# Patient Record
Sex: Male | Born: 1935 | Race: Black or African American | Hispanic: No | Smoking: Former smoker
Health system: Southern US, Community
[De-identification: ages and names within clinical notes are randomized; demographics above are authoritative.]

## PROBLEM LIST (undated history)

## (undated) DIAGNOSIS — K649 Unspecified hemorrhoids: Secondary | ICD-10-CM

## (undated) DIAGNOSIS — I471 Supraventricular tachycardia, unspecified: Secondary | ICD-10-CM

## (undated) DIAGNOSIS — E079 Disorder of thyroid, unspecified: Secondary | ICD-10-CM

## (undated) DIAGNOSIS — K219 Gastro-esophageal reflux disease without esophagitis: Secondary | ICD-10-CM

## (undated) DIAGNOSIS — M1990 Unspecified osteoarthritis, unspecified site: Secondary | ICD-10-CM

## (undated) DIAGNOSIS — I10 Essential (primary) hypertension: Secondary | ICD-10-CM

## (undated) DIAGNOSIS — E785 Hyperlipidemia, unspecified: Secondary | ICD-10-CM

## (undated) DIAGNOSIS — R42 Dizziness and giddiness: Secondary | ICD-10-CM

## (undated) HISTORY — DX: Supraventricular tachycardia, unspecified: I47.10

## (undated) HISTORY — DX: Essential (primary) hypertension: I10

## (undated) HISTORY — DX: Unspecified osteoarthritis, unspecified site: M19.90

## (undated) HISTORY — DX: Hyperlipidemia, unspecified: E78.5

## (undated) HISTORY — DX: Supraventricular tachycardia: I47.1

## (undated) HISTORY — DX: Dizziness and giddiness: R42

## (undated) HISTORY — PX: EXCISIONAL HEMORRHOIDECTOMY: SHX1541

## (undated) HISTORY — DX: Disorder of thyroid, unspecified: E07.9

## (undated) HISTORY — PX: BACK SURGERY: SHX140

## (undated) HISTORY — DX: Unspecified hemorrhoids: K64.9

## (undated) HISTORY — DX: Gastro-esophageal reflux disease without esophagitis: K21.9

---

## 1966-12-29 HISTORY — PX: APPENDECTOMY: SHX54

## 1982-12-29 HISTORY — PX: SPINE SURGERY: SHX786

## 1984-12-29 DIAGNOSIS — K649 Unspecified hemorrhoids: Secondary | ICD-10-CM

## 1984-12-29 HISTORY — DX: Unspecified hemorrhoids: K64.9

## 2003-11-20 ENCOUNTER — Other Ambulatory Visit: Payer: Self-pay

## 2004-01-12 ENCOUNTER — Encounter: Admission: RE | Admit: 2004-01-12 | Discharge: 2004-01-12 | Payer: Self-pay | Admitting: Neurosurgery

## 2007-01-26 ENCOUNTER — Ambulatory Visit: Payer: Self-pay | Admitting: Urology

## 2007-02-02 ENCOUNTER — Ambulatory Visit: Payer: Self-pay | Admitting: Urology

## 2007-03-11 ENCOUNTER — Ambulatory Visit: Payer: Self-pay | Admitting: Urology

## 2009-04-20 ENCOUNTER — Ambulatory Visit: Payer: Self-pay | Admitting: Internal Medicine

## 2010-09-24 ENCOUNTER — Ambulatory Visit: Payer: Self-pay | Admitting: Internal Medicine

## 2011-08-01 ENCOUNTER — Emergency Department: Payer: Self-pay | Admitting: Emergency Medicine

## 2014-05-03 ENCOUNTER — Encounter: Payer: Self-pay | Admitting: General Surgery

## 2014-05-10 ENCOUNTER — Emergency Department: Payer: Self-pay | Admitting: Emergency Medicine

## 2014-05-10 LAB — CBC WITH DIFFERENTIAL/PLATELET
Basophil #: 0.1 10*3/uL (ref 0.0–0.1)
Basophil %: 0.6 %
Eosinophil #: 0.1 10*3/uL (ref 0.0–0.7)
Eosinophil %: 0.8 %
HCT: 36.6 % — ABNORMAL LOW (ref 40.0–52.0)
HGB: 12 g/dL — ABNORMAL LOW (ref 13.0–18.0)
Lymphocyte #: 2 10*3/uL (ref 1.0–3.6)
Lymphocyte %: 18.9 %
MCH: 29.2 pg (ref 26.0–34.0)
MCHC: 32.7 g/dL (ref 32.0–36.0)
MCV: 89 fL (ref 80–100)
Monocyte #: 0.8 x10 3/mm (ref 0.2–1.0)
Monocyte %: 7.7 %
Neutrophil #: 7.5 10*3/uL — ABNORMAL HIGH (ref 1.4–6.5)
Neutrophil %: 72 %
Platelet: 136 10*3/uL — ABNORMAL LOW (ref 150–440)
RBC: 4.1 10*6/uL — ABNORMAL LOW (ref 4.40–5.90)
RDW: 13.4 % (ref 11.5–14.5)
WBC: 10.3 10*3/uL (ref 3.8–10.6)

## 2014-05-10 LAB — BASIC METABOLIC PANEL
Anion Gap: 8 (ref 7–16)
BUN: 30 mg/dL — ABNORMAL HIGH (ref 7–18)
Calcium, Total: 8.7 mg/dL (ref 8.5–10.1)
Chloride: 109 mmol/L — ABNORMAL HIGH (ref 98–107)
Co2: 23 mmol/L (ref 21–32)
Creatinine: 2.39 mg/dL — ABNORMAL HIGH (ref 0.60–1.30)
EGFR (African American): 28 — ABNORMAL LOW
EGFR (Non-African Amer.): 24 — ABNORMAL LOW
Glucose: 120 mg/dL — ABNORMAL HIGH (ref 65–99)
Osmolality: 287 (ref 275–301)
Potassium: 4.6 mmol/L (ref 3.5–5.1)
Sodium: 140 mmol/L (ref 136–145)

## 2014-05-30 ENCOUNTER — Encounter: Payer: Self-pay | Admitting: General Surgery

## 2014-05-30 ENCOUNTER — Ambulatory Visit (INDEPENDENT_AMBULATORY_CARE_PROVIDER_SITE_OTHER): Payer: Commercial Managed Care - HMO | Admitting: General Surgery

## 2014-05-30 VITALS — BP 174/84 | HR 80 | Resp 14 | Ht 73.0 in | Wt 183.0 lb

## 2014-05-30 DIAGNOSIS — K4090 Unilateral inguinal hernia, without obstruction or gangrene, not specified as recurrent: Secondary | ICD-10-CM

## 2014-05-30 NOTE — Progress Notes (Signed)
Patient ID: Glenn Ehrich Sr., male   DOB: 1927-03-24, 80 y.o.   MRN: [id number]  Chief Complaint  Patient presents with  . Other    New Pt evaluation of inguinal hernia    HPI Glenn BOEHNKE Sr. is a 80 y.o. male here today for an evalution of a right inguinal hernia. Patient states he noticed this about 1-2 months ago. 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  He states there is a burning pain with activity increased activity. He fell about two weeks about and landed on his right leg. Was seen in the ER and no broken bones. No problems with moving his bowels. The patient is accompanied today by his daughter, Alan Mulder. She was present for the interview and post exam discussion. HPI  Past Medical History  Diagnosis Date  . Hemorrhoid 1986  . Thyroid disease   . Hyperlipidemia   . GERD (gastroesophageal reflux disease)   . Hypertension   . Arthritis   . Vertigo     Past Surgical History  Procedure Laterality Date  . Appendectomy  1967  . Spine surgery  1984  . Excisional hemorrhoidectomy      History reviewed. No pertinent family history.  Social History History  Substance Use Topics  . Smoking status: Former Smoker -- 1.00 packs/day for 30 years    Types: Cigarettes  . Smokeless tobacco: Never Used  . Alcohol Use: No    No Known Allergies  Current Outpatient Prescriptions  Medication Sig Dispense Refill  . amLODipine (NORVASC) 5 MG tablet Take 5 mg by mouth daily.      . carisoprodol (SOMA) 350 MG tablet Take 350 mg by mouth daily.      . hydrochlorothiazide (MICROZIDE) 12.5 MG capsule Take 12.5 mg by mouth daily.      Marland Kitchen losartan (COZAAR) 100 MG tablet Take 100 mg by mouth daily.      . meclizine (ANTIVERT) 12.5 MG tablet Take 12.5 mg by mouth daily.      Marland Kitchen omeprazole (PRILOSEC) 20 MG capsule Take 20 mg by mouth daily.      . piroxicam (FELDENE) 20 MG capsule Take 20 mg by mouth daily.       No  current facility-administered medications for this visit.    Review of Systems Review of Systems  Constitutional: Negative.   Respiratory: Negative.   Cardiovascular: Negative.     Blood pressure 174/84, pulse 80, resp. rate 14, height 6\' 1"  (1.854 m), weight 183 lb (83.008 kg).  Physical Exam Physical Exam  Constitutional: He is oriented to person, place, and time. He appears well-developed and well-nourished.  Eyes: Conjunctivae are normal. No scleral icterus.  Neck: Neck supple.  Cardiovascular: Normal rate, regular rhythm and normal heart sounds.   Pulmonary/Chest: Effort normal and breath sounds normal.  Abdominal: Soft. Normal appearance and bowel sounds are normal. There is no hepatomegaly. There is no tenderness. A hernia is present. Hernia confirmed positive in the right inguinal area.  Genitourinary: Testes normal and penis normal.  Neurological: He is alert and oriented to person, place, and time.  Skin: Skin is warm and dry.    Data Reviewed Laboratory studies completed through his PCP office on April 12, 2014 showed an abnormal creatinine at 2.2 with an estimated GFR of 30. Normal electrolytes. Normal liver function studies. Hemoglobin 12.7 with an MCV of 88 white blood cell count of 7000.  PCP notes of  same-day reviewed. Fullness in the right inguinal canal was noted at this time.  Assessment    Right inguinal hernia, minimally symptomatic.     Plan    This patient is incredibly healthy and active. 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 The patient assures me that he'll contact the office directly and promptly if he notices increasing swelling or pain. It is otherwise doing well will plan to get together and fall to discuss elective repair.     PCP: Lelan Pons Byrnett 05/30/2014, 8:51 PM

## 2014-05-30 NOTE — Patient Instructions (Addendum)

## 2014-10-11 ENCOUNTER — Ambulatory Visit: Payer: Commercial Managed Care - HMO | Admitting: General Surgery

## 2014-10-16 IMAGING — CT CT PELVIS W/O CM
2 of 4 series · 16 of 46 positions shown, 18 images · non-contrast
Comparison: DG PELVIS 1-2V dated 05/10/2014

CLINICAL DATA: Right hip pain after trauma.

EXAM:
CT PELVIS WITHOUT CONTRAST
TECHNIQUE: Multidetector CT imaging of the pelvis was performed following the
standard protocol without intravenous contrast.

[Series 3: pelvis soft tissue · axial · 0.72mm/px · z∈[+790,+1000]mm · 13 of 160 slices shown, 15 images]
[im 10/160  soft-tissue]
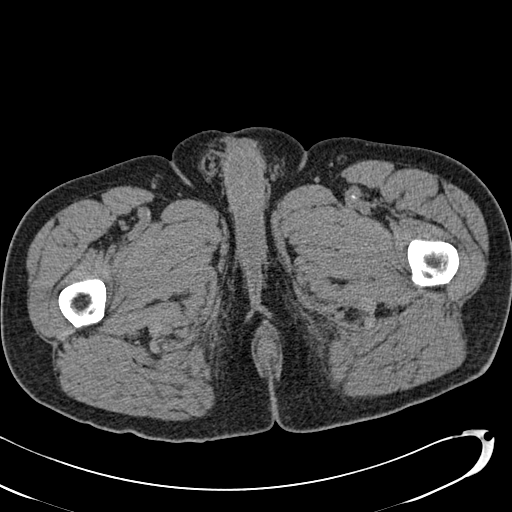
[im 10/160  bone]
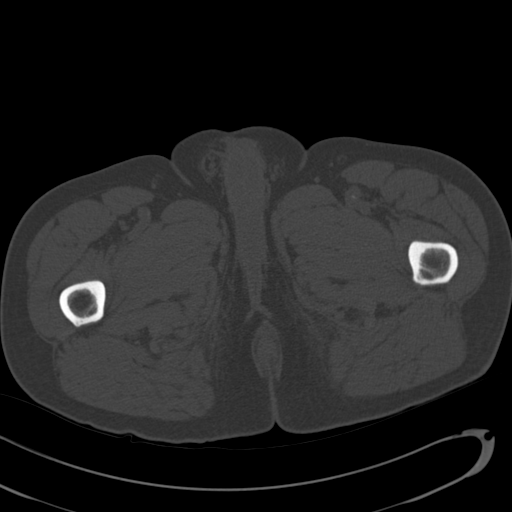
[im 19/160  soft-tissue]
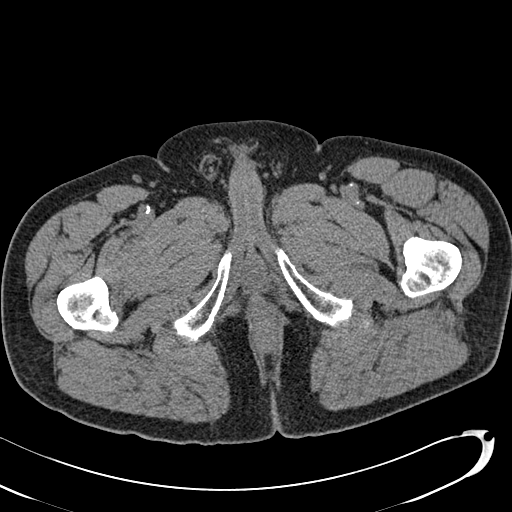
[im 38/160  soft-tissue]
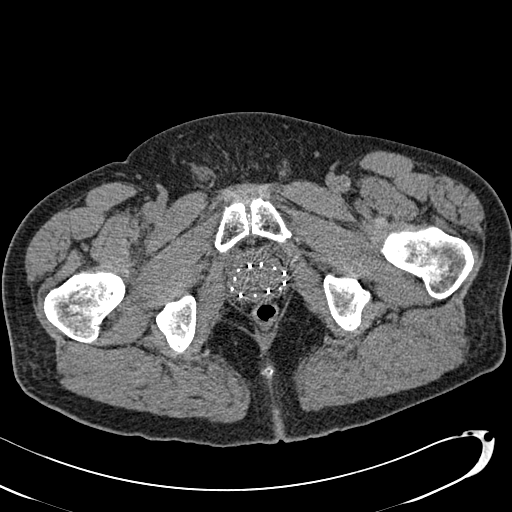
[im 47/160  soft-tissue]
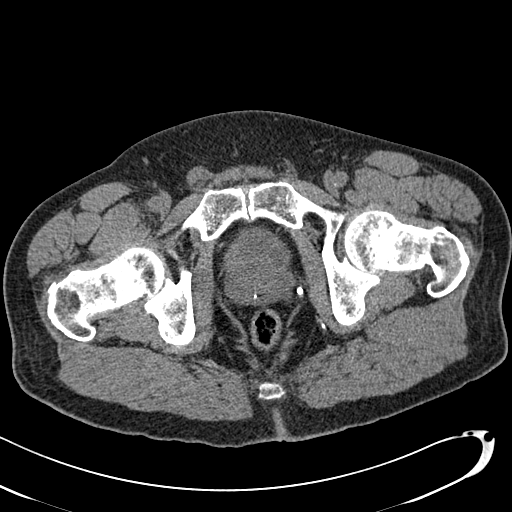
[im 57/160  soft-tissue]
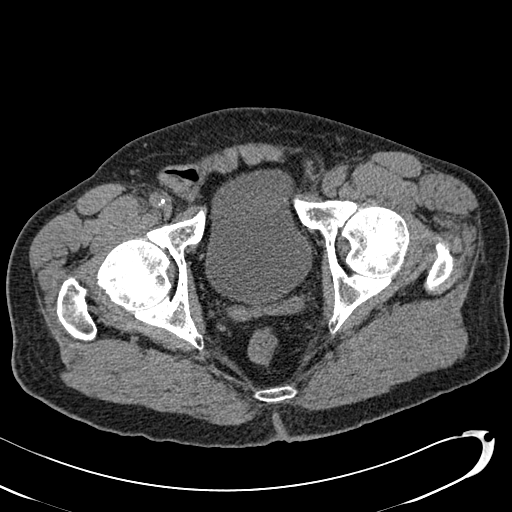
[im 66/160  soft-tissue]
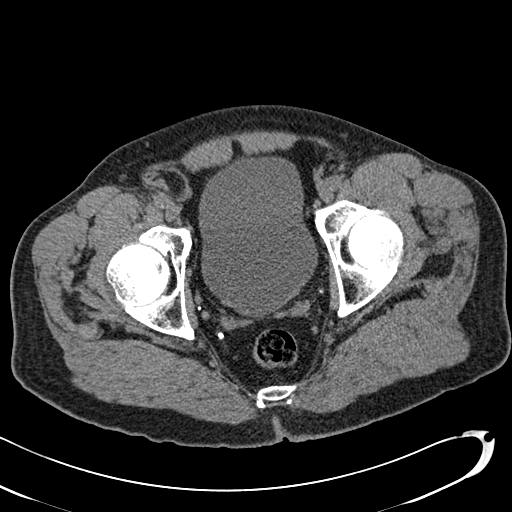
[im 85/160  soft-tissue]
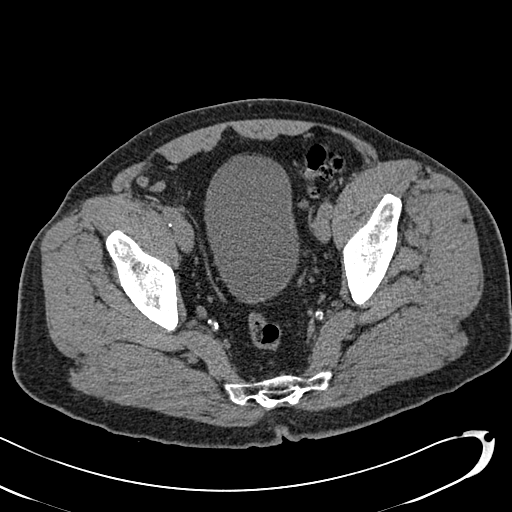
[im 94/160  soft-tissue]
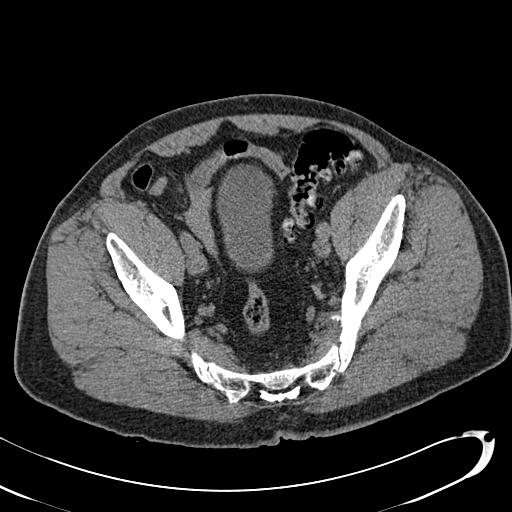
[im 103/160  soft-tissue]
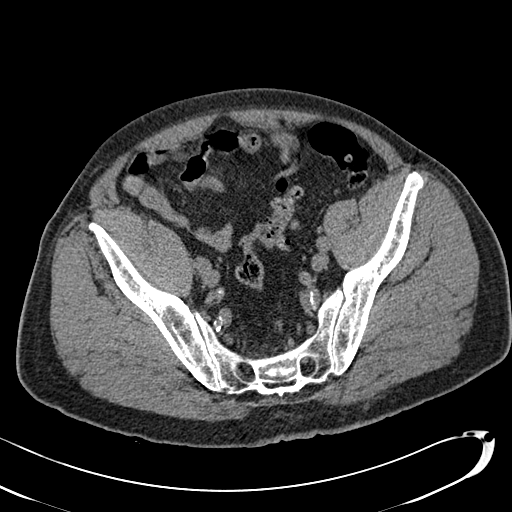
[im 103/160  bone]
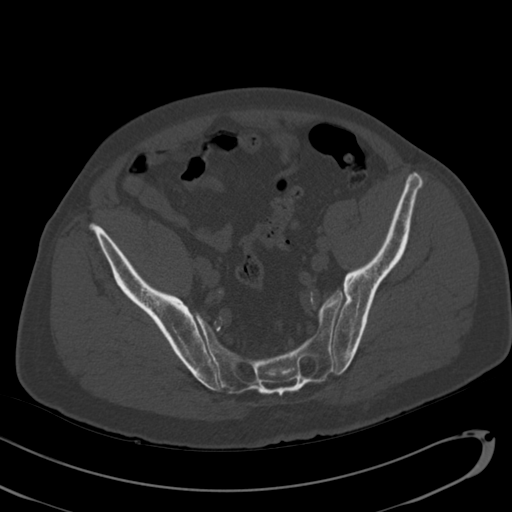
[im 113/160  soft-tissue]
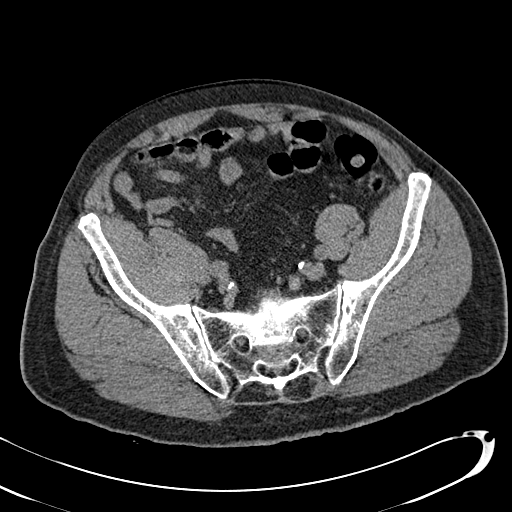
[im 122/160  soft-tissue]
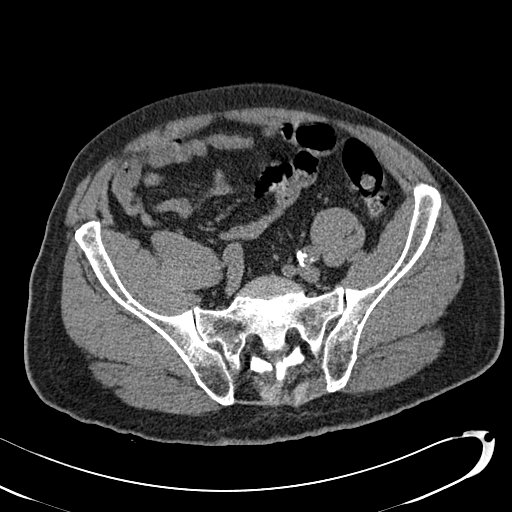
[im 141/160  soft-tissue]
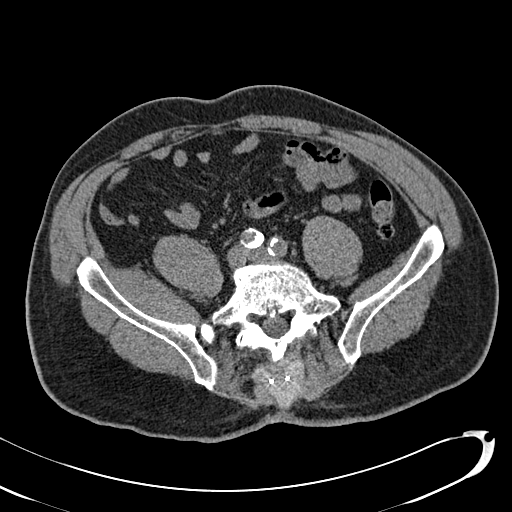
[im 150/160  soft-tissue]
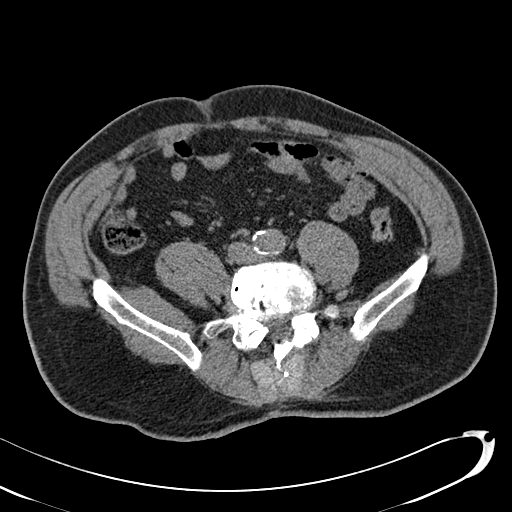

[Series 6: pevis soft tissue cor · coronal · 0.52mm/px · 3 of 123 slices shown]
[im 41/123  soft-tissue]
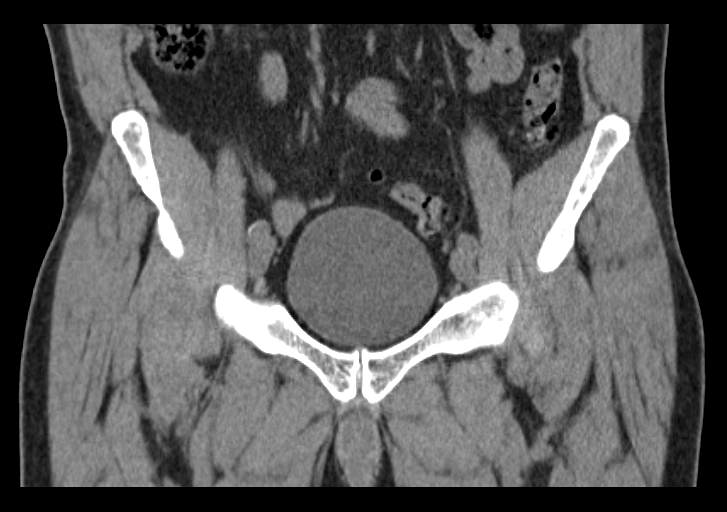
[im 55/123  soft-tissue]
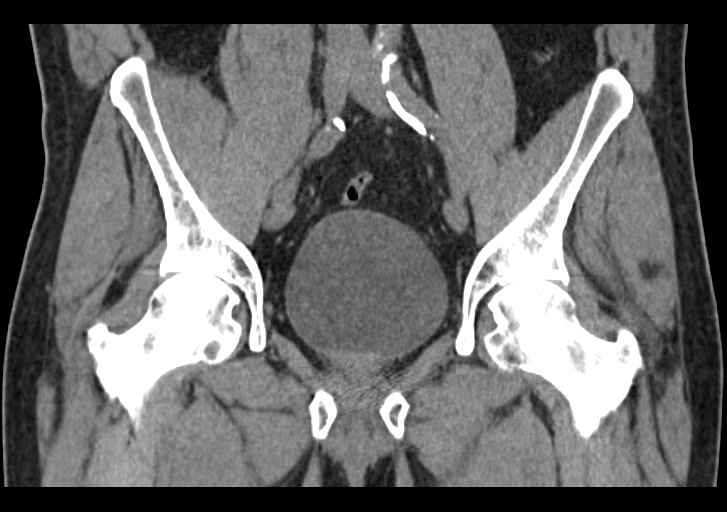
[im 68/123  soft-tissue]
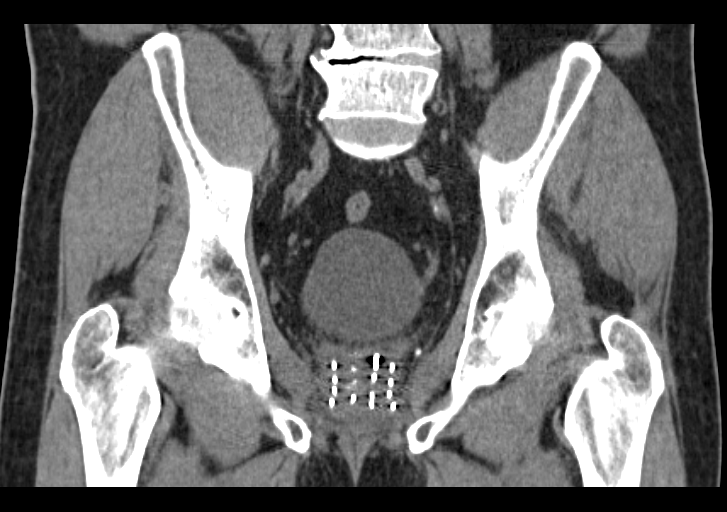

[16 of 46 positions shown; findings below may reference images not displayed]

FINDINGS: The pelvis and hips appear intact. No displaced fractures are
identified. No focal bone lesions. There is a small right inguinal
hernia containing bowel loop. No evidence of proximal bowel
distention to suggest obstruction. Bladder wall is not thickened.
Multiple foreign bodies in the prostate gland consistent with seed
implants. No significant lymphadenopathy in the pelvis. Common iliac
and external/internal iliac artery calcifications.
IMPRESSION: No evidence of acute fracture of the pelvis or hips. Right inguinal
hernia containing bowel without proximal obstruction.

## 2014-10-17 ENCOUNTER — Ambulatory Visit: Payer: Commercial Managed Care - HMO | Admitting: General Surgery

## 2014-10-31 ENCOUNTER — Encounter: Payer: Self-pay | Admitting: *Deleted

## 2015-01-18 DIAGNOSIS — I10 Essential (primary) hypertension: Secondary | ICD-10-CM | POA: Diagnosis not present

## 2015-01-18 DIAGNOSIS — N183 Chronic kidney disease, stage 3 (moderate): Secondary | ICD-10-CM | POA: Diagnosis not present

## 2015-01-18 DIAGNOSIS — R809 Proteinuria, unspecified: Secondary | ICD-10-CM | POA: Diagnosis not present

## 2015-01-29 ENCOUNTER — Ambulatory Visit: Payer: Self-pay | Admitting: Nephrology

## 2015-01-29 DIAGNOSIS — R809 Proteinuria, unspecified: Secondary | ICD-10-CM | POA: Diagnosis not present

## 2015-01-29 DIAGNOSIS — N183 Chronic kidney disease, stage 3 (moderate): Secondary | ICD-10-CM | POA: Diagnosis not present

## 2015-02-02 DIAGNOSIS — I129 Hypertensive chronic kidney disease with stage 1 through stage 4 chronic kidney disease, or unspecified chronic kidney disease: Secondary | ICD-10-CM | POA: Diagnosis not present

## 2015-02-02 DIAGNOSIS — E039 Hypothyroidism, unspecified: Secondary | ICD-10-CM | POA: Diagnosis not present

## 2015-02-02 DIAGNOSIS — N183 Chronic kidney disease, stage 3 (moderate): Secondary | ICD-10-CM | POA: Diagnosis not present

## 2015-02-02 DIAGNOSIS — M1990 Unspecified osteoarthritis, unspecified site: Secondary | ICD-10-CM | POA: Diagnosis not present

## 2015-02-06 DIAGNOSIS — N183 Chronic kidney disease, stage 3 (moderate): Secondary | ICD-10-CM | POA: Diagnosis not present

## 2015-02-06 DIAGNOSIS — I10 Essential (primary) hypertension: Secondary | ICD-10-CM | POA: Diagnosis not present

## 2015-02-06 DIAGNOSIS — D631 Anemia in chronic kidney disease: Secondary | ICD-10-CM | POA: Diagnosis not present

## 2015-02-06 DIAGNOSIS — R809 Proteinuria, unspecified: Secondary | ICD-10-CM | POA: Diagnosis not present

## 2015-06-01 ENCOUNTER — Other Ambulatory Visit: Payer: Self-pay | Admitting: Family Medicine

## 2015-06-01 DIAGNOSIS — R609 Edema, unspecified: Secondary | ICD-10-CM

## 2015-06-01 DIAGNOSIS — M1990 Unspecified osteoarthritis, unspecified site: Secondary | ICD-10-CM

## 2015-07-03 ENCOUNTER — Other Ambulatory Visit: Payer: Self-pay | Admitting: Family Medicine

## 2015-07-03 ENCOUNTER — Other Ambulatory Visit: Payer: Self-pay

## 2015-07-03 DIAGNOSIS — E039 Hypothyroidism, unspecified: Secondary | ICD-10-CM

## 2015-07-05 DIAGNOSIS — G4700 Insomnia, unspecified: Secondary | ICD-10-CM | POA: Insufficient documentation

## 2015-07-05 DIAGNOSIS — R202 Paresthesia of skin: Secondary | ICD-10-CM | POA: Insufficient documentation

## 2015-07-05 DIAGNOSIS — N183 Chronic kidney disease, stage 3 unspecified: Secondary | ICD-10-CM | POA: Insufficient documentation

## 2015-07-05 DIAGNOSIS — M109 Gout, unspecified: Secondary | ICD-10-CM | POA: Insufficient documentation

## 2015-07-05 DIAGNOSIS — M545 Low back pain, unspecified: Secondary | ICD-10-CM | POA: Insufficient documentation

## 2015-07-05 DIAGNOSIS — I10 Essential (primary) hypertension: Secondary | ICD-10-CM | POA: Insufficient documentation

## 2015-07-05 DIAGNOSIS — J309 Allergic rhinitis, unspecified: Secondary | ICD-10-CM | POA: Insufficient documentation

## 2015-07-05 DIAGNOSIS — C61 Malignant neoplasm of prostate: Secondary | ICD-10-CM | POA: Insufficient documentation

## 2015-07-05 DIAGNOSIS — M1990 Unspecified osteoarthritis, unspecified site: Secondary | ICD-10-CM | POA: Insufficient documentation

## 2015-07-05 DIAGNOSIS — E039 Hypothyroidism, unspecified: Secondary | ICD-10-CM | POA: Insufficient documentation

## 2015-07-05 DIAGNOSIS — K4090 Unilateral inguinal hernia, without obstruction or gangrene, not specified as recurrent: Secondary | ICD-10-CM | POA: Insufficient documentation

## 2015-07-05 DIAGNOSIS — R252 Cramp and spasm: Secondary | ICD-10-CM | POA: Insufficient documentation

## 2015-07-05 DIAGNOSIS — E785 Hyperlipidemia, unspecified: Secondary | ICD-10-CM | POA: Insufficient documentation

## 2015-07-05 DIAGNOSIS — K219 Gastro-esophageal reflux disease without esophagitis: Secondary | ICD-10-CM | POA: Insufficient documentation

## 2015-07-05 DIAGNOSIS — R21 Rash and other nonspecific skin eruption: Secondary | ICD-10-CM | POA: Insufficient documentation

## 2015-07-05 DIAGNOSIS — R42 Dizziness and giddiness: Secondary | ICD-10-CM | POA: Insufficient documentation

## 2015-07-13 ENCOUNTER — Ambulatory Visit: Payer: Self-pay | Admitting: Family Medicine

## 2015-07-17 ENCOUNTER — Ambulatory Visit (INDEPENDENT_AMBULATORY_CARE_PROVIDER_SITE_OTHER): Payer: Commercial Managed Care - HMO | Admitting: Family Medicine

## 2015-07-17 ENCOUNTER — Encounter: Payer: Self-pay | Admitting: Family Medicine

## 2015-07-17 VITALS — BP 126/72 | HR 72 | Temp 97.9°F | Resp 16 | Ht 70.0 in | Wt 173.0 lb

## 2015-07-17 DIAGNOSIS — R609 Edema, unspecified: Secondary | ICD-10-CM

## 2015-07-17 DIAGNOSIS — E039 Hypothyroidism, unspecified: Secondary | ICD-10-CM

## 2015-07-17 DIAGNOSIS — R0602 Shortness of breath: Secondary | ICD-10-CM

## 2015-07-17 DIAGNOSIS — R630 Anorexia: Secondary | ICD-10-CM

## 2015-07-17 NOTE — Progress Notes (Signed)
Subjective:    Patient ID: Glenn Costain., male    DOB: November 17, 1927, 81 y.o.   MRN: [id number]  Hypertension This is a chronic problem. The problem is controlled. Associated symptoms include malaise/fatigue, peripheral edema and shortness of breath (on exertion). Pertinent negatives include no anxiety, blurred vision, chest pain, headaches, neck pain, orthopnea or palpitations. There are no compliance problems.   Hypothyroidism Last TSH was checked 02/02/2015. Result was 9.940. Pt was NOT taking Synthroid at that time. Pt needs TSH rechecked today, as well a Met C for HTN with CKD III. Loss Of Appetite Pt reports he is experiencing loss of appetite, and is requesting medication to help this.   Review of Systems  Constitutional: Positive for malaise/fatigue, activity change, appetite change, fatigue and unexpected weight change. Negative for fever, chills and diaphoresis.  Eyes: Negative for blurred vision.  Respiratory: Positive for shortness of breath (on exertion).   Cardiovascular: Negative for chest pain, palpitations and orthopnea.  Musculoskeletal: Negative for neck pain.  Neurological: Negative for headaches.   BP 126/72 mmHg  Pulse 72  Temp(Src) 97.9 F (36.6 C) (Oral)  Resp 16  Ht 5' 10" (1.778 m)  Wt 173 lb (78.472 kg)  BMI 24.82 kg/m2   Patient Active Problem List   Diagnosis Date Noted  . Allergic rhinitis 07/05/2015  . Arthritis 07/05/2015  . Benign hypertension 07/05/2015  . Chronic kidney disease (CKD), stage III (moderate) 07/05/2015  . Acid reflux 07/05/2015  . Gout 07/05/2015  . HLD (hyperlipidemia) 07/05/2015  . Adult hypothyroidism 07/05/2015  . Cannot sleep 07/05/2015  . Hernia, inguinal 07/05/2015  . LBP (low back pain) 07/05/2015  . Cramp in muscle 07/05/2015  . Leg paresthesia 07/05/2015  . CA of prostate 07/05/2015  . Cutaneous eruption 07/05/2015  . Head revolving around 07/05/2015  . Hypothyroidism 07/03/2015  . Right inguinal hernia  05/30/2014   Past Medical History  Diagnosis Date  . Hemorrhoid 1986  . Thyroid disease   . Hyperlipidemia   . GERD (gastroesophageal reflux disease)   . Hypertension   . Arthritis   . Vertigo    Current Outpatient Prescriptions on File Prior to Visit  Medication Sig  . amLODipine (NORVASC) 10 MG tablet   . furosemide (LASIX) 20 MG tablet take 1 tablet by mouth once daily  . levothyroxine (SYNTHROID, LEVOTHROID) 50 MCG tablet take 1 tablet by mouth once daily  . losartan (COZAAR) 100 MG tablet Take 100 mg by mouth daily.  . piroxicam (FELDENE) 20 MG capsule   . ranitidine (ZANTAC) 150 MG tablet Take by mouth.  . carisoprodol (SOMA) 350 MG tablet Take by mouth.  . colchicine (COLCRYS) 0.6 MG tablet Take by mouth.  . triamcinolone (KENALOG) 0.025 % cream TRIAMCINOLONE ACETONIDE, 0.025% (External Cream)  1 (one) Cream Cream apply daily as needed for 0 days  Quantity: 60;  Refills: 0   Ordered :13-Apr-2014  [name] ;  Started 13-Apr-2014 Active Comments: Medication taken as needed.    No current facility-administered medications on file prior to visit.   Allergies  Allergen Reactions  . Codeine   . Cortizone-10  [Hydrocortisone]    Past Surgical History  Procedure Laterality Date  . Spine surgery  1984  . Excisional hemorrhoidectomy    . Appendectomy  1968   History   Social History  . Marital Status: Widowed    Spouse Name: N/A  . Number of Children: N/A  . Years of Education: N/A   Occupational History  .  Not on file.   Social History Main Topics  . Smoking status: Former Smoker -- 2.00 packs/day for 25 years    Quit date: 12/28/1965  . Smokeless tobacco: Never Used  . Alcohol Use: No  . Drug Use: No  . Sexual Activity: Not on file   Other Topics Concern  . Not on file   Social History Narrative   Family History  Problem Relation Age of Onset  . Alzheimer's disease Father   . Healthy Sister       Objective:   Physical Exam   Constitutional: He is oriented to person, place, and time. He appears well-developed and well-nourished.  Cardiovascular: Normal rate and regular rhythm.   Pulmonary/Chest: Effort normal and breath sounds normal.  Neurological: He is alert and oriented to person, place, and time.  Psychiatric: He has a normal mood and affect. His behavior is normal. Judgment and thought content normal.    BP 126/72 mmHg  Pulse 72  Temp(Src) 97.9 F (36.6 C) (Oral)  Resp 16  Ht 5' 10" (1.778 m)  Wt 173 lb (78.472 kg)  BMI 24.82 kg/m2     Assessment & Plan:  1. Edema Comes and goes, will check labs. Further plan pending these results.  - TSH - CBC with Differential/Platelet - Comprehensive metabolic panel  2. Shortness of breath Will check to make sure not developing heart failure. Difficult to assess.  - B Nat Peptide  3. Anorexia Family concerned. Will check pre-albumin.  - Prealbumin  4. Hypothyroidism, unspecified hypothyroidism type Stable. Will check labs.  - TSH  [name], MD

## 2015-07-18 ENCOUNTER — Telehealth: Payer: Self-pay

## 2015-07-18 DIAGNOSIS — N183 Chronic kidney disease, stage 3 unspecified: Secondary | ICD-10-CM

## 2015-07-18 LAB — CBC WITH DIFFERENTIAL/PLATELET
Basophils Absolute: 0 10*3/uL (ref 0.0–0.2)
Basos: 0 %
EOS (ABSOLUTE): 0.1 10*3/uL (ref 0.0–0.4)
Eos: 1 %
HEMATOCRIT: 37.9 % (ref 37.5–51.0)
Hemoglobin: 12.8 g/dL (ref 12.6–17.7)
Immature Grans (Abs): 0 10*3/uL (ref 0.0–0.1)
Immature Granulocytes: 0 %
Lymphocytes Absolute: 2.8 10*3/uL (ref 0.7–3.1)
Lymphs: 37 %
MCH: 29.8 pg (ref 26.6–33.0)
MCHC: 33.8 g/dL (ref 31.5–35.7)
MCV: 88 fL (ref 79–97)
MONOCYTES: 9 %
Monocytes Absolute: 0.7 10*3/uL (ref 0.1–0.9)
Neutrophils Absolute: 4 10*3/uL (ref 1.4–7.0)
Neutrophils: 53 %
Platelets: 137 10*3/uL — ABNORMAL LOW (ref 150–379)
RBC: 4.3 x10E6/uL (ref 4.14–5.80)
RDW: 13.7 % (ref 12.3–15.4)
WBC: 7.7 10*3/uL (ref 3.4–10.8)

## 2015-07-18 LAB — COMPREHENSIVE METABOLIC PANEL
ALT: 17 IU/L (ref 0–44)
AST: 24 IU/L (ref 0–40)
Albumin/Globulin Ratio: 1.3 (ref 1.1–2.5)
Albumin: 4.3 g/dL (ref 3.5–4.7)
Alkaline Phosphatase: 106 IU/L (ref 39–117)
BUN/Creatinine Ratio: 13 (ref 10–22)
BUN: 38 mg/dL — ABNORMAL HIGH (ref 8–27)
Bilirubin Total: 0.4 mg/dL (ref 0.0–1.2)
CO2: 23 mmol/L (ref 18–29)
Calcium: 9.3 mg/dL (ref 8.6–10.2)
Chloride: 99 mmol/L (ref 97–108)
Creatinine, Ser: 2.87 mg/dL — ABNORMAL HIGH (ref 0.76–1.27)
GFR calc Af Amer: 22 mL/min/{1.73_m2} — ABNORMAL LOW (ref >59)
GFR calc non Af Amer: 19 mL/min/{1.73_m2} — ABNORMAL LOW (ref >59)
Globulin, Total: 3.3 g/dL (ref 1.5–4.5)
Glucose: 94 mg/dL (ref 65–99)
POTASSIUM: 5.5 mmol/L — AB (ref 3.5–5.2)
Sodium: 137 mmol/L (ref 134–144)
Total Protein: 7.6 g/dL (ref 6.0–8.5)

## 2015-07-18 LAB — TSH: TSH: 4.45 u[IU]/mL (ref 0.450–4.500)

## 2015-07-18 LAB — BRAIN NATRIURETIC PEPTIDE: BNP: 22.7 pg/mL (ref 0.0–100.0)

## 2015-07-18 LAB — PREALBUMIN: PREALBUMIN: 28 mg/dL (ref 9–32)

## 2015-07-18 NOTE — Telephone Encounter (Signed)
-----   Message from [name], MD sent at 07/18/2015  3:28 PM EDT ----- Labs stable except that kidney function is worsening, and potassium is up.  Needs to follow up with nephrologist. Heart ok.   Labs do not show malnutrition. Please notify daughter. Thanks.

## 2015-07-18 NOTE — Telephone Encounter (Signed)
Yolanda advised as directed below.  Please refer back to Nephrology.    Thanks,   -Mickel Baas

## 2015-07-27 ENCOUNTER — Telehealth: Payer: Self-pay | Admitting: Family Medicine

## 2015-07-27 NOTE — Telephone Encounter (Signed)
Pt's daughter Reita May called because she had a missed call from our office and thought maybe Mickel Baas had tried calling her back. Thanks TNP

## 2015-07-27 NOTE — Telephone Encounter (Signed)
Left message for Glenn Jarvis to return call

## 2015-07-27 NOTE — Telephone Encounter (Signed)
Reita May called back and stated that she thinks Judson Roch tried to call not Mickel Baas. Thanks TNP

## 2015-08-01 DIAGNOSIS — I10 Essential (primary) hypertension: Secondary | ICD-10-CM | POA: Diagnosis not present

## 2015-08-01 DIAGNOSIS — N183 Chronic kidney disease, stage 3 (moderate): Secondary | ICD-10-CM | POA: Diagnosis not present

## 2015-08-01 DIAGNOSIS — D631 Anemia in chronic kidney disease: Secondary | ICD-10-CM | POA: Diagnosis not present

## 2015-08-01 DIAGNOSIS — R809 Proteinuria, unspecified: Secondary | ICD-10-CM | POA: Diagnosis not present

## 2015-08-09 DIAGNOSIS — D631 Anemia in chronic kidney disease: Secondary | ICD-10-CM | POA: Diagnosis not present

## 2015-08-09 DIAGNOSIS — N183 Chronic kidney disease, stage 3 (moderate): Secondary | ICD-10-CM | POA: Diagnosis not present

## 2015-08-09 DIAGNOSIS — R809 Proteinuria, unspecified: Secondary | ICD-10-CM | POA: Diagnosis not present

## 2015-08-09 DIAGNOSIS — I10 Essential (primary) hypertension: Secondary | ICD-10-CM | POA: Diagnosis not present

## 2015-09-11 ENCOUNTER — Other Ambulatory Visit: Payer: Self-pay

## 2015-09-11 ENCOUNTER — Telehealth: Payer: Self-pay | Admitting: Family Medicine

## 2015-09-11 DIAGNOSIS — I10 Essential (primary) hypertension: Secondary | ICD-10-CM

## 2015-09-11 DIAGNOSIS — G4700 Insomnia, unspecified: Secondary | ICD-10-CM

## 2015-09-11 MED ORDER — CARISOPRODOL 350 MG PO TABS: 350.0000 mg | ORAL_TABLET | Freq: Every day | ORAL | Status: DC

## 2015-09-11 MED ORDER — AMLODIPINE BESYLATE 10 MG PO TABS: 10.0000 mg | ORAL_TABLET | Freq: Two times a day (BID) | ORAL | Status: DC

## 2015-09-11 NOTE — Telephone Encounter (Signed)
Ok to put in order for Amlodipine 10 mg bid with 3 rf.   And Soma generic 350 every evening.  Thanks.

## 2015-09-11 NOTE — Telephone Encounter (Signed)
Glenn Jarvis with [name] would like a nurse to return his call. Clair Gulling stated that pt has had [facility] to transfer his prescriptions to [facility] and all of them came over except for a muscle relaxer. Clair Gulling stated that he didn't know the name of it but wanted to see if it is a regular prescription that doesn't have refills or if it was just temporary. Thanks TNP

## 2015-09-11 NOTE — Telephone Encounter (Signed)
Spoke with Clair Gulling pharmacist from [facility], he stated that pt's family is requesting for his medication to be refilled at this pharmacy, he stated that pt's daughter stated that he needs the muscle relaxant medication which she could not remember the name, he also said that his daughter stated that he takes 3 B/P medications which he was missing the amlodipine medication also. In the allscripts it shows that he takes amlodipine 10 mg BID? I also verified the amlodipine dosage and how often with the pharmacist at [facility] which she said that directions for the amlodipine 10 mg is BID.  Thanks,

## 2015-09-11 NOTE — Telephone Encounter (Signed)
Sent prescriptions to pharmacy as ordered per MD.  Thanks,

## 2015-10-04 ENCOUNTER — Other Ambulatory Visit: Payer: Self-pay | Admitting: Family Medicine

## 2015-10-04 DIAGNOSIS — I10 Essential (primary) hypertension: Secondary | ICD-10-CM

## 2015-10-04 DIAGNOSIS — E039 Hypothyroidism, unspecified: Secondary | ICD-10-CM

## 2015-10-04 DIAGNOSIS — R609 Edema, unspecified: Secondary | ICD-10-CM

## 2015-10-05 DIAGNOSIS — R609 Edema, unspecified: Secondary | ICD-10-CM | POA: Insufficient documentation

## 2015-10-15 ENCOUNTER — Ambulatory Visit: Payer: Commercial Managed Care - HMO | Admitting: Family Medicine

## 2015-12-12 ENCOUNTER — Other Ambulatory Visit: Payer: Self-pay

## 2015-12-12 DIAGNOSIS — M109 Gout, unspecified: Secondary | ICD-10-CM

## 2015-12-12 NOTE — Telephone Encounter (Signed)
Yolanda called; her father is having a gout flare and would like a refill of colocrys sent to [facility].  Needs to be called in and please advised the pharmacist this needs to be delivered.   Thanks,   -Mickel Baas

## 2015-12-13 MED ORDER — COLCHICINE 0.6 MG PO TABS: 0.6000 mg | ORAL_TABLET | Freq: Two times a day (BID) | ORAL | Status: DC

## 2016-02-25 ENCOUNTER — Other Ambulatory Visit: Payer: Self-pay | Admitting: Family Medicine

## 2016-02-25 DIAGNOSIS — I10 Essential (primary) hypertension: Secondary | ICD-10-CM

## 2016-02-25 DIAGNOSIS — M1990 Unspecified osteoarthritis, unspecified site: Secondary | ICD-10-CM

## 2016-03-16 ENCOUNTER — Emergency Department: Payer: Commercial Managed Care - HMO

## 2016-03-16 ENCOUNTER — Encounter: Payer: Self-pay | Admitting: *Deleted

## 2016-03-16 DIAGNOSIS — Z820 Family history of epilepsy and other diseases of the nervous system: Secondary | ICD-10-CM

## 2016-03-16 DIAGNOSIS — Z9049 Acquired absence of other specified parts of digestive tract: Secondary | ICD-10-CM | POA: Diagnosis not present

## 2016-03-16 DIAGNOSIS — I10 Essential (primary) hypertension: Secondary | ICD-10-CM | POA: Diagnosis not present

## 2016-03-16 DIAGNOSIS — I129 Hypertensive chronic kidney disease with stage 1 through stage 4 chronic kidney disease, or unspecified chronic kidney disease: Secondary | ICD-10-CM | POA: Diagnosis present

## 2016-03-16 DIAGNOSIS — K219 Gastro-esophageal reflux disease without esophagitis: Secondary | ICD-10-CM | POA: Diagnosis present

## 2016-03-16 DIAGNOSIS — Z87891 Personal history of nicotine dependence: Secondary | ICD-10-CM | POA: Diagnosis not present

## 2016-03-16 DIAGNOSIS — Z9889 Other specified postprocedural states: Secondary | ICD-10-CM

## 2016-03-16 DIAGNOSIS — Z888 Allergy status to other drugs, medicaments and biological substances status: Secondary | ICD-10-CM | POA: Diagnosis not present

## 2016-03-16 DIAGNOSIS — N183 Chronic kidney disease, stage 3 (moderate): Secondary | ICD-10-CM | POA: Diagnosis present

## 2016-03-16 DIAGNOSIS — Y842 Radiological procedure and radiotherapy as the cause of abnormal reaction of the patient, or of later complication, without mention of misadventure at the time of the procedure: Secondary | ICD-10-CM | POA: Diagnosis present

## 2016-03-16 DIAGNOSIS — M1990 Unspecified osteoarthritis, unspecified site: Secondary | ICD-10-CM | POA: Diagnosis present

## 2016-03-16 DIAGNOSIS — K4090 Unilateral inguinal hernia, without obstruction or gangrene, not specified as recurrent: Secondary | ICD-10-CM | POA: Diagnosis not present

## 2016-03-16 DIAGNOSIS — Z8546 Personal history of malignant neoplasm of prostate: Secondary | ICD-10-CM | POA: Diagnosis not present

## 2016-03-16 DIAGNOSIS — N400 Enlarged prostate without lower urinary tract symptoms: Secondary | ICD-10-CM | POA: Diagnosis present

## 2016-03-16 DIAGNOSIS — Z886 Allergy status to analgesic agent status: Secondary | ICD-10-CM | POA: Diagnosis not present

## 2016-03-16 DIAGNOSIS — K4030 Unilateral inguinal hernia, with obstruction, without gangrene, not specified as recurrent: Principal | ICD-10-CM | POA: Diagnosis present

## 2016-03-16 DIAGNOSIS — D176 Benign lipomatous neoplasm of spermatic cord: Secondary | ICD-10-CM | POA: Diagnosis present

## 2016-03-16 DIAGNOSIS — E039 Hypothyroidism, unspecified: Secondary | ICD-10-CM | POA: Diagnosis not present

## 2016-03-16 LAB — URINALYSIS COMPLETE WITH MICROSCOPIC (ARMC ONLY)
Bacteria, UA: NONE SEEN
Bilirubin Urine: NEGATIVE
Glucose, UA: NEGATIVE mg/dL
HGB URINE DIPSTICK: NEGATIVE
KETONES UR: NEGATIVE mg/dL
LEUKOCYTES UA: NEGATIVE
NITRITE: NEGATIVE
PH: 5 (ref 5.0–8.0)
PROTEIN: 100 mg/dL — AB
SPECIFIC GRAVITY, URINE: 1.013 (ref 1.005–1.030)
Squamous Epithelial / LPF: NONE SEEN

## 2016-03-16 LAB — COMPREHENSIVE METABOLIC PANEL
ALK PHOS: 103 U/L (ref 38–126)
ALT: 24 U/L (ref 17–63)
ANION GAP: 6 (ref 5–15)
AST: 31 U/L (ref 15–41)
Albumin: 4.1 g/dL (ref 3.5–5.0)
BUN: 34 mg/dL — ABNORMAL HIGH (ref 6–20)
CALCIUM: 9 mg/dL (ref 8.9–10.3)
CO2: 23 mmol/L (ref 22–32)
Chloride: 106 mmol/L (ref 101–111)
Creatinine, Ser: 2.31 mg/dL — ABNORMAL HIGH (ref 0.61–1.24)
GFR, EST AFRICAN AMERICAN: 28 mL/min — AB (ref >60)
GFR, EST NON AFRICAN AMERICAN: 24 mL/min — AB (ref >60)
Glucose, Bld: 117 mg/dL — ABNORMAL HIGH (ref 65–99)
Potassium: 4.1 mmol/L (ref 3.5–5.1)
SODIUM: 135 mmol/L (ref 135–145)
TOTAL PROTEIN: 8 g/dL (ref 6.5–8.1)
Total Bilirubin: 0.7 mg/dL (ref 0.3–1.2)

## 2016-03-16 LAB — CBC
HCT: 37.1 % — ABNORMAL LOW (ref 40.0–52.0)
HEMOGLOBIN: 12.6 g/dL — AB (ref 13.0–18.0)
MCH: 29.7 pg (ref 26.0–34.0)
MCHC: 34 g/dL (ref 32.0–36.0)
MCV: 87.4 fL (ref 80.0–100.0)
PLATELETS: 141 10*3/uL — AB (ref 150–440)
RBC: 4.25 MIL/uL — AB (ref 4.40–5.90)
RDW: 13.8 % (ref 11.5–14.5)
WBC: 8.9 10*3/uL (ref 3.8–10.6)

## 2016-03-16 LAB — TROPONIN I

## 2016-03-16 LAB — LIPASE, BLOOD: Lipase: 23 U/L (ref 11–51)

## 2016-03-16 NOTE — ED Notes (Signed)
Pt c/o abdominal pain, n/v, brownish vomitus. Pt has hx of abdominal hernia in past. Pt denies hx of GI bleed. Pt states his stomach swells up on R side of abdomen and is presently swollen and hurting. Pt states he ate toast and coffee. Pt accompanied by family friend who saw the vomit and stated it was blood-tinged. Pt last had BM at 1900 and characterized as normal by pt.

## 2016-03-17 ENCOUNTER — Inpatient Hospital Stay: Payer: Commercial Managed Care - HMO | Admitting: Certified Registered Nurse Anesthetist

## 2016-03-17 ENCOUNTER — Encounter
Admission: EM | Disposition: A | Discharge status: Home / Self Care | Payer: Self-pay | Source: Home / Self Care | Attending: Surgery

## 2016-03-17 ENCOUNTER — Encounter: Payer: Self-pay | Admitting: Surgery

## 2016-03-17 ENCOUNTER — Inpatient Hospital Stay
Admission: EM | Admit: 2016-03-17 | Discharge: 2016-03-18 | DRG: 352 | Disposition: A | Discharge status: Home / Self Care | Payer: Commercial Managed Care - HMO | Attending: Surgery | Admitting: Surgery

## 2016-03-17 DIAGNOSIS — D176 Benign lipomatous neoplasm of spermatic cord: Secondary | ICD-10-CM | POA: Diagnosis present

## 2016-03-17 DIAGNOSIS — K219 Gastro-esophageal reflux disease without esophagitis: Secondary | ICD-10-CM | POA: Diagnosis present

## 2016-03-17 DIAGNOSIS — Z87891 Personal history of nicotine dependence: Secondary | ICD-10-CM | POA: Diagnosis not present

## 2016-03-17 DIAGNOSIS — K4030 Unilateral inguinal hernia, with obstruction, without gangrene, not specified as recurrent: Secondary | ICD-10-CM

## 2016-03-17 DIAGNOSIS — Y842 Radiological procedure and radiotherapy as the cause of abnormal reaction of the patient, or of later complication, without mention of misadventure at the time of the procedure: Secondary | ICD-10-CM | POA: Diagnosis present

## 2016-03-17 DIAGNOSIS — Z9889 Other specified postprocedural states: Secondary | ICD-10-CM | POA: Diagnosis not present

## 2016-03-17 DIAGNOSIS — Z820 Family history of epilepsy and other diseases of the nervous system: Secondary | ICD-10-CM | POA: Diagnosis not present

## 2016-03-17 DIAGNOSIS — Z888 Allergy status to other drugs, medicaments and biological substances status: Secondary | ICD-10-CM | POA: Diagnosis not present

## 2016-03-17 DIAGNOSIS — Z9049 Acquired absence of other specified parts of digestive tract: Secondary | ICD-10-CM | POA: Diagnosis not present

## 2016-03-17 DIAGNOSIS — N400 Enlarged prostate without lower urinary tract symptoms: Secondary | ICD-10-CM | POA: Diagnosis present

## 2016-03-17 DIAGNOSIS — N183 Chronic kidney disease, stage 3 (moderate): Secondary | ICD-10-CM | POA: Diagnosis present

## 2016-03-17 DIAGNOSIS — Z8546 Personal history of malignant neoplasm of prostate: Secondary | ICD-10-CM | POA: Diagnosis not present

## 2016-03-17 DIAGNOSIS — I129 Hypertensive chronic kidney disease with stage 1 through stage 4 chronic kidney disease, or unspecified chronic kidney disease: Secondary | ICD-10-CM | POA: Diagnosis present

## 2016-03-17 DIAGNOSIS — M1990 Unspecified osteoarthritis, unspecified site: Secondary | ICD-10-CM | POA: Diagnosis present

## 2016-03-17 DIAGNOSIS — Z886 Allergy status to analgesic agent status: Secondary | ICD-10-CM | POA: Diagnosis not present

## 2016-03-17 HISTORY — PX: INGUINAL HERNIA REPAIR: SHX194

## 2016-03-17 LAB — ABO/RH: ABO/RH(D): O POS

## 2016-03-17 LAB — PROTIME-INR
INR: 1.1
Prothrombin Time: 14.4 seconds (ref 11.4–15.0)

## 2016-03-17 LAB — TYPE AND SCREEN
ABO/RH(D): O POS
ANTIBODY SCREEN: NEGATIVE

## 2016-03-17 LAB — MRSA PCR SCREENING: MRSA by PCR: NEGATIVE

## 2016-03-17 SURGERY — REPAIR, HERNIA, INGUINAL, LAPAROSCOPIC
Anesthesia: General | Laterality: Right | Wound class: Clean

## 2016-03-17 MED ORDER — LIDOCAINE HCL (CARDIAC) 20 MG/ML IV SOLN
INTRAVENOUS | Status: DC | PRN
  Administered 2016-03-17: 80 mg via INTRAVENOUS

## 2016-03-17 MED ORDER — PROPOFOL 10 MG/ML IV BOLUS
INTRAVENOUS | Status: DC | PRN
  Administered 2016-03-17: 110 mg via INTRAVENOUS

## 2016-03-17 MED ORDER — FENTANYL CITRATE (PF) 100 MCG/2ML IJ SOLN
INTRAMUSCULAR | Status: AC
  Administered 2016-03-17: 25 ug via INTRAVENOUS
  Filled 2016-03-17: qty 2

## 2016-03-17 MED ORDER — ONDANSETRON 8 MG PO TBDP
4.0000 mg | ORAL_TABLET | Freq: Four times a day (QID) | ORAL | Status: DC | PRN
  Filled 2016-03-17: qty 1

## 2016-03-17 MED ORDER — HYDRALAZINE HCL 20 MG/ML IJ SOLN: 10.0000 mg | INTRAMUSCULAR | Status: DC | PRN

## 2016-03-17 MED ORDER — ONDANSETRON HCL 4 MG/2ML IJ SOLN
4.0000 mg | Freq: Once | INTRAMUSCULAR | Status: AC
  Administered 2016-03-17: 4 mg via INTRAVENOUS
  Filled 2016-03-17: qty 2

## 2016-03-17 MED ORDER — SODIUM CHLORIDE 0.9 % IV SOLN
Freq: Once | INTRAVENOUS | Status: AC
  Administered 2016-03-17: 02:00:00 via INTRAVENOUS

## 2016-03-17 MED ORDER — SUGAMMADEX SODIUM 200 MG/2ML IV SOLN
INTRAVENOUS | Status: DC | PRN
  Administered 2016-03-17: 153.4 mg via INTRAVENOUS

## 2016-03-17 MED ORDER — ONDANSETRON HCL 4 MG/2ML IJ SOLN
INTRAMUSCULAR | Status: DC | PRN
  Administered 2016-03-17: 4 mg via INTRAVENOUS

## 2016-03-17 MED ORDER — BUPIVACAINE-EPINEPHRINE (PF) 0.25% -1:200000 IJ SOLN
INTRAMUSCULAR | Status: AC
  Filled 2016-03-17: qty 30

## 2016-03-17 MED ORDER — HYDROCODONE-ACETAMINOPHEN 5-325 MG PO TABS
1.0000 | ORAL_TABLET | ORAL | Status: DC | PRN
  Administered 2016-03-18 (×2): 1 via ORAL
  Filled 2016-03-17 (×2): qty 1

## 2016-03-17 MED ORDER — AMLODIPINE BESYLATE 10 MG PO TABS
10.0000 mg | ORAL_TABLET | Freq: Two times a day (BID) | ORAL | Status: DC
  Administered 2016-03-17 – 2016-03-18 (×2): 10 mg via ORAL
  Filled 2016-03-17 (×2): qty 1

## 2016-03-17 MED ORDER — ONDANSETRON HCL 4 MG/2ML IJ SOLN: 4.0000 mg | Freq: Four times a day (QID) | INTRAMUSCULAR | Status: DC | PRN

## 2016-03-17 MED ORDER — HYDROMORPHONE HCL 1 MG/ML IJ SOLN
1.0000 mg | Freq: Once | INTRAMUSCULAR | Status: AC
  Administered 2016-03-17: 1 mg via INTRAVENOUS
  Filled 2016-03-17: qty 1

## 2016-03-17 MED ORDER — KETOROLAC TROMETHAMINE 15 MG/ML IJ SOLN: 15.0000 mg | Freq: Four times a day (QID) | INTRAMUSCULAR | Status: DC | PRN

## 2016-03-17 MED ORDER — CEFAZOLIN SODIUM-DEXTROSE 2-3 GM-% IV SOLR
2.0000 g | INTRAVENOUS | Status: AC
  Administered 2016-03-17: 2 g via INTRAVENOUS
  Filled 2016-03-17: qty 50

## 2016-03-17 MED ORDER — PANTOPRAZOLE SODIUM 40 MG IV SOLR
40.0000 mg | Freq: Every day | INTRAVENOUS | Status: DC
  Administered 2016-03-17: 40 mg via INTRAVENOUS
  Filled 2016-03-17: qty 40

## 2016-03-17 MED ORDER — ONDANSETRON HCL 4 MG/2ML IJ SOLN: 4.0000 mg | Freq: Once | INTRAMUSCULAR | Status: DC | PRN

## 2016-03-17 MED ORDER — ACETAMINOPHEN 325 MG PO TABS: 650.0000 mg | ORAL_TABLET | ORAL | Status: DC | PRN

## 2016-03-17 MED ORDER — BUPIVACAINE-EPINEPHRINE (PF) 0.25% -1:200000 IJ SOLN
INTRAMUSCULAR | Status: DC | PRN
  Administered 2016-03-17: 30 mL

## 2016-03-17 MED ORDER — MIDAZOLAM HCL 5 MG/5ML IJ SOLN
INTRAMUSCULAR | Status: AC
  Administered 2016-03-17: 1 mg
  Filled 2016-03-17: qty 5

## 2016-03-17 MED ORDER — FENTANYL CITRATE (PF) 100 MCG/2ML IJ SOLN
INTRAMUSCULAR | Status: DC | PRN
  Administered 2016-03-17 (×3): 50 ug via INTRAVENOUS

## 2016-03-17 MED ORDER — EPHEDRINE SULFATE 50 MG/ML IJ SOLN
INTRAMUSCULAR | Status: DC | PRN
  Administered 2016-03-17: 10 mg via INTRAVENOUS

## 2016-03-17 MED ORDER — ROCURONIUM BROMIDE 100 MG/10ML IV SOLN
INTRAVENOUS | Status: DC | PRN
  Administered 2016-03-17: 30 mg via INTRAVENOUS
  Administered 2016-03-17: 10 mg via INTRAVENOUS
  Administered 2016-03-17: 5 mg via INTRAVENOUS

## 2016-03-17 MED ORDER — LACTATED RINGERS IV SOLN: INTRAVENOUS | Status: DC

## 2016-03-17 MED ORDER — MIDAZOLAM HCL 5 MG/ML IJ SOLN: 1.0000 mg | Freq: Once | INTRAMUSCULAR | Status: DC

## 2016-03-17 MED ORDER — BELLADONNA ALKALOIDS-OPIUM 16.2-60 MG RE SUPP
RECTAL | Status: AC
  Administered 2016-03-17: 1 via RECTAL
  Filled 2016-03-17: qty 1

## 2016-03-17 MED ORDER — SODIUM CHLORIDE 0.9 % IV SOLN
INTRAVENOUS | Status: DC
Start: 2016-03-17 — End: 2016-03-18
  Administered 2016-03-17 (×2): via INTRAVENOUS

## 2016-03-17 MED ORDER — BELLADONNA ALKALOIDS-OPIUM 16.2-60 MG RE SUPP
1.0000 | Freq: Every day | RECTAL | Status: DC
Start: 2016-03-17 — End: 2016-03-17
  Administered 2016-03-17: 1 via RECTAL

## 2016-03-17 MED ORDER — HEPARIN SODIUM (PORCINE) 5000 UNIT/ML IJ SOLN
5000.0000 [IU] | Freq: Three times a day (TID) | INTRAMUSCULAR | Status: DC
  Administered 2016-03-17 – 2016-03-18 (×3): 5000 [IU] via SUBCUTANEOUS
  Filled 2016-03-17 (×3): qty 1

## 2016-03-17 MED ORDER — FENTANYL CITRATE (PF) 100 MCG/2ML IJ SOLN
25.0000 ug | INTRAMUSCULAR | Status: AC | PRN
  Administered 2016-03-17 (×6): 25 ug via INTRAVENOUS

## 2016-03-17 SURGICAL SUPPLY — 40 items
ADHESIVE MASTISOL STRL (MISCELLANEOUS) ×3 IMPLANT
BLADE SURG SZ11 CARB STEEL (BLADE) ×3 IMPLANT
CANNULA DILATOR 12 W/SLV (CANNULA) IMPLANT
CANNULA DILATOR 12MM W/SLV (CANNULA)
CATH TRAY 16F METER LATEX (MISCELLANEOUS) ×3 IMPLANT
CHLORAPREP W/TINT 26ML (MISCELLANEOUS) ×3 IMPLANT
CLOSURE WOUND 1/2 X4 (GAUZE/BANDAGES/DRESSINGS) ×1
DISSECT BALLN SPACEMKR OVL PDB (BALLOONS) ×3
DISSECT BALLN SPACEMKR RND PDB (MISCELLANEOUS)
DISSECTOR BALLN SPCMKR OVL PDB (BALLOONS) ×1 IMPLANT
DISSECTOR BALLN SPCMKR RND PDB (MISCELLANEOUS) IMPLANT
GAUZE SPONGE NON-WVN 2X2 STRL (MISCELLANEOUS) ×1 IMPLANT
GLOVE BIO SURGEON STRL SZ8 (GLOVE) ×9 IMPLANT
GOWN STRL REUS W/ TWL LRG LVL3 (GOWN DISPOSABLE) ×3 IMPLANT
GOWN STRL REUS W/TWL LRG LVL3 (GOWN DISPOSABLE) ×6
IRRIGATION STRYKERFLOW (MISCELLANEOUS) IMPLANT
IRRIGATOR STRYKERFLOW (MISCELLANEOUS)
LABEL OR SOLS (LABEL) ×3 IMPLANT
MESH HERNIA 4X6 PROLITE RECT (Mesh General) ×1 IMPLANT
MESH POLY 4X6 (Mesh General) ×2 IMPLANT
NDL SAFETY 22GX1.5 (NEEDLE) ×3 IMPLANT
NS IRRIG 500ML POUR BTL (IV SOLUTION) ×3 IMPLANT
PACK LAP CHOLECYSTECTOMY (MISCELLANEOUS) ×3 IMPLANT
SCISSORS METZENBAUM CVD 33 (INSTRUMENTS) IMPLANT
SEAL FOR SCOPE WARMER C3101 (MISCELLANEOUS) IMPLANT
SPONGE LAP 18X18 5 PK (GAUZE/BANDAGES/DRESSINGS) ×3 IMPLANT
SPONGE VERSALON 2X2 STRL (MISCELLANEOUS) ×2
STRIP CLOSURE SKIN 1/2X4 (GAUZE/BANDAGES/DRESSINGS) ×2 IMPLANT
SURGILUBE 2OZ TUBE FLIPTOP (MISCELLANEOUS) ×3 IMPLANT
SUT ETHIBOND 0 (SUTURE) ×3 IMPLANT
SUT MNCRL 4-0 (SUTURE) ×4
SUT MNCRL 4-0 27XMFL (SUTURE) ×2
SUT VICRYL 0 AB UR-6 (SUTURE) ×3 IMPLANT
SUTURE MNCRL 4-0 27XMF (SUTURE) ×2 IMPLANT
TACKER 5MM HERNIA 3.5CML NAB (ENDOMECHANICALS) ×3 IMPLANT
TROCAR 5MM SINGLE VERSAONE (TROCAR) ×6 IMPLANT
TROCAR BALLN 10M OMST10SB SPAC (TROCAR) ×3 IMPLANT
TROCAR Z-THREAD FIOS 11X100 BL (TROCAR) IMPLANT
TUBING INSUFFLATOR HI FLOW (MISCELLANEOUS) ×3 IMPLANT
WATER STERILE IRR 1000ML POUR (IV SOLUTION) IMPLANT

## 2016-03-17 NOTE — Progress Notes (Signed)
Age is met in the preop holding area with the daughter present. I regain reviewed the options and the rationale for offering laparoscopic approach over an open approach and the risk bleeding infection recurrence ischemic orchitis chronic pain syndrome and neuroma and mesh placement. He was marked on the right side and he had no further questions and agreed to proceed

## 2016-03-17 NOTE — H&P (Signed)
Glenn Jarvis. is an 82 y.o. male.   Chief Complaint: incarcerated right inguinal hernia HPI: 82 year old male with a right incarcerated inguinal hernia with a past medical history of well-controlled hypertension, prostate cancer, chronic kidney disease stage III. Patient states that he has had this hernia for years and it would go in and out on its own. Patient states that it hasn't been bothering him much and he had been able to do his regular activities until today. Patient states that at about 7 PM he began having severe sharp pain in his right groin and felt as if the bulge got stuck and became hard and red and painful. Patient states he tried to relax and laid down for a bit but was unable and then began to vomit some brown emesis. Patient did have some coffee and toast around 6:30 PM prior to pain starting.  Patient then came to the emergency department and stated that he waited for about 3 hours prior to coming back. Patient states the pain at this time as a 10 out of 10 in the right groin headed down into the scrotum. Patient states that his never come out and then stuck like this previously. He is otherwise healthy and able to perform all of his own ADLs and patient is a long more around the yard to do his yard work.  Past Medical History  Diagnosis Date  . Hemorrhoid 1986  . Thyroid disease   . Hyperlipidemia   . GERD (gastroesophageal reflux disease)   . Hypertension   . Arthritis   . Vertigo     Past Surgical History  Procedure Laterality Date  . Spine surgery  1984  . Excisional hemorrhoidectomy    . Appendectomy  1968    Family History  Problem Relation Age of Onset  . Alzheimer's disease Father   . Healthy Sister    Social History:  reports that he quit smoking about 50 years ago. He has never used smokeless tobacco. He reports that he does not drink alcohol or use illicit drugs.  Allergies:  Allergies  Allergen Reactions  . Codeine   . Cortizone-10   [Hydrocortisone]      (Not in a hospital admission)  Results for orders placed or performed during the hospital encounter of 03/17/16 (from the past 48 hour(s))  Lipase, blood     Status: None   Collection Time: 03/16/16 10:55 PM  Result Value Ref Range   Lipase 23 11 - 51 U/L  Comprehensive metabolic panel     Status: Abnormal   Collection Time: 03/16/16 10:55 PM  Result Value Ref Range   Sodium 135 135 - 145 mmol/L   Potassium 4.1 3.5 - 5.1 mmol/L   Chloride 106 101 - 111 mmol/L   CO2 23 22 - 32 mmol/L   Glucose, Bld 117 (H) 65 - 99 mg/dL   BUN 34 (H) 6 - 20 mg/dL   Creatinine, Ser 2.31 (H) 0.61 - 1.24 mg/dL   Calcium 9.0 8.9 - 10.3 mg/dL   Total Protein 8.0 6.5 - 8.1 g/dL   Albumin 4.1 3.5 - 5.0 g/dL   AST 31 15 - 41 U/L   ALT 24 17 - 63 U/L   Alkaline Phosphatase 103 38 - 126 U/L   Total Bilirubin 0.7 0.3 - 1.2 mg/dL   GFR calc non Af Amer 24 (L) >60 mL/min   GFR calc Af Amer 28 (L) >60 mL/min    Comment: (NOTE) The eGFR has been  calculated using the CKD EPI equation. This calculation has not been validated in all clinical situations. eGFR's persistently <60 mL/min signify possible Chronic Kidney Disease.    Anion gap 6 5 - 15  CBC     Status: Abnormal   Collection Time: 03/16/16 10:55 PM  Result Value Ref Range   WBC 8.9 3.8 - 10.6 K/uL   RBC 4.25 (L) 4.40 - 5.90 MIL/uL   Hemoglobin 12.6 (L) 13.0 - 18.0 g/dL   HCT 37.1 (L) 40.0 - 52.0 %   MCV 87.4 80.0 - 100.0 fL   MCH 29.7 26.0 - 34.0 pg   MCHC 34.0 32.0 - 36.0 g/dL   RDW 13.8 11.5 - 14.5 %   Platelets 141 (L) 150 - 440 K/uL  Urinalysis complete, with microscopic ([facility] only)     Status: Abnormal   Collection Time: 03/16/16 10:55 PM  Result Value Ref Range   Color, Urine YELLOW (A) YELLOW   APPearance CLEAR (A) CLEAR   Glucose, UA NEGATIVE NEGATIVE mg/dL   Bilirubin Urine NEGATIVE NEGATIVE   Ketones, ur NEGATIVE NEGATIVE mg/dL   Specific Gravity, Urine 1.013 1.005 - 1.030   Hgb urine dipstick NEGATIVE  NEGATIVE   pH 5.0 5.0 - 8.0   Protein, ur 100 (A) NEGATIVE mg/dL   Nitrite NEGATIVE NEGATIVE   Leukocytes, UA NEGATIVE NEGATIVE   RBC / HPF 0-5 0 - 5 RBC/hpf   WBC, UA 0-5 0 - 5 WBC/hpf   Bacteria, UA NONE SEEN NONE SEEN   Squamous Epithelial / LPF NONE SEEN NONE SEEN  Type and screen [facility]     Status: None   Collection Time: 03/16/16 10:55 PM  Result Value Ref Range   ABO/RH(D) O POS    Antibody Screen NEG    Sample Expiration 03/19/2016   Troponin I     Status: None   Collection Time: 03/16/16 10:55 PM  Result Value Ref Range   Troponin I <0.03 <0.031 ng/mL    Comment:        NO INDICATION OF MYOCARDIAL INJURY.   Protime-INR     Status: None   Collection Time: 03/16/16 10:55 PM  Result Value Ref Range   Prothrombin Time 14.4 11.4 - 15.0 seconds   INR 1.10    No results found.  Review of Systems  Constitutional: Negative for fever, chills, weight loss and malaise/fatigue.  HENT: Negative for congestion and sore throat.   Respiratory: Negative for cough, sputum production and shortness of breath.   Cardiovascular: Negative for chest pain, palpitations and leg swelling.  Gastrointestinal: Positive for nausea, vomiting and abdominal pain. Negative for heartburn, diarrhea, constipation, blood in stool and melena.  Genitourinary: Negative for dysuria, urgency and hematuria.  Musculoskeletal: Negative for myalgias, falls and neck pain.  Skin: Negative for itching and rash.  Neurological: Negative for dizziness, loss of consciousness, weakness and headaches.  Psychiatric/Behavioral: The patient is not nervous/anxious.   All other systems reviewed and are negative.   Blood pressure 148/87, pulse 62, temperature 97.8 F (36.6 C), temperature source Oral, resp. rate 13, height 6' (1.829 m), weight 175 lb (79.379 kg), SpO2 97 %. Physical Exam  Vitals reviewed. Constitutional: He is oriented to person, place, and time. He appears well-developed and  well-nourished. No distress.  HENT:  Head: Normocephalic and atraumatic.  Right Ear: External ear normal.  Left Ear: External ear normal.  Nose: Nose normal.  Mouth/Throat: Oropharynx is clear and moist. No oropharyngeal exudate.  Eyes: Conjunctivae and  EOM are normal. Pupils are equal, round, and reactive to light. No scleral icterus.  Neck: Normal range of motion. Neck supple. No tracheal deviation present.  Cardiovascular: Normal rate, regular rhythm, normal heart sounds and intact distal pulses.  Exam reveals no gallop and no friction rub.   No murmur heard. Respiratory: Effort normal and breath sounds normal. No respiratory distress. He has no wheezes. He has no rales.  GI: Soft. Bowel sounds are normal. He exhibits no distension. There is no tenderness. There is no rebound.  Genitourinary: Penis normal. No penile tenderness.  Right inguinal hernia: incarcerated, likely some strangulation, hard, some erythema approximately 6cm in size, was able to reduce with some effort after giving pain medications and placing in steep [place]   Musculoskeletal: Normal range of motion. He exhibits no edema or tenderness.  Neurological: He is alert and oriented to person, place, and time. No cranial nerve deficit.  Skin: Skin is warm and dry. No rash noted. No erythema. No pallor.  Psychiatric: He has a normal mood and affect. His behavior is normal. Judgment and thought content normal.     Assessment/Plan 82 year old with an incarcerated right inguinal hernia. I personally reviewed his past medical history to include his chronic kidney disease stage III not on dialysis and prostate cancer previously getting radiation pellets placed in the prostate. I personally reviewed his laboratory values with some slight anemia as well as a slightly elevated BUN and creatinine which seemed to be close to his baseline. I also evaluated a CT scan of the pelvis that was done in 2008 to evaluate his prostate seed  implants which did show his right inguinal hernia at that time.    I discussed with the patient that given the chronicity of this and the fact that it was incarcerated and had to be reduced with some effort would recommend admission and repair during this hospitalization. I discussed possibility of incarceration, strangulation, enlargement in size over time, and the risk of emergency surgery in the face of strangulation if this should happen again.   Also discussed the risk of surgery including recurrence which can be up to 30% in the case of complex hernias, use of prosthetic mesh and the increased risk of infxn, post-op infxn and the possible need for re-operation and removal of mesh if used, possibility of post-op SBO or ileus, and the risks of general anesthetic including MI, CVA, sudden death or even reaction to anesthetic medications. The patient and family understands the risks, any and all questions were answered to the patient's satisfaction.  I will admit the patient and place him on the schedule for tomorrow AM for my partner Dr. Burt Knack for Open Right Inguinal hernia repair.   Hubbard Robinson, MD 03/17/2016, 2:34 AM

## 2016-03-17 NOTE — Progress Notes (Signed)
Report given  to Manuela Schwartz in [place]. Alert and oriented. No signs of acute distress.

## 2016-03-17 NOTE — ED Notes (Signed)
Surgeon in to see pt 

## 2016-03-17 NOTE — ED Notes (Signed)
Report to jenna, rn.  

## 2016-03-17 NOTE — Anesthesia Preprocedure Evaluation (Addendum)
Anesthesia Evaluation  Patient identified by MRN, date of birth, ID band Patient awake    Reviewed: Allergy & Precautions, NPO status , Patient's Chart, lab work & pertinent test results, reviewed documented beta blocker date and time   Airway Mallampati: II  TM Distance: >3 FB     Dental  (+) Chipped, Upper Dentures, Lower Dentures   Pulmonary former smoker,           Cardiovascular hypertension, Pt. on medications      Neuro/Psych    GI/Hepatic GERD  Controlled,  Endo/Other  Hypothyroidism   Renal/GU Renal InsufficiencyRenal disease     Musculoskeletal  (+) Arthritis ,   Abdominal   Peds  Hematology   Anesthesia Other Findings Gout. Prostate Ca, with radiation. Lumbar Lam with metal.  Reproductive/Obstetrics                            Anesthesia Physical Anesthesia Plan  ASA: III  Anesthesia Plan: General   Post-op Pain Management:    Induction: Intravenous  Airway Management Planned: Oral ETT  Additional Equipment:   Intra-op Plan:   Post-operative Plan:   Informed Consent: I have reviewed the patients History and Physical, chart, labs and discussed the procedure including the risks, benefits and alternatives for the proposed anesthesia with the patient or authorized representative who has indicated his/her understanding and acceptance.     Plan Discussed with: CRNA  Anesthesia Plan Comments:         Anesthesia Quick Evaluation

## 2016-03-17 NOTE — Anesthesia Procedure Notes (Signed)
Procedure Name: Intubation Performed by: Demetrius Charity Pre-anesthesia Checklist: Patient identified, Patient being monitored, Timeout performed, Emergency Drugs available and Suction available Patient Re-evaluated:Patient Re-evaluated prior to inductionOxygen Delivery Method: Circle system utilized Preoxygenation: Pre-oxygenation with 100% oxygen Intubation Type: IV induction Ventilation: Oral airway inserted - appropriate to patient size Laryngoscope Size: Mac and 4 Grade View: Grade I Tube type: Oral Tube size: 7.0 mm Number of attempts: 1 Airway Equipment and Method: Stylet Placement Confirmation: ETT inserted through vocal cords under direct vision,  positive ETCO2 and breath sounds checked- equal and bilateral Secured at: 21 cm Tube secured with: Tape Dental Injury: Teeth and Oropharynx as per pre-operative assessment

## 2016-03-17 NOTE — Progress Notes (Signed)
Initial Nutrition Assessment     INTERVENTION:  Meals and snacks: Await diet progression following surgery   NUTRITION DIAGNOSIS:   Inadequate oral intake related to altered GI function as evidenced by NPO status.    GOAL:   Patient will meet greater than or equal to 90% of their needs    MONITOR:   Diet advancement  REASON FOR ASSESSMENT:   Malnutrition Screening Tool    ASSESSMENT:      Planning lap repair of right incarcerated inguinal hernia today.  Past Medical History  Diagnosis Date  . Hemorrhoid 1986  . Thyroid disease   . Hyperlipidemia   . GERD (gastroesophageal reflux disease)   . Hypertension   . Arthritis   . Vertigo     Current Nutrition: NPO  Food/Nutrition-Related History: Pt reports for the past month eating 50% of meals or less secondary to no appetite   Scheduled Medications:  . [MAR Hold] amLODipine  10 mg Oral BID  . [MAR Hold] heparin  5,000 Units Subcutaneous 3 times per day  . [MAR Hold] pantoprazole (PROTONIX) IV  40 mg Intravenous QHS    Continuous Medications:  . sodium chloride 75 mL/hr at 03/17/16 0400     Electrolyte/Renal Profile and Glucose Profile:   Recent Labs Lab 03/16/16 2255  NA 135  K 4.1  CL 106  CO2 23  BUN 34*  CREATININE 2.31*  CALCIUM 9.0  GLUCOSE 117*   Protein Profile:  Recent Labs Lab 03/16/16 2255  ALBUMIN 4.1    Gastrointestinal Profile: Last BM: 3/19   Nutrition-Focused Physical Exam Findings: Nutrition-Focused physical exam completed. Findings are WDL for fat depletion, muscle depletion, and edema.     Weight Change: 6% wt loss in the last year and 1 month.    Diet Order:  Diet NPO time specified Except for: Ice Chips, Sips with Meds  Skin:   reviewed     Height:   Ht Readings from Last 1 Encounters:  03/17/16 6\' 1"  (1.854 m)    Weight:   Wt Readings from Last 1 Encounters:  03/17/16 169 lb (76.658 kg)    Ideal Body Weight:     BMI:  Body mass index is  22.3 kg/(m^2).  Estimated Nutritional Needs:   Kcal:  BEE 1498 kcals (IF 1.1-1.3, AF 1.3) [id number] kcals/d.   Protein:  (1.0-1.2 g/kg) 77-92 g/d  Fluid:  (30-15ml/kg) [id number] ml/d  EDUCATION NEEDS:   No education needs identified at this time  [place]. Zenia Resides, [place], [place] (pager) Weekend/On-Call pager [phone])

## 2016-03-17 NOTE — ED Provider Notes (Addendum)
[facility] Emergency Department Provider Note I saw the patient immediately upon arrival to room ____________________________________________   I have reviewed the triage vital signs and the nursing notes.   HISTORY  Chief Complaint Abdominal Pain    HPI Glenn Jarvis. is a 82 y.o. male with a history of a right hernia for the last year and a half, it comes and goes he states. At 7:00 the hernia came out. And it has been increasingly more painful since that time. He did vomit. He had a bowel movement earlier today. It actually happened after the bowel movement. He denies any fever or chills. No hematemesis no melena no bright red blood per rectum. He has never had a problem with this hernia to this extent before. Sometimes it hurts but then it goes away. He has never had to come to the hospital have it reduced. He did have it seen and evaluated by a surgeon in the past.The pain is described as sharp and significant, it is located in the inguinal region, nothing makes it better and nothing makes it worse, associated with vomiting, no other fever or other infectious symptoms antecedent or subsequent to the onset of pain  Past Medical History  Diagnosis Date  . Hemorrhoid 1986  . Thyroid disease   . Hyperlipidemia   . GERD (gastroesophageal reflux disease)   . Hypertension   . Arthritis   . Vertigo     Patient Active Problem List   Diagnosis Date Noted  . Edema 10/05/2015  . Allergic rhinitis 07/05/2015  . Arthritis 07/05/2015  . Benign hypertension 07/05/2015  . Chronic kidney disease (CKD), stage III (moderate) 07/05/2015  . Acid reflux 07/05/2015  . Gout 07/05/2015  . HLD (hyperlipidemia) 07/05/2015  . Adult hypothyroidism 07/05/2015  . Cannot sleep 07/05/2015  . Hernia, inguinal 07/05/2015  . LBP (low back pain) 07/05/2015  . Cramp in muscle 07/05/2015  . Leg paresthesia 07/05/2015  . CA of prostate ([place]) 07/05/2015  . Cutaneous eruption  07/05/2015  . Head revolving around 07/05/2015  . Hypothyroidism 07/03/2015  . Right inguinal hernia 05/30/2014    Past Surgical History  Procedure Laterality Date  . Spine surgery  1984  . Excisional hemorrhoidectomy    . Appendectomy  1968    Current Outpatient Rx  Name  Route  Sig  Dispense  Refill  . amLODipine (NORVASC) 10 MG tablet   Oral   Take 1 tablet (10 mg total) by mouth 2 (two) times daily.   60 tablet   3   . carisoprodol (SOMA) 350 MG tablet   Oral   Take 1 tablet (350 mg total) by mouth at bedtime.   30 tablet   2   . colchicine (COLCRYS) 0.6 MG tablet   Oral   Take 1 tablet (0.6 mg total) by mouth 2 (two) times daily.   40 tablet   5   . furosemide (LASIX) 20 MG tablet      TAKE 1 TABLET BY MOUTH ONCE DAILY FOR EDEMA/BP   90 tablet   3   . levothyroxine (SYNTHROID, LEVOTHROID) 50 MCG tablet      TAKE 1 TABLET BY MOUTH ONCE DAILY FOR THYROID, ON AN EMPTY STOMACH. WAIT 30 MINUTES BEFORE TAKING OTHER MEDS.   90 tablet   3   . losartan (COZAAR) 100 MG tablet      TAKE 1 TABLET BY MOUTH ONCE DAILY FOR BLOOD PRESSURE (HIGH)   90 tablet   3   .  piroxicam (FELDENE) 20 MG capsule            0   . ranitidine (ZANTAC) 150 MG tablet   Oral   Take by mouth.         . triamcinolone (KENALOG) 0.025 % cream      TRIAMCINOLONE ACETONIDE, 0.025% (External Cream)  1 (one) Cream Cream apply daily as needed for 0 days  Quantity: 60;  Refills: 0   Ordered :13-Apr-2014  [name] ;  Started 13-Apr-2014 Active Comments: Medication taken as needed.            Allergies Codeine and Cortizone-10   Family History  Problem Relation Age of Onset  . Alzheimer's disease Father   . Healthy Sister     Social History Social History  Substance Use Topics  . Smoking status: Former Smoker -- 2.00 packs/day for 25 years    Quit date: 12/28/1965  . Smokeless tobacco: Never Used  . Alcohol Use: No    Review of Systems Constitutional: No  fever/chills Eyes: No visual changes. ENT: No sore throat. No stiff neck no neck pain Cardiovascular: Denies chest pain. Respiratory: Denies shortness of breath. Gastrointestinal:   See history of present illness Genitourinary: Negative for dysuria. Musculoskeletal: Negative lower extremity swelling Skin: Negative for rash. Neurological: Negative for headaches, focal weakness or numbness. 10-point ROS otherwise negative.  ____________________________________________   PHYSICAL EXAM:  VITAL SIGNS: ED Triage Vitals  Enc Vitals Group     BP 03/16/16 2236 177/92 mmHg     Pulse Rate 03/16/16 2236 77     Resp 03/16/16 2236 24     Temp 03/16/16 2236 97.8 F (36.6 C)     Temp Source 03/16/16 2236 Oral     SpO2 03/16/16 2236 99 %     Weight 03/16/16 2236 175 lb (79.379 kg)     Height 03/16/16 2236 6' (1.829 m)     Head Cir --      Peak Flow --      Pain Score 03/16/16 2238 8     Pain Loc --      Pain Edu? --      Excl. in [place]? --     Constitutional: Alert and oriented. Well appearing and in no acute distress. Eyes: Conjunctivae are normal. PERRL. EOMI. Head: Atraumatic. Nose: No congestion/rhinnorhea. Mouth/Throat: Mucous membranes are moist.  Oropharynx non-erythematous. Neck: No stridor.   Nontender with no meningismus Cardiovascular: Normal rate, regular rhythm. Grossly normal heart sounds.  Good peripheral circulation. Respiratory: Normal respiratory effort.  No retractions. Lungs CTAB. Abdominal: Soft and nontender. There is however a large right inguinal hernia which is tender to touch, tense and slightly erythematous. Back:  There is no focal tenderness or step off there is no midline tenderness there are no lesions noted. there is no CVA tenderness GU: See GI above Musculoskeletal: No lower extremity tenderness. No joint effusions, no DVT signs strong distal pulses no edema Neurologic:  Normal speech and language. No gross focal neurologic deficits are appreciated.   Skin:  Skin is warm, dry and intact. No rash noted. Psychiatric: Mood and affect are normal. Speech and behavior are normal.  ____________________________________________   LABS (all labs ordered are listed, but only abnormal results are displayed)  Labs Reviewed  COMPREHENSIVE METABOLIC PANEL - Abnormal; Notable for the following:    Glucose, Bld 117 (*)    BUN 34 (*)    Creatinine, Ser 2.31 (*)    GFR calc non Af Wyvonnia Lora  24 (*)    GFR calc Af Amer 28 (*)    All other components within normal limits  CBC - Abnormal; Notable for the following:    RBC 4.25 (*)    Hemoglobin 12.6 (*)    HCT 37.1 (*)    Platelets 141 (*)    All other components within normal limits  URINALYSIS COMPLETEWITH MICROSCOPIC ([facility] ONLY) - Abnormal; Notable for the following:    Color, Urine YELLOW (*)    APPearance CLEAR (*)    Protein, ur 100 (*)    All other components within normal limits  LIPASE, BLOOD  TROPONIN I  PROTIME-INR  TYPE AND SCREEN  ABO/RH   ____________________________________________  EKG  I personally interpreted any EKGs ordered by me or triage Normal sinus tach rate 112 bpm no acute ST elevation or acute ST depression normal axis unremarkable EKG except for mild tachycardia ____________________________________________  RADIOLOGY  I reviewed any imaging ordered by me or triage that were performed during my shift and, if possible, patient and/or family made aware of any abnormal findings. ____________________________________________   PROCEDURES  Procedure(s) performed: None  Critical Care performed: None  ____________________________________________   INITIAL IMPRESSION / ASSESSMENT AND PLAN / ED COURSE  Pertinent labs & imaging results that were available during my care of the patient were reviewed by me and considered in my medical decision making (see chart for details).  Patient with what appears clinically to be incarcerated right inguinal hernia. I have  talked to Dr. Heath Lark of surgery, who was kind enough to offer to consult with the patient. She'll come down and see if we can reduce it. I'm giving him pain medication. Blood work is ordered and obtained. His been nothing by mouth since 6 PM. Unfortunately this is been out now for likely 7 hours. Surgery is not requesting any emergent imaging at this time given the obvious clinical manifestations of his disease.  ----------------------------------------- 2:21 AM on 03/17/2016 -----------------------------------------  Surgery was capable of getting the area reduce. Very much appreciate the consult. They will admit the patient. ____________________________________________   FINAL CLINICAL IMPRESSION(S) / ED DIAGNOSES  Final diagnoses:  None      This chart was dictated using voice recognition software.  Despite best efforts to proofread,  errors can occur which can change meaning.     Schuyler Amor, MD 03/17/16 [id number]  Schuyler Amor, MD 03/17/16 [id number]  Schuyler Amor, MD 03/17/16 [phone]

## 2016-03-17 NOTE — Op Note (Signed)
Laparoscopic Inguinal Hernia Repair  Glenn Mulling Sr.  03/17/2016  Pre-operative Diagnosis: Right Inguinal Hernia  Post-operative Diagnosis: Right  Inguinal hernia  Procedure: Laparoscopic preperitoneal repair of right inguinal hernia   Surgeon: Jerrol Banana. Burt Knack, MD FACS  Anesthesia: Gen. with endotracheal tube  Assistant: PA student  Procedure Details  The patient was seen again in the Holding Room. The benefits, complications, treatment options, and expected outcomes were discussed with the patient. The risks of bleeding, infection, recurrence of symptoms, failure to resolve symptoms, recurrence of hernia, ischemic orchitis, chronic pain syndrome or neuroma, were discussed again. The likelihood of improving the patient's symptoms with return to their baseline status is good.  The patient and/or family concurred with the proposed plan, giving informed consent.  The patient was taken to Operating Room, identified as Glenn Bateson Sr. and the procedure verified as Laparoscopic Inguinal Hernia Repair. Laterality confirmed.  A Time Out was held and the above information confirmed.  Prior to the induction of general anesthesia, antibiotic prophylaxis was administered. VTE prophylaxis was in place. General endotracheal anesthesia was then administered and tolerated well. A Foley catheter was placed by the nursing staff. After the induction, the abdomen was prepped with Chloraprep and draped in the sterile fashion. The patient was positioned in the supine position.  Local anesthetic  was injected into the skin near the umbilicus and an incision made. An incision was made and dissection down to the rectus fascia was performed. The fascia was incised and the muscle retracted laterally. The Covidien dissecting balloon was placed followed by the structural balloon. The preperitoneal space was insufflated and under direct vision 2 midline 5 mm ports were placed.  Dissection was performed to  delineate Glenn Jarvis's ligament and the lateral extent of dissection was determined. The nerve on the lateral abdominal wall was identified and kept in view at all times. The cord was skeletonized of the indirect sac and cord lipoma which was retracted cephalad. The cord itself was very thickened long-standing in nature and extended well into the scrotum.  Once this was complete, a laterally scissored Atrium mesh was placed into the preperitoneal space. It was held in place with the Covidien tacking device avoiding the area of the nerve. Once assuring that the hernia was completely repaired and adequately covered, the preperitoneal space was desufflated under direct vision. There was no sign of peritoneal rent and no sign of bowel intrusion towards the mesh.  Once assuring that hemostasis was adequate the ports were removed and a figure-of-eight 0 Vicryl suture was placed at the fascial edges. 4-0 subcuticular Monocryl was used at all skin edges. Steri-Strips and Mastisol and sterile dressings were placed.  Patient tolerated the procedure well. There were no complications. He was taken to the recovery room in stable condition to be discharged to the care of his family and follow-up in 10 days.    Findings: Large thickened long-standing indirect inguinal sac extending into the scrotum, weak floor                       Glenn Hollomon E. Burt Knack, MD, FACS

## 2016-03-17 NOTE — Progress Notes (Signed)
CC: Right groin bulge Subjective: As the patient had a right groin bulge reduced in the emergency room yesterday he has no pain and no bulge at this point and is scheduled for an open repair of an inguinal hernia on the right side this is scheduled by Dr. Heath Lark for me to perform today. Patient feels better his pain is gone bulge is gone he has no nausea or vomiting. In reviewing his history he has had no abdominal surgery but did have radioactive seeds implanted in his prostate. He has some BPH symptoms  Objective: Vital signs in last 24 hours: Temp:  [97.7 F (36.5 C)-97.8 F (36.6 C)] 97.8 F (36.6 C) (03/20 0610) Pulse Rate:  [53-77] 61 (03/20 0610) Resp:  [13-24] 22 (03/20 0610) BP: (140-181)/(71-105) 157/71 mmHg (03/20 0610) SpO2:  [96 %-100 %] 99 % (03/20 0610) Weight:  [169 lb 6.4 oz (76.839 kg)-175 lb (79.379 kg)] 169 lb 6.4 oz (76.839 kg) (03/20 0342) Last BM Date: 03/16/16  Intake/Output from previous day: 03/19 0701 - 03/20 0700 In: 75 [I.V.:75] Out: 300 [Urine:300] Intake/Output this shift:    Physical exam:  Abdomen is soft and nontender right groin shows no bulge and is nontender and no skin changes normal testicles.  Lab Results: CBC   Recent Labs  03/16/16 2255  WBC 8.9  HGB 12.6*  HCT 37.1*  PLT 141*   BMET  Recent Labs  03/16/16 2255  NA 135  K 4.1  CL 106  CO2 23  GLUCOSE 117*  BUN 34*  CREATININE 2.31*  CALCIUM 9.0   PT/INR  Recent Labs  03/16/16 2255  LABPROT 14.4  INR 1.10   ABG No results for input(s): PHART, HCO3 in the last 72 hours.  Invalid input(s): PCO2, PO2  Studies/Results: No results found.  Anti-infectives: Anti-infectives    Start     Dose/Rate Route Frequency Ordered Stop   03/17/16 0231  ceFAZolin (ANCEF) IVPB 2 g/50 mL premix     2 g 100 mL/hr over 30 Minutes Intravenous 60 min pre-op 03/17/16 [id number]        Assessment/Plan:  Patient is scheduled for an open repair of his right inguinal hernia which  was incarcerated is been reduced I believe this patient is a good candidate for laparoscopic repair and I will change the consent and the schedule appropriately. I discussed with him the laparoscopic approach and the risks of bleeding infection recurrence inability to proceed to do his radiation changes and the risks of ischemic orchitis chronic pain syndrome and neuroma he understood and agreed to proceed  Florene Glen, MD, FACS  03/17/2016

## 2016-03-17 NOTE — ED Notes (Signed)
Pt appears more comfortable. Pt states pain improved.

## 2016-03-17 NOTE — Transfer of Care (Signed)
Immediate Anesthesia Transfer of Care Note  Patient: Glenn Zahler Sr.  Procedure(s) Performed: Procedure(s): LAPAROSCOPIC INGUINAL HERNIA (Right)  Patient Location: PACU  Anesthesia Type:General  Level of Consciousness: awake and alert   Airway & Oxygen Therapy: Patient Spontanous Breathing and Patient connected to face mask oxygen  Post-op Assessment: Report given to RN and Post -op Vital signs reviewed and stable  Post vital signs: Reviewed and stable  Last Vitals:  Filed Vitals:   03/17/16 1047 03/17/16 1138  BP: 150/73 188/88  Pulse: 56 62  Temp: 36.5 C 36.4 C  Resp: 17 16    Complications: No apparent anesthesia complications

## 2016-03-17 NOTE — Anesthesia Postprocedure Evaluation (Signed)
Anesthesia Post Note  Patient: Glenn Fogerty Sr.  Procedure(s) Performed: Procedure(s) (LRB): LAPAROSCOPIC INGUINAL HERNIA (Right)  Patient location during evaluation: PACU Anesthesia Type: General Level of consciousness: awake and alert Pain management: pain level controlled Vital Signs Assessment: post-procedure vital signs reviewed and stable Respiratory status: spontaneous breathing, nonlabored ventilation, respiratory function stable and patient connected to nasal cannula oxygen Cardiovascular status: blood pressure returned to baseline and stable Postop Assessment: no signs of nausea or vomiting Anesthetic complications: no    Last Vitals:  Filed Vitals:   03/17/16 1644 03/17/16 2050  BP: 135/63 132/61  Pulse: 54 55  Temp: 36.3 C 36.6 C  Resp: 20 16    Last Pain:  Filed Vitals:   03/17/16 2050  PainSc: Tiro S

## 2016-03-18 LAB — BASIC METABOLIC PANEL
ANION GAP: 3 — AB (ref 5–15)
BUN: 28 mg/dL — AB (ref 6–20)
CHLORIDE: 109 mmol/L (ref 101–111)
CO2: 21 mmol/L — AB (ref 22–32)
Calcium: 7.6 mg/dL — ABNORMAL LOW (ref 8.9–10.3)
Creatinine, Ser: 2.35 mg/dL — ABNORMAL HIGH (ref 0.61–1.24)
GFR calc non Af Amer: 23 mL/min — ABNORMAL LOW (ref >60)
GFR, EST AFRICAN AMERICAN: 27 mL/min — AB (ref >60)
GLUCOSE: 90 mg/dL (ref 65–99)
POTASSIUM: 4.8 mmol/L (ref 3.5–5.1)
Sodium: 133 mmol/L — ABNORMAL LOW (ref 135–145)

## 2016-03-18 LAB — CBC WITH DIFFERENTIAL/PLATELET
BASOS PCT: 0 %
Basophils Absolute: 0 10*3/uL (ref 0–0.1)
Eosinophils Absolute: 0.1 10*3/uL (ref 0–0.7)
Eosinophils Relative: 2 %
HEMATOCRIT: 30.8 % — AB (ref 40.0–52.0)
HEMOGLOBIN: 10.5 g/dL — AB (ref 13.0–18.0)
LYMPHS ABS: 1.6 10*3/uL (ref 1.0–3.6)
LYMPHS PCT: 24 %
MCH: 30.7 pg (ref 26.0–34.0)
MCHC: 34.3 g/dL (ref 32.0–36.0)
MCV: 89.5 fL (ref 80.0–100.0)
MONOS PCT: 8 %
Monocytes Absolute: 0.6 10*3/uL (ref 0.2–1.0)
NEUTROS ABS: 4.5 10*3/uL (ref 1.4–6.5)
NEUTROS PCT: 66 %
Platelets: 108 10*3/uL — ABNORMAL LOW (ref 150–440)
RBC: 3.44 MIL/uL — ABNORMAL LOW (ref 4.40–5.90)
RDW: 14.2 % (ref 11.5–14.5)
WBC: 6.9 10*3/uL (ref 3.8–10.6)

## 2016-03-18 MED ORDER — HYDROCODONE-ACETAMINOPHEN 5-325 MG PO TABS: 1.0000 | ORAL_TABLET | ORAL | Status: DC | PRN

## 2016-03-18 NOTE — Discharge Instructions (Signed)
Remove dressing in 24 hours. °May shower in 24 hours. °Leave paper strips in place. °Resume all home medications. °Follow-up with Dr. Christiana Gurevich in 10 days. °

## 2016-03-18 NOTE — Discharge Summary (Signed)
Physician Discharge Summary  Patient ID: Glenn Trimmer Sr. MRN: [id number] DOB/AGE: Jun 05, 1927 82 y.o.  Admit date: 03/17/2016 Discharge date: 03/18/2016   Discharge Diagnoses:  Active Problems:   Incarcerated right inguinal hernia   Inguinal hernia with incarceration   Procedures:Laparoscopic repair of incarcerated right inguinal hernia  Hospital Course: This a patient with an incarcerated right inguinal hernia which was reduced by Dr. Heath Lark in the emergency room he was admitted for urgent repair to prevent recurrence of his symptoms. He was taken the operating room for laparoscopic repair was performed without difficulty he made a non-, complicated  postoperative recovery he is discharged in stable condition to follow-up in our office in 10 days with instructions to shower and remove dressings tomorrow  Consults: None  Disposition: Final discharge disposition not confirmed     Medication List    TAKE these medications        amLODipine 10 MG tablet  Commonly known as:  NORVASC  Take 1 tablet (10 mg total) by mouth 2 (two) times daily.     carisoprodol 350 MG tablet  Commonly known as:  SOMA  Take 1 tablet (350 mg total) by mouth at bedtime.     colchicine 0.6 MG tablet  Commonly known as:  COLCRYS  Take 1 tablet (0.6 mg total) by mouth 2 (two) times daily.     furosemide 20 MG tablet  Commonly known as:  LASIX  TAKE 1 TABLET BY MOUTH ONCE DAILY FOR EDEMA/BP     HYDROcodone-acetaminophen 5-325 MG tablet  Commonly known as:  NORCO/VICODIN  Take 1 tablet by mouth every 4 (four) hours as needed for moderate pain.     levothyroxine 50 MCG tablet  Commonly known as:  SYNTHROID, LEVOTHROID  TAKE 1 TABLET BY MOUTH ONCE DAILY FOR THYROID, ON AN EMPTY STOMACH. WAIT 30 MINUTES BEFORE TAKING OTHER MEDS.     losartan 100 MG tablet  Commonly known as:  COZAAR  TAKE 1 TABLET BY MOUTH ONCE DAILY FOR BLOOD PRESSURE (HIGH)     piroxicam 20 MG capsule  Commonly known as:   FELDENE     ranitidine 150 MG tablet  Commonly known as:  ZANTAC  Take by mouth.     triamcinolone 0.025 % cream  Commonly known as:  KENALOG  TRIAMCINOLONE ACETONIDE, 0.025% (External Cream)  1 (one) Cream Cream apply daily as needed for 0 days  Quantity: 60;  Refills: 0   Ordered :13-Apr-2014  [name] ;  Started 13-Apr-2014 Active Comments: Medication taken as needed.           Follow-up Information    Follow up with Phoebe Perch, MD In 10 days.   Specialty:  Surgery   Why:  For wound re-check   Contact information:   [place] [address] [phone]       Florene Glen, MD, FACS

## 2016-03-18 NOTE — Consult Note (Signed)
   [facility] CM Inpatient Consult   03/18/2016  Glenn Quier Sr. 05-04-1927 [id number]  Patient screened for potential [place] [facility] services. Patient is eligible for [place]. Epic reveals  there were no identifiable [facility] care management needs at this time. [facility] Care Management services not appropriate at this time. If patient's post hospital needs change please place a [facility] Care Management consult. For questions please contact:   Ashya Nicolaisen RN, [facility] Liaison  [phone]) Business Mobile [phone]) Toll free office

## 2016-03-18 NOTE — Progress Notes (Signed)
1 Day Post-Op  Subjective: Patient feels quite well after laparoscopic right inguinal hernia repair. He has no complaints at this time is tolerating a regular diet  Objective: Vital signs in last 24 hours: Temp:  [97.3 F (36.3 C)-98.7 F (37.1 C)] 98.7 F (37.1 C) (03/21 0454) Pulse Rate:  [54-81] 64 (03/21 0454) Resp:  [4-20] 16 (03/21 0454) BP: (126-188)/(61-110) 137/69 mmHg (03/21 0454) SpO2:  [96 %-100 %] 99 % (03/21 0454) Weight:  [169 lb (76.658 kg)] 169 lb (76.658 kg) (03/20 1138) Last BM Date: 03/16/16  Intake/Output from previous day: 03/20 0701 - 03/21 0700 In: 2510 [P.O.:240; I.V.:2270] Out: [id number] [Urine:1240; Blood:5] Intake/Output this shift: Total I/O In: 420 [P.O.:420] Out: -   Physical exam:  Abdomen is soft nondistended nontympanitic and nontender wounds are clean without erythema. No ecchymosis noted yet nontender calves  Lab Results: CBC   Recent Labs  03/16/16 2255 03/18/16 0545  WBC 8.9 6.9  HGB 12.6* 10.5*  HCT 37.1* 30.8*  PLT 141* 108*   BMET  Recent Labs  03/16/16 2255 03/18/16 0545  NA 135 133*  K 4.1 4.8  CL 106 109  CO2 23 21*  GLUCOSE 117* 90  BUN 34* 28*  CREATININE 2.31* 2.35*  CALCIUM 9.0 7.6*   PT/INR  Recent Labs  03/16/16 2255  LABPROT 14.4  INR 1.10   ABG No results for input(s): PHART, HCO3 in the last 72 hours.  Invalid input(s): PCO2, PO2  Studies/Results: No results found.  Anti-infectives: Anti-infectives    Start     Dose/Rate Route Frequency Ordered Stop   03/17/16 0231  [MAR Hold]  ceFAZolin (ANCEF) IVPB 2 g/50 mL premix     (MAR Hold since 03/17/16 1136)   2 g 100 mL/hr over 30 Minutes Intravenous 60 min pre-op 03/17/16 0233 03/17/16 1252      Assessment/Plan: s/p Procedure(s): LAPAROSCOPIC INGUINAL HERNIA   Patient doing very well following laparoscopic right anal hernia repair will advance diet and discharge later today. Follow-up in 10 days*  Florene Glen, MD,  FACS  03/18/2016

## 2016-03-27 ENCOUNTER — Ambulatory Visit (INDEPENDENT_AMBULATORY_CARE_PROVIDER_SITE_OTHER): Payer: Commercial Managed Care - HMO | Admitting: Surgery

## 2016-03-27 ENCOUNTER — Encounter: Payer: Self-pay | Admitting: Surgery

## 2016-03-27 VITALS — BP 194/93 | HR 60 | Temp 97.7°F | Ht 73.0 in | Wt 169.0 lb

## 2016-03-27 DIAGNOSIS — K4030 Unilateral inguinal hernia, with obstruction, without gangrene, not specified as recurrent: Secondary | ICD-10-CM

## 2016-03-27 NOTE — Progress Notes (Signed)
82 year old male status post laparoscopic inguinal hernia repair. Patient states that he is getting around well. Patient denies any pain. Patient states that the wounds have not been draining any. Patient does state that he has had a little bit of a decreased appetite but is now improving.  Filed Vitals:   03/27/16 1504  BP: 194/93  Pulse: 60  Temp: 97.7 F (36.5 C)   PE:  Gen: NAD Abd: soft, non distended, non tender, incision c/d/i  A/P: I instructed the patient that the Steri-Strips should fall off on their own. Patient can shower. Patient is to refrain from lifting anything over 15-20 pounds for the next 4 weeks. Patient can call with any questions or concerns

## 2016-03-27 NOTE — Patient Instructions (Signed)

## 2016-04-14 ENCOUNTER — Other Ambulatory Visit: Payer: Self-pay | Admitting: Family Medicine

## 2016-05-05 ENCOUNTER — Ambulatory Visit: Payer: Commercial Managed Care - HMO | Admitting: Family Medicine

## 2016-05-07 ENCOUNTER — Encounter: Payer: Self-pay | Admitting: Family Medicine

## 2016-05-07 ENCOUNTER — Ambulatory Visit (INDEPENDENT_AMBULATORY_CARE_PROVIDER_SITE_OTHER): Payer: Commercial Managed Care - HMO | Admitting: Family Medicine

## 2016-05-07 VITALS — BP 156/70 | HR 72 | Temp 98.1°F | Resp 16 | Wt 169.0 lb

## 2016-05-07 DIAGNOSIS — I10 Essential (primary) hypertension: Secondary | ICD-10-CM | POA: Diagnosis not present

## 2016-05-07 DIAGNOSIS — E039 Hypothyroidism, unspecified: Secondary | ICD-10-CM | POA: Diagnosis not present

## 2016-05-07 NOTE — Progress Notes (Signed)
Subjective:    Patient ID: Glenn Jarvis    DOB: [birth year], 82 y.o.   MRN: [id number]  Hypertension This is a chronic problem. Condition status: BP is 156/70 today. Associated symptoms include blurred vision and peripheral edema (only with gout flare ups. No gout sx today). Pertinent negatives include no anxiety, chest pain, headaches, malaise/fatigue, neck pain, orthopnea, palpitations or shortness of breath. Risk factors for coronary artery disease include dyslipidemia and Jarvis gender. Treatments tried: currently taking Amlodipine 10 mg, Losartan 100 mg. There are no compliance problems.  Hypertensive end-organ damage includes kidney disease (Pt sees nephrology for this).   Hypothyroidism Glenn Ramchandani Sr. is a 82 y.o. Jarvis who presents for follow up of hypothyroidism. Last TSH was checked 07/17/2015 and was 4.450. Current symptoms: none . Patient denies change in energy level, diarrhea, heat / cold intolerance, nervousness and palpitations. Symptoms have been well-controlled. Pt currently prescribed Levothyroxine 50 mcg.    Review of Systems  Constitutional: Negative for malaise/fatigue.  Eyes: Positive for blurred vision.  Respiratory: Negative for shortness of breath.   Cardiovascular: Negative for chest pain, palpitations and orthopnea.  Musculoskeletal: Negative for neck pain.  Neurological: Negative for headaches.   BP 156/70 mmHg  Pulse 72  Temp(Src) 98.1 F (36.7 C) (Oral)  Resp 16  Wt 169 lb (76.658 kg)   Patient Active Problem List   Diagnosis Date Noted  . Edema 10/05/2015  . Allergic rhinitis 07/05/2015  . Arthritis 07/05/2015  . Benign hypertension 07/05/2015  . Chronic kidney disease (CKD), stage III (moderate) 07/05/2015  . Acid reflux 07/05/2015  . Gout 07/05/2015  . HLD (hyperlipidemia) 07/05/2015  . Adult hypothyroidism 07/05/2015  . Cannot sleep 07/05/2015  . Hernia, inguinal 07/05/2015  . LBP (low back pain) 07/05/2015  . Cramp in muscle  07/05/2015  . Leg paresthesia 07/05/2015  . CA of prostate ([place]) 07/05/2015  . Cutaneous eruption 07/05/2015  . Head revolving around 07/05/2015  . Hypothyroidism 07/03/2015  . Right inguinal hernia 05/30/2014   Past Medical History  Diagnosis Date  . Hemorrhoid 1986  . Thyroid disease   . Hyperlipidemia   . GERD (gastroesophageal reflux disease)   . Hypertension   . Arthritis   . Vertigo    Current Outpatient Prescriptions on File Prior to Visit  Medication Sig  . amLODipine (NORVASC) 10 MG tablet Take 1 tablet (10 mg total) by mouth 2 (two) times daily.  . carisoprodol (SOMA) 350 MG tablet Take 1 tablet (350 mg total) by mouth at bedtime.  . colchicine (COLCRYS) 0.6 MG tablet Take 1 tablet (0.6 mg total) by mouth 2 (two) times daily.  . furosemide (LASIX) 20 MG tablet TAKE 1 TABLET BY MOUTH ONCE DAILY FOR EDEMA/BP  . levothyroxine (SYNTHROID, LEVOTHROID) 50 MCG tablet TAKE 1 TABLET BY MOUTH ONCE DAILY FOR THYROID, ON AN EMPTY STOMACH. WAIT 30 MINUTES BEFORE TAKING OTHER MEDS.  Marland Kitchen losartan (COZAAR) 100 MG tablet TAKE 1 TABLET BY MOUTH ONCE DAILY FOR BLOOD PRESSURE (HIGH)  . piroxicam (FELDENE) 20 MG capsule TAKE 1 CAPSULE BY MOUTH EVERY MORNING, WITH FOOD, FOR INFLAMMATION/PAIN  . triamcinolone (KENALOG) 0.025 % cream TRIAMCINOLONE ACETONIDE, 0.025% (External Cream)  1 (one) Cream Cream apply daily as needed for 0 days  Quantity: 60;  Refills: 0   Ordered :13-Apr-2014  [name] ;  Started 13-Apr-2014 Active Comments: Medication taken as needed.    No current facility-administered medications on file prior to visit.   Allergies  Allergen Reactions  .  Codeine   . Cortizone-10  [Hydrocortisone]    Past Surgical History  Procedure Laterality Date  . Spine surgery  1984  . Excisional hemorrhoidectomy    . Appendectomy  1968  . Back surgery    . Inguinal hernia repair Right 03/17/2016    Procedure: LAPAROSCOPIC INGUINAL HERNIA;  Surgeon: Florene Glen, MD;   Location: [facility] ORS;  Service: General;  Laterality: Right;   Social History   Social History  . Marital Status: Widowed    Spouse Name: N/A  . Number of Children: N/A  . Years of Education: N/A   Occupational History  . Not on file.   Social History Main Topics  . Smoking status: Former Smoker -- 2.00 packs/day for 25 years    Quit date: 12/28/1965  . Smokeless tobacco: Never Used  . Alcohol Use: No  . Drug Use: No  . Sexual Activity: Not on file   Other Topics Concern  . Not on file   Social History Narrative   Family History  Problem Relation Age of Onset  . Alzheimer's disease Father   . Healthy Sister       Objective:   Physical Exam  Constitutional: He appears well-developed and well-nourished.  Cardiovascular: Normal rate, regular rhythm and normal heart sounds.   Pulmonary/Chest: Effort normal and breath sounds normal. No respiratory distress.  Psychiatric: He has a normal mood and affect. His behavior is normal.  BP 156/70 mmHg  Pulse 72  Temp(Src) 98.1 F (36.7 C) (Oral)  Resp 16  Wt 169 lb (76.658 kg)     Assessment & Plan:  1. Hypothyroidism, unspecified hypothyroidism type Will recheck TSH today. FU pending results. - TSH  2. Benign hypertension BP is elevated today. Continue current medications and FU with nephrology. Medication may need adjusting if does not improve.     Patient seen and examined by [name], MD, and note scribed by [name], CMA.  I have reviewed the document for accuracy and completeness and I agree with above. [name], MD   [name], MD

## 2016-05-08 LAB — TSH: TSH: 5.08 u[IU]/mL — AB (ref 0.450–4.500)

## 2016-05-30 ENCOUNTER — Other Ambulatory Visit: Payer: Self-pay | Admitting: Family Medicine

## 2016-05-30 DIAGNOSIS — M1990 Unspecified osteoarthritis, unspecified site: Secondary | ICD-10-CM

## 2016-09-15 ENCOUNTER — Ambulatory Visit: Payer: Commercial Managed Care - HMO | Admitting: Family Medicine

## 2016-09-22 ENCOUNTER — Ambulatory Visit (INDEPENDENT_AMBULATORY_CARE_PROVIDER_SITE_OTHER): Payer: Commercial Managed Care - HMO | Admitting: Family Medicine

## 2016-09-22 ENCOUNTER — Encounter: Payer: Self-pay | Admitting: Family Medicine

## 2016-09-22 VITALS — BP 138/88 | Temp 97.7°F | Resp 14 | Wt 161.4 lb

## 2016-09-22 DIAGNOSIS — R0609 Other forms of dyspnea: Secondary | ICD-10-CM | POA: Diagnosis not present

## 2016-09-22 DIAGNOSIS — K219 Gastro-esophageal reflux disease without esophagitis: Secondary | ICD-10-CM | POA: Diagnosis not present

## 2016-09-22 DIAGNOSIS — R0789 Other chest pain: Secondary | ICD-10-CM | POA: Diagnosis not present

## 2016-09-22 DIAGNOSIS — E039 Hypothyroidism, unspecified: Secondary | ICD-10-CM | POA: Diagnosis not present

## 2016-09-22 DIAGNOSIS — I471 Supraventricular tachycardia: Secondary | ICD-10-CM

## 2016-09-22 DIAGNOSIS — I10 Essential (primary) hypertension: Secondary | ICD-10-CM | POA: Diagnosis not present

## 2016-09-22 MED ORDER — RANITIDINE HCL 150 MG PO CAPS: 150.0000 mg | ORAL_CAPSULE | Freq: Two times a day (BID) | ORAL | 3 refills | Status: DC

## 2016-09-22 MED ORDER — METOPROLOL TARTRATE 25 MG PO TABS: 12.5000 mg | ORAL_TABLET | Freq: Two times a day (BID) | ORAL | 3 refills | Status: DC

## 2016-09-22 NOTE — Progress Notes (Signed)
Patient: Glenn Fetch Sr. Male    DOB: Sep 15, 1927   82 y.o.   MRN: [id number] Visit Date: 09/22/2016  Today's Provider: Vernie Murders, PA   Chief Complaint  Patient presents with  . Hypertension  . Hypothyroidism  . Follow-up   Subjective:    HPI  Hypertension, follow-up:  BP Readings from Last 3 Encounters:  09/22/16 138/88  05/07/16 (!) 156/70  03/27/16 (!) 194/93    He was last seen for hypertension 4 months ago.  BP at that visit was 156/70. Management changes since that visit include continue medications. He reports excellent compliance with treatment. He is not having side effects.  He is not exercising. He is adherent to low salt diet.   Outside blood pressures are being checked. He is experiencing none.   Cardiovascular risk factors include advanced age (older than 73 for men, 65 for women), hypertension and male gender.  Use of agents associated with hypertension: none.     Weight trend: stable Wt Readings from Last 3 Encounters:  09/22/16 161 lb 6.4 oz (73.2 kg)  05/07/16 169 lb (76.7 kg)  03/27/16 169 lb (76.7 kg)    Current diet: low salt  ------------------------------------------------------------------------  Hypothyroid, follow-up:  TSH  Date Value Ref Range Status  05/07/2016 5.080 (H) 0.450 - 4.500 uIU/mL Final  07/17/2015 4.450 0.450 - 4.500 uIU/mL Final   Wt Readings from Last 3 Encounters:  09/22/16 161 lb 6.4 oz (73.2 kg)  05/07/16 169 lb (76.7 kg)  03/27/16 169 lb (76.7 kg)    He was last seen for hypothyroid 4 months ago.  Management since that visit includes continue Levothyroxine . He reports excellent compliance with treatment. He is not having side effects.  He is not exercising. He is experiencing heat / cold intolerance and weight changes He denies none Weight trend: stable  ------------------------------------------------------------------------ Past Medical History:  Diagnosis Date  . Arthritis   . GERD  (gastroesophageal reflux disease)   . Hemorrhoid 1986  . Hyperlipidemia   . Hypertension   . Thyroid disease   . Vertigo    Past Surgical History:  Procedure Laterality Date  . APPENDECTOMY  1968  . BACK SURGERY    . EXCISIONAL HEMORRHOIDECTOMY    . INGUINAL HERNIA REPAIR Right 03/17/2016   Procedure: LAPAROSCOPIC INGUINAL HERNIA;  Surgeon: Florene Glen, MD;  Location: [facility] ORS;  Service: General;  Laterality: Right;  . SPINE SURGERY  1984   Family History  Problem Relation Age of Onset  . Alzheimer's disease Father   . Healthy Sister    Allergies  Allergen Reactions  . Codeine   . Cortizone-10  [Hydrocortisone]      Previous Medications   AMLODIPINE (NORVASC) 10 MG TABLET    Take 1 tablet (10 mg total) by mouth 2 (two) times daily.   CARISOPRODOL (SOMA) 350 MG TABLET    Take 1 tablet (350 mg total) by mouth at bedtime.   COLCHICINE (COLCRYS) 0.6 MG TABLET    Take 1 tablet (0.6 mg total) by mouth 2 (two) times daily.   FUROSEMIDE (LASIX) 20 MG TABLET    TAKE 1 TABLET BY MOUTH ONCE DAILY FOR EDEMA/BP   LEVOTHYROXINE (SYNTHROID, LEVOTHROID) 50 MCG TABLET    TAKE 1 TABLET BY MOUTH ONCE DAILY FOR THYROID, ON AN EMPTY STOMACH. WAIT 30 MINUTES BEFORE TAKING OTHER MEDS.   LOSARTAN (COZAAR) 100 MG TABLET    TAKE 1 TABLET BY MOUTH ONCE DAILY FOR BLOOD PRESSURE (HIGH)   PIROXICAM (  FELDENE) 20 MG CAPSULE    TAKE 1 CAPSULE BY MOUTH ONCE EVERY MORNING WITH FOOD FOR INFLAMMATIONOR PAIN   TRIAMCINOLONE (KENALOG) 0.025 % CREAM    TRIAMCINOLONE ACETONIDE, 0.025% (External Cream)  1 (one) Cream Cream apply daily as needed for 0 days  Quantity: 60;  Refills: 0   Ordered :13-Apr-2014  [name] ;  Started 13-Apr-2014 Active Comments: Medication taken as needed.     Review of Systems  Constitutional: Negative.   Respiratory: Positive for shortness of breath.   Cardiovascular: Negative.   Endocrine: Positive for heat intolerance.  Musculoskeletal:       Leg cramps     Social  History  Substance Use Topics  . Smoking status: Former Smoker    Packs/day: 2.00    Years: 25.00    Quit date: 12/28/1965  . Smokeless tobacco: Never Used  . Alcohol use No   Objective:   BP 138/88 (BP Location: Right Arm, Patient Position: Sitting, Cuff Size: Normal)   Pulse 72   Temp 97.7 F (36.5 C) (Oral)   Resp 14   Wt 161 lb 6.4 oz (73.2 kg)   BMI 21.29 kg/m   Physical Exam  Constitutional: He is oriented to person, place, and time. He appears well-developed and well-nourished.  Eyes: Conjunctivae and EOM are normal.  Pterygium nasal edge of iris O.D.  Neck: Neck supple.  Cardiovascular: Regular rhythm and normal heart sounds.   tachycardia  Pulmonary/Chest: Effort normal and breath sounds normal.  Abdominal: Soft. Bowel sounds are normal. There is no tenderness.  Well healed scars in lower abdomen from laparoscopic right inguinal hernia repair on 03-17-16.  Musculoskeletal: Normal range of motion.  Lymphadenopathy:    He has no cervical adenopathy.  Neurological: He is alert and oriented to person, place, and time.  Skin: No rash noted.  Psychiatric: He has a normal mood and affect. His behavior is normal.      Assessment & Plan:     1. Chest discomfort Feels discomfort with eating. No discomfort to palpate. Supraventricular tachycardia today. Will treat GERD with H2 blocker and small dose of beta blocker for tachycardia. Recheck in 2 weeks. Advised to go to ER if chest discomfort persists or worsens.  - EKG 12-Lead  2. Dyspnea on exertion Associated with eating. 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 EKG showed tachycardia but no ischemic signs.  - EKG 12-Lead  3. Supraventricular tachycardia (HCC) Heart rate 116 on EKG without P waves and narrow tall QRS complexes in precordial leads. May have hidden atrial fib or need Levothyroxine dose lowered. Will check labs for electrolyte imbalance and thyroid function. Given small dose  beta blocker to slow rate. Recheck in 2 weeks.  - metoprolol tartrate (LOPRESSOR) 25 MG tablet; Take 0.5 tablets (12.5 mg total) by mouth 2 (two) times daily.  Dispense: 30 tablet; Refill: 3 - CBC with Differential/Platelet - Comprehensive metabolic panel - T4 - TSH  4. Benign hypertension Tolerating Amlodipine without side effects. Recheck labs. Good control today. - CBC with Differential/Platelet - Comprehensive metabolic panel  5. Hypothyroidism, unspecified hypothyroidism type Some weight stable and denies heat or cold intolerance today. Some fast heart rate today. Will check labs for need to lower levothyroxine dosage.  - Comprehensive metabolic panel - T4 - TSH  6. Gastroesophageal reflux disease, esophagitis presence not specified Some heartburn and chest discomfort with eating. Recommend smaller meals and increased fluid intake for possible esophageal spasm or early stricture. Treat  with Zantac 150 mg BID and recheck in 2 weeks. - ranitidine (ZANTAC) 150 MG capsule; Take 1 capsule (150 mg total) by mouth 2 (two) times daily.  Dispense: 60 capsule; Refill: 3 - CBC with Differential/Platelet

## 2016-09-23 ENCOUNTER — Telehealth: Payer: Self-pay

## 2016-09-23 LAB — CBC WITH DIFFERENTIAL/PLATELET
BASOS ABS: 0 10*3/uL (ref 0.0–0.2)
Basos: 0 %
EOS (ABSOLUTE): 0.4 10*3/uL (ref 0.0–0.4)
Eos: 5 %
Hematocrit: 39.3 % (ref 37.5–51.0)
Hemoglobin: 13.3 g/dL (ref 12.6–17.7)
Immature Grans (Abs): 0 10*3/uL (ref 0.0–0.1)
Immature Granulocytes: 0 %
LYMPHS ABS: 2.3 10*3/uL (ref 0.7–3.1)
Lymphs: 29 %
MCH: 29.3 pg (ref 26.6–33.0)
MCHC: 33.8 g/dL (ref 31.5–35.7)
MCV: 87 fL (ref 79–97)
MONOS ABS: 0.6 10*3/uL (ref 0.1–0.9)
Monocytes: 7 %
NEUTROS ABS: 4.6 10*3/uL (ref 1.4–7.0)
Neutrophils: 59 %
PLATELETS: 151 10*3/uL (ref 150–379)
RBC: 4.54 x10E6/uL (ref 4.14–5.80)
RDW: 14.1 % (ref 12.3–15.4)
WBC: 7.9 10*3/uL (ref 3.4–10.8)

## 2016-09-23 LAB — COMPREHENSIVE METABOLIC PANEL
ALK PHOS: 153 IU/L — AB (ref 39–117)
ALT: 26 IU/L (ref 0–44)
AST: 31 IU/L (ref 0–40)
Albumin/Globulin Ratio: 1 — ABNORMAL LOW (ref 1.2–2.2)
Albumin: 4 g/dL (ref 3.5–4.7)
BUN/Creatinine Ratio: 16 (ref 10–24)
BUN: 47 mg/dL — AB (ref 8–27)
Bilirubin Total: 0.4 mg/dL (ref 0.0–1.2)
CHLORIDE: 100 mmol/L (ref 96–106)
CO2: 24 mmol/L (ref 18–29)
Calcium: 10.5 mg/dL — ABNORMAL HIGH (ref 8.6–10.2)
Creatinine, Ser: 2.88 mg/dL — ABNORMAL HIGH (ref 0.76–1.27)
GFR calc Af Amer: 22 mL/min/{1.73_m2} — ABNORMAL LOW (ref >59)
GFR calc non Af Amer: 19 mL/min/{1.73_m2} — ABNORMAL LOW (ref >59)
GLUCOSE: 102 mg/dL — AB (ref 65–99)
Globulin, Total: 4.1 g/dL (ref 1.5–4.5)
Potassium: 5.7 mmol/L — ABNORMAL HIGH (ref 3.5–5.2)
Sodium: 140 mmol/L (ref 134–144)
Total Protein: 8.1 g/dL (ref 6.0–8.5)

## 2016-09-23 LAB — TSH: TSH: 7.09 u[IU]/mL — ABNORMAL HIGH (ref 0.450–4.500)

## 2016-09-23 LAB — T4: T4 TOTAL: 8.7 ug/dL (ref 4.5–12.0)

## 2016-09-23 NOTE — Telephone Encounter (Signed)
-----   Message from Margo Common, [place] sent at 09/23/2016  8:25 AM EDT ----- Normal blood cell counts. Creatinine, potassium and calcium high. These levels are worse than 6 months ago. Need recheck with urologist regarding prostate cancer and/or referral to nephrologist. Thyroid function shows normal thyroxine but TSH still elevated. Continue levothyroxine dosage and keep follow up appointment to check heart rate.

## 2016-09-23 NOTE — Telephone Encounter (Signed)
Left message for daughter [name]) to call back.

## 2016-09-25 NOTE — Telephone Encounter (Signed)
Attempted to contact patient. Tried every number in patient's chart including daughter Bayard Males. All contact numbers were either invalid or no voicemail set up.

## 2016-09-26 NOTE — Telephone Encounter (Signed)
Unable to reach patient mailed letter to home for patient to contact office. KW

## 2016-09-29 ENCOUNTER — Telehealth: Payer: Self-pay | Admitting: Family Medicine

## 2016-09-29 NOTE — Telephone Encounter (Signed)
Pt daughter is returning call.  CB#[phone]./MW

## 2016-09-29 NOTE — Telephone Encounter (Signed)
Give daughter lab reports if she is authorized to receive. Rachelle sent a letter regarding this report last week (09-23-16) because she could not get in contact with the patient.

## 2016-09-30 NOTE — Telephone Encounter (Signed)
LMTCB

## 2016-10-01 NOTE — Telephone Encounter (Signed)
Patient's daughter [name] advised of patient's lab results.

## 2016-10-06 ENCOUNTER — Encounter: Payer: Self-pay | Admitting: Family Medicine

## 2016-10-06 ENCOUNTER — Ambulatory Visit (INDEPENDENT_AMBULATORY_CARE_PROVIDER_SITE_OTHER): Payer: Commercial Managed Care - HMO | Admitting: Family Medicine

## 2016-10-06 VITALS — BP 130/90 | HR 60 | Temp 97.9°F | Resp 14 | Wt 164.2 lb

## 2016-10-06 DIAGNOSIS — N183 Chronic kidney disease, stage 3 unspecified: Secondary | ICD-10-CM

## 2016-10-06 DIAGNOSIS — K219 Gastro-esophageal reflux disease without esophagitis: Secondary | ICD-10-CM

## 2016-10-06 DIAGNOSIS — I471 Supraventricular tachycardia: Secondary | ICD-10-CM

## 2016-10-06 NOTE — Progress Notes (Signed)
Patient: Glenn Rodrigue Sr. Male    DOB: 08-04-1927   82 y.o.   MRN: [id number] Visit Date: 10/06/2016  Today's Provider: Vernie Murders, PA   Chief Complaint  Patient presents with  . Tachycardia  . Follow-up   Subjective:    HPI Supraventricular tachycardia [facility]): Patient presents for a 2 week follow up. Patient started Metoprolol 12.5 mg and Ranitidine 150 mg. Patient reports good compliance with treatment plan. Patient's pulse is 60 today.   Past Medical History:  Diagnosis Date  . Arthritis   . GERD (gastroesophageal reflux disease)   . Hemorrhoid 1986  . Hyperlipidemia   . Hypertension   . Thyroid disease   . Vertigo    Past Surgical History:  Procedure Laterality Date  . APPENDECTOMY  1968  . BACK SURGERY    . EXCISIONAL HEMORRHOIDECTOMY    . INGUINAL HERNIA REPAIR Right 03/17/2016   Procedure: LAPAROSCOPIC INGUINAL HERNIA;  Surgeon: Florene Glen, MD;  Location: [facility] ORS;  Service: General;  Laterality: Right;  . SPINE SURGERY  1984   Family History  Problem Relation Age of Onset  . Alzheimer's disease Father   . Healthy Sister    Allergies  Allergen Reactions  . Codeine   . Cortizone-10  [Hydrocortisone]      Previous Medications   AMLODIPINE (NORVASC) 10 MG TABLET    Take 1 tablet (10 mg total) by mouth 2 (two) times daily.   CARISOPRODOL (SOMA) 350 MG TABLET    Take 1 tablet (350 mg total) by mouth at bedtime.   COLCHICINE (COLCRYS) 0.6 MG TABLET    Take 1 tablet (0.6 mg total) by mouth 2 (two) times daily.   FUROSEMIDE (LASIX) 20 MG TABLET    TAKE 1 TABLET BY MOUTH ONCE DAILY FOR EDEMA/BP   LEVOTHYROXINE (SYNTHROID, LEVOTHROID) 50 MCG TABLET    TAKE 1 TABLET BY MOUTH ONCE DAILY FOR THYROID, ON AN EMPTY STOMACH. WAIT 30 MINUTES BEFORE TAKING OTHER MEDS.   LOSARTAN (COZAAR) 100 MG TABLET    TAKE 1 TABLET BY MOUTH ONCE DAILY FOR BLOOD PRESSURE (HIGH)   METOPROLOL TARTRATE (LOPRESSOR) 25 MG TABLET    Take 0.5 tablets (12.5 mg total) by mouth 2 (two)  times daily.   PIROXICAM (FELDENE) 20 MG CAPSULE    TAKE 1 CAPSULE BY MOUTH ONCE EVERY MORNING WITH FOOD FOR INFLAMMATIONOR PAIN   RANITIDINE (ZANTAC) 150 MG CAPSULE    Take 1 capsule (150 mg total) by mouth 2 (two) times daily.   TRIAMCINOLONE (KENALOG) 0.025 % CREAM    TRIAMCINOLONE ACETONIDE, 0.025% (External Cream)  1 (one) Cream Cream apply daily as needed for 0 days  Quantity: 60;  Refills: 0   Ordered :13-Apr-2014  [name] ;  Started 13-Apr-2014 Active Comments: Medication taken as needed.     Review of Systems  Constitutional: Negative.   Respiratory: Negative.   Cardiovascular: Negative.     Social History  Substance Use Topics  . Smoking status: Former Smoker    Packs/day: 2.00    Years: 25.00    Quit date: 12/28/1965  . Smokeless tobacco: Never Used  . Alcohol use No   Objective:   BP 130/90 (BP Location: Right Arm, Patient Position: Sitting, Cuff Size: Normal)   Pulse 60   Temp 97.9 F (36.6 C) (Oral)   Resp 14   Wt 164 lb 3.2 oz (74.5 kg)   SpO2 99%   BMI 21.66 kg/m  BP Readings from Last 3 Encounters:  10/06/16 130/90  09/22/16 138/88  05/07/16 (!) 156/70   Physical Exam  Constitutional: He is oriented to person, place, and time. He appears well-developed and well-nourished. No distress.  HENT:  Head: Normocephalic and atraumatic.  Right Ear: Hearing normal.  Left Ear: Hearing normal.  Nose: Nose normal.  Eyes: Conjunctivae and lids are normal. Right eye exhibits no discharge. Left eye exhibits no discharge. No scleral icterus.  Neck: Neck supple.  Cardiovascular: Normal rate and regular rhythm.   Pulmonary/Chest: Effort normal and breath sounds normal. No respiratory distress.  Abdominal: Soft. Bowel sounds are normal.  Musculoskeletal: Normal range of motion.  Neurological: He is alert and oriented to person, place, and time.  Skin: Skin is intact. No lesion and no rash noted.  Psychiatric: He has a normal mood and affect. His speech  is normal and behavior is normal. Thought content normal.      Assessment & Plan:     1. SVT (supraventricular tachycardia) (HCC) Pulse/heart rate back to normal without irregularities since starting Metoprolol 12,5 mg BID. No chest pains or dyspnea now. Continue this regimen and recheck in a month.  2. Chronic kidney disease (CKD), stage III (moderate) Creatinine 2.88 on 09-22-16. No dysuria, nocturia or hesitancy/decreased stream. States daughter is scheduling follow up with his urologist. Recheck renal function in 1 month.  3. Gastroesophageal reflux disease, esophagitis presence not specified Dyspepsia much improved with adding Zantac. Good appetite.

## 2016-11-10 ENCOUNTER — Ambulatory Visit: Payer: Commercial Managed Care - HMO | Admitting: Family Medicine

## 2016-11-11 ENCOUNTER — Ambulatory Visit: Payer: Commercial Managed Care - HMO | Admitting: Family Medicine

## 2016-11-18 ENCOUNTER — Telehealth: Payer: Self-pay

## 2016-11-18 ENCOUNTER — Encounter: Payer: Self-pay | Admitting: Family Medicine

## 2016-11-18 ENCOUNTER — Ambulatory Visit (INDEPENDENT_AMBULATORY_CARE_PROVIDER_SITE_OTHER): Payer: Commercial Managed Care - HMO | Admitting: Family Medicine

## 2016-11-18 VITALS — BP 164/84 | HR 68 | Temp 97.6°F | Resp 16 | Wt 163.4 lb

## 2016-11-18 DIAGNOSIS — I10 Essential (primary) hypertension: Secondary | ICD-10-CM

## 2016-11-18 DIAGNOSIS — N183 Chronic kidney disease, stage 3 unspecified: Secondary | ICD-10-CM

## 2016-11-18 DIAGNOSIS — I471 Supraventricular tachycardia: Secondary | ICD-10-CM | POA: Diagnosis not present

## 2016-11-18 NOTE — Telephone Encounter (Signed)
Patient's daughter [name] is requesting to have his father seen by nephrologist sooner. CB# is [phone].

## 2016-11-18 NOTE — Telephone Encounter (Signed)
Daughter was to let us know who he was to see. Is it Dr. Holley Raring? His creatinine and blood pressure has worsened. Has a diagnosis of chronic stage III chronic kidney disease. Would like for the nephrologist to see him in the next 3-4 weeks.

## 2016-11-18 NOTE — Progress Notes (Signed)
Patient: Glenn Tandy Sr. Male    DOB: 24-May-1927   82 y.o.   MRN: [id number] Visit Date: 11/18/2016  Today's Provider: Vernie Murders, PA   Chief Complaint  Patient presents with  . Follow-up   Subjective:    HPI  Supraventricular tachycardia [facility]): Patient presents for a 4 week follow up. Patient advised to continue Metoprolol 12.5 mg BID. His pulse/heart rate was back to normal without irregularities at that office visit. Patient reports good compliance with treatment plan. Patient's pulse is 68 today.   Past Medical History:  Diagnosis Date  . Arthritis   . GERD (gastroesophageal reflux disease)   . Hemorrhoid 1986  . Hyperlipidemia   . Hypertension   . Thyroid disease   . Vertigo    Past Surgical History:  Procedure Laterality Date  . APPENDECTOMY  1968  . BACK SURGERY    . EXCISIONAL HEMORRHOIDECTOMY    . INGUINAL HERNIA REPAIR Right 03/17/2016   Procedure: LAPAROSCOPIC INGUINAL HERNIA;  Surgeon: Florene Glen, MD;  Location: [facility] ORS;  Service: General;  Laterality: Right;  . SPINE SURGERY  1984   Family History  Problem Relation Age of Onset  . Alzheimer's disease Father   . Healthy Sister    Allergies  Allergen Reactions  . Codeine   . Cortizone-10  [Hydrocortisone]      Previous Medications   AMLODIPINE (NORVASC) 10 MG TABLET    Take 1 tablet (10 mg total) by mouth 2 (two) times daily.   CARISOPRODOL (SOMA) 350 MG TABLET    Take 1 tablet (350 mg total) by mouth at bedtime.   COLCHICINE (COLCRYS) 0.6 MG TABLET    Take 1 tablet (0.6 mg total) by mouth 2 (two) times daily.   FUROSEMIDE (LASIX) 20 MG TABLET    TAKE 1 TABLET BY MOUTH ONCE DAILY FOR EDEMA/BP   LEVOTHYROXINE (SYNTHROID, LEVOTHROID) 50 MCG TABLET    TAKE 1 TABLET BY MOUTH ONCE DAILY FOR THYROID, ON AN EMPTY STOMACH. WAIT 30 MINUTES BEFORE TAKING OTHER MEDS.   LOSARTAN (COZAAR) 100 MG TABLET    TAKE 1 TABLET BY MOUTH ONCE DAILY FOR BLOOD PRESSURE (HIGH)   METOPROLOL TARTRATE (LOPRESSOR) 25  MG TABLET    Take 0.5 tablets (12.5 mg total) by mouth 2 (two) times daily.   PIROXICAM (FELDENE) 20 MG CAPSULE    TAKE 1 CAPSULE BY MOUTH ONCE EVERY MORNING WITH FOOD FOR INFLAMMATIONOR PAIN   RANITIDINE (ZANTAC) 150 MG CAPSULE    Take 1 capsule (150 mg total) by mouth 2 (two) times daily.   TRIAMCINOLONE (KENALOG) 0.025 % CREAM    TRIAMCINOLONE ACETONIDE, 0.025% (External Cream)  1 (one) Cream Cream apply daily as needed for 0 days  Quantity: 60;  Refills: 0   Ordered :13-Apr-2014  [name] ;  Started 13-Apr-2014 Active Comments: Medication taken as needed.     Review of Systems  Constitutional: Negative.   Respiratory: Negative.   Cardiovascular: Negative.     Social History  Substance Use Topics  . Smoking status: Former Smoker    Packs/day: 2.00    Years: 25.00    Quit date: 12/28/1965  . Smokeless tobacco: Never Used  . Alcohol use No   Objective:   BP (!) 164/84 (BP Location: Right Arm, Patient Position: Sitting, Cuff Size: Normal)   Pulse 68   Temp 97.6 F (36.4 C) (Oral)   Resp 16   Wt 163 lb 6.4 oz (74.1 kg)   BMI 21.56 kg/m  BP Readings from Last 3 Encounters:  11/18/16 (!) 164/84  10/06/16 130/90  09/22/16 138/88    Physical Exam  Constitutional: He is oriented to person, place, and time. He appears well-developed and well-nourished.  HENT:  Head: Normocephalic.  Eyes: Conjunctivae are normal.  Neck: Neck supple.  Cardiovascular: Normal rate and regular rhythm.   Pulmonary/Chest: Effort normal and breath sounds normal.  Abdominal: Bowel sounds are normal.  Neurological: He is alert and oriented to person, place, and time.      Assessment & Plan:     1. Benign hypertension  Still taking Amlodipine, Losartan and Metoprolol with elevation of BP today. Thinks it is due to stress at home today (sitter was late arriving for watching grand daughter this morning). States he has an appointment with his nephrologist soon to follow up BP and kidney  function.  2. Chronic kidney disease (CKD), stage III (moderate) Creatinine 2.88 on 09-22-16. Still using Lasix 20 mg qd for edema and hypertension.  3. SVT (supraventricular tachycardia) (HCC) No palpitations or flutter since starting the Metoprolol 25 mg 1/2 tablet BID. No chest pain with exertion. Some dyspnea at rest but feel better with activities. May need cardiology referral if it becomes persistent.

## 2016-11-24 NOTE — Telephone Encounter (Signed)
Referral placed in EPIC 

## 2016-11-24 NOTE — Telephone Encounter (Signed)
Please add referral to [facility]

## 2016-11-24 NOTE — Telephone Encounter (Signed)
Spoke with daughter on the phone who has spoken already with Simona Huh. Daughter is requesting appt date and time? Please review and advise. KW

## 2016-12-04 ENCOUNTER — Other Ambulatory Visit: Payer: Self-pay | Admitting: Family Medicine

## 2016-12-04 DIAGNOSIS — M109 Gout, unspecified: Secondary | ICD-10-CM

## 2016-12-04 DIAGNOSIS — R609 Edema, unspecified: Secondary | ICD-10-CM

## 2016-12-04 DIAGNOSIS — E039 Hypothyroidism, unspecified: Secondary | ICD-10-CM

## 2016-12-04 DIAGNOSIS — I10 Essential (primary) hypertension: Secondary | ICD-10-CM

## 2016-12-05 ENCOUNTER — Other Ambulatory Visit: Payer: Self-pay

## 2016-12-05 DIAGNOSIS — I471 Supraventricular tachycardia: Secondary | ICD-10-CM

## 2016-12-05 MED ORDER — METOPROLOL TARTRATE 25 MG PO TABS: 12.5000 mg | ORAL_TABLET | Freq: Two times a day (BID) | ORAL | 3 refills | Status: DC

## 2016-12-05 NOTE — Telephone Encounter (Signed)
Refill request received from [facility] requesting metoprolol tartrate (LOPRESSOR) 25 MG tablet

## 2016-12-05 NOTE — Telephone Encounter (Signed)
Please review-aa 

## 2016-12-24 ENCOUNTER — Encounter: Payer: Self-pay | Admitting: Family Medicine

## 2017-02-25 ENCOUNTER — Telehealth: Payer: Self-pay

## 2017-02-25 ENCOUNTER — Other Ambulatory Visit: Payer: Self-pay

## 2017-02-25 DIAGNOSIS — G4709 Other insomnia: Secondary | ICD-10-CM

## 2017-02-25 NOTE — Telephone Encounter (Signed)
Refill request from [facility] on Piroxicam. Please review-aa

## 2017-02-26 ENCOUNTER — Other Ambulatory Visit: Payer: Self-pay

## 2017-02-27 MED ORDER — PIROXICAM 20 MG PO CAPS: ORAL_CAPSULE | ORAL | 5 refills | Status: DC

## 2017-03-06 ENCOUNTER — Telehealth: Payer: Self-pay | Admitting: Family Medicine

## 2017-03-06 NOTE — Telephone Encounter (Signed)
Called Pt to schedule AWV with [facility] - knb °

## 2017-03-16 ENCOUNTER — Other Ambulatory Visit: Payer: Self-pay

## 2017-03-16 NOTE — Telephone Encounter (Signed)
Alan Mulder (on PDR) is calling on behalf of her father requesting medication refill on Carisoprodol 350 MG tablet. Pharmacy: [facility].  Last written:09/11/15  Thanks,  -Lavayah Vita

## 2017-03-16 NOTE — Telephone Encounter (Signed)
This medication increases risk for falls and drowsiness at his age. Should assess balance/coordination and mental status to see if a refill is appropriate.

## 2017-03-17 NOTE — Telephone Encounter (Signed)
FYI: Spoke with Yolanda, advised as directed below.She reports patient is scheduled for 04/05 to come in. Meanwhile she was hoping for patient to get the medication because he is in a lot of pain.   Thanks,  -Jehan Ranganathan

## 2017-03-17 NOTE — Telephone Encounter (Signed)
May try Percogesic (OTC) one at bedtime for pain and muscle spasms. A little less sedating and less chance of hangover effect in the mornings.

## 2017-03-17 NOTE — Telephone Encounter (Signed)
[name] was advised as directed below.  Thanks,  -Rheagan Nayak

## 2017-04-02 ENCOUNTER — Ambulatory Visit (INDEPENDENT_AMBULATORY_CARE_PROVIDER_SITE_OTHER): Payer: Medicare HMO

## 2017-04-02 ENCOUNTER — Ambulatory Visit (INDEPENDENT_AMBULATORY_CARE_PROVIDER_SITE_OTHER): Payer: Medicare HMO | Admitting: Family Medicine

## 2017-04-02 ENCOUNTER — Encounter: Payer: Self-pay | Admitting: Family Medicine

## 2017-04-02 VITALS — BP 184/90 | HR 68 | Temp 97.9°F | Ht 73.0 in | Wt 164.8 lb

## 2017-04-02 DIAGNOSIS — E039 Hypothyroidism, unspecified: Secondary | ICD-10-CM | POA: Diagnosis not present

## 2017-04-02 DIAGNOSIS — I10 Essential (primary) hypertension: Secondary | ICD-10-CM

## 2017-04-02 DIAGNOSIS — E782 Mixed hyperlipidemia: Secondary | ICD-10-CM

## 2017-04-02 DIAGNOSIS — M79672 Pain in left foot: Secondary | ICD-10-CM | POA: Diagnosis not present

## 2017-04-02 DIAGNOSIS — M1990 Unspecified osteoarthritis, unspecified site: Secondary | ICD-10-CM | POA: Diagnosis not present

## 2017-04-02 DIAGNOSIS — M109 Gout, unspecified: Secondary | ICD-10-CM

## 2017-04-02 DIAGNOSIS — Z0000 Encounter for general adult medical examination without abnormal findings: Secondary | ICD-10-CM | POA: Diagnosis not present

## 2017-04-02 NOTE — Patient Instructions (Signed)
Glenn Jarvis , Thank you for taking time to come for your Medicare Wellness Visit. I appreciate your ongoing commitment to your health goals. Please review the following plan we discussed and let me know if I can assist you in the future.   Screening recommendations/referrals: Colonoscopy: N/A Recommended yearly ophthalmology/optometry visit for glaucoma screening and checkup Recommended yearly dental visit for hygiene and checkup  Vaccinations: Influenza vaccine: declined Pneumococcal vaccine: declined Tdap vaccine: declined Shingles vaccine: declined    Advanced directives: Packet given, requested copy once completed.  Next appointment: None  Preventive Care 65 Years and Older, Male Preventive care refers to lifestyle choices and visits with your health care provider that can promote health and wellness. What does preventive care include?  A yearly physical exam. This is also called an annual well check.  Dental exams once or twice a year.  Routine eye exams. Ask your health care provider how often you should have your eyes checked.  Personal lifestyle choices, including:  Daily care of your teeth and gums.  Regular physical activity.  Eating a healthy diet.  Avoiding tobacco and drug use.  Limiting alcohol use.  Practicing safe sex.  Taking low doses of aspirin every day.  Taking vitamin and mineral supplements as recommended by your health care provider. What happens during an annual well check? The services and screenings done by your health care provider during your annual well check will depend on your age, overall health, lifestyle risk factors, and family history of disease. Counseling  Your health care provider may ask you questions about your:  Alcohol use.  Tobacco use.  Drug use.  Emotional well-being.  Home and relationship well-being.  Sexual activity.  Eating habits.  History of falls.  Memory and ability to understand  (cognition).  Work and work Statistician. Screening  You may have the following tests or measurements:  Height, weight, and BMI.  Blood pressure.  Lipid and cholesterol levels. These may be checked every 5 years, or more frequently if you are over 23 years old.  Skin check.  Lung cancer screening. You may have this screening every year starting at age 69 if you have a 30-pack-year history of smoking and currently smoke or have quit within the past 15 years.  Fecal occult blood test (FOBT) of the stool. You may have this test every year starting at age 85.  Flexible sigmoidoscopy or colonoscopy. You may have a sigmoidoscopy every 5 years or a colonoscopy every 10 years starting at age 47.  Prostate cancer screening. Recommendations will vary depending on your family history and other risks.  Hepatitis C blood test.  Hepatitis B blood test.  Sexually transmitted disease (STD) testing.  Diabetes screening. This is done by checking your blood sugar (glucose) after you have not eaten for a while (fasting). You may have this done every 1-3 years.  Abdominal aortic aneurysm (AAA) screening. You may need this if you are a current or former smoker.  Osteoporosis. You may be screened starting at age 17 if you are at high risk. Talk with your health care provider about your test results, treatment options, and if necessary, the need for more tests. Vaccines  Your health care provider may recommend certain vaccines, such as:  Influenza vaccine. This is recommended every year.  Tetanus, diphtheria, and acellular pertussis (Tdap, Td) vaccine. You may need a Td booster every 10 years.  Zoster vaccine. You may need this after age 68.  Pneumococcal 13-valent conjugate (PCV13) vaccine. One  dose is recommended after age 21.  Pneumococcal polysaccharide (PPSV23) vaccine. One dose is recommended after age 59. Talk to your health care provider about which screenings and vaccines you need and  how often you need them. This information is not intended to replace advice given to you by your health care provider. Make sure you discuss any questions you have with your health care provider. Document Released: 01/11/2016 Document Revised: 09/03/2016 Document Reviewed: 10/16/2015 Elsevier Interactive Patient Education  2017 [place] Prevention in the Home Falls can cause injuries. They can happen to people of all ages. There are many things you can do to make your home safe and to help prevent falls. What can I do on the outside of my home?  Regularly fix the edges of walkways and driveways and fix any cracks.  Remove anything that might make you trip as you walk through a door, such as a raised step or threshold.  Trim any bushes or trees on the path to your home.  Use bright outdoor lighting.  Clear any walking paths of anything that might make someone trip, such as rocks or tools.  Regularly check to see if handrails are loose or broken. Make sure that both sides of any steps have handrails.  Any raised decks and porches should have guardrails on the edges.  Have any leaves, snow, or ice cleared regularly.  Use sand or salt on walking paths during winter.  Clean up any spills in your garage right away. This includes oil or grease spills. What can I do in the bathroom?  Use night lights.  Install grab bars by the toilet and in the tub and shower. Do not use towel bars as grab bars.  Use non-skid mats or decals in the tub or shower.  If you need to sit down in the shower, use a plastic, non-slip stool.  Keep the floor dry. Clean up any water that spills on the floor as soon as it happens.  Remove soap buildup in the tub or shower regularly.  Attach bath mats securely with double-sided non-slip rug tape.  Do not have throw rugs and other things on the floor that can make you trip. What can I do in the bedroom?  Use night lights.  Make sure that you  have a light by your bed that is easy to reach.  Do not use any sheets or blankets that are too big for your bed. They should not hang down onto the floor.  Have a firm chair that has side arms. You can use this for support while you get dressed.  Do not have throw rugs and other things on the floor that can make you trip. What can I do in the kitchen?  Clean up any spills right away.  Avoid walking on wet floors.  Keep items that you use a lot in easy-to-reach places.  If you need to reach something above you, use a strong step stool that has a grab bar.  Keep electrical cords out of the way.  Do not use floor polish or wax that makes floors slippery. If you must use wax, use non-skid floor wax.  Do not have throw rugs and other things on the floor that can make you trip. What can I do with my stairs?  Do not leave any items on the stairs.  Make sure that there are handrails on both sides of the stairs and use them. Fix handrails that are broken or  loose. Make sure that handrails are as long as the stairways.  Check any carpeting to make sure that it is firmly attached to the stairs. Fix any carpet that is loose or worn.  Avoid having throw rugs at the top or bottom of the stairs. If you do have throw rugs, attach them to the floor with carpet tape.  Make sure that you have a light switch at the top of the stairs and the bottom of the stairs. If you do not have them, ask someone to add them for you. What else can I do to help prevent falls?  Wear shoes that:  Do not have high heels.  Have rubber bottoms.  Are comfortable and fit you well.  Are closed at the toe. Do not wear sandals.  If you use a stepladder:  Make sure that it is fully opened. Do not climb a closed stepladder.  Make sure that both sides of the stepladder are locked into place.  Ask someone to hold it for you, if possible.  Clearly mark and make sure that you can see:  Any grab bars or  handrails.  First and last steps.  Where the edge of each step is.  Use tools that help you move around (mobility aids) if they are needed. These include:  Canes.  Walkers.  Scooters.  Crutches.  Turn on the lights when you go into a dark area. Replace any light bulbs as soon as they burn out.  Set up your furniture so you have a clear path. Avoid moving your furniture around.  If any of your floors are uneven, fix them.  If there are any pets around you, be aware of where they are.  Review your medicines with your doctor. Some medicines can make you feel dizzy. This can increase your chance of falling. Ask your doctor what other things that you can do to help prevent falls. This information is not intended to replace advice given to you by your health care provider. Make sure you discuss any questions you have with your health care provider. Document Released: 10/11/2009 Document Revised: 05/22/2016 Document Reviewed: 01/19/2015 Elsevier Interactive Patient Education  2017 [employer].

## 2017-04-02 NOTE — Progress Notes (Signed)
Patient: Glenn Jarvis., Male    DOB: 1927/01/23, 83 y.o.   MRN: [id number] Visit Date: 04/02/2017  Today's Provider: Vernie Murders, PA   Chief Complaint  Patient presents with  . Annual Exam   Subjective:    Annual physical exam Glenn Ebron Sr. is a 83 y.o. male who presents today for health maintenance and complete physical. He feels well. He reports exercising in the yard daily. He still does his own yard work and mows his yard and his daughter's yard. He reports he is sleeping well.  ----------------------------------------------------------------- Colonoscopy- had years ago with Dr. Allen Norris, Declines further testing  Declines Tdap and Prevnar today. But he will check with his insurance about the Tdap.  Pt only complaint today is he has a burning pain in both of his feet. Located on the top of his feet. He says he has been taking his gout medication but not doing any good.   Review of Systems  Constitutional: Negative.   HENT: Negative.   Eyes: Negative.   Respiratory: Negative.   Cardiovascular: Negative.   Gastrointestinal: Negative.   Endocrine: Negative.   Genitourinary: Negative.   Musculoskeletal: Negative.        Foot pain, burning sensation.   Skin: Negative.   Allergic/Immunologic: Negative.   Neurological: Negative.   Hematological: Negative.   Psychiatric/Behavioral: Negative.     Social History      He  reports that he quit smoking about 51 years ago. He has a 50.00 pack-year smoking history. He has never used smokeless tobacco. He reports that he drinks alcohol. He reports that he does not use drugs.       Social History   Social History  . Marital status: Widowed    Spouse name: N/A  . Number of children: N/A  . Years of education: N/A   Social History Main Topics  . Smoking status: Former Smoker    Packs/day: 2.00    Years: 25.00    Quit date: 12/28/1965  . Smokeless tobacco: Never Used  . Alcohol use Yes     Comment: 1 beer  occasionally  . Drug use: No  . Sexual activity: Not Asked   Other Topics Concern  . None   Social History Narrative  . None    Past Medical History:  Diagnosis Date  . Arthritis   . GERD (gastroesophageal reflux disease)   . Hemorrhoid 1986  . Hyperlipidemia   . Hypertension   . Thyroid disease   . Vertigo      Patient Active Problem List   Diagnosis Date Noted  . Edema 10/05/2015  . Allergic rhinitis 07/05/2015  . Arthritis 07/05/2015  . Benign hypertension 07/05/2015  . Chronic kidney disease (CKD), stage III (moderate) 07/05/2015  . Acid reflux 07/05/2015  . Gout 07/05/2015  . HLD (hyperlipidemia) 07/05/2015  . Adult hypothyroidism 07/05/2015  . Cannot sleep 07/05/2015  . Hernia, inguinal 07/05/2015  . LBP (low back pain) 07/05/2015  . Cramp in muscle 07/05/2015  . Leg paresthesia 07/05/2015  . CA of prostate ([place]) 07/05/2015  . Cutaneous eruption 07/05/2015  . Head revolving around 07/05/2015  . Hypothyroidism 07/03/2015  . Right inguinal hernia 05/30/2014    Past Surgical History:  Procedure Laterality Date  . APPENDECTOMY  1968  . BACK SURGERY    . EXCISIONAL HEMORRHOIDECTOMY    . INGUINAL HERNIA REPAIR Right 03/17/2016   Procedure: LAPAROSCOPIC INGUINAL HERNIA;  Surgeon: Florene Glen, MD;  Location: [facility] ORS;  Service: General;  Laterality: Right;  . [name]    Family History        Family Status  Relation Status  . Mother Deceased   Unknown cause of death  . Father Deceased  . Sister Alive        His family history includes Alzheimer's disease in his father; Healthy in his sister.     Allergies  Allergen Reactions  . Codeine   . Cortizone-10  [Hydrocortisone]     Current Outpatient Prescriptions:  .  amLODipine (NORVASC) 10 MG tablet, Take 1 tablet (10 mg total) by mouth 2 (two) times daily., Disp: 60 tablet, Rfl: 3 .  carisoprodol (SOMA) 350 MG tablet, Take 1 tablet (350 mg total) by mouth at bedtime., Disp: 30  tablet, Rfl: 2 .  COLCRYS 0.6 MG tablet, TAKE 1 TABLET BY MOUTH TWICE DAILY., Disp: 40 tablet, Rfl: 5 .  furosemide (LASIX) 20 MG tablet, TAKE 1 TABLET BY MOUTH ONCE DAILY FOR EDEMA/BP (Patient taking differently: TAKE 1 TABLET BY MOUTH ONCE DAILY FOR EDEMA/BP PRN), Disp: 90 tablet, Rfl: 3 .  levothyroxine (SYNTHROID, LEVOTHROID) 50 MCG tablet, TAKE 1 TABLET BY MOUTH ONCE DAILY FOR THYROID, ON AN EMPTY STOMACH. WAIT 30 MINUTES BEFORE TAKING OTHER MEDS., Disp: 90 tablet, Rfl: 3 .  losartan (COZAAR) 100 MG tablet, TAKE 1 TABLET BY MOUTH ONCE DAILY FOR BLOOD PRESSURE (HIGH), Disp: 90 tablet, Rfl: 3 .  metoprolol tartrate (LOPRESSOR) 25 MG tablet, Take 0.5 tablets (12.5 mg total) by mouth 2 (two) times daily., Disp: 90 tablet, Rfl: 3 .  piroxicam (FELDENE) 20 MG capsule, TAKE 1 CAPSULE BY MOUTH ONCE EVERY MORNING WITH FOOD FOR INFLAMMATIONOR PAIN, Disp: 30 capsule, Rfl: 5 .  triamcinolone (KENALOG) 0.025 % cream, TRIAMCINOLONE ACETONIDE, 0.025% (External Cream)  1 (one) Cream Cream apply daily as needed for 0 days  Quantity: 60;  Refills: 0   Ordered :13-Apr-2014  Glenn Jarvis ;  Started 13-Apr-2014 Active Comments: Medication taken as needed. , Disp: , Rfl:  .  ranitidine (ZANTAC) 150 MG capsule, Take 1 capsule (150 mg total) by mouth 2 (two) times daily. (Patient not taking: Reported on 04/02/2017), Disp: 60 capsule, Rfl: 3   Patient Care Team: Margo Common, PA as PCP - General (Family Medicine)      Objective:   Vitals: BP (!) 184/90   Pulse 68   Temp 97.9 F (36.6 C) (Oral)   Ht 6\' 1"  (1.854 m)   Wt 164 lb 12.8 oz (74.8 kg)   BMI 21.74 kg/m    Vitals:   04/02/17 0928  BP: (!) 184/90  Pulse: 68  Temp: 97.9 F (36.6 C)  TempSrc: Oral  Weight: 164 lb 12.8 oz (74.8 kg)  Height: 6\' 1"  (1.854 m)   Wt Readings from Last 3 Encounters:  04/02/17 164 lb 12.8 oz (74.8 kg)  04/02/17 164 lb 12.8 oz (74.8 kg)  11/18/16 163 lb 6.4 oz (74.1 kg)   BP Readings from Last 3 Encounters:    04/02/17 (!) 184/90  04/02/17 (!) 184/90  11/18/16 (!) 164/84      Physical Exam  Constitutional: He is oriented to person, place, and time. He appears well-developed and well-nourished.  HENT:  Head: Normocephalic.  Right Ear: External ear normal.  Left Ear: External ear normal.  Nose: Nose normal.  Mouth/Throat: Oropharynx is clear and moist.  Eyes: Conjunctivae and EOM are normal.  Small pterygium nasally on the right eye near  iris.  Neck: Neck supple.  Cardiovascular: Normal rate and regular rhythm.   Pulmonary/Chest: Effort normal and breath sounds normal.  Abdominal: Soft. Bowel sounds are normal.  Musculoskeletal: He exhibits deformity. He exhibits no edema.  Stiff joints in right middle and ring finger PIP joints. No deformities or grip weakness. Some stiffness in the left shoulder but able to reach over head. Deformities in the left foot with numbness plantar surface of metatarsal and intermittent sharp pains in the dorsum of the forefoot. Flat arch on the left with deformity of the first tarsal-metatarsal joint.  Neurological: He is alert and oriented to person, place, and time.  Skin: No rash noted.  Psychiatric: He has a normal mood and affect. His behavior is normal. Thought content normal.   Depression Screen PHQ 2/9 Scores 04/02/2017 04/02/2017 07/17/2015  PHQ - 2 Score 0 0 0  PHQ- 9 Score 0 - -    Assessment & Plan:     Routine Health Maintenance and Physical Exam  Exercise Activities and Dietary recommendations Goals    . Increase water intake          Recommend increasing water intake to 4-5 glasses a day.       Immunization History  Administered Date(s) Administered  . Zoster 06/19/2014    Health Maintenance  Topic Date Due  . TETANUS/TDAP  12/29/2026 (Originally 01/01/1946)  . PNA vac Low Risk Adult (1 of 2 - PCV13) 12/29/2026 (Originally 12/31/1993)  . INFLUENZA VACCINE  07/29/2017     Discussed health benefits of physical activity, and  encouraged him to engage in regular exercise appropriate for his age and condition.    -------------------------------------------------------------------- 1. Benign hypertension Tolerating cozaar 100 mg qd, Metoprolol Tartrate 25 mg 1/2 tablet BID and Amlodipine 10 mg qd. Occasionally has some edema and uses Lasix 20 mg prn qd. No chest pains, palpitations or dyspnea. Recheck labs and follow up pending reports. Annual wellness screening as documented above. - CBC with Differential/Platelet - Comprehensive metabolic panel  2. Adult hypothyroidism Continues Levothyroxine 50 mcg qd. No palpitations, tremor, constipation, brittle nails, hair loss or skin changes. Will recheck CMP, TSH and T4. Recheck pending reports. - Comprehensive metabolic panel - TSH - T4  3. Gout, unspecified cause, unspecified chronicity, unspecified site Continues to have frequent pains in feet. Still using Colcrys BID with flares and Feldene 20 mg qd to control inflammation. May need to consider adding Allopurinol pending lab reports. Encouraged purine restricted diet. - CBC with Differential/Platelet - Uric acid  4. Mixed hyperlipidemia Trying to follow low fat diet and continues outside activities to remain physically active. Due for labs to check levels and adjust treatment regimen. - Comprehensive metabolic panel - TSH - Lipid panel  5. Arthritis Stiffness in some fingers and left shoulder secondary to arthritis. Still using Feldene 20 mg qd. Has not stopped his physical activities mowing yards. Will recheck labs and follow up prn. - CBC with Differential/Platelet  6. Left foot pain Having burning and numbness in the toes and left forefoot. Suspect peripheral neuropathy. Schedule podiatry referral. - Ambulatory referral to [place]

## 2017-04-02 NOTE — Progress Notes (Signed)
Subjective:   Glenn Harmening Sr. is a 83 y.o. male who presents for an Initial Medicare Annual Wellness Visit.  Review of Systems  N/A  Cardiac Risk Factors include: advanced age (>1men, >59 women);dyslipidemia;hypertension;male gender    Objective:    Today's Vitals   04/02/17 0901 04/02/17 0923  BP: (!) 194/106 (!) 184/90  Pulse: 68   Temp: 97.9 F (36.6 C)   TempSrc: Oral   Weight: 164 lb 12.8 oz (74.8 kg)   Height: 6\' 1"  (1.854 m)   PainSc: 0-No pain    Body mass index is 21.74 kg/m.  Current Medications (verified) Outpatient Encounter Prescriptions as of 04/02/2017  Medication Sig  . amLODipine (NORVASC) 10 MG tablet Take 1 tablet (10 mg total) by mouth 2 (two) times daily.  Marland Kitchen COLCRYS 0.6 MG tablet TAKE 1 TABLET BY MOUTH TWICE DAILY.  . furosemide (LASIX) 20 MG tablet TAKE 1 TABLET BY MOUTH ONCE DAILY FOR EDEMA/BP (Patient taking differently: TAKE 1 TABLET BY MOUTH ONCE DAILY FOR EDEMA/BP PRN)  . levothyroxine (SYNTHROID, LEVOTHROID) 50 MCG tablet TAKE 1 TABLET BY MOUTH ONCE DAILY FOR THYROID, ON AN EMPTY STOMACH. WAIT 30 MINUTES BEFORE TAKING OTHER MEDS.  Marland Kitchen losartan (COZAAR) 100 MG tablet TAKE 1 TABLET BY MOUTH ONCE DAILY FOR BLOOD PRESSURE (HIGH)  . metoprolol tartrate (LOPRESSOR) 25 MG tablet Take 0.5 tablets (12.5 mg total) by mouth 2 (two) times daily.  . piroxicam (FELDENE) 20 MG capsule TAKE 1 CAPSULE BY MOUTH ONCE EVERY MORNING WITH FOOD FOR INFLAMMATIONOR PAIN  . carisoprodol (SOMA) 350 MG tablet Take 1 tablet (350 mg total) by mouth at bedtime.  . ranitidine (ZANTAC) 150 MG capsule Take 1 capsule (150 mg total) by mouth 2 (two) times daily. (Patient not taking: Reported on 04/02/2017)  . triamcinolone (KENALOG) 0.025 % cream TRIAMCINOLONE ACETONIDE, 0.025% (External Cream)  1 (one) Cream Cream apply daily as needed for 0 days  Quantity: 60;  Refills: 0   Ordered :13-Apr-2014  [name] ;  Started 13-Apr-2014 Active Comments: Medication taken as  needed.    No facility-administered encounter medications on file as of 04/02/2017.     Allergies (verified) Codeine and Cortizone-10  [hydrocortisone]   History: Past Medical History:  Diagnosis Date  . Arthritis   . GERD (gastroesophageal reflux disease)   . Hemorrhoid 1986  . Hyperlipidemia   . Hypertension   . Thyroid disease   . Vertigo    Past Surgical History:  Procedure Laterality Date  . APPENDECTOMY  1968  . BACK SURGERY    . EXCISIONAL HEMORRHOIDECTOMY    . INGUINAL HERNIA REPAIR Right 03/17/2016   Procedure: LAPAROSCOPIC INGUINAL HERNIA;  Surgeon: Florene Glen, MD;  Location: [facility] ORS;  Service: General;  Laterality: Right;  . SPINE SURGERY  1984   Family History  Problem Relation Age of Onset  . Alzheimer's disease Father   . Healthy Sister    Social History   Occupational History  . Not on file.   Social History Main Topics  . Smoking status: Former Smoker    Packs/day: 2.00    Years: 25.00    Quit date: 12/28/1965  . Smokeless tobacco: Never Used  . Alcohol use Yes     Comment: 1 beer occasionally  . Drug use: No  . Sexual activity: Not on file   Tobacco Counseling Counseling given: Not Answered   Activities of Daily Living In your present state of health, do you have any difficulty performing the following  activities: 04/02/2017  Hearing? N  Vision? N  Difficulty concentrating or making decisions? N  Walking or climbing stairs? N  Dressing or bathing? N  Doing errands, shopping? N  Preparing Food and eating ? N  Using the Toilet? N  In the past six months, have you accidently leaked urine? N  Do you have problems with loss of bowel control? N  Managing your Medications? N  Managing your Finances? N  Housekeeping or managing your Housekeeping? N  Some recent data might be hidden    Immunizations and Health Maintenance Immunization History  Administered Date(s) Administered  . Zoster 06/19/2014   There are no preventive care  reminders to display for this patient.  Patient Care Team: Margo Common, PA as PCP - General (Family Medicine)  Indicate any recent Medical Services you may have received from other than [facility] providers in the past year (date may be approximate).    Assessment:   This is a routine wellness examination for Glenn Jarvis.   Hearing/Vision screen Vision Screening Comments: Pt goes to [place] for vision checks. Last visit was 03/18.  Dietary issues and exercise activities discussed: Current Exercise Habits: The patient does not participate in regular exercise at present (yardwork), Exercise limited by: None identified  Goals    . Increase water intake          Recommend increasing water intake to 4-5 glasses a day.      Depression Screen PHQ 2/9 Scores 04/02/2017 04/02/2017 07/17/2015  PHQ - 2 Score 0 0 0  PHQ- 9 Score 0 - -    Fall Risk Fall Risk  04/02/2017 07/17/2015  Falls in the past year? No No    Cognitive Function:     6CIT Screen 04/02/2017  What Year? 0 points  What month? 0 points  What time? 0 points  Count back from 20 0 points  Months in reverse 0 points  Repeat phrase 4 points  Total Score 4    Screening Tests Health Maintenance  Topic Date Due  . TETANUS/TDAP  12/29/2026 (Originally 01/01/1946)  . PNA vac Low Risk Adult (1 of 2 - PCV13) 12/29/2026 (Originally 12/31/1993)  . INFLUENZA VACCINE  07/29/2017        Plan:  I have personally reviewed and addressed the Medicare Annual Wellness questionnaire and have noted the following in the patient's chart:  A. Medical and social history B. Use of alcohol, tobacco or illicit drugs  C. Current medications and supplements D. Functional ability and status E.  Nutritional status F.  Physical activity G. Advance directives H. List of other physicians I.  Hospitalizations, surgeries, and ER visits in previous 12 months J.  [place] such as hearing and vision if needed, cognitive and  depression L. Referrals and appointments - none  In addition, I have reviewed and discussed with patient certain preventive protocols, quality metrics, and best practice recommendations. A written personalized care plan for preventive services as well as general preventive health recommendations were provided to patient.  See attached scanned questionnaire for additional information.   Signed,  Fabio Neighbors, LPN Nurse Health Advisor   MD Recommendations: None. Pt would like to follow up with PCP for burning in both feet. Occurring at night time.    Reviewed the Health Advisor's note and was available for consultation. Agree with documentation and plan.

## 2017-04-03 LAB — CBC WITH DIFFERENTIAL/PLATELET
Basophils Absolute: 0 10*3/uL (ref 0.0–0.2)
Basos: 0 %
EOS (ABSOLUTE): 0.1 10*3/uL (ref 0.0–0.4)
Eos: 1 %
HEMATOCRIT: 33.1 % — AB (ref 37.5–51.0)
HEMOGLOBIN: 10.3 g/dL — AB (ref 13.0–17.7)
IMMATURE GRANS (ABS): 0 10*3/uL (ref 0.0–0.1)
Immature Granulocytes: 0 %
LYMPHS ABS: 1.8 10*3/uL (ref 0.7–3.1)
LYMPHS: 26 %
MCH: 28.3 pg (ref 26.6–33.0)
MCHC: 31.1 g/dL — ABNORMAL LOW (ref 31.5–35.7)
MCV: 91 fL (ref 79–97)
MONOCYTES: 10 %
Monocytes Absolute: 0.7 10*3/uL (ref 0.1–0.9)
Neutrophils Absolute: 4.3 10*3/uL (ref 1.4–7.0)
Neutrophils: 63 %
Platelets: 127 10*3/uL — ABNORMAL LOW (ref 150–379)
RBC: 3.64 x10E6/uL — AB (ref 4.14–5.80)
RDW: 14.1 % (ref 12.3–15.4)
WBC: 6.9 10*3/uL (ref 3.4–10.8)

## 2017-04-03 LAB — COMPREHENSIVE METABOLIC PANEL
ALT: 12 IU/L (ref 0–44)
AST: 17 IU/L (ref 0–40)
Albumin/Globulin Ratio: 1.3 (ref 1.2–2.2)
Albumin: 4 g/dL (ref 3.5–4.7)
Alkaline Phosphatase: 98 IU/L (ref 39–117)
BILIRUBIN TOTAL: 0.3 mg/dL (ref 0.0–1.2)
BUN / CREAT RATIO: 14 (ref 10–24)
BUN: 33 mg/dL — AB (ref 8–27)
CALCIUM: 8.9 mg/dL (ref 8.6–10.2)
CHLORIDE: 105 mmol/L (ref 96–106)
CO2: 24 mmol/L (ref 18–29)
Creatinine, Ser: 2.38 mg/dL — ABNORMAL HIGH (ref 0.76–1.27)
GFR calc non Af Amer: 23 mL/min/{1.73_m2} — ABNORMAL LOW (ref >59)
GFR, EST AFRICAN AMERICAN: 27 mL/min/{1.73_m2} — AB (ref >59)
GLOBULIN, TOTAL: 3 g/dL (ref 1.5–4.5)
GLUCOSE: 95 mg/dL (ref 65–99)
Potassium: 5.7 mmol/L — ABNORMAL HIGH (ref 3.5–5.2)
Sodium: 140 mmol/L (ref 134–144)
TOTAL PROTEIN: 7 g/dL (ref 6.0–8.5)

## 2017-04-03 LAB — URIC ACID: Uric Acid: 8.8 mg/dL — ABNORMAL HIGH (ref 3.7–8.6)

## 2017-04-03 LAB — LIPID PANEL
CHOLESTEROL TOTAL: 160 mg/dL (ref 100–199)
Chol/HDL Ratio: 3.2 ratio (ref 0.0–5.0)
HDL: 50 mg/dL (ref >39)
LDL CALC: 98 mg/dL (ref 0–99)
TRIGLYCERIDES: 58 mg/dL (ref 0–149)
VLDL CHOLESTEROL CAL: 12 mg/dL (ref 5–40)

## 2017-04-03 LAB — T4: T4, Total: 6.4 ug/dL (ref 4.5–12.0)

## 2017-04-03 LAB — TSH: TSH: 8.59 u[IU]/mL — ABNORMAL HIGH (ref 0.450–4.500)

## 2017-04-16 DIAGNOSIS — D631 Anemia in chronic kidney disease: Secondary | ICD-10-CM | POA: Diagnosis not present

## 2017-04-16 DIAGNOSIS — R809 Proteinuria, unspecified: Secondary | ICD-10-CM | POA: Diagnosis not present

## 2017-04-16 DIAGNOSIS — I10 Essential (primary) hypertension: Secondary | ICD-10-CM | POA: Diagnosis not present

## 2017-04-16 DIAGNOSIS — N183 Chronic kidney disease, stage 3 (moderate): Secondary | ICD-10-CM | POA: Diagnosis not present

## 2017-04-16 DIAGNOSIS — N184 Chronic kidney disease, stage 4 (severe): Secondary | ICD-10-CM | POA: Diagnosis not present

## 2017-04-20 ENCOUNTER — Ambulatory Visit: Payer: Medicare HMO | Admitting: Family Medicine

## 2017-04-20 NOTE — Progress Notes (Deleted)
   Patient: Glenn Jarvis. Male    DOB: 1927-05-14   83 y.o.   MRN: [id number] Visit Date: 04/20/2017  Today's Provider: Vernie Murders, PA   No chief complaint on file.  Subjective:    HPI  Hypertension, follow-up:  BP Readings from Last 3 Encounters:  04/02/17 (!) 184/90  04/02/17 (!) 184/90  11/18/16 (!) 164/84    He was last seen for hypertension {NUMBERS 1-12:18279} {days/wks/mos/yrs:310907} ago.  BP at that visit was ***. Management changes since that visit include ***. He reports {excellent/good/fair/poor:19665} compliance with treatment. He {ACTION; IS/IS YSA:63016010} having side effects. *** He {is/is not:9024} exercising. He {is/is not:9024} adherent to low salt diet.   Outside blood pressures are ***. He is experiencing {Symptoms; cardiac:12860}.  Patient denies {Symptoms; cardiac:12860}.   Cardiovascular risk factors include {cv risk factors:510}.  Use of agents associated with hypertension: {bp agents assoc with hypertension:511::"none"}.     Weight trend: {trend:16658} Wt Readings from Last 3 Encounters:  04/02/17 164 lb 12.8 oz (74.8 kg)  04/02/17 164 lb 12.8 oz (74.8 kg)  11/18/16 163 lb 6.4 oz (74.1 kg)    Current diet: {diet habits:16563}  ------------------------------------------------------------------------    Previous Medications   AMLODIPINE (NORVASC) 10 MG TABLET    Take 1 tablet (10 mg total) by mouth 2 (two) times daily.   CARISOPRODOL (SOMA) 350 MG TABLET    Take 1 tablet (350 mg total) by mouth at bedtime.   COLCRYS 0.6 MG TABLET    TAKE 1 TABLET BY MOUTH TWICE DAILY.   FUROSEMIDE (LASIX) 20 MG TABLET    TAKE 1 TABLET BY MOUTH ONCE DAILY FOR EDEMA/BP   LEVOTHYROXINE (SYNTHROID, LEVOTHROID) 50 MCG TABLET    TAKE 1 TABLET BY MOUTH ONCE DAILY FOR THYROID, ON AN EMPTY STOMACH. WAIT 30 MINUTES BEFORE TAKING OTHER MEDS.   LOSARTAN (COZAAR) 100 MG TABLET    TAKE 1 TABLET BY MOUTH ONCE DAILY FOR BLOOD PRESSURE (HIGH)   METOPROLOL TARTRATE  (LOPRESSOR) 25 MG TABLET    Take 0.5 tablets (12.5 mg total) by mouth 2 (two) times daily.   PIROXICAM (FELDENE) 20 MG CAPSULE    TAKE 1 CAPSULE BY MOUTH ONCE EVERY MORNING WITH FOOD FOR INFLAMMATIONOR PAIN   RANITIDINE (ZANTAC) 150 MG CAPSULE    Take 1 capsule (150 mg total) by mouth 2 (two) times daily.   TRIAMCINOLONE (KENALOG) 0.025 % CREAM    TRIAMCINOLONE ACETONIDE, 0.025% (External Cream)  1 (one) Cream Cream apply daily as needed for 0 days  Quantity: 60;  Refills: 0   Ordered :13-Apr-2014  [name] ;  Started 13-Apr-2014 Active Comments: Medication taken as needed.     Review of Systems  Constitutional: Negative.   Respiratory: Negative.   Cardiovascular: Negative.     Social History  Substance Use Topics  . Smoking status: Former Smoker    Packs/day: 2.00    Years: 25.00    Quit date: 12/28/1965  . Smokeless tobacco: Never Used  . Alcohol use Yes     Comment: 1 beer occasionally   Objective:   There were no vitals taken for this visit.  Physical Exam      Assessment & Plan:       Follow up: No Follow-up on file.

## 2017-04-21 ENCOUNTER — Ambulatory Visit (INDEPENDENT_AMBULATORY_CARE_PROVIDER_SITE_OTHER): Payer: Medicare HMO

## 2017-04-21 ENCOUNTER — Ambulatory Visit (INDEPENDENT_AMBULATORY_CARE_PROVIDER_SITE_OTHER): Payer: Medicare HMO | Admitting: Podiatry

## 2017-04-21 ENCOUNTER — Encounter: Payer: Self-pay | Admitting: Podiatry

## 2017-04-21 DIAGNOSIS — G629 Polyneuropathy, unspecified: Secondary | ICD-10-CM | POA: Diagnosis not present

## 2017-04-21 DIAGNOSIS — M7752 Other enthesopathy of left foot: Secondary | ICD-10-CM

## 2017-04-21 DIAGNOSIS — M79672 Pain in left foot: Secondary | ICD-10-CM

## 2017-04-21 MED ORDER — NONFORMULARY OR COMPOUNDED ITEM: 2 refills | Status: DC

## 2017-04-21 MED ORDER — GABAPENTIN 100 MG PO CAPS: 100.0000 mg | ORAL_CAPSULE | Freq: Three times a day (TID) | ORAL | 3 refills | Status: DC

## 2017-04-22 ENCOUNTER — Ambulatory Visit: Payer: Commercial Managed Care - HMO

## 2017-04-22 NOTE — Progress Notes (Signed)
   HPI: Patient is an 83 year old male presenting today as a new patient complaining of aching, burning pain in the left foot and left fifth toe that has been present for several years. He states the pain is worse at night and feels like an "electric shock" in the foot. He covers touching the foot increases pain. He has not done anything to treat the symptoms.    Physical Exam: General: The patient is alert and oriented x3 in no acute distress.  Dermatology: Skin is warm, dry and supple bilateral lower extremities. Negative for open lesions or macerations.  Vascular: Palpable pedal pulses bilaterally. No edema or erythema noted. Capillary refill within normal limits.  Neurological: Epicritic and protective threshold grossly intact bilaterally.   Musculoskeletal Exam: Pain with palpation to the second MPJ of the left foot. Pain with palpation to bilateral fifth toes. Range of motion within normal limits to all pedal and ankle joints bilateral. Muscle strength 5/5 in all groups bilateral.   Radiographic Exam:  Normal osseous mineralization. Joint spaces preserved. No fracture/dislocation/boney destruction.    Assessment: 1. Neuropathy bilaterally-non DM 2. Capsulitis second MPJ left 3. Bilateral fifth toe pain   Plan of Care:  1. Patient was evaluated. X-rays reviewed. 2. Prescription for neuropathy pain cream to be dispensed from [facility]. 3. Prescription for gabapentin 100 mg 1 by mouth daily at bedtime. 4. Injection of 0.5 mL Celestone Soluspan injected into the second MPJ of the left foot. 5. Silicone toe caps dispensed. 6. Return to clinic in 4 weeks.   Edrick Kins, DPM [facility]  Dr. Edrick Kins, [address]                                        [place], [place] [address]                Office [phone]  Fax [phone]

## 2017-04-26 MED ORDER — BETAMETHASONE SOD PHOS & ACET 6 (3-3) MG/ML IJ SUSP: 3.0000 mg | Freq: Once | INTRAMUSCULAR | Status: DC

## 2017-04-27 ENCOUNTER — Other Ambulatory Visit: Payer: Self-pay

## 2017-04-27 ENCOUNTER — Telehealth: Payer: Self-pay | Admitting: Podiatry

## 2017-04-27 MED ORDER — TRAMADOL HCL 50 MG PO TABS: 50.0000 mg | ORAL_TABLET | Freq: Four times a day (QID) | ORAL | 0 refills | Status: DC | PRN

## 2017-04-27 NOTE — Addendum Note (Signed)
Addended by: Harriett Sine D on: 04/27/2017 02:33 PM   Modules accepted: Orders

## 2017-04-27 NOTE — Telephone Encounter (Addendum)
Left message informing Ms Glenn Jarvis, Dr. Amalia Hailey had ordered Tramadol and it would be called to the [facility]. Faxed Rx to [facility].

## 2017-04-27 NOTE — Progress Notes (Signed)
Per Dr. Amalia Hailey  rx for Tramadol 50mg  #30 has been printed off and left at front desk for patient to pick up

## 2017-04-27 NOTE — Telephone Encounter (Signed)
Last week by Dr, Amalia Hailey. Treated for gout,. Was given and injections but his pain is worse. Pt wanting to know if Dr. Amalia Hailey can call in something for pain? [facility], [place].

## 2017-04-27 NOTE — Telephone Encounter (Signed)
Please call in Tramadol 50mg  #30 no refills.  Thanks, Dr. Amalia Hailey

## 2017-05-12 ENCOUNTER — Ambulatory Visit: Payer: Medicare HMO | Admitting: Podiatry

## 2017-05-12 DIAGNOSIS — M19072 Primary osteoarthritis, left ankle and foot: Secondary | ICD-10-CM | POA: Diagnosis not present

## 2017-05-12 MED ORDER — METHYLPREDNISOLONE 4 MG PO TBPK: ORAL_TABLET | ORAL | 0 refills | Status: DC

## 2017-05-12 MED ORDER — TRAMADOL HCL 50 MG PO TABS: 50.0000 mg | ORAL_TABLET | Freq: Three times a day (TID) | ORAL | 0 refills | Status: DC | PRN

## 2017-05-13 NOTE — Progress Notes (Signed)
   HPI: Patient is an 83 year old male presenting today for follow-up evaluation of capsulitis of the second MPJ of the left foot. He reports worsening pain on the left foot since getting the injection at the last visit.    Physical Exam: General: The patient is alert and oriented x3 in no acute distress.  Dermatology: Skin is warm, dry and supple bilateral lower extremities. Negative for open lesions or macerations.  Vascular: Palpable pedal pulses bilaterally. No edema or erythema noted. Capillary refill within normal limits.  Neurological: Epicritic and protective threshold grossly intact bilaterally.   Musculoskeletal Exam: Pain with palpation throughout the left midfoot and second MPJ. Range of motion within normal limits to all pedal and ankle joints bilateral. Muscle strength 5/5 in all groups bilateral.   Assessment: 1. 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 Patient was evaluated.  2. Prescription for tramadol 50 mg given to patient. 3. Prescription for Medrol Dosepak even to patient.  4. Prescription for wheelchair given to patient. 5. Return to clinic in 4 weeks.   Edrick Kins, DPM [facility]  Dr. Edrick Kins, [address]                                        [place], [place] [address]                Office [phone]  Fax [phone]

## 2017-05-19 ENCOUNTER — Ambulatory Visit: Payer: Medicare HMO | Admitting: Podiatry

## 2017-05-26 ENCOUNTER — Telehealth: Payer: Self-pay | Admitting: Family Medicine

## 2017-05-26 ENCOUNTER — Encounter: Payer: Self-pay | Admitting: Family Medicine

## 2017-05-26 ENCOUNTER — Ambulatory Visit (INDEPENDENT_AMBULATORY_CARE_PROVIDER_SITE_OTHER): Payer: Medicare HMO | Admitting: Family Medicine

## 2017-05-26 VITALS — BP 142/86 | HR 56 | Temp 97.7°F | Wt 152.0 lb

## 2017-05-26 DIAGNOSIS — D649 Anemia, unspecified: Secondary | ICD-10-CM | POA: Diagnosis not present

## 2017-05-26 DIAGNOSIS — R5383 Other fatigue: Secondary | ICD-10-CM | POA: Diagnosis not present

## 2017-05-26 DIAGNOSIS — N183 Chronic kidney disease, stage 3 unspecified: Secondary | ICD-10-CM

## 2017-05-26 DIAGNOSIS — M109 Gout, unspecified: Secondary | ICD-10-CM | POA: Diagnosis not present

## 2017-05-26 DIAGNOSIS — E039 Hypothyroidism, unspecified: Secondary | ICD-10-CM | POA: Diagnosis not present

## 2017-05-26 DIAGNOSIS — I10 Essential (primary) hypertension: Secondary | ICD-10-CM

## 2017-05-26 DIAGNOSIS — C61 Malignant neoplasm of prostate: Secondary | ICD-10-CM | POA: Diagnosis not present

## 2017-05-26 LAB — IFOBT (OCCULT BLOOD): IFOBT: POSITIVE

## 2017-05-26 NOTE — Progress Notes (Signed)
Patient: Glenn Borboa Sr. Male    DOB: 1927-10-20   83 y.o.   MRN: [id number] Visit Date: 05/26/2017  Today's Provider: Vernie Murders, PA   Chief Complaint  Patient presents with  . Fatigue   Subjective:    HPI Weakness/Fatigue:  This is a new problem. Episode onset: couple weeks ago. The problem has been gradually improving. Associated symptoms include fatigue and weakness. Associated symptoms comments: Loss of appetite and weight change. Nothing aggravates the symptoms. Patient states he stopped taking Levothyroxine 2-3 weeks prior to symptoms starting. He recently started back taking medication 2-3 days ago.   Patient Active Problem List   Diagnosis Date Noted  . Edema 10/05/2015  . Allergic rhinitis 07/05/2015  . Arthritis 07/05/2015  . Benign hypertension 07/05/2015  . Chronic kidney disease (CKD), stage III (moderate) 07/05/2015  . Acid reflux 07/05/2015  . Gout 07/05/2015  . HLD (hyperlipidemia) 07/05/2015  . Adult hypothyroidism 07/05/2015  . Cannot sleep 07/05/2015  . Hernia, inguinal 07/05/2015  . LBP (low back pain) 07/05/2015  . Cramp in muscle 07/05/2015  . Leg paresthesia 07/05/2015  . CA of prostate ([place]) 07/05/2015  . Cutaneous eruption 07/05/2015  . Head revolving around 07/05/2015  . Hypothyroidism 07/03/2015  . Right inguinal hernia 05/30/2014   Past Surgical History:  Procedure Laterality Date  . APPENDECTOMY  1968  . BACK SURGERY    . EXCISIONAL HEMORRHOIDECTOMY    . INGUINAL HERNIA REPAIR Right 03/17/2016   Procedure: LAPAROSCOPIC INGUINAL HERNIA;  Surgeon: Florene Glen, MD;  Location: [facility] ORS;  Service: General;  Laterality: Right;  . SPINE SURGERY  1984   Family History  Problem Relation Age of Onset  . Alzheimer's disease Father   . Healthy Sister      Previous Medications   AMLODIPINE (NORVASC) 10 MG TABLET    Take 1 tablet (10 mg total) by mouth 2 (two) times daily.   CARISOPRODOL (SOMA) 350 MG TABLET    Take 1 tablet (350 mg  total) by mouth at bedtime.   COLCRYS 0.6 MG TABLET    TAKE 1 TABLET BY MOUTH TWICE DAILY.   FUROSEMIDE (LASIX) 20 MG TABLET    TAKE 1 TABLET BY MOUTH ONCE DAILY FOR EDEMA/BP   GABAPENTIN (NEURONTIN) 100 MG CAPSULE    Take 1 capsule (100 mg total) by mouth 3 (three) times daily.   LEVOTHYROXINE (SYNTHROID, LEVOTHROID) 50 MCG TABLET    TAKE 1 TABLET BY MOUTH ONCE DAILY FOR THYROID, ON AN EMPTY STOMACH. WAIT 30 MINUTES BEFORE TAKING OTHER MEDS.   LOSARTAN (COZAAR) 100 MG TABLET    TAKE 1 TABLET BY MOUTH ONCE DAILY FOR BLOOD PRESSURE (HIGH)   METOPROLOL TARTRATE (LOPRESSOR) 25 MG TABLET    Take 0.5 tablets (12.5 mg total) by mouth 2 (two) times daily.   NONFORMULARY OR COMPOUNDED ITEM    See pharmacy note   PIROXICAM (FELDENE) 20 MG CAPSULE    TAKE 1 CAPSULE BY MOUTH ONCE EVERY MORNING WITH FOOD FOR INFLAMMATIONOR PAIN   RANITIDINE (ZANTAC) 150 MG CAPSULE    Take 1 capsule (150 mg total) by mouth 2 (two) times daily.   TRAMADOL (ULTRAM) 50 MG TABLET    Take 1 tablet (50 mg total) by mouth every 8 (eight) hours as needed.   TRIAMCINOLONE (KENALOG) 0.025 % CREAM    TRIAMCINOLONE ACETONIDE, 0.025% (External Cream)  1 (one) Cream Cream apply daily as needed for 0 days  Quantity: 60;  Refills: 0   Ordered :13-Apr-2014  [name] ;  Started 13-Apr-2014 Active Comments: Medication taken as needed.     Review of Systems  Constitutional: Positive for activity change, appetite change, fatigue and unexpected weight change.  Respiratory: Negative.   Cardiovascular: Negative.     Social History  Substance Use Topics  . Smoking status: Former Smoker    Packs/day: 2.00    Years: 25.00    Quit date: 12/28/1965  . Smokeless tobacco: Never Used  . Alcohol use Yes     Comment: 1 beer occasionally   Objective:   BP (!) 142/86 (BP Location: Right Arm, Patient Position: Sitting, Cuff Size: Normal)   Pulse (!) 56   Temp 97.7 F (36.5 C) (Oral)   Wt 152 lb (68.9 kg)   SpO2 98%   BMI 20.05  kg/m   Physical Exam  Constitutional: He is oriented to person, place, and time. He appears well-developed and well-nourished. No distress.  HENT:  Head: Normocephalic and atraumatic.  Right Ear: Hearing normal.  Left Ear: Hearing normal.  Nose: Nose normal.  Eyes: Conjunctivae and lids are normal. Right eye exhibits no discharge. Left eye exhibits no discharge. No scleral icterus.  Neck: Neck supple. No thyromegaly present.  Cardiovascular: Normal rate and regular rhythm.   Pulmonary/Chest: Effort normal and breath sounds normal. No respiratory distress.  Abdominal: Soft. Bowel sounds are normal.  Musculoskeletal: Normal range of motion.  Neurological: He is alert and oriented to person, place, and time.  Skin: Skin is intact. No lesion and no rash noted.  Psychiatric: He has a normal mood and affect. His speech is normal and behavior is normal. Thought content normal.      Assessment & Plan:     1. Fatigue, unspecified type Onset over the past couple weeks. History of Hgb 10.3 on 04-02-17. No melena or hematochezia. No hematuria or hematemesis. Stopped taking his Levothyroxine and has restarted. Will check labs for further drop in Hgb, check stools for occult blood and check for iron deficiency. Recheck pending reports. - CBC with Differential/Platelet - Comprehensive metabolic panel - Iron - Iron and TIBC - T4 - TSH - T3 - IFOBT POC (occult bld, rslt in office); Future   2. Hypothyroidism, unspecified type Stopped taking his Levothyroxine for a while and noticed more fatigue. Will recheck labs and encouraged to take Levothyroxine 50 mcg qd regularly.  - CBC with Differential/Platelet - Comprehensive metabolic panel - T4 - TSH - T3  3. Chronic kidney disease (CKD), stage III (moderate) Followed by Dr. Holley Raring (nephrologist) and was given Hydralazine 25 mg TID by him for BP control. Will recheck for anemia and changes in renal function adding to fatigue symptom. Recheck with  Dr. Holley Raring as planned. - CBC with Differential/Platelet - Comprehensive metabolic panel - Iron - Iron and TIBC  4. Benign hypertension Still taking Furosemide, Losartan and Metoprolol with Hydralazine. No longer taking the Amlodipine. Recheck labs and follow up with nephrologist. - CBC with Differential/Platelet - Comprehensive metabolic panel  5. Gout, unspecified cause, unspecified chronicity, unspecified site No acute attacks recently. Occasionally uses Colcrys for attacks with Feldene for arthritis and gout. Recommend purine restricted diet. Recheck prn.

## 2017-05-26 NOTE — Telephone Encounter (Signed)
Acknowledged and patient seen today.

## 2017-05-26 NOTE — Telephone Encounter (Signed)
Pt. Daughter, [name], called to let you know that Glenn Jarvis had not been taking his thyroid medication.  He has no energy, very weak, no appetite.     She just realized he hadnt been taking it a week ago and since starting back, he has been getting a little better.  He just started walking again Friday and eating more.

## 2017-05-27 LAB — COMPREHENSIVE METABOLIC PANEL
ALT: 19 IU/L (ref 0–44)
AST: 24 IU/L (ref 0–40)
Albumin/Globulin Ratio: 1 — ABNORMAL LOW (ref 1.2–2.2)
Albumin: 3.4 g/dL — ABNORMAL LOW (ref 3.5–4.7)
Alkaline Phosphatase: 90 IU/L (ref 39–117)
BUN/Creatinine Ratio: 21 (ref 10–24)
BUN: 65 mg/dL — AB (ref 8–27)
Bilirubin Total: 0.3 mg/dL (ref 0.0–1.2)
CALCIUM: 8.6 mg/dL (ref 8.6–10.2)
CO2: 22 mmol/L (ref 18–29)
CREATININE: 3.08 mg/dL — AB (ref 0.76–1.27)
Chloride: 102 mmol/L (ref 96–106)
GFR calc Af Amer: 20 mL/min/{1.73_m2} — ABNORMAL LOW (ref >59)
GFR, EST NON AFRICAN AMERICAN: 17 mL/min/{1.73_m2} — AB (ref >59)
GLUCOSE: 133 mg/dL — AB (ref 65–99)
Globulin, Total: 3.3 g/dL (ref 1.5–4.5)
POTASSIUM: 4.8 mmol/L (ref 3.5–5.2)
Sodium: 137 mmol/L (ref 134–144)
Total Protein: 6.7 g/dL (ref 6.0–8.5)

## 2017-05-27 LAB — IRON AND TIBC
IRON: 54 ug/dL (ref 38–169)
Iron Saturation: 28 % (ref 15–55)
Total Iron Binding Capacity: 192 ug/dL — ABNORMAL LOW (ref 250–450)
UIBC: 138 ug/dL (ref 111–343)

## 2017-05-27 LAB — CBC WITH DIFFERENTIAL/PLATELET
BASOS ABS: 0 10*3/uL (ref 0.0–0.2)
Basos: 0 %
EOS (ABSOLUTE): 0.1 10*3/uL (ref 0.0–0.4)
Eos: 1 %
Hematocrit: 30.3 % — ABNORMAL LOW (ref 37.5–51.0)
Hemoglobin: 10.1 g/dL — ABNORMAL LOW (ref 13.0–17.7)
IMMATURE GRANS (ABS): 0 10*3/uL (ref 0.0–0.1)
IMMATURE GRANULOCYTES: 0 %
LYMPHS: 33 %
Lymphocytes Absolute: 1.7 10*3/uL (ref 0.7–3.1)
MCH: 28.5 pg (ref 26.6–33.0)
MCHC: 33.3 g/dL (ref 31.5–35.7)
MCV: 86 fL (ref 79–97)
MONOCYTES: 7 %
Monocytes Absolute: 0.4 10*3/uL (ref 0.1–0.9)
NEUTROS PCT: 59 %
Neutrophils Absolute: 3 10*3/uL (ref 1.4–7.0)
PLATELETS: 73 10*3/uL — AB (ref 150–379)
RBC: 3.54 x10E6/uL — ABNORMAL LOW (ref 4.14–5.80)
RDW: 13.3 % (ref 12.3–15.4)
WBC: 5.2 10*3/uL (ref 3.4–10.8)

## 2017-05-27 LAB — T4: T4, Total: 8.6 ug/dL (ref 4.5–12.0)

## 2017-05-27 LAB — TSH: TSH: 1.77 u[IU]/mL (ref 0.450–4.500)

## 2017-05-27 LAB — T3: T3 TOTAL: 81 ng/dL (ref 71–180)

## 2017-05-27 NOTE — Telephone Encounter (Signed)
Patients daughter called office to inquire about lab results, I informed her that they are not in yet and that P.A is out of office and will review and advise once he has seen report. Yolanda request that she be called at work when labs are in, she ask for call back at [phone]. KW

## 2017-05-28 ENCOUNTER — Other Ambulatory Visit: Payer: Self-pay | Admitting: Family Medicine

## 2017-05-28 DIAGNOSIS — R195 Other fecal abnormalities: Secondary | ICD-10-CM

## 2017-05-28 NOTE — Telephone Encounter (Signed)
Advised daughter Glenn Jarvis) to keep his appointment with Dr. Holley Raring (nephrologist) next week to review creatinine and we will schedule GI referral for occult blood in stools with anemia. She understands and agrees with plan.

## 2017-06-16 ENCOUNTER — Encounter: Payer: Self-pay | Admitting: Podiatry

## 2017-06-16 ENCOUNTER — Ambulatory Visit (INDEPENDENT_AMBULATORY_CARE_PROVIDER_SITE_OTHER): Payer: Medicare HMO | Admitting: Podiatry

## 2017-06-16 DIAGNOSIS — M19072 Primary osteoarthritis, left ankle and foot: Secondary | ICD-10-CM

## 2017-06-18 ENCOUNTER — Ambulatory Visit: Payer: Medicare HMO | Admitting: Family Medicine

## 2017-06-18 NOTE — Progress Notes (Deleted)
   Patient: Glenn Strothers Sr. Male    DOB: 06-05-1927   83 y.o.   MRN: [id number] Visit Date: 06/18/2017  Today's Provider: Vernie Murders, PA   No chief complaint on file.  Subjective:    Hip Pain        Previous Medications   AMLODIPINE (NORVASC) 10 MG TABLET    Take 1 tablet (10 mg total) by mouth 2 (two) times daily.   CARISOPRODOL (SOMA) 350 MG TABLET    Take 1 tablet (350 mg total) by mouth at bedtime.   COLCRYS 0.6 MG TABLET    TAKE 1 TABLET BY MOUTH TWICE DAILY.   FUROSEMIDE (LASIX) 20 MG TABLET    TAKE 1 TABLET BY MOUTH ONCE DAILY FOR EDEMA/BP   GABAPENTIN (NEURONTIN) 100 MG CAPSULE    Take 1 capsule (100 mg total) by mouth 3 (three) times daily.   LEVOTHYROXINE (SYNTHROID, LEVOTHROID) 50 MCG TABLET    TAKE 1 TABLET BY MOUTH ONCE DAILY FOR THYROID, ON AN EMPTY STOMACH. WAIT 30 MINUTES BEFORE TAKING OTHER MEDS.   LOSARTAN (COZAAR) 100 MG TABLET    TAKE 1 TABLET BY MOUTH ONCE DAILY FOR BLOOD PRESSURE (HIGH)   METOPROLOL TARTRATE (LOPRESSOR) 25 MG TABLET    Take 0.5 tablets (12.5 mg total) by mouth 2 (two) times daily.   NONFORMULARY OR COMPOUNDED ITEM    See pharmacy note   PIROXICAM (FELDENE) 20 MG CAPSULE    TAKE 1 CAPSULE BY MOUTH ONCE EVERY MORNING WITH FOOD FOR INFLAMMATIONOR PAIN   RANITIDINE (ZANTAC) 150 MG CAPSULE    Take 1 capsule (150 mg total) by mouth 2 (two) times daily.   TRAMADOL (ULTRAM) 50 MG TABLET    Take 1 tablet (50 mg total) by mouth every 8 (eight) hours as needed.   TRIAMCINOLONE (KENALOG) 0.025 % CREAM    TRIAMCINOLONE ACETONIDE, 0.025% (External Cream)  1 (one) Cream Cream apply daily as needed for 0 days  Quantity: 60;  Refills: 0   Ordered :13-Apr-2014  [name] ;  Started 13-Apr-2014 Active Comments: Medication taken as needed.     Review of Systems  Constitutional: Negative.   Respiratory: Negative.   Cardiovascular: Negative.   Musculoskeletal: Positive for arthralgias.    Social History  Substance Use Topics  . Smoking  status: Former Smoker    Packs/day: 2.00    Years: 25.00    Quit date: 12/28/1965  . Smokeless tobacco: Never Used  . Alcohol use Yes     Comment: 1 beer occasionally   Objective:   There were no vitals taken for this visit.  Physical Exam      Assessment & Plan:       Follow up: No Follow-up on file.

## 2017-06-20 NOTE — Progress Notes (Signed)
   HPI: Patient is an 83 year old male presenting today for follow-up evaluation of capsulitis of the second MPJ of the left foot. He states his pain has significantly improved. He reports some mild pain in the left fifth toe but reports it is also much better. He reports some continued associated swelling. He denies any new complaints at this time.    Physical Exam: General: The patient is alert and oriented x3 in no acute distress.  Dermatology: Skin is warm, dry and supple bilateral lower extremities. Negative for open lesions or macerations.  Vascular: Palpable pedal pulses bilaterally. No edema or erythema noted. Capillary refill within normal limits.  Neurological: Epicritic and protective threshold grossly intact bilaterally.   Musculoskeletal Exam: Pain with palpation throughout the left midfoot and second MPJ. Range of motion within normal limits to all pedal and ankle joints bilateral. Muscle strength 5/5 in all groups bilateral.   Assessment: 1. Left midfoot DJD  Plan of Care:  1. Patient was evaluated.  2. Continue wearing good shoe gear. 3. Return to clinic when necessary.  Edrick Kins, DPM [facility]  Dr. Edrick Kins, [address]                                        [place], [place] [address]                Office [phone]  Fax [phone]

## 2017-06-25 ENCOUNTER — Encounter: Payer: Self-pay | Admitting: Family Medicine

## 2017-06-25 ENCOUNTER — Ambulatory Visit
Admission: RE | Admit: 2017-06-25 | Discharge: 2017-06-25 | Disposition: A | Discharge status: Home / Self Care | Payer: Medicare HMO | Source: Ambulatory Visit | Attending: Family Medicine | Admitting: Family Medicine

## 2017-06-25 ENCOUNTER — Ambulatory Visit (INDEPENDENT_AMBULATORY_CARE_PROVIDER_SITE_OTHER): Payer: Medicare HMO | Admitting: Family Medicine

## 2017-06-25 ENCOUNTER — Telehealth: Payer: Self-pay | Admitting: Family Medicine

## 2017-06-25 VITALS — BP 150/90 | HR 60 | Temp 97.7°F | Resp 16 | Wt 163.0 lb

## 2017-06-25 DIAGNOSIS — N183 Chronic kidney disease, stage 3 unspecified: Secondary | ICD-10-CM

## 2017-06-25 DIAGNOSIS — M1612 Unilateral primary osteoarthritis, left hip: Secondary | ICD-10-CM | POA: Insufficient documentation

## 2017-06-25 DIAGNOSIS — M25552 Pain in left hip: Secondary | ICD-10-CM | POA: Diagnosis not present

## 2017-06-25 NOTE — Progress Notes (Signed)
Patient: Glenn Lemme Sr. Male    DOB: Jun 10, 1927   83 y.o.   MRN: [id number] Visit Date: 06/25/2017  Today's Provider: Vernie Murders, PA   Chief Complaint  Patient presents with  . Hip Pain   Subjective:    Patient has had intermittent left hip pain. Patient states he thinks having gas pains.    Hip Pain   The incident occurred 5 to 7 days ago (1 week ago). The incident occurred at home. There was no injury mechanism. The pain is present in the left hip. The quality of the pain is described as cramping. The pain is at a severity of 4/10. The pain is mild. The pain has been intermittent since onset. He reports no foreign bodies present. Nothing aggravates the symptoms. He has tried nothing for the symptoms.   Patient Active Problem List   Diagnosis Date Noted  . Edema 10/05/2015  . Allergic rhinitis 07/05/2015  . Arthritis 07/05/2015  . Benign hypertension 07/05/2015  . Chronic kidney disease (CKD), stage III (moderate) 07/05/2015  . Acid reflux 07/05/2015  . Gout 07/05/2015  . HLD (hyperlipidemia) 07/05/2015  . Adult hypothyroidism 07/05/2015  . Cannot sleep 07/05/2015  . Hernia, inguinal 07/05/2015  . LBP (low back pain) 07/05/2015  . Cramp in muscle 07/05/2015  . Leg paresthesia 07/05/2015  . CA of prostate ([place]) 07/05/2015  . Cutaneous eruption 07/05/2015  . Head revolving around 07/05/2015  . Hypothyroidism 07/03/2015  . Right inguinal hernia 05/30/2014   Past Surgical History:  Procedure Laterality Date  . APPENDECTOMY  1968  . BACK SURGERY    . EXCISIONAL HEMORRHOIDECTOMY    . INGUINAL HERNIA REPAIR Right 03/17/2016   Procedure: LAPAROSCOPIC INGUINAL HERNIA;  Surgeon: Florene Glen, MD;  Location: [facility] ORS;  Service: General;  Laterality: Right;  . SPINE SURGERY  1984   Family History  Problem Relation Age of Onset  . Alzheimer's disease Father   . Healthy Sister    Allergies  Allergen Reactions  . Codeine   . Cortizone-10   [Hydrocortisone]     Current Outpatient Prescriptions:  .  amLODipine (NORVASC) 10 MG tablet, Take 1 tablet (10 mg total) by mouth 2 (two) times daily., Disp: 60 tablet, Rfl: 3 .  carisoprodol (SOMA) 350 MG tablet, Take 1 tablet (350 mg total) by mouth at bedtime., Disp: 30 tablet, Rfl: 2 .  COLCRYS 0.6 MG tablet, TAKE 1 TABLET BY MOUTH TWICE DAILY., Disp: 40 tablet, Rfl: 5 .  furosemide (LASIX) 20 MG tablet, TAKE 1 TABLET BY MOUTH ONCE DAILY FOR EDEMA/BP (Patient taking differently: TAKE 1 TABLET BY MOUTH ONCE DAILY FOR EDEMA/BP PRN), Disp: 90 tablet, Rfl: 3 .  gabapentin (NEURONTIN) 100 MG capsule, Take 1 capsule (100 mg total) by mouth 3 (three) times daily., Disp: 90 capsule, Rfl: 3 .  levothyroxine (SYNTHROID, LEVOTHROID) 50 MCG tablet, TAKE 1 TABLET BY MOUTH ONCE DAILY FOR THYROID, ON AN EMPTY STOMACH. WAIT 30 MINUTES BEFORE TAKING OTHER MEDS., Disp: 90 tablet, Rfl: 3 .  losartan (COZAAR) 100 MG tablet, TAKE 1 TABLET BY MOUTH ONCE DAILY FOR BLOOD PRESSURE (HIGH), Disp: 90 tablet, Rfl: 3 .  metoprolol tartrate (LOPRESSOR) 25 MG tablet, Take 0.5 tablets (12.5 mg total) by mouth 2 (two) times daily., Disp: 90 tablet, Rfl: 3 .  NONFORMULARY OR COMPOUNDED ITEM, See pharmacy note, Disp: 120 each, Rfl: 2 .  piroxicam (FELDENE) 20 MG capsule, TAKE 1 CAPSULE BY MOUTH ONCE EVERY MORNING WITH FOOD  FOR INFLAMMATIONOR PAIN, Disp: 30 capsule, Rfl: 5 .  ranitidine (ZANTAC) 150 MG capsule, Take 1 capsule (150 mg total) by mouth 2 (two) times daily., Disp: 60 capsule, Rfl: 3 .  traMADol (ULTRAM) 50 MG tablet, Take 1 tablet (50 mg total) by mouth every 8 (eight) hours as needed., Disp: 30 tablet, Rfl: 0 .  triamcinolone (KENALOG) 0.025 % cream, TRIAMCINOLONE ACETONIDE, 0.025% (External Cream)  1 (one) Cream Cream apply daily as needed for 0 days  Quantity: 60;  Refills: 0   Ordered :13-Apr-2014  [name] ;  Started 13-Apr-2014 Active Comments: Medication taken as needed. , Disp: , Rfl:   Current  Facility-Administered Medications:  .  betamethasone acetate-betamethasone sodium phosphate (CELESTONE) injection 3 mg, 3 mg, Intramuscular, Once, Edrick Kins, DPM  Review of Systems  Constitutional: Negative.   HENT: Negative.   Respiratory: Negative.   Cardiovascular: Negative.   Genitourinary:       Nocturia 2 times a night at most. Some erectile difficulties to maintain erection throughout intercourse. Still has good desire for his age.    Social History  Substance Use Topics  . Smoking status: Former Smoker    Packs/day: 2.00    Years: 25.00    Quit date: 12/28/1965  . Smokeless tobacco: Never Used  . Alcohol use Yes     Comment: 1 beer occasionally   Objective:   BP (!) 150/90 (BP Location: Right Arm, Patient Position: Sitting, Cuff Size: Normal)   Pulse 60   Temp 97.7 F (36.5 C) (Oral)   Resp 16   Wt 163 lb (73.9 kg)   SpO2 98%   BMI 21.51 kg/m  Vitals:   06/25/17 0838  BP: (!) 150/90  Pulse: 60  Resp: 16  Temp: 97.7 F (36.5 C)  TempSrc: Oral  SpO2: 98%  Weight: 163 lb (73.9 kg)     Physical Exam  Constitutional: He is oriented to person, place, and time. He appears well-developed and well-nourished. No distress.  HENT:  Head: Normocephalic and atraumatic.  Right Ear: Hearing normal.  Left Ear: Hearing normal.  Nose: Nose normal.  Eyes: Conjunctivae and lids are normal. Right eye exhibits no discharge. Left eye exhibits no discharge. No scleral icterus.  Neck: Neck supple.  Cardiovascular: Normal rate and regular rhythm.   Pulmonary/Chest: Effort normal and breath sounds normal. No respiratory distress.  Abdominal: Soft. Bowel sounds are normal.  Musculoskeletal: Normal range of motion.  No tenderness or pain to test ROM today.  Neurological: He is alert and oriented to person, place, and time.  Skin: Skin is intact. No lesion and no rash noted.  Psychiatric: He has a normal mood and affect. His speech is normal and behavior is normal. Thought  content normal.      Assessment & Plan:     1. Left hip pain Intermittent discomfort/pains in the left hip over the past week. No fall or known injury. History of prostate cancer in 2016 but no surgery or recurrence. Will check x-ray for sign of arthritis or metastatic disease. May use Tylenol with Feldene for arthritic pains and TUMS for indigestion or "gas". - DG HIP UNILAT WITH PELVIS 2-3 VIEWS LEFT  2. Chronic kidney disease (CKD), stage III (moderate) Followed by Dr. Holley Raring (nephrologist). Next appointment in July. Recommend he not use any of the Cialis, Levitra or Viagra due to chronic kidney disease. On 05-26-17 Creatinine was 3.08 with EGFR 20.         Vernie Murders, PA  [facility]

## 2017-06-25 NOTE — Telephone Encounter (Signed)
Pt's daughter is calling back about imaging results from earlier today.   Simona Huh order xary on back.  Their call back is [phone]  Thanks teri

## 2017-07-08 ENCOUNTER — Ambulatory Visit (INDEPENDENT_AMBULATORY_CARE_PROVIDER_SITE_OTHER): Payer: Medicare HMO | Admitting: Gastroenterology

## 2017-07-08 ENCOUNTER — Telehealth: Payer: Self-pay | Admitting: Emergency Medicine

## 2017-07-08 ENCOUNTER — Telehealth: Payer: Self-pay

## 2017-07-08 ENCOUNTER — Encounter: Payer: Self-pay | Admitting: Gastroenterology

## 2017-07-08 VITALS — BP 220/110 | HR 74 | Temp 97.6°F | Ht 73.0 in | Wt 159.0 lb

## 2017-07-08 DIAGNOSIS — R195 Other fecal abnormalities: Secondary | ICD-10-CM

## 2017-07-08 DIAGNOSIS — I160 Hypertensive urgency: Secondary | ICD-10-CM | POA: Diagnosis not present

## 2017-07-08 NOTE — Telephone Encounter (Signed)
Call from Dr. Georgeann Oppenheim office about pt BP at their office today. It was 221/120. Pt had not taken his BP medications and was asymptomatic. Per Dr. Caryn Section pt to go home and take his BP medications and have BP checked rather it here, or at home etc around 4:30 to make sure it is going down. Dr. Georgeann Oppenheim office will inform pt of this. Thanks.

## 2017-07-08 NOTE — Telephone Encounter (Signed)
Patient arrived in the office for appt accompanied by son-in-law.  Blood pressure upon arrival was 221/120 Pulse 74.  Rechecked blood pressure 221/108 Pulse remained the same.  Checked manually 221/120 Pulse 74.    Notified Britney at Dr. Sabino Snipes office.  Britney advised Per Dr. Caryn Section the following: Have patient go home take meds, recheck blood pressure to see if it is going down.  If patient does not have a blood pressure monitor have him come to the office at 4:30.  Discussed this option with Dr. Vicente Males- Dr. Vicente Males advised to send patient to ER immediately.  Patient was advised to go to ER.    Contacted Lisa in ER to inform them of patients  BP and they are expecting him.

## 2017-07-08 NOTE — Progress Notes (Signed)
Jonathon Bellows MD, MRCP(U.K) [address]  [place]  [place], [place] [address]  Main: [phone]  Fax: [phone]   Gastroenterology Consultation  Referring Provider:     Margo Common, PA Primary Care Physician:  Margo Common, [place] Primary Gastroenterologist:  Dr. Jonathon Bellows  Reason for Consultation:     Stool occult positive        HPI:   Glenn Bancroft Sr. is a 83 y.o. y/o male referred for consultation & management  by Dr. Natale Milch, Vickki Muff, PA.    He has been referred for a positive stool occult blood test. Labs 04/2017 Hb 10.1 with MCV 86 and platelets 73, iron studies show no clear evidence of iron deficiency. Hb was 12.6 grams in 02/2016 but his renal function has deteriorated since then.   He does have a history of fatigue Last colonoscopy :cant recall .   Occasionally sees some blood in his stool, He also says he eats a lot of watermelons and is unsure if its truly blood. He says he has lost some weight 30 lbs over 7 months. He has a bowel movement daily and has no recent change in bowel movements. No change in shape.       Past Medical History:  Diagnosis Date  . Arthritis   . GERD (gastroesophageal reflux disease)   . Hemorrhoid 1986  . Hyperlipidemia   . Hypertension   . Thyroid disease   . Vertigo     Past Surgical History:  Procedure Laterality Date  . APPENDECTOMY  1968  . BACK SURGERY    . EXCISIONAL HEMORRHOIDECTOMY    . INGUINAL HERNIA REPAIR Right 03/17/2016   Procedure: LAPAROSCOPIC INGUINAL HERNIA;  Surgeon: Florene Glen, MD;  Location: [facility] ORS;  Service: General;  Laterality: Right;  . [place]    Prior to Admission medications   Medication Sig Start Date End Date Taking? Authorizing Provider  amLODipine (NORVASC) 10 MG tablet Take 1 tablet (10 mg total) by mouth 2 (two) times daily. 09/11/15  Yes [name], MD  carisoprodol (SOMA) 350 MG tablet Take 1 tablet (350 mg total) by mouth at bedtime. 09/11/15   Yes [name], MD  COLCRYS 0.6 MG tablet TAKE 1 TABLET BY MOUTH TWICE DAILY. 12/05/16  Yes Chrismon, Vickki Muff, PA  furosemide (LASIX) 20 MG tablet TAKE 1 TABLET BY MOUTH ONCE DAILY FOR EDEMA/BP Patient taking differently: TAKE 1 TABLET BY MOUTH ONCE DAILY FOR EDEMA/BP PRN 12/05/16  Yes Chrismon, Vickki Muff, PA  gabapentin (NEURONTIN) 100 MG capsule Take 1 capsule (100 mg total) by mouth 3 (three) times daily. 04/21/17  Yes Edrick Kins, DPM  levothyroxine (SYNTHROID, LEVOTHROID) 50 MCG tablet TAKE 1 TABLET BY MOUTH ONCE DAILY FOR THYROID, ON AN EMPTY STOMACH. WAIT 30 MINUTES BEFORE TAKING OTHER MEDS. 12/05/16  Yes Chrismon, Vickki Muff, PA  losartan (COZAAR) 100 MG tablet TAKE 1 TABLET BY MOUTH ONCE DAILY FOR BLOOD PRESSURE (HIGH) 12/05/16  Yes Chrismon, Vickki Muff, PA  metoprolol tartrate (LOPRESSOR) 25 MG tablet Take 0.5 tablets (12.5 mg total) by mouth 2 (two) times daily. 12/05/16  Yes Chrismon, Vickki Muff, PA  NONFORMULARY OR COMPOUNDED ITEM See pharmacy note 04/21/17  Yes Edrick Kins, DPM  piroxicam (FELDENE) 20 MG capsule TAKE 1 CAPSULE BY MOUTH ONCE EVERY MORNING WITH FOOD FOR INFLAMMATIONOR PAIN 02/27/17  Yes Chrismon, Vickki Muff, PA  ranitidine (ZANTAC) 150 MG capsule Take 1 capsule (150 mg total) by mouth 2 (  two) times daily. 09/22/16  Yes Chrismon, Vickki Muff, PA  traMADol (ULTRAM) 50 MG tablet Take 1 tablet (50 mg total) by mouth every 8 (eight) hours as needed. 05/12/17  Yes Edrick Kins, DPM  triamcinolone (KENALOG) 0.025 % cream TRIAMCINOLONE ACETONIDE, 0.025% (External Cream)  1 (one) Cream Cream apply daily as needed for 0 days  Quantity: 60;  Refills: 0   Ordered :13-Apr-2014  [name] ;  Started 13-Apr-2014 Active Comments: Medication taken as needed.  04/13/14  Yes [provider]    Family History  Problem Relation Age of Onset  . Alzheimer's disease Father   . Healthy Sister      Social History  Substance Use Topics  . Smoking status: Former Smoker     Packs/day: 2.00    Years: 25.00    Quit date: 12/28/1965  . Smokeless tobacco: Never Used  . Alcohol use Yes     Comment: 1 beer occasionally    Allergies as of 07/08/2017 - Review Complete 07/08/2017  Allergen Reaction Noted  . Codeine  07/05/2015  . Cortizone-10  [hydrocortisone]  07/05/2015    Review of Systems:    All systems reviewed and negative except where noted in HPI.   Physical Exam:  BP (!) 221/120   Pulse 74   Temp 97.6 F (36.4 C) (Oral)   Ht 6\' 1"  (1.854 m)   Wt 159 lb (72.1 kg)   BMI 20.98 kg/m  No LMP for male patient. Psych:  Alert and cooperative. Normal mood and affect. General:   Alert,  Well-developed, well-nourished, pleasant and cooperative in NAD Head:  Normocephalic and atraumatic. Eyes:  Sclera clear, no icterus.   Conjunctiva pink. Ears:  Normal auditory acuity. Nose:  No deformity, discharge, or lesions. Mouth:  No deformity or lesions,oropharynx pink & moist. Neck:  Supple; no masses or thyromegaly. Lungs:  Respirations even and unlabored.  Clear throughout to auscultation.   No wheezes, crackles, or rhonchi. No acute distress. Heart:  Regular rate and rhythm; no murmurs, clicks, rubs, or gallops. Abdomen:  Normal bowel sounds.  No bruits.  Soft, non-tender and non-distended without masses, hepatosplenomegaly or hernias noted.  No guarding or rebound tenderness.    Pulses:  Normal pulses noted. Neurologic:  Alert and oriented x3;  grossly normal neurologically. Skin:  Intact without significant lesions or rashes. No jaundice. Psych:  Alert and cooperative. Normal mood and affect.  Imaging Studies: Dg Hip Unilat With Pelvis 2-3 Views Left  Result Date: 06/25/2017 CLINICAL DATA:  Posterior left hip pain for the past 1-2 weeks with no known injury. History of prostate malignancy as well as arthritis. EXAM: DG HIP (WITH OR WITHOUT PELVIS) 2-3V LEFT COMPARISON:  CT scan of the pelvis dated May 10, 2014 FINDINGS: The bones are subjectively  adequately mineralized. There is no lytic nor blastic lesion. The observed portions of the sacrum are normal. There are seed implants in the prostate bed. There are metallic coils from previous right inguinal hernia repair. AP and lateral views of the left hip reveal mild symmetric narrowing of the joint space. The articular surface of the femoral head and acetabulum remains smoothly rounded. The femoral neck, intertrochanteric, and subtrochanteric regions are normal. IMPRESSION: There is no acute bony abnormality of the pelvis or left hip. There is mild degenerative joint space loss of the left hip which is similar to that seen on the right. There degenerative changes of the lower lumbar spine with evidence of previous surgery. Electronically Signed  By: David  Martinique M.D.   On: 06/25/2017 10:35    Assessment and Plan:   Glenn Malina Sr. is a 83 y.o. y/o male has been referred for positive stool occult test . I explained that the stool test was developed for colorectal cancer . It can turn positive from blood anywhere from the mouth all the way to the anus , including from a digital rectal exam, from heme present in meat , bleeding gums  hence is not indicative of any disorder especially in the setting of a low platelet count. It is very possible that the Hb is lower than prior due to anemia of chronic disease from deterioration of renal function although one cannot rule out a GI cause irrespective of the stool test being positive or negative.  I have discussed alternative options such as CT colonography which he is not interested , risks & benefits,  which include, but are not limited to, bleeding, infection, perforation,respiratory complication & drug reaction he decided to not proceed with EGD/colonoscopy at this moment. He will discuss with his daughter and if he changes his mind will call my office back , We also noticed a very high blood pressure, he admits he is not compliant with his  antihypertensives. Repeat check is 221/108 , I suggest he be seen at the ER for hypertensive urgency right now as he has not been compliant in the past per his own admission  . We will inform his physicians office. I explained to the patient the risk of strokes and heart attacks. He denies any chest pain , headaches.  Follow up as needed  Dr Jonathon Bellows MD,MRCP(U.K)

## 2017-07-09 ENCOUNTER — Encounter: Payer: Self-pay | Admitting: Family Medicine

## 2017-07-09 ENCOUNTER — Ambulatory Visit (INDEPENDENT_AMBULATORY_CARE_PROVIDER_SITE_OTHER): Payer: Medicare HMO | Admitting: Family Medicine

## 2017-07-09 VITALS — BP 192/98 | HR 53 | Temp 97.6°F | Wt 158.2 lb

## 2017-07-09 DIAGNOSIS — N183 Chronic kidney disease, stage 3 unspecified: Secondary | ICD-10-CM

## 2017-07-09 DIAGNOSIS — I10 Essential (primary) hypertension: Secondary | ICD-10-CM

## 2017-07-09 NOTE — Progress Notes (Signed)
Patient: Glenn Harkins Sr. Male    DOB: [birth year]   83 y.o.   MRN: [id number] Visit Date: 07/09/2017  Today's Provider: Vernie Murders, PA   Chief Complaint  Patient presents with  . Hypertension  . Follow-up   Subjective:    HPI  Hypertension, follow-up:  BP Readings from Last 3 Encounters:  07/09/17 (!) 192/98  07/08/17 (!) 220/110  06/25/17 (!) 150/90    He was last seen for hypertension 3 months ago.  BP at that visit was 184/90. Management changes since that visit include continue medication. He reports good compliance with treatment. He is not having side effects.  He is not exercising regularly just yard work He is not adherent to low salt diet.   Outside blood pressures are being checked. He is experiencing none.  Patient denies chest pain, chest pressure/discomfort, irregular heart beat and palpitations.   Cardiovascular risk factors include advanced age (older than 50 for men, 84 for women), dyslipidemia, hypertension and male gender.  Use of agents associated with hypertension: none.     Weight trend: stable Wt Readings from Last 3 Encounters:  07/09/17 158 lb 3.2 oz (71.8 kg)  07/08/17 159 lb (72.1 kg)  06/25/17 163 lb (73.9 kg)    Current diet: eating less due to loss of appetite  ------------------------------------------------------------------------    Previous Medications   AMLODIPINE (NORVASC) 10 MG TABLET    Take 1 tablet (10 mg total) by mouth 2 (two) times daily.   CARISOPRODOL (SOMA) 350 MG TABLET    Take 1 tablet (350 mg total) by mouth at bedtime.   COLCRYS 0.6 MG TABLET    TAKE 1 TABLET BY MOUTH TWICE DAILY.   FUROSEMIDE (LASIX) 20 MG TABLET    TAKE 1 TABLET BY MOUTH ONCE DAILY FOR EDEMA/BP   GABAPENTIN (NEURONTIN) 100 MG CAPSULE    Take 1 capsule (100 mg total) by mouth 3 (three) times daily.   LEVOTHYROXINE (SYNTHROID, LEVOTHROID) 50 MCG TABLET    TAKE 1 TABLET BY MOUTH ONCE DAILY FOR THYROID, ON AN EMPTY STOMACH. WAIT 30 MINUTES  BEFORE TAKING OTHER MEDS.   LOSARTAN (COZAAR) 100 MG TABLET    TAKE 1 TABLET BY MOUTH ONCE DAILY FOR BLOOD PRESSURE (HIGH)   METOPROLOL TARTRATE (LOPRESSOR) 25 MG TABLET    Take 0.5 tablets (12.5 mg total) by mouth 2 (two) times daily.   NONFORMULARY OR COMPOUNDED ITEM    See pharmacy note   PIROXICAM (FELDENE) 20 MG CAPSULE    TAKE 1 CAPSULE BY MOUTH ONCE EVERY MORNING WITH FOOD FOR INFLAMMATIONOR PAIN   RANITIDINE (ZANTAC) 150 MG CAPSULE    Take 1 capsule (150 mg total) by mouth 2 (two) times daily.   TRAMADOL (ULTRAM) 50 MG TABLET    Take 1 tablet (50 mg total) by mouth every 8 (eight) hours as needed.   TRIAMCINOLONE (KENALOG) 0.025 % CREAM    TRIAMCINOLONE ACETONIDE, 0.025% (External Cream)  1 (one) Cream Cream apply daily as needed for 0 days  Quantity: 60;  Refills: 0   Ordered :13-Apr-2014  [name] ;  Started 13-Apr-2014 Active Comments: Medication taken as needed.     Review of Systems  Constitutional: Negative.   Respiratory: Negative.   Cardiovascular: Negative.     Social History  Substance Use Topics  . Smoking status: Former Smoker    Packs/day: 2.00    Years: 25.00    Quit date: 12/28/1965  . Smokeless tobacco: Never Used  . Alcohol use Yes  Comment: 1 beer occasionally   Objective:   BP (!) 192/98 (BP Location: Right Arm, Patient Position: Sitting, Cuff Size: Normal)   Pulse (!) 53   Temp 97.6 F (36.4 C) (Oral)   Wt 158 lb 3.2 oz (71.8 kg)   SpO2 99%   BMI 20.87 kg/m   Physical Exam  Constitutional: He appears well-developed and well-nourished.  HENT:  Head: Normocephalic.  Eyes: Conjunctivae are normal.  Neck: Neck supple.  Cardiovascular: Normal rate and regular rhythm.   Pulmonary/Chest: Effort normal and breath sounds normal.  Abdominal: Soft. Bowel sounds are normal.      Assessment & Plan:     1. Benign hypertension BP very high and he admits not taking Amlodipine, Losartan or Metoprolol regularly. Promises to get back on  meds daily and recheck progress with labs in 6-8 weeks. Still very active doing yard work two days a week. - CBC with Differential/Platelet; Future - Comprehensive metabolic panel; Future  2. Chronic kidney disease (CKD), stage III (moderate) Last creatinine over 3 in May 2018. Feeling well and very active. Followed by Dr. Holley Raring (nephrologist). Recheck labs at next appointment. - CBC with Differential/Platelet; Future - Comprehensive metabolic panel; Future

## 2017-08-25 ENCOUNTER — Other Ambulatory Visit: Payer: Self-pay

## 2017-08-25 DIAGNOSIS — N183 Chronic kidney disease, stage 3 unspecified: Secondary | ICD-10-CM

## 2017-08-25 DIAGNOSIS — I10 Essential (primary) hypertension: Secondary | ICD-10-CM

## 2017-08-26 DIAGNOSIS — N183 Chronic kidney disease, stage 3 (moderate): Secondary | ICD-10-CM | POA: Diagnosis not present

## 2017-08-26 DIAGNOSIS — I10 Essential (primary) hypertension: Secondary | ICD-10-CM | POA: Diagnosis not present

## 2017-08-27 ENCOUNTER — Ambulatory Visit (INDEPENDENT_AMBULATORY_CARE_PROVIDER_SITE_OTHER): Payer: Medicare HMO | Admitting: Family Medicine

## 2017-08-27 ENCOUNTER — Encounter: Payer: Self-pay | Admitting: Family Medicine

## 2017-08-27 VITALS — BP 168/84 | HR 60 | Temp 97.5°F | Wt 163.4 lb

## 2017-08-27 DIAGNOSIS — N183 Chronic kidney disease, stage 3 unspecified: Secondary | ICD-10-CM

## 2017-08-27 DIAGNOSIS — I10 Essential (primary) hypertension: Secondary | ICD-10-CM | POA: Diagnosis not present

## 2017-08-27 DIAGNOSIS — R252 Cramp and spasm: Secondary | ICD-10-CM | POA: Diagnosis not present

## 2017-08-27 LAB — COMPREHENSIVE METABOLIC PANEL
A/G RATIO: 1.3 (ref 1.2–2.2)
ALBUMIN: 4.2 g/dL (ref 3.5–4.7)
ALK PHOS: 106 IU/L (ref 39–117)
ALT: 11 IU/L (ref 0–44)
AST: 18 IU/L (ref 0–40)
BILIRUBIN TOTAL: 0.4 mg/dL (ref 0.0–1.2)
BUN / CREAT RATIO: 16 (ref 10–24)
BUN: 37 mg/dL — ABNORMAL HIGH (ref 8–27)
CHLORIDE: 103 mmol/L (ref 96–106)
CO2: 21 mmol/L (ref 20–29)
Calcium: 9.2 mg/dL (ref 8.6–10.2)
Creatinine, Ser: 2.29 mg/dL — ABNORMAL HIGH (ref 0.76–1.27)
GFR calc non Af Amer: 25 mL/min/{1.73_m2} — ABNORMAL LOW (ref >59)
GFR, EST AFRICAN AMERICAN: 28 mL/min/{1.73_m2} — AB (ref >59)
Globulin, Total: 3.3 g/dL (ref 1.5–4.5)
Glucose: 96 mg/dL (ref 65–99)
POTASSIUM: 5 mmol/L (ref 3.5–5.2)
Sodium: 139 mmol/L (ref 134–144)
TOTAL PROTEIN: 7.5 g/dL (ref 6.0–8.5)

## 2017-08-27 LAB — CBC WITH DIFFERENTIAL/PLATELET
BASOS ABS: 0 10*3/uL (ref 0.0–0.2)
Basos: 0 %
EOS (ABSOLUTE): 0.1 10*3/uL (ref 0.0–0.4)
Eos: 1 %
Hematocrit: 34.3 % — ABNORMAL LOW (ref 37.5–51.0)
Hemoglobin: 10.8 g/dL — ABNORMAL LOW (ref 13.0–17.7)
Immature Grans (Abs): 0 10*3/uL (ref 0.0–0.1)
Immature Granulocytes: 0 %
LYMPHS ABS: 2.4 10*3/uL (ref 0.7–3.1)
Lymphs: 35 %
MCH: 28.6 pg (ref 26.6–33.0)
MCHC: 31.5 g/dL (ref 31.5–35.7)
MCV: 91 fL (ref 79–97)
MONOS ABS: 0.6 10*3/uL (ref 0.1–0.9)
Monocytes: 9 %
NEUTROS ABS: 3.7 10*3/uL (ref 1.4–7.0)
Neutrophils: 55 %
PLATELETS: 140 10*3/uL — AB (ref 150–379)
RBC: 3.78 x10E6/uL — ABNORMAL LOW (ref 4.14–5.80)
RDW: 15.3 % (ref 12.3–15.4)
WBC: 6.8 10*3/uL (ref 3.4–10.8)

## 2017-08-27 MED ORDER — BACLOFEN 5 MG PO TABS: 5.0000 mg | ORAL_TABLET | Freq: Every day | ORAL | 0 refills | Status: DC

## 2017-08-27 NOTE — Progress Notes (Signed)
Patient: Glenn Shular Sr. Male    DOB: 1927/06/12   83 y.o.   MRN: [id number] Visit Date: 08/27/2017  Today's Provider: Vernie Murders, PA   Chief Complaint  Patient presents with  . Hypertension  . Follow-up   Subjective:    HPI  Patient declined Tdap, Prevnar and Influenza vaccines today.   Hypertension, follow-up:  BP Readings from Last 3 Encounters:  08/27/17 (!) 168/84  07/09/17 (!) 192/98  07/08/17 (!) 220/110                He was last seen for hypertension 7 weeks ago.  BP at that visit was 192/98. Management changes since that visit include advised patient to take medication regularly. He reports good compliance with treatment. He is not having side effects.  He is not exercising regularly just yard work He is not adherent to low salt diet.   Outside blood pressures are being checked. He is experiencing none.  Patient denies chest pain, chest pressure/discomfort, irregular heart beat and palpitations.   Cardiovascular risk factors include advanced age (older than 57 for men, 59 for women), dyslipidemia, hypertension and male gender.  Use of agents associated with hypertension: none.                Weight trend: stable    Wt Readings from Last 3 Encounters:  07/09/17 158 lb 3.2 oz (71.8 kg)  07/08/17 159 lb (72.1 kg)  06/25/17 163 lb (73.9 kg)    Current diet: has a little more appetite now Patient Active Problem List   Diagnosis Date Noted  . Edema 10/05/2015  . Allergic rhinitis 07/05/2015  . Arthritis 07/05/2015  . Benign hypertension 07/05/2015  . Chronic kidney disease (CKD), stage III (moderate) 07/05/2015  . Acid reflux 07/05/2015  . Gout 07/05/2015  . HLD (hyperlipidemia) 07/05/2015  . Adult hypothyroidism 07/05/2015  . Cannot sleep 07/05/2015  . Hernia, inguinal 07/05/2015  . LBP (low back pain) 07/05/2015  . Cramp in muscle 07/05/2015  . Leg paresthesia 07/05/2015  . CA of prostate ([place]) 07/05/2015  . Cutaneous eruption  07/05/2015  . Head revolving around 07/05/2015  . Hypothyroidism 07/03/2015  . Right inguinal hernia 05/30/2014   Past Surgical History:  Procedure Laterality Date  . APPENDECTOMY  1968  . BACK SURGERY    . EXCISIONAL HEMORRHOIDECTOMY    . INGUINAL HERNIA REPAIR Right 03/17/2016   Procedure: LAPAROSCOPIC INGUINAL HERNIA;  Surgeon: Florene Glen, MD;  Location: [facility] ORS;  Service: General;  Laterality: Right;  . SPINE SURGERY  1984   Family History  Problem Relation Age of Onset  . Alzheimer's disease Father   . Healthy Sister    Allergies  Allergen Reactions  . Codeine   . Cortizone-10  [Hydrocortisone]      Previous Medications   AMLODIPINE (NORVASC) 10 MG TABLET    Take 1 tablet (10 mg total) by mouth 2 (two) times daily.   CARISOPRODOL (SOMA) 350 MG TABLET    Take 1 tablet (350 mg total) by mouth at bedtime.   COLCRYS 0.6 MG TABLET    TAKE 1 TABLET BY MOUTH TWICE DAILY.   FUROSEMIDE (LASIX) 20 MG TABLET    TAKE 1 TABLET BY MOUTH ONCE DAILY FOR EDEMA/BP   GABAPENTIN (NEURONTIN) 100 MG CAPSULE    Take 1 capsule (100 mg total) by mouth 3 (three) times daily.   LEVOTHYROXINE (SYNTHROID, LEVOTHROID) 50 MCG TABLET    TAKE 1 TABLET BY MOUTH ONCE DAILY FOR  THYROID, ON AN EMPTY STOMACH. WAIT 30 MINUTES BEFORE TAKING OTHER MEDS.   LOSARTAN (COZAAR) 100 MG TABLET    TAKE 1 TABLET BY MOUTH ONCE DAILY FOR BLOOD PRESSURE (HIGH)   METOPROLOL TARTRATE (LOPRESSOR) 25 MG TABLET    Take 0.5 tablets (12.5 mg total) by mouth 2 (two) times daily.   NONFORMULARY OR COMPOUNDED ITEM    See pharmacy note   PIROXICAM (FELDENE) 20 MG CAPSULE    TAKE 1 CAPSULE BY MOUTH ONCE EVERY MORNING WITH FOOD FOR INFLAMMATIONOR PAIN   RANITIDINE (ZANTAC) 150 MG CAPSULE    Take 1 capsule (150 mg total) by mouth 2 (two) times daily.   TRAMADOL (ULTRAM) 50 MG TABLET    Take 1 tablet (50 mg total) by mouth every 8 (eight) hours as needed.   TRIAMCINOLONE (KENALOG) 0.025 % CREAM    TRIAMCINOLONE ACETONIDE, 0.025%  (External Cream)  1 (one) Cream Cream apply daily as needed for 0 days  Quantity: 60;  Refills: 0   Ordered :13-Apr-2014  [name] ;  Started 13-Apr-2014 Active Comments: Medication taken as needed.     Review of Systems  Constitutional: Negative.   HENT: Negative.   Respiratory: Negative.   Cardiovascular: Negative.   Musculoskeletal:       Muscle cramps in feet at night.  Hematological: Negative.     Social History  Substance Use Topics  . Smoking status: Former Smoker    Packs/day: 2.00    Years: 25.00    Quit date: 12/28/1965  . Smokeless tobacco: Never Used  . Alcohol use Yes     Comment: 1 beer occasionally   Objective:   BP (!) 168/84 (BP Location: Right Arm, Patient Position: Sitting, Cuff Size: Normal)   Pulse 60   Temp (!) 97.5 F (36.4 C) (Oral)   Wt 163 lb 6.4 oz (74.1 kg)   SpO2 98%   BMI 21.56 kg/m   Physical Exam  Constitutional: He is oriented to person, place, and time. He appears well-developed and well-nourished.  HENT:  Head: Normocephalic.  Eyes: Conjunctivae are normal.  Neck: Neck supple.  Cardiovascular: Normal rate and regular rhythm.   Pulmonary/Chest: Effort normal and breath sounds normal.  Abdominal: Soft. Bowel sounds are normal.  Musculoskeletal: Normal range of motion.  Lymphadenopathy:    He has no cervical adenopathy.  Neurological: He is alert and oriented to person, place, and time.      Assessment & Plan:     1. Benign hypertension BP improved over the past 3 months on the Metoprolol 25 mg qd, Losartan 100 mg qd and Amlodipine 10 mg BID. CKD followed by Dr. Holley Raring (nephrology). No palpitations, edema, dyspnea or syncopal episodes. Refuses flu shot today. States at his age he does not want anymore medications or vaccinations. Labs on 08-17-17 showed normal electrolytes, creatinine 2.29 with GFR 28. Recheck in 3-4 months.  2. Muscle cramps Has been using Soma 350 mg at bedtime for cramps in feet but notices it  causes a significant drowsiness. Will try switch to Baclofen. - Baclofen 5 MG TABS; Take 5 mg by mouth at bedtime.  Dispense: 30 tablet; Refill: 0  3. Chronic kidney disease (CKD), stage III (moderate) Followed by Dr. Holley Raring (nephrologist). Creatinine dropped from 3.08 to 2.29 in the past 3 months. Feeling well and has stopped taking the Feldene (NSAID). Recheck in 3-4 months.

## 2017-09-15 ENCOUNTER — Other Ambulatory Visit: Payer: Self-pay | Admitting: Podiatry

## 2017-09-16 ENCOUNTER — Other Ambulatory Visit: Payer: Self-pay | Admitting: Family Medicine

## 2017-09-16 DIAGNOSIS — E039 Hypothyroidism, unspecified: Secondary | ICD-10-CM

## 2017-09-16 DIAGNOSIS — I10 Essential (primary) hypertension: Secondary | ICD-10-CM

## 2017-09-16 NOTE — Telephone Encounter (Signed)
[facility] faxed a request for the following medications. Thanks CC  losartan (COZAAR) 100 MG tablet  >Take 1 tablet by mouth once daily high blood pressure  levothyroxine (SYNTHROID, LEVOTHROID) 50 MCG tablet  > take 1 tablet by mouth once daily on empty stomach wait 30 mins before other meds.

## 2017-09-16 NOTE — Telephone Encounter (Signed)
Pt needs an appt prior to future refills. 

## 2017-09-17 MED ORDER — LOSARTAN POTASSIUM 100 MG PO TABS
ORAL_TABLET | ORAL | 3 refills | Status: DC
Start: 2017-09-17 — End: 2018-04-02

## 2017-09-17 MED ORDER — LEVOTHYROXINE SODIUM 50 MCG PO TABS
ORAL_TABLET | ORAL | 3 refills | Status: DC
Start: 2017-09-17 — End: 2018-10-27

## 2017-10-22 ENCOUNTER — Encounter: Payer: Self-pay | Admitting: Family Medicine

## 2017-10-22 ENCOUNTER — Ambulatory Visit (INDEPENDENT_AMBULATORY_CARE_PROVIDER_SITE_OTHER): Payer: Medicare HMO | Admitting: Family Medicine

## 2017-10-22 VITALS — BP 190/90 | HR 59 | Temp 97.8°F | Resp 16 | Wt 163.0 lb

## 2017-10-22 DIAGNOSIS — R252 Cramp and spasm: Secondary | ICD-10-CM

## 2017-10-22 DIAGNOSIS — K644 Residual hemorrhoidal skin tags: Secondary | ICD-10-CM

## 2017-10-22 DIAGNOSIS — I10 Essential (primary) hypertension: Secondary | ICD-10-CM

## 2017-10-22 LAB — COMPLETE METABOLIC PANEL WITH GFR
AG Ratio: 1.1 (calc) (ref 1.0–2.5)
ALBUMIN MSPROF: 3.8 g/dL (ref 3.6–5.1)
ALKALINE PHOSPHATASE (APISO): 77 U/L (ref 40–115)
ALT: 11 U/L (ref 9–46)
AST: 22 U/L (ref 10–35)
BUN / CREAT RATIO: 15 (calc) (ref 6–22)
BUN: 45 mg/dL — AB (ref 7–25)
CHLORIDE: 104 mmol/L (ref 98–110)
CO2: 24 mmol/L (ref 20–32)
CREATININE: 3.09 mg/dL — AB (ref 0.70–1.11)
Calcium: 8.9 mg/dL (ref 8.6–10.3)
GFR, EST AFRICAN AMERICAN: 20 mL/min/{1.73_m2} — AB (ref >=60)
GFR, Est Non African American: 17 mL/min/{1.73_m2} — ABNORMAL LOW (ref >=60)
GLUCOSE: 90 mg/dL (ref 65–99)
Globulin: 3.5 g/dL (calc) (ref 1.9–3.7)
Potassium: 4.3 mmol/L (ref 3.5–5.3)
Sodium: 138 mmol/L (ref 135–146)
TOTAL PROTEIN: 7.3 g/dL (ref 6.1–8.1)
Total Bilirubin: 0.4 mg/dL (ref 0.2–1.2)

## 2017-10-22 LAB — CBC WITH DIFFERENTIAL/PLATELET
Basophils Absolute: 21 cells/uL (ref 0–200)
Basophils Relative: 0.3 %
EOS PCT: 0.9 %
Eosinophils Absolute: 62 cells/uL (ref 15–500)
HCT: 32.3 % — ABNORMAL LOW (ref 38.5–50.0)
Hemoglobin: 10.7 g/dL — ABNORMAL LOW (ref 13.2–17.1)
Lymphs Abs: 1732 cells/uL (ref 850–3900)
MCH: 28.8 pg (ref 27.0–33.0)
MCHC: 33.1 g/dL (ref 32.0–36.0)
MCV: 86.8 fL (ref 80.0–100.0)
MONOS PCT: 9.1 %
MPV: 10.7 fL (ref 7.5–12.5)
NEUTROS ABS: 4457 {cells}/uL (ref 1500–7800)
NEUTROS PCT: 64.6 %
Platelets: 122 10*3/uL — ABNORMAL LOW (ref 140–400)
RBC: 3.72 10*6/uL — ABNORMAL LOW (ref 4.20–5.80)
RDW: 13 % (ref 11.0–15.0)
TOTAL LYMPHOCYTE: 25.1 %
WBC mixed population: 628 cells/uL (ref 200–950)
WBC: 6.9 10*3/uL (ref 3.8–10.8)

## 2017-10-22 NOTE — Progress Notes (Signed)
Patient: Glenn Mcdowell Sr. Male    DOB: 28-Mar-1927   83 y.o.   MRN: [id number] Visit Date: 10/22/2017  Today's Provider: Vernie Murders, PA   Chief Complaint  Patient presents with  . Follow-up   Subjective:    HPI  From 05/26/2017-patient' OC-Lite tested positive for blood in stool. Patient was referred to GI. Patient decided against a colonoscopy. Still has some lightheaded sensation when he bends over or quickly stands from a seated position. States he eats two meals a day. Notices some blood on tissue since yesterday morning which is the first time since May 2018. Has a history of hemorrhoid inflammation and bleeding. Suspects this is just a flare and requests topical treatment.    Past Medical History:  Diagnosis Date  . Arthritis   . GERD (gastroesophageal reflux disease)   . Hemorrhoid 1986  . Hyperlipidemia   . Hypertension   . Thyroid disease   . Vertigo    Past Surgical History:  Procedure Laterality Date  . APPENDECTOMY  1968  . BACK SURGERY    . EXCISIONAL HEMORRHOIDECTOMY    . INGUINAL HERNIA REPAIR Right 03/17/2016   Procedure: LAPAROSCOPIC INGUINAL HERNIA;  Surgeon: Florene Glen, MD;  Location: [facility] ORS;  Service: General;  Laterality: Right;  . SPINE SURGERY  1984   Family History  Problem Relation Age of Onset  . Alzheimer's disease Father   . Healthy Sister    Allergies  Allergen Reactions  . Codeine   . Cortizone-10  [Hydrocortisone]      Current Outpatient Prescriptions:  .  COLCRYS 0.6 MG tablet, TAKE 1 TABLET BY MOUTH TWICE DAILY., Disp: 40 tablet, Rfl: 5 .  furosemide (LASIX) 20 MG tablet, TAKE 1 TABLET BY MOUTH ONCE DAILY FOR EDEMA/BP (Patient taking differently: TAKE 1 TABLET BY MOUTH ONCE DAILY FOR EDEMA/BP PRN), Disp: 90 tablet, Rfl: 3 .  gabapentin (NEURONTIN) 100 MG capsule, TAKE 1 CAPSULE BY MOUTH 3 TIMES DAILY, Disp: 90 capsule, Rfl: 0 .  hydrALAZINE (APRESOLINE) 25 MG tablet, Take 25 mg by mouth 3 (three) times daily.,  Disp: , Rfl:  .  levothyroxine (SYNTHROID, LEVOTHROID) 50 MCG tablet, TAKE 1 TABLET BY MOUTH ONCE DAILY FOR THYROID, ON AN EMPTY STOMACH. WAIT 30 MINUTES BEFORE TAKING OTHER MEDS., Disp: 90 tablet, Rfl: 3 .  metoprolol tartrate (LOPRESSOR) 25 MG tablet, Take 0.5 tablets (12.5 mg total) by mouth 2 (two) times daily., Disp: 90 tablet, Rfl: 3 .  piroxicam (FELDENE) 20 MG capsule, TAKE 1 CAPSULE BY MOUTH ONCE EVERY MORNING WITH FOOD FOR INFLAMMATIONOR PAIN, Disp: 30 capsule, Rfl: 5 .  traMADol (ULTRAM) 50 MG tablet, Take 1 tablet (50 mg total) by mouth every 8 (eight) hours as needed., Disp: 30 tablet, Rfl: 0 .  triamcinolone (KENALOG) 0.025 % cream, TRIAMCINOLONE ACETONIDE, 0.025% (External Cream)  1 (one) Cream Cream apply daily as needed for 0 days  Quantity: 60;  Refills: 0   Ordered :13-Apr-2014  [name] ;  Started 13-Apr-2014 Active Comments: Medication taken as needed. , Disp: , Rfl:  .  amLODipine (NORVASC) 10 MG tablet, Take 1 tablet (10 mg total) by mouth 2 (two) times daily., Disp: 60 tablet, Rfl: 3 .  Baclofen 5 MG TABS, Take 5 mg by mouth at bedtime., Disp: 30 tablet, Rfl: 0 .  losartan (COZAAR) 100 MG tablet, TAKE 1 TABLET BY MOUTH ONCE DAILY FOR BLOOD PRESSURE (HIGH), Disp: 90 tablet, Rfl: 3 .  NONFORMULARY OR COMPOUNDED ITEM,  See pharmacy note, Disp: 120 each, Rfl: 2 .  ranitidine (ZANTAC) 150 MG capsule, Take 1 capsule (150 mg total) by mouth 2 (two) times daily. (Patient not taking: Reported on 10/22/2017), Disp: 60 capsule, Rfl: 3  Current Facility-Administered Medications:  .  betamethasone acetate-betamethasone sodium phosphate (CELESTONE) injection 3 mg, 3 mg, Intramuscular, Once, Edrick Kins, DPM  Review of Systems  Constitutional: Negative for appetite change, chills and fever.  Respiratory: Negative for chest tightness, shortness of breath and wheezing.   Cardiovascular: Negative for chest pain and palpitations.  Gastrointestinal: Positive for blood in stool.  Negative for abdominal pain, nausea and vomiting.    Social History  Substance Use Topics  . Smoking status: Former Smoker    Packs/day: 2.00    Years: 25.00    Quit date: 12/28/1965  . Smokeless tobacco: Never Used  . Alcohol use Yes     Comment: 1 beer occasionally   Objective:   BP (!) 190/90 (BP Location: Right Arm, Patient Position: Sitting, Cuff Size: Normal)   Pulse (!) 59   Temp 97.8 F (36.6 C) (Oral)   Resp 16   Wt 163 lb (73.9 kg)   SpO2 99%   BMI 21.51 kg/m   BP Readings from Last 3 Encounters:  10/22/17 (!) 190/90  08/27/17 (!) 168/84  07/09/17 (!) 192/98    Vitals:   10/22/17 1202  BP: (!) 190/90  Pulse: (!) 59  Resp: 16  Temp: 97.8 F (36.6 C)  TempSrc: Oral  SpO2: 99%  Weight: 163 lb (73.9 kg)     Physical Exam  Constitutional: He is oriented to person, place, and time. He appears well-developed and well-nourished. No distress.  HENT:  Head: Normocephalic and atraumatic.  Right Ear: Hearing normal.  Left Ear: Hearing normal.  Nose: Nose normal.  Eyes: Conjunctivae and lids are normal. Right eye exhibits no discharge. Left eye exhibits no discharge. No scleral icterus.  Neck: Neck supple. No thyromegaly present.  Cardiovascular: Normal rate and regular rhythm.   Pulmonary/Chest: Effort normal and breath sounds normal. No respiratory distress.  Genitourinary: Rectal exam shows guaiac positive stool.  Genitourinary Comments: Large hemorrhoid externally at 1 o'clock of anus.  Musculoskeletal:  Stiff back with well healed scars for lumbar surgery.  Neurological: He is alert and oriented to person, place, and time.  Poor balance. Feel some lightheaded sensation when he turns around or the instant he stands from a seated position.  Skin: Skin is intact. No lesion and no rash noted.  Psychiatric: He has a normal mood and affect. His speech is normal and behavior is normal. Thought content normal.      Assessment & Plan:     1. External  hemorrhoid Recurrence of inflammation/irritation. Declined colonoscopy recommended by GI to evaluate for internal bleeding. May use Hydrocortisone Cream applied BID to external hemorrhoids with occasional hot soaks. Recheck CBC for signs of further blood loss. - CBC with Differential/Platelet  2. Benign hypertension Feeling well and encouraged to continue Amlodipine, Losartan and Metoprolol for BP control. Increase fluid intake and check labs for metabolic disorder or electrolyte imbalance. States he does not want to take additional medications. Known history of stage III CKD followed by Dr. Holley Raring (nephrologist). - CBC with Differential/Platelet - Comprehensive metabolic panel  3. Cramp in muscle Having cramps intermittently but mostly at night. Will refill Baclofen for bedtime and may add Tonic water with Apple Juice 4-6 ounces each evening. Check CMP for signs of electrolyte imbalance. Recheck pending  reports. - Comprehensive metabolic panel          Vernie Murders, PA  [facility]

## 2017-10-22 NOTE — Patient Instructions (Signed)
Hemorrhoids Hemorrhoids are swollen veins in and around the rectum or anus. There are two types of hemorrhoids:  Internal hemorrhoids. These occur in the veins that are just inside the rectum. They may poke through to the outside and become irritated and painful.  External hemorrhoids. These occur in the veins that are outside of the anus and can be felt as a painful swelling or hard lump near the anus.  Most hemorrhoids do not cause serious problems, and they can be managed with home treatments such as diet and lifestyle changes. If home treatments do not help your symptoms, procedures can be done to shrink or remove the hemorrhoids. What are the causes? This condition is caused by increased pressure in the anal area. This pressure may result from various things, including:  Constipation.  Straining to have a bowel movement.  Diarrhea.  Pregnancy.  Obesity.  Sitting for long periods of time.  Heavy lifting or other activity that causes you to strain.  Anal sex.  What are the signs or symptoms? Symptoms of this condition include:  Pain.  Anal itching or irritation.  Rectal bleeding.  Leakage of stool (feces).  Anal swelling.  One or more lumps around the anus.  How is this diagnosed? This condition can often be diagnosed through a visual exam. Other exams or tests may also be done, such as:  Examination of the rectal area with a gloved hand (digital rectal exam).  Examination of the anal canal using a small tube (anoscope).  A blood test, if you have lost a significant amount of blood.  A test to look inside the colon (sigmoidoscopy or colonoscopy).  How is this treated? This condition can usually be treated at home. However, various procedures may be done if dietary changes, lifestyle changes, and other home treatments do not help your symptoms. These procedures can help make the hemorrhoids smaller or remove them completely. Some of these procedures involve  surgery, and others do not. Common procedures include:  Rubber band ligation. Rubber bands are placed at the base of the hemorrhoids to cut off the blood supply to them.  Sclerotherapy. Medicine is injected into the hemorrhoids to shrink them.  Infrared coagulation. A type of light energy is used to get rid of the hemorrhoids.  Hemorrhoidectomy surgery. The hemorrhoids are surgically removed, and the veins that supply them are tied off.  Stapled hemorrhoidopexy surgery. A circular stapling device is used to remove the hemorrhoids and use staples to cut off the blood supply to them.  Follow these instructions at home: Eating and drinking  Eat foods that have a lot of fiber in them, such as whole grains, beans, nuts, fruits, and vegetables. Ask your health care provider about taking products that have added fiber (fiber supplements).  Drink enough fluid to keep your urine clear or pale yellow. Managing pain and swelling  Take warm sitz baths for 20 minutes, 3-4 times a day to ease pain and discomfort.  If directed, apply ice to the affected area. Using ice packs between sitz baths may be helpful. ? Put ice in a plastic bag. ? Place a towel between your skin and the bag. ? Leave the ice on for 20 minutes, 2-3 times a day. General instructions  Take over-the-counter and prescription medicines only as told by your health care provider.  Use medicated creams or suppositories as told.  Exercise regularly.  Go to the bathroom when you have the urge to have a bowel movement. Do not wait.    Avoid straining to have bowel movements.  Keep the anal area dry and clean. Use wet toilet paper or moist towelettes after a bowel movement.  Do not sit on the toilet for long periods of time. This increases blood pooling and pain. Contact a health care provider if:  You have increasing pain and swelling that are not controlled by treatment or medicine.  You have uncontrolled bleeding.  You  have difficulty having a bowel movement, or you are unable to have a bowel movement.  You have pain or inflammation outside the area of the hemorrhoids. This information is not intended to replace advice given to you by your health care provider. Make sure you discuss any questions you have with your health care provider. Document Released: 12/12/2000 Document Revised: 05/14/2016 Document Reviewed: 08/29/2015 Elsevier Interactive Patient Education  2017 [employer]  

## 2017-10-23 ENCOUNTER — Telehealth: Payer: Self-pay

## 2017-10-23 NOTE — Telephone Encounter (Signed)
Pt's daughter Alan Mulder stated she was returning Nikki's call to get pt's lab results. Please advise. Thanks TNP

## 2017-10-23 NOTE — Telephone Encounter (Signed)
-----   Message from Margo Common, [place] sent at 10/23/2017  8:21 AM EDT ----- Anemia essentially stable but kidney function shows worsening Creatinine and should recheck with his nephrologist.

## 2017-10-23 NOTE — Telephone Encounter (Signed)
Patient's daughter [name] advised.

## 2017-10-23 NOTE — Telephone Encounter (Signed)
LMTCB

## 2017-11-03 ENCOUNTER — Other Ambulatory Visit: Payer: Self-pay | Admitting: Family Medicine

## 2017-11-03 ENCOUNTER — Encounter: Payer: Self-pay | Admitting: Family Medicine

## 2017-11-03 ENCOUNTER — Telehealth: Payer: Self-pay | Admitting: Family Medicine

## 2017-11-03 DIAGNOSIS — R252 Cramp and spasm: Secondary | ICD-10-CM

## 2017-11-03 MED ORDER — BACLOFEN 5 MG PO TABS: 5.0000 mg | ORAL_TABLET | Freq: Every day | ORAL | 3 refills | Status: DC

## 2017-11-03 NOTE — Telephone Encounter (Signed)
Pt's daughter [name] stated that pt advised Simona Huh told him that he was going to send in a new medication to [facility] for his leg cramps. Neither Yolanda nor pt know the name of the medication but are requesting something for his leg cramps to be sent to [facility].  Also, [name] stated pt is no longer able to walk down the drive way to get the mail and is requesting a letter to give to post master explaining pt's difficulty getting to mailbox so they can have mailbox moved to being attached to the house. Please advise. Thanks TNP

## 2017-11-03 NOTE — Telephone Encounter (Signed)
Recommend getting 4-6 ounces of Tonic water (has Quinine in it) and Apple juice (equal parts of each) each evening to help cramps. Sent prescription for Baclofen to take at bedtime to try to stop cramps. Letter ready for pick up.

## 2017-11-04 NOTE — Telephone Encounter (Signed)
Patient's daughter [name] advised.

## 2017-11-23 ENCOUNTER — Encounter: Payer: Self-pay | Admitting: *Deleted

## 2017-11-23 ENCOUNTER — Other Ambulatory Visit: Payer: Self-pay

## 2017-11-23 ENCOUNTER — Observation Stay
Admission: EM | Admit: 2017-11-23 | Discharge: 2017-11-27 | Disposition: A | Discharge status: Home / Self Care | Payer: Medicare HMO | Attending: Internal Medicine | Admitting: Internal Medicine

## 2017-11-23 ENCOUNTER — Emergency Department: Payer: Medicare HMO

## 2017-11-23 DIAGNOSIS — K210 Gastro-esophageal reflux disease with esophagitis: Secondary | ICD-10-CM | POA: Diagnosis not present

## 2017-11-23 DIAGNOSIS — R55 Syncope and collapse: Principal | ICD-10-CM | POA: Insufficient documentation

## 2017-11-23 DIAGNOSIS — K5730 Diverticulosis of large intestine without perforation or abscess without bleeding: Secondary | ICD-10-CM | POA: Insufficient documentation

## 2017-11-23 DIAGNOSIS — R42 Dizziness and giddiness: Secondary | ICD-10-CM | POA: Diagnosis not present

## 2017-11-23 DIAGNOSIS — Z87891 Personal history of nicotine dependence: Secondary | ICD-10-CM | POA: Insufficient documentation

## 2017-11-23 DIAGNOSIS — R159 Full incontinence of feces: Secondary | ICD-10-CM | POA: Insufficient documentation

## 2017-11-23 DIAGNOSIS — N183 Chronic kidney disease, stage 3 (moderate): Secondary | ICD-10-CM | POA: Diagnosis not present

## 2017-11-23 DIAGNOSIS — Z23 Encounter for immunization: Secondary | ICD-10-CM | POA: Insufficient documentation

## 2017-11-23 DIAGNOSIS — E785 Hyperlipidemia, unspecified: Secondary | ICD-10-CM | POA: Diagnosis not present

## 2017-11-23 DIAGNOSIS — K922 Gastrointestinal hemorrhage, unspecified: Secondary | ICD-10-CM | POA: Diagnosis not present

## 2017-11-23 DIAGNOSIS — K648 Other hemorrhoids: Secondary | ICD-10-CM | POA: Insufficient documentation

## 2017-11-23 DIAGNOSIS — Z79899 Other long term (current) drug therapy: Secondary | ICD-10-CM | POA: Insufficient documentation

## 2017-11-23 DIAGNOSIS — R4020 Unspecified coma: Secondary | ICD-10-CM | POA: Diagnosis not present

## 2017-11-23 DIAGNOSIS — Z888 Allergy status to other drugs, medicaments and biological substances status: Secondary | ICD-10-CM | POA: Insufficient documentation

## 2017-11-23 DIAGNOSIS — R112 Nausea with vomiting, unspecified: Secondary | ICD-10-CM | POA: Diagnosis not present

## 2017-11-23 DIAGNOSIS — Z885 Allergy status to narcotic agent status: Secondary | ICD-10-CM | POA: Diagnosis not present

## 2017-11-23 DIAGNOSIS — K625 Hemorrhage of anus and rectum: Secondary | ICD-10-CM | POA: Insufficient documentation

## 2017-11-23 DIAGNOSIS — E039 Hypothyroidism, unspecified: Secondary | ICD-10-CM | POA: Insufficient documentation

## 2017-11-23 DIAGNOSIS — K3189 Other diseases of stomach and duodenum: Secondary | ICD-10-CM | POA: Insufficient documentation

## 2017-11-23 DIAGNOSIS — R1111 Vomiting without nausea: Secondary | ICD-10-CM | POA: Diagnosis not present

## 2017-11-23 DIAGNOSIS — I129 Hypertensive chronic kidney disease with stage 1 through stage 4 chronic kidney disease, or unspecified chronic kidney disease: Secondary | ICD-10-CM | POA: Diagnosis not present

## 2017-11-23 DIAGNOSIS — M109 Gout, unspecified: Secondary | ICD-10-CM | POA: Insufficient documentation

## 2017-11-23 DIAGNOSIS — Z8546 Personal history of malignant neoplasm of prostate: Secondary | ICD-10-CM | POA: Insufficient documentation

## 2017-11-23 DIAGNOSIS — D500 Iron deficiency anemia secondary to blood loss (chronic): Secondary | ICD-10-CM | POA: Insufficient documentation

## 2017-11-23 DIAGNOSIS — S299XXA Unspecified injury of thorax, initial encounter: Secondary | ICD-10-CM | POA: Diagnosis not present

## 2017-11-23 DIAGNOSIS — R1110 Vomiting, unspecified: Secondary | ICD-10-CM | POA: Diagnosis not present

## 2017-11-23 LAB — CBC
HEMATOCRIT: 27.5 % — AB (ref 40.0–52.0)
HEMOGLOBIN: 9.1 g/dL — AB (ref 13.0–18.0)
MCH: 28.7 pg (ref 26.0–34.0)
MCHC: 33.2 g/dL (ref 32.0–36.0)
MCV: 86.4 fL (ref 80.0–100.0)
Platelets: 123 10*3/uL — ABNORMAL LOW (ref 150–440)
RBC: 3.18 MIL/uL — AB (ref 4.40–5.90)
RDW: 14.6 % — ABNORMAL HIGH (ref 11.5–14.5)
WBC: 9.5 10*3/uL (ref 3.8–10.6)

## 2017-11-23 LAB — URINALYSIS, COMPLETE (UACMP) WITH MICROSCOPIC
BILIRUBIN URINE: NEGATIVE
Bacteria, UA: NONE SEEN
GLUCOSE, UA: NEGATIVE mg/dL
HGB URINE DIPSTICK: NEGATIVE
Ketones, ur: NEGATIVE mg/dL
Leukocytes, UA: NEGATIVE
NITRITE: NEGATIVE
PROTEIN: 30 mg/dL — AB
SPECIFIC GRAVITY, URINE: 1.008 (ref 1.005–1.030)
Squamous Epithelial / LPF: NONE SEEN
pH: 6 (ref 5.0–8.0)

## 2017-11-23 LAB — COMPREHENSIVE METABOLIC PANEL
ALBUMIN: 3.8 g/dL (ref 3.5–5.0)
ALT: 14 U/L — ABNORMAL LOW (ref 17–63)
ANION GAP: 11 (ref 5–15)
AST: 25 U/L (ref 15–41)
Alkaline Phosphatase: 88 U/L (ref 38–126)
BUN: 49 mg/dL — ABNORMAL HIGH (ref 6–20)
CHLORIDE: 107 mmol/L (ref 101–111)
CO2: 21 mmol/L — AB (ref 22–32)
Calcium: 8.8 mg/dL — ABNORMAL LOW (ref 8.9–10.3)
Creatinine, Ser: 2.54 mg/dL — ABNORMAL HIGH (ref 0.61–1.24)
GFR calc non Af Amer: 21 mL/min — ABNORMAL LOW (ref >60)
GFR, EST AFRICAN AMERICAN: 24 mL/min — AB (ref >60)
GLUCOSE: 116 mg/dL — AB (ref 65–99)
POTASSIUM: 4.9 mmol/L (ref 3.5–5.1)
SODIUM: 139 mmol/L (ref 135–145)
Total Bilirubin: 0.6 mg/dL (ref 0.3–1.2)
Total Protein: 7.5 g/dL (ref 6.5–8.1)

## 2017-11-23 LAB — APTT: aPTT: 29 seconds (ref 24–36)

## 2017-11-23 LAB — PROTIME-INR
INR: 1.09
PROTHROMBIN TIME: 14 s (ref 11.4–15.2)

## 2017-11-23 LAB — TROPONIN I

## 2017-11-23 LAB — CK: Total CK: 216 U/L (ref 49–397)

## 2017-11-23 MED ORDER — SODIUM CHLORIDE 0.9 % IV BOLUS (SEPSIS)
1000.0000 mL | Freq: Once | INTRAVENOUS | Status: AC
  Administered 2017-11-23: 1000 mL via INTRAVENOUS

## 2017-11-23 MED ORDER — MECLIZINE HCL 25 MG PO TABS
25.0000 mg | ORAL_TABLET | Freq: Once | ORAL | Status: AC
  Administered 2017-11-23: 25 mg via ORAL
  Filled 2017-11-23: qty 1

## 2017-11-23 MED ORDER — SODIUM CHLORIDE 0.9 % IV SOLN: 10.0000 mL/h | Freq: Once | INTRAVENOUS | Status: DC

## 2017-11-23 NOTE — ED Triage Notes (Addendum)
Per EMS report, Law enforcement was called for a wellness check by the patient's son and found the patient on the floor by the front door. Patient states he "passed out" several times after going to the bathroom for nausea and vomiting and then trying to get to the front door. Patient lives alone, but does have his daughter checking on him. Patient states he has been voiding more lately and was incontinent of stool upon arrival to the ED. Patient denies pain and doesn't know if he hit his head. Patient states it was around 1700 when he first went to the bathroom and had a syncopal episode. Patient was 96% on RA per EMS, but became dyspneic with exertion and was placed on 2L O2 via [place] while enroute.Patient was alert and oriented x4 upon arrival to the ED. Patient c/o leg cramps in the right leg.

## 2017-11-23 NOTE — ED Notes (Signed)
Consent signed for blood transfusion, see chart

## 2017-11-23 NOTE — ED Provider Notes (Signed)
[facility] Emergency Department Provider Note  ____________________________________________  Time seen: Approximately 9:21 PM  I have reviewed the triage vital signs and the nursing notes.   HISTORY  Chief Complaint Fall    HPI Glenn Jarvis. is a 83 y.o. male with a history of hypertension, hyperlipidemia, vertigo presenting with "swimmy headedness" and multiple syncopal episodes.  The patient reports that around 430 today, he got up to go to the bathroom and then developed a room spinning "swimmy headed" sensation resulting in a syncopal episode where he fell on the floor.  He does not think he injured himself.  He had 2-3 additional episodes as the evening went on.  He denies any blurred or double vision, numbness tingling or weakness, severe headache.  No chest pain, shortness of breath, palpitations.  He did have 2 episodes where he had associated vomiting and fecal incontinence with the syncope.  He has no history of seizures and has not been having any abdominal pain, fevers and chills or dysuria.  Past Medical History:  Diagnosis Date  . Arthritis   . GERD (gastroesophageal reflux disease)   . Hemorrhoid 1986  . Hyperlipidemia   . Hypertension   . Thyroid disease   . Vertigo     Patient Active Problem List   Diagnosis Date Noted  . Edema 10/05/2015  . Allergic rhinitis 07/05/2015  . Arthritis 07/05/2015  . Benign hypertension 07/05/2015  . Chronic kidney disease (CKD), stage III (moderate) ([place]) 07/05/2015  . Acid reflux 07/05/2015  . Gout 07/05/2015  . HLD (hyperlipidemia) 07/05/2015  . Adult hypothyroidism 07/05/2015  . Cannot sleep 07/05/2015  . Hernia, inguinal 07/05/2015  . LBP (low back pain) 07/05/2015  . Cramp in muscle 07/05/2015  . Leg paresthesia 07/05/2015  . CA of prostate ([place]) 07/05/2015  . Cutaneous eruption 07/05/2015  . Head revolving around 07/05/2015  . Hypothyroidism 07/03/2015  . Right inguinal hernia  05/30/2014    Past Surgical History:  Procedure Laterality Date  . APPENDECTOMY  1968  . BACK SURGERY    . EXCISIONAL HEMORRHOIDECTOMY    . INGUINAL HERNIA REPAIR Right 03/17/2016   Procedure: LAPAROSCOPIC INGUINAL HERNIA;  Surgeon: Florene Glen, MD;  Location: [facility] ORS;  Service: General;  Laterality: Right;  . SPINE SURGERY  1984    Current Outpatient Rx  . Order #: [id number] Class: Normal  . Order #: [id number] Class: Normal  . Order #: [id number] Class: Normal  . Order #: [id number] Class: Normal  . Order #: [id number] Class: Historical Med  . Order #: [id number] Class: Normal  . Order #: [id number] Class: Normal  . Order #: [id number] Class: Normal  . Order #: [id number] Class: Print  . Order #: [id number] Class: Normal  . Order #: [id number] Class: Normal  . Order #: [id number] Class: Print  . Order #: [id number] Class: Historical Med    Allergies Codeine and Cortizone-10  [hydrocortisone]  Family History  Problem Relation Age of Onset  . Alzheimer's disease Father   . Healthy Sister     Social History Social History   Tobacco Use  . Smoking status: Former Smoker    Packs/day: 2.00    Years: 25.00    Pack years: 50.00    Last attempt to quit: 12/28/1965    Years since quitting: 51.9  . Smokeless tobacco: Never Used  Substance Use Topics  . Alcohol use: No    Frequency: Never    Comment: 1 beer occasionally  . Drug use: No    Review of Systems Constitutional: No fever/chills.  Positive dizziness.  Positive syncope. Eyes: No visual changes.  No blurred or double vision.  ENT: No sore throat. No congestion or rhinorrhea.  No ear pain. Cardiovascular: Denies chest pain. Denies palpitations. Respiratory: Denies shortness of breath.  No cough. Gastrointestinal: No abdominal pain.  No nausea, positive vomiting.  Positive fecal incontinence.  No constipation.  No black or bloody stools. Genitourinary: Negative for dysuria. Musculoskeletal: Negative for back pain. Skin: Negative  for rash. Neurological: Negative for headaches. No focal numbness, tingling or weakness.  Positive dizziness.    ____________________________________________   PHYSICAL EXAM:  VITAL SIGNS: ED Triage Vitals  Enc Vitals Group     BP 11/23/17 2111 (!) 191/99     Pulse Rate 11/23/17 2111 99     Resp 11/23/17 2111 18     Temp 11/23/17 2111 98.4 F (36.9 C)     Temp Source 11/23/17 2111 Oral     SpO2 11/23/17 2101 96 %     Weight 11/23/17 2109 160 lb (72.6 kg)     Height 11/23/17 2109 6\' 1"  (1.854 m)     Head Circumference --      Peak Flow --      Pain Score --      Pain Loc --      Pain Edu? --      Excl. in [place]? --     Constitutional: Alert and oriented. Well appearing for his age and in no acute distress. Answers questions appropriately. Eyes: Conjunctivae are normal.  EOMI. PERRLA.  No vertical or horizontal nystagmus.  No scleral icterus. Head: Atraumatic.  No battle sign. Nose: No congestion/rhinnorhea.  No swelling of the nose.  No septal hematoma. Mouth/Throat: Mucous membranes are moist.  Neck: No stridor.  Supple.  No JVD.  No meningismus.  No C-spine tenderness to palpation, step-offs or deformities. Cardiovascular: Normal rate, regular rhythm. No murmurs, rubs or gallops.  Respiratory: Normal respiratory effort.  No accessory muscle use or retractions. Lungs CTAB.  No wheezes, rales or ronchi. Gastrointestinal: Soft, nontender and nondistended.  No guarding or rebound.  No peritoneal signs. GU: Supple nonbleeding nonthrombosed external hemorrhoids without any palpable internal hemorrhoids.  The stool is brown mixed with blood.  No pain with rectal examination.  Guaiac positive. Musculoskeletal: No LE edema. No ttp in the calves or palpable cords.  Negative Homan's sign.  Pelvis is stable.  Full range of motion of the bilateral hips, knees and ankles without pain. Neurologic: Alert and oriented 3. Speech is clear.  Face and smile symmetric. Tongue is midline.  EOMI.   PERRLA.  No horizontal or vertical nystagmus.  No pronator drift. 5 out of 5 grip, biceps, triceps, hip flexors, plantar flexion and dorsiflexion. Normal sensation to light touch in the bilateral upper and lower extremities, and face. Normal finger-nose-finger. Skin:  Skin is warm, dry and intact. No rash noted. Psychiatric: Mood and affect are normal. Speech and behavior are normal.  Normal judgement.  ____________________________________________   LABS (all labs ordered are listed, but only abnormal results are displayed)  Labs Reviewed  CBC - Abnormal; Notable for the following components:      Result Value   RBC 3.18 (*)    Hemoglobin 9.1 (*)    HCT 27.5 (*)    RDW 14.6 (*)    Platelets 123 (*)    All other components within normal limits  COMPREHENSIVE METABOLIC PANEL - Abnormal; Notable for the following components:   CO2 21 (*)  Glucose, Bld 116 (*)    BUN 49 (*)    Creatinine, Ser 2.54 (*)    Calcium 8.8 (*)    ALT 14 (*)    GFR calc non Af Amer 21 (*)    GFR calc Af Amer 24 (*)    All other components within normal limits  URINALYSIS, COMPLETE (UACMP) WITH MICROSCOPIC - Abnormal; Notable for the following components:   Color, Urine STRAW (*)    APPearance CLEAR (*)    Protein, ur 30 (*)    All other components within normal limits  CK  TROPONIN I  PROTIME-INR  APTT  TYPE AND SCREEN  PREPARE RBC (CROSSMATCH)   ____________________________________________  EKG  ED ECG REPORT I, Eula Listen, the attending physician, personally viewed and interpreted this ECG.   Date: 11/23/2017  EKG Time: 2129  Rate: 66  Rhythm: normal sinus rhythm  Axis: normal  Intervals:none  ST&T Change: No STEMI  ____________________________________________  RADIOLOGY  Ct Head Wo Contrast  Result Date: 11/23/2017 CLINICAL DATA:  Found on floor, altered level of consciousness EXAM: CT HEAD WITHOUT CONTRAST TECHNIQUE: Contiguous axial images were obtained from the  base of the skull through the vertex without intravenous contrast. COMPARISON:  08/01/2011 FINDINGS: Brain: No acute territorial infarction, hemorrhage or intracranial mass is visualized. Mild atrophy. Hypodensity in the subcortical, periventricular and deep white matter tracts consistent with small vessel ischemic change. Old lacunar infarct left sub insula. Vascular: No hyperdense vessels. Vertebral artery and carotid vessel calcification Skull: No fracture or suspicious lesion Sinuses/Orbits: Mild mucosal thickening in the ethmoid sinuses. No acute orbital abnormality. Other: None IMPRESSION: No CT evidence for acute intracranial abnormality. Atrophy and small vessel ischemic changes of the white matter Electronically Signed   By: Donavan Foil M.D.   On: 11/23/2017 22:31   Dg Chest Portable 1 View  Result Date: 11/23/2017 CLINICAL DATA:  Fall.  Found on floor. EXAM: PORTABLE CHEST 1 VIEW COMPARISON:  09/24/2010 FINDINGS: Normal heart size for technique. Stable aortic tortuosity. Sheet like calcification bilaterally compatible with pleural plaques, progressive. There is no edema, consolidation, effusion, or pneumothorax. No acute osseous finding. IMPRESSION: 1. No acute finding. 2. Calcified pleural plaques. Electronically Signed   By: Monte Fantasia M.D.   On: 11/23/2017 22:08    ____________________________________________   PROCEDURES  Procedure(s) performed: None  Procedures  Critical Care performed: No ____________________________________________   INITIAL IMPRESSION / ASSESSMENT AND PLAN / ED COURSE  Pertinent labs & imaging results that were available during my care of the patient were reviewed by me and considered in my medical decision making (see chart for details).  83 y.o. male with several episodes of dizziness associated with true syncope, 2 episodes of vomiting and fecal incontinence during syncopal episodes.  Overall, the patient is hypertensive with a normal heart rate  and afebrile.  He does not have any focal neurologic deficits on my examination.  We will get a CT scan to evaluate for stroke, and I would consider posterior circulation stroke given his dizziness.  His have a history of vertigo and I would treat him with meclizine, although I do not think this would relate in true syncope.  A cardiac arrhythmia is also possible although I am not seeing one on the monitor when I am examining him or on his EKG.  The patient will be admitted for further evaluation and treatment.  ----------------------------------------- 10:35 PM on 11/23/2017 -----------------------------------------  The patient's laboratory studies show hemoglobin of 9.1 with a hematocrit  of 27.5, which is lower than his baseline.  I have talked in a rectal examination, which does show blood.  He has not noticed any black or bloody stools, but I am concerned for active GI bleed as the cause of the patient's multiple episodes of syncope.  I have ordered 1 unit of blood for transfusion, and will admit the patient at this time for further evaluation and treatment.  ____________________________________________  FINAL CLINICAL IMPRESSION(S) / ED DIAGNOSES  Final diagnoses:  Gastrointestinal hemorrhage, unspecified gastrointestinal hemorrhage type  Syncope, unspecified syncope type  Vomiting without nausea, intractability of vomiting not specified, unspecified vomiting type  Incontinence of feces, unspecified fecal incontinence type         NEW MEDICATIONS STARTED DURING THIS VISIT:  This SmartLink is deprecated. Use AVSMEDLIST instead to display the medication list for a patient.    Eula Listen, MD 11/23/17 2237

## 2017-11-23 NOTE — ED Notes (Signed)
Patient taken to CT scan.

## 2017-11-24 ENCOUNTER — Observation Stay: Payer: Medicare HMO

## 2017-11-24 ENCOUNTER — Observation Stay
Admit: 2017-11-24 | Discharge: 2017-11-24 | Disposition: A | Discharge status: Home / Self Care | Payer: Medicare HMO | Attending: Family Medicine | Admitting: Family Medicine

## 2017-11-24 DIAGNOSIS — K922 Gastrointestinal hemorrhage, unspecified: Secondary | ICD-10-CM | POA: Diagnosis not present

## 2017-11-24 DIAGNOSIS — I10 Essential (primary) hypertension: Secondary | ICD-10-CM | POA: Diagnosis not present

## 2017-11-24 DIAGNOSIS — I6523 Occlusion and stenosis of bilateral carotid arteries: Secondary | ICD-10-CM | POA: Diagnosis not present

## 2017-11-24 DIAGNOSIS — R42 Dizziness and giddiness: Secondary | ICD-10-CM | POA: Diagnosis not present

## 2017-11-24 DIAGNOSIS — D649 Anemia, unspecified: Secondary | ICD-10-CM | POA: Diagnosis not present

## 2017-11-24 DIAGNOSIS — R55 Syncope and collapse: Secondary | ICD-10-CM | POA: Diagnosis present

## 2017-11-24 DIAGNOSIS — R195 Other fecal abnormalities: Secondary | ICD-10-CM | POA: Diagnosis not present

## 2017-11-24 LAB — CBC
HEMATOCRIT: 24.2 % — AB (ref 40.0–52.0)
Hemoglobin: 7.7 g/dL — ABNORMAL LOW (ref 13.0–18.0)
MCH: 27.4 pg (ref 26.0–34.0)
MCHC: 32.1 g/dL (ref 32.0–36.0)
MCV: 85.5 fL (ref 80.0–100.0)
PLATELETS: 117 10*3/uL — AB (ref 150–440)
RBC: 2.83 MIL/uL — ABNORMAL LOW (ref 4.40–5.90)
RDW: 14.3 % (ref 11.5–14.5)
WBC: 5.9 10*3/uL (ref 3.8–10.6)

## 2017-11-24 LAB — IRON AND TIBC
IRON: 21 ug/dL — AB (ref 45–182)
Saturation Ratios: 8 % — ABNORMAL LOW (ref 17.9–39.5)
TIBC: 272 ug/dL (ref 250–450)
UIBC: 251 ug/dL

## 2017-11-24 LAB — CREATININE, SERUM
CREATININE: 2.4 mg/dL — AB (ref 0.61–1.24)
GFR calc Af Amer: 26 mL/min — ABNORMAL LOW (ref >60)
GFR calc non Af Amer: 23 mL/min — ABNORMAL LOW (ref >60)

## 2017-11-24 LAB — TSH: TSH: 1.457 u[IU]/mL (ref 0.350–4.500)

## 2017-11-24 LAB — TROPONIN I
TROPONIN I: 0.04 ng/mL — AB (ref <0.03)
TROPONIN I: 0.04 ng/mL — AB (ref <0.03)
TROPONIN I: 0.05 ng/mL — AB (ref <0.03)

## 2017-11-24 LAB — RETICULOCYTES
RBC.: 2.83 MIL/uL — ABNORMAL LOW (ref 4.40–5.90)
RETIC COUNT ABSOLUTE: 34 10*3/uL (ref 19.0–183.0)
RETIC CT PCT: 1.2 % (ref 0.4–3.1)

## 2017-11-24 LAB — HEMOGLOBIN AND HEMATOCRIT, BLOOD
HCT: 29.5 % — ABNORMAL LOW (ref 40.0–52.0)
HEMOGLOBIN: 10 g/dL — AB (ref 13.0–18.0)

## 2017-11-24 LAB — GLUCOSE, CAPILLARY: Glucose-Capillary: 79 mg/dL (ref 65–99)

## 2017-11-24 LAB — PREPARE RBC (CROSSMATCH)

## 2017-11-24 LAB — VITAMIN B12: Vitamin B-12: 272 pg/mL (ref 180–914)

## 2017-11-24 LAB — FOLATE: FOLATE: 18.7 ng/mL (ref >5.9)

## 2017-11-24 LAB — FERRITIN: FERRITIN: 15 ng/mL — AB (ref 24–336)

## 2017-11-24 MED ORDER — ACETAMINOPHEN 325 MG PO TABS
650.0000 mg | ORAL_TABLET | Freq: Four times a day (QID) | ORAL | Status: DC | PRN
Start: 2017-11-24 — End: 2017-11-27

## 2017-11-24 MED ORDER — INFLUENZA VAC SPLIT HIGH-DOSE 0.5 ML IM SUSY
0.5000 mL | PREFILLED_SYRINGE | INTRAMUSCULAR | Status: AC
  Administered 2017-11-25: 0.5 mL via INTRAMUSCULAR
  Filled 2017-11-24: qty 0.5

## 2017-11-24 MED ORDER — GABAPENTIN 100 MG PO CAPS
100.0000 mg | ORAL_CAPSULE | Freq: Every day | ORAL | Status: DC
  Administered 2017-11-24 – 2017-11-26 (×3): 100 mg via ORAL
  Filled 2017-11-24 (×3): qty 1

## 2017-11-24 MED ORDER — ENSURE ENLIVE PO LIQD: 237.0000 mL | Freq: Three times a day (TID) | ORAL | Status: DC

## 2017-11-24 MED ORDER — HYDRALAZINE HCL 25 MG PO TABS
25.0000 mg | ORAL_TABLET | Freq: Three times a day (TID) | ORAL | Status: DC
  Administered 2017-11-24 – 2017-11-26 (×7): 25 mg via ORAL
  Filled 2017-11-24 (×7): qty 1

## 2017-11-24 MED ORDER — FUROSEMIDE 20 MG PO TABS: 20.0000 mg | ORAL_TABLET | Freq: Every day | ORAL | Status: DC | PRN

## 2017-11-24 MED ORDER — ONDANSETRON HCL 4 MG/2ML IJ SOLN: 4.0000 mg | Freq: Four times a day (QID) | INTRAMUSCULAR | Status: DC | PRN

## 2017-11-24 MED ORDER — SODIUM CHLORIDE 0.9 % IV SOLN
Freq: Once | INTRAVENOUS | Status: AC
  Administered 2017-11-24: 14:00:00 via INTRAVENOUS

## 2017-11-24 MED ORDER — SODIUM CHLORIDE 0.9% FLUSH
3.0000 mL | INTRAVENOUS | Status: DC | PRN
  Administered 2017-11-24: 3 mL via INTRAVENOUS
  Filled 2017-11-24: qty 3

## 2017-11-24 MED ORDER — ACETAMINOPHEN 650 MG RE SUPP: 650.0000 mg | Freq: Four times a day (QID) | RECTAL | Status: DC | PRN

## 2017-11-24 MED ORDER — BACLOFEN 10 MG PO TABS
5.0000 mg | ORAL_TABLET | Freq: Every day | ORAL | Status: DC
  Administered 2017-11-24 (×2): 5 mg via ORAL
  Filled 2017-11-24 (×3): qty 0.5

## 2017-11-24 MED ORDER — DOCUSATE SODIUM 100 MG PO CAPS
100.0000 mg | ORAL_CAPSULE | Freq: Two times a day (BID) | ORAL | Status: DC
  Administered 2017-11-24 – 2017-11-26 (×5): 100 mg via ORAL
  Filled 2017-11-24 (×6): qty 1

## 2017-11-24 MED ORDER — PIROXICAM 20 MG PO CAPS
20.0000 mg | ORAL_CAPSULE | Freq: Every day | ORAL | Status: DC
  Administered 2017-11-24 – 2017-11-26 (×3): 20 mg via ORAL
  Filled 2017-11-24 (×4): qty 1

## 2017-11-24 MED ORDER — LEVOTHYROXINE SODIUM 50 MCG PO TABS
25.0000 ug | ORAL_TABLET | Freq: Every day | ORAL | Status: DC
  Administered 2017-11-24 – 2017-11-25 (×2): 25 ug via ORAL
  Filled 2017-11-24 (×2): qty 1

## 2017-11-24 MED ORDER — LORAZEPAM 2 MG/ML IJ SOLN
1.0000 mg | Freq: Once | INTRAMUSCULAR | Status: AC
  Administered 2017-11-24: 1 mg via INTRAVENOUS
  Filled 2017-11-24: qty 1

## 2017-11-24 MED ORDER — SODIUM CHLORIDE 0.9% FLUSH
3.0000 mL | Freq: Two times a day (BID) | INTRAVENOUS | Status: DC
  Administered 2017-11-24 – 2017-11-26 (×5): 3 mL via INTRAVENOUS

## 2017-11-24 MED ORDER — POLYETHYLENE GLYCOL 3350 17 G PO PACK: 17.0000 g | PACK | Freq: Every day | ORAL | Status: DC | PRN

## 2017-11-24 MED ORDER — HYDROCODONE-ACETAMINOPHEN 5-325 MG PO TABS: 1.0000 | ORAL_TABLET | ORAL | Status: DC | PRN

## 2017-11-24 MED ORDER — METOPROLOL TARTRATE 25 MG PO TABS
12.5000 mg | ORAL_TABLET | Freq: Two times a day (BID) | ORAL | Status: DC
  Administered 2017-11-24 – 2017-11-27 (×7): 12.5 mg via ORAL
  Filled 2017-11-24 (×7): qty 1

## 2017-11-24 MED ORDER — SODIUM CHLORIDE 0.9 % IV SOLN: 250.0000 mL | INTRAVENOUS | Status: DC | PRN

## 2017-11-24 MED ORDER — LOSARTAN POTASSIUM 50 MG PO TABS
100.0000 mg | ORAL_TABLET | Freq: Every day | ORAL | Status: DC
  Administered 2017-11-24 – 2017-11-26 (×3): 100 mg via ORAL
  Filled 2017-11-24 (×3): qty 2

## 2017-11-24 MED ORDER — HYDRALAZINE HCL 20 MG/ML IJ SOLN
10.0000 mg | INTRAMUSCULAR | Status: DC | PRN
  Administered 2017-11-24: 10 mg via INTRAVENOUS
  Filled 2017-11-24: qty 1

## 2017-11-24 MED ORDER — ADULT MULTIVITAMIN W/MINERALS CH
1.0000 | ORAL_TABLET | Freq: Every day | ORAL | Status: DC
  Administered 2017-11-24 – 2017-11-26 (×3): 1 via ORAL
  Filled 2017-11-24 (×3): qty 1

## 2017-11-24 MED ORDER — AMLODIPINE BESYLATE 10 MG PO TABS
10.0000 mg | ORAL_TABLET | Freq: Two times a day (BID) | ORAL | Status: DC
  Administered 2017-11-24 – 2017-11-26 (×5): 10 mg via ORAL
  Filled 2017-11-24 (×5): qty 1

## 2017-11-24 MED ORDER — SODIUM CHLORIDE 0.9% FLUSH
3.0000 mL | Freq: Two times a day (BID) | INTRAVENOUS | Status: DC
  Administered 2017-11-24 – 2017-11-26 (×6): 3 mL via INTRAVENOUS

## 2017-11-24 MED ORDER — HEPARIN SODIUM (PORCINE) 5000 UNIT/ML IJ SOLN
5000.0000 [IU] | Freq: Three times a day (TID) | INTRAMUSCULAR | Status: DC
  Administered 2017-11-24: 5000 [IU] via SUBCUTANEOUS
  Filled 2017-11-24: qty 1

## 2017-11-24 MED ORDER — MECLIZINE HCL 25 MG PO TABS
12.5000 mg | ORAL_TABLET | Freq: Three times a day (TID) | ORAL | Status: DC | PRN
  Administered 2017-11-24: 12.5 mg via ORAL
  Filled 2017-11-24 (×2): qty 0.5

## 2017-11-24 MED ORDER — PANTOPRAZOLE SODIUM 40 MG IV SOLR
40.0000 mg | Freq: Two times a day (BID) | INTRAVENOUS | Status: DC
  Administered 2017-11-24 – 2017-11-26 (×4): 40 mg via INTRAVENOUS
  Filled 2017-11-24 (×5): qty 40

## 2017-11-24 MED ORDER — ONDANSETRON HCL 4 MG PO TABS: 4.0000 mg | ORAL_TABLET | Freq: Four times a day (QID) | ORAL | Status: DC | PRN

## 2017-11-24 MED ORDER — FAMOTIDINE 20 MG PO TABS
20.0000 mg | ORAL_TABLET | Freq: Two times a day (BID) | ORAL | Status: DC
  Administered 2017-11-24 – 2017-11-26 (×5): 20 mg via ORAL
  Filled 2017-11-24 (×5): qty 1

## 2017-11-24 NOTE — Progress Notes (Signed)
Called hospitalist Dr. Marcille Blanco after H&H came back at 10.0 & 29.5 to see if he would D/C second unit of blood. Per Dr. Marcille Blanco the lab values are suspicious with HGB going from 7.7 to 10.0, he stated " I will call the lab to see if they drew blood from same arm transfusion ran in to see if lab was concentrated". Notified night nurse.

## 2017-11-24 NOTE — Progress Notes (Signed)
[facility] NAME: Glenn Jarvis    MR#:  [id number]  DATE OF BIRTH:  1927/09/05  SUBJECTIVE:  CHIEF COMPLAINT:   Chief Complaint  Patient presents with  . Fall    came with Feeling dizzi, had dark stool and Hb dropped. Receiving blood transfusion today.  REVIEW OF SYSTEMS:  CONSTITUTIONAL: No fever,positive for fatigue or weakness.  EYES: No blurred or double vision.  EARS, NOSE, AND THROAT: No tinnitus or ear pain.  RESPIRATORY: No cough, shortness of breath, wheezing or hemoptysis.  CARDIOVASCULAR: No chest pain, orthopnea, edema.  GASTROINTESTINAL: No nausea, vomiting, diarrhea or abdominal pain.  GENITOURINARY: No dysuria, hematuria.  ENDOCRINE: No polyuria, nocturia,  HEMATOLOGY: No anemia, easy bruising or bleeding SKIN: No rash or lesion. MUSCULOSKELETAL: No joint pain or arthritis.   NEUROLOGIC: No tingling, numbness, weakness.  PSYCHIATRY: No anxiety or depression.   ROS  DRUG ALLERGIES:   Allergies  Allergen Reactions  . Codeine   . Cortizone-10  [Hydrocortisone]     VITALS:  Blood pressure (!) 159/69, pulse 61, temperature 98.7 F (37.1 C), temperature source Oral, resp. rate 20, height 6' (1.829 m), weight 72.3 kg (159 lb 6.4 oz), SpO2 99 %.  PHYSICAL EXAMINATION:  GENERAL:  83 y.o.-year-old patient lying in the bed with no acute distress.  EYES: Pupils equal, round, reactive to light and accommodation. No scleral icterus. Extraocular muscles intact.  HEENT: Head atraumatic, normocephalic. Oropharynx and nasopharynx clear.  NECK:  Supple, no jugular venous distention. No thyroid enlargement, no tenderness.  LUNGS: Normal breath sounds bilaterally, no wheezing, rales,rhonchi or crepitation. No use of accessory muscles of respiration.  CARDIOVASCULAR: S1, S2 normal. No murmurs, rubs, or gallops.  ABDOMEN: Soft, nontender, nondistended. Bowel sounds present. No organomegaly or mass.  EXTREMITIES: No pedal  edema, cyanosis, or clubbing.  NEUROLOGIC: Cranial nerves II through XII are intact. Muscle strength 4-5/5 in all extremities. Sensation intact. Gait not checked.   he have Finger nose test intact. Have chronic numbness and some weakness on left foot.  Prefer to keep his eyes closed as he feels dizzi opening it. PSYCHIATRIC: The patient is alert and oriented x 3.  SKIN: No obvious rash, lesion, or ulcer.   Physical Exam LABORATORY PANEL:   CBC Recent Labs  Lab 11/24/17 0710  WBC 5.9  HGB 7.7*  HCT 24.2*  PLT 117*   ------------------------------------------------------------------------------------------------------------------  Chemistries  Recent Labs  Lab 11/23/17 2121 11/24/17 0710  NA 139  --   K 4.9  --   CL 107  --   CO2 21*  --   GLUCOSE 116*  --   BUN 49*  --   CREATININE 2.54* 2.40*  CALCIUM 8.8*  --   AST 25  --   ALT 14*  --   ALKPHOS 88  --   BILITOT 0.6  --    ------------------------------------------------------------------------------------------------------------------  Cardiac Enzymes Recent Labs  Lab 11/24/17 0710 11/24/17 1235  TROPONINI 0.05* 0.04*   ------------------------------------------------------------------------------------------------------------------  RADIOLOGY:  Ct Head Wo Contrast  Result Date: 11/23/2017 CLINICAL DATA:  Found on floor, altered level of consciousness EXAM: CT HEAD WITHOUT CONTRAST TECHNIQUE: Contiguous axial images were obtained from the base of the skull through the vertex without intravenous contrast. COMPARISON:  08/01/2011 FINDINGS: Brain: No acute territorial infarction, hemorrhage or intracranial mass is visualized. Mild atrophy. Hypodensity in the subcortical, periventricular and deep white matter tracts consistent with small vessel ischemic change. Old lacunar infarct left sub  insula. Vascular: No hyperdense vessels. Vertebral artery and carotid vessel calcification Skull: No fracture or suspicious  lesion Sinuses/Orbits: Mild mucosal thickening in the ethmoid sinuses. No acute orbital abnormality. Other: None IMPRESSION: No CT evidence for acute intracranial abnormality. Atrophy and small vessel ischemic changes of the white matter Electronically Signed   By: Donavan Foil M.D.   On: 11/23/2017 22:31   US Carotid Bilateral  Result Date: 11/24/2017 CLINICAL DATA:  Syncope and collapse.  History of hypertension. EXAM: BILATERAL CAROTID DUPLEX ULTRASOUND TECHNIQUE: Pearline Cables scale imaging, color Doppler and duplex ultrasound were performed of bilateral carotid and vertebral arteries in the neck. COMPARISON:  None. FINDINGS: Criteria: Quantification of carotid stenosis is based on velocity parameters that correlate the residual internal carotid diameter with NASCET-based stenosis levels, using the diameter of the distal internal carotid lumen as the denominator for stenosis measurement. The following velocity measurements were obtained: RIGHT ICA:  171/51 cm/sec CCA:  16/1 cm/sec SYSTOLIC ICA/CCA RATIO:  2.9 DIASTOLIC ICA/CCA RATIO:  6.0 ECA:  83 cm/sec LEFT ICA:  119/33 cm/sec CCA:  09/60 cm/sec SYSTOLIC ICA/CCA RATIO:  1.6 DIASTOLIC ICA/CCA RATIO:  2.3 ECA:  77 cm/sec RIGHT CAROTID ARTERY: There is a minimal to moderate amount of mixed echogenic plaque within the right carotid bulb (image 17), extending to involve the origin and proximal aspects of the right internal carotid artery (image 25), resulting elevated peak systolic velocities within the mid and distal aspects of the right internal carotid artery (greatest acquired peak systolic velocity within the mid right ICA measures 171 cm/sec). RIGHT VERTEBRAL ARTERY:  Antegrade Flow LEFT CAROTID ARTERY: There is a moderate amount of echogenic partially shadowing plaque with the distal aspect of the left common carotid artery (image 45), extending to involve the left carotid bulb (image 50), origin and proximal aspects of the left internal carotid artery (image  58), not resulting in elevated peak systolic velocities in the left internal carotid artery to suggest a hemodynamically significant stenosis. LEFT VERTEBRAL ARTERY:  Antegrade flow IMPRESSION: 1. Moderate amount of right-sided atherosclerotic plaque results in elevated peak systolic velocities with the right internal carotid artery compatible with the 50-69% luminal narrowing range. 2. Moderate amount of left-sided atherosclerotic plaque, not definitely resulting in a hemodynamically significant stenosis. Electronically Signed   By: Sandi Mariscal M.D.   On: 11/24/2017 10:15   Dg Chest Portable 1 View  Result Date: 11/23/2017 CLINICAL DATA:  Fall.  Found on floor. EXAM: PORTABLE CHEST 1 VIEW COMPARISON:  09/24/2010 FINDINGS: Normal heart size for technique. Stable aortic tortuosity. Sheet like calcification bilaterally compatible with pleural plaques, progressive. There is no edema, consolidation, effusion, or pneumothorax. No acute osseous finding. IMPRESSION: 1. No acute finding. 2. Calcified pleural plaques. Electronically Signed   By: Monte Fantasia M.D.   On: 11/23/2017 22:08    ASSESSMENT AND PLAN:   Active Problems:   Syncope and collapse  1 acute syncope Most likely secondary to recurrent severe vertigo resulting in collapse  neuro checks per routine,   ruled out acute coronary syndrome with cardiac enzymes x3 stable, fall precautions,  repeat orthostatics in the morning does not show much drop, meclizine as needed vertigo, check echocardiogram, carotid Dopplers- have moderate blockages,  physical therapy to evaluate/treat, and continue close medical monitoring CT head did not show any acute process, chest x-ray negative, urinalysis negative   Will do MRI brain to r/o stroke.  2 acute recurrent severe vertigo Meclizine as needed  3 acute on chronic anemia- GI blood  loss- GI bleed. Most likely secondary to hemorrhoidal disease Receiving transfusion, GI saw- plan for EGD and  colonoscopy tomorrow.  4 chronic benign essential hypertension Currently uncontrolled Continue home regiment-we will give dose of Norvasc now, as needed hydralazine for systolic blood pressure greater than 160, vitals per routine, make changes as per necessary may have to hold some now as NPO.  5 chronic GERD without esophagitis PPI IV BID with bleed now.  6 chronic hypothyroidism Stable Continue Synthroid   All the records are reviewed and case discussed with Care Management/Social Workerr. Management plans discussed with the patient, family and they are in agreement.  CODE STATUS: Full.  TOTAL TIME TAKING CARE OF THIS PATIENT: 35 minutes.     POSSIBLE D/C IN 1-2 DAYS, DEPENDING ON CLINICAL CONDITION.   Vaughan Basta M.D on 11/24/2017   Between 7am to 6pm - Pager - [phone]  After 6pm go to [email or web address] - password [email or web address] [facility]  Office  [phone]  CC: Primary care physician; Margo Common, PA  Note: This dictation was prepared with Dragon dictation along with smaller phrase technology. Any transcriptional errors that result from this process are unintentional.

## 2017-11-24 NOTE — Consult Note (Signed)
GI Inpatient Consult Note Glenn Jarvis, M.D.  Reason for Consult: Rectal bleed, symptomatic anemia   Attending Requesting Consult: Loney Hering, M.D.  Outpatient Primary Physician: Dr. Natale Milch, [facility]  History of Present Illness: Glenn Jarvis. is a 83 y.o. male patient of Dr. Natale Milch who was admitted to Dr. Rolly Pancake hospitalist service. Patient has history of gout but recently saw his family care provider for anemia and was deemed to be Hemoccult-positive. He had seen Dr. Jonathon Bellows from [facility] in July 2018 for hemoccult positive stool without overt anemia and it was decided patient would likely be high risk for elective procedures, so the patient decided to decline the EGD and colonoscopy that was offered.   the patient was admitted to the hospital yesterday after a syncopal episode at home aggravated by a case of vertigo. Patient's hemoglobin is 9.5 and also admission yesterday but is declined to 7.7 today. Blood transfusions had been canceled overnight. Patient does not take any anticoagulants. He has never previously undergone a colonoscopy for colon cancer screening your upper endoscopy. The patient denies seeing any frank blood although the patient is noted to have dark red blood in the rectal vault digital rectal examination emergency room last evening. Once again, he was found to have occult positive blood in the stool summer 2018. Patient has mild acid reflux with retrosternal burning but denies any dysphagia, nausea or vomiting. There's been no overt weight loss.  Past Medical History:  Past Medical History:  Diagnosis Date  . Arthritis   . GERD (gastroesophageal reflux disease)   . Hemorrhoid 1986  . Hyperlipidemia   . Hypertension   . Thyroid disease   . Vertigo     Problem List: Patient Active Problem List   Diagnosis Date Noted  . Syncope and collapse 11/24/2017  . Edema 10/05/2015  . Allergic rhinitis 07/05/2015  . Arthritis  07/05/2015  . Benign hypertension 07/05/2015  . Chronic kidney disease (CKD), stage III (moderate) ([place]) 07/05/2015  . Acid reflux 07/05/2015  . Gout 07/05/2015  . HLD (hyperlipidemia) 07/05/2015  . Adult hypothyroidism 07/05/2015  . Cannot sleep 07/05/2015  . Hernia, inguinal 07/05/2015  . LBP (low back pain) 07/05/2015  . Cramp in muscle 07/05/2015  . Leg paresthesia 07/05/2015  . CA of prostate ([place]) 07/05/2015  . Cutaneous eruption 07/05/2015  . Head revolving around 07/05/2015  . Hypothyroidism 07/03/2015  . Right inguinal hernia 05/30/2014    Past Surgical History: Past Surgical History:  Procedure Laterality Date  . APPENDECTOMY  1968  . BACK SURGERY    . EXCISIONAL HEMORRHOIDECTOMY    . INGUINAL HERNIA REPAIR Right 03/17/2016   Procedure: LAPAROSCOPIC INGUINAL HERNIA;  Surgeon: Florene Glen, MD;  Location: [facility] ORS;  Service: General;  Laterality: Right;  . SPINE SURGERY  1984    Allergies: Allergies  Allergen Reactions  . Codeine   . Cortizone-10  [Hydrocortisone]     Home Medications: Facility-Administered Medications Prior to Admission  Medication Dose Route Frequency Provider Last Rate Last Dose  . betamethasone acetate-betamethasone sodium phosphate (CELESTONE) injection 3 mg  3 mg Intramuscular Once Edrick Kins, DPM       Medications Prior to Admission  Medication Sig Dispense Refill Last Dose  . amLODipine (NORVASC) 10 MG tablet Take 1 tablet (10 mg total) by mouth 2 (two) times daily. 60 tablet 3 Unknown  . Baclofen 5 MG TABS Take 5 mg at bedtime by mouth. 30 tablet 3   . furosemide (  LASIX) 20 MG tablet TAKE 1 TABLET BY MOUTH ONCE DAILY FOR EDEMA/BP (Patient taking differently: TAKE 1 TABLET BY MOUTH ONCE DAILY FOR EDEMA/BP PRN) 90 tablet 3 Taking  . gabapentin (NEURONTIN) 100 MG capsule TAKE 1 CAPSULE BY MOUTH 3 TIMES DAILY 90 capsule 0 Taking  . hydrALAZINE (APRESOLINE) 25 MG tablet Take 25 mg by mouth 3 (three) times daily.   Taking  .  levothyroxine (SYNTHROID, LEVOTHROID) 50 MCG tablet TAKE 1 TABLET BY MOUTH ONCE DAILY FOR THYROID, ON AN EMPTY STOMACH. WAIT 30 MINUTES BEFORE TAKING OTHER MEDS. 90 tablet 3 Taking  . losartan (COZAAR) 100 MG tablet TAKE 1 TABLET BY MOUTH ONCE DAILY FOR BLOOD PRESSURE (HIGH) 90 tablet 3 Unknown  . metoprolol tartrate (LOPRESSOR) 25 MG tablet Take 0.5 tablets (12.5 mg total) by mouth 2 (two) times daily. 90 tablet 3 Taking  . NONFORMULARY OR COMPOUNDED ITEM See pharmacy note 120 each 2 Unknown  . piroxicam (FELDENE) 20 MG capsule TAKE 1 CAPSULE BY MOUTH ONCE EVERY MORNING WITH FOOD FOR INFLAMMATIONOR PAIN 30 capsule 5 Taking  . ranitidine (ZANTAC) 150 MG capsule Take 1 capsule (150 mg total) by mouth 2 (two) times daily. (Patient not taking: Reported on 10/22/2017) 60 capsule 3 Not Taking  . traMADol (ULTRAM) 50 MG tablet Take 1 tablet (50 mg total) by mouth every 8 (eight) hours as needed. 30 tablet 0 Taking  . triamcinolone (KENALOG) 0.025 % cream TRIAMCINOLONE ACETONIDE, 0.025% (External Cream)  1 (one) Cream Cream apply daily as needed for 0 days  Quantity: 60;  Refills: 0   Ordered :13-Apr-2014  [name] ;  Started 13-Apr-2014 Active Comments: Medication taken as needed.    Taking   Home medication reconciliation was completed with the patient.   Scheduled Inpatient Medications:   . amLODipine  10 mg Oral BID  . baclofen  5 mg Oral QHS  . docusate sodium  100 mg Oral BID  . famotidine  20 mg Oral BID  . feeding supplement (ENSURE ENLIVE)  237 mL Oral TID BM  . gabapentin  100 mg Oral QHS  . heparin  5,000 Units Subcutaneous Q8H  . hydrALAZINE  25 mg Oral TID  . [START ON 11/25/2017] Influenza vac split quadrivalent PF  0.5 mL Intramuscular Tomorrow-1000  . levothyroxine  25 mcg Oral QAC breakfast  . losartan  100 mg Oral Daily  . metoprolol tartrate  12.5 mg Oral BID  . multivitamin with minerals  1 tablet Oral Daily  . piroxicam  20 mg Oral Daily  . sodium chloride  flush  3 mL Intravenous Q12H  . sodium chloride flush  3 mL Intravenous Q12H    Continuous Inpatient Infusions:   . sodium chloride    . sodium chloride      PRN Inpatient Medications:  sodium chloride, acetaminophen **OR** acetaminophen, furosemide, hydrALAZINE, HYDROcodone-acetaminophen, meclizine, ondansetron **OR** ondansetron (ZOFRAN) IV, polyethylene glycol, sodium chloride flush  Family History: family history includes Alzheimer's disease in his father; Healthy in his sister.   GI Family History: Negative for colon or any GI tract or hepatobiliary cancer.  Social History:   reports that he quit smoking about 51 years ago. He has a 50.00 pack-year smoking history. he has never used smokeless tobacco. He reports that he does not drink alcohol or use drugs. The patient denies ETOH, tobacco, or drug use.    Review of Systems: Review of Systems - Negative except SOB  Physical Examination: BP 137/70 (BP Location: Left Arm)  Pulse 74   Temp 98.1 F (36.7 C) (Oral)   Resp 16   Ht 6' (1.829 m)   Wt 72.3 kg (159 lb 6.4 oz)   SpO2 100%   BMI 21.62 kg/m  Physical Exam  HEENT: NcAT. PERRLA. NECK: supple  Without adenopathy. Chest: Rel CTA. No wheezes. CV: sl tachy without gallop. Abd: soft, nt, nd. No masses or rebound. BS+ Ext: No edema. Pulses 2+ Skin: No rashes. No jaundice.  Neuro: Nonfocal.  Data: Lab Results  Component Value Date   WBC 5.9 11/24/2017   HGB 7.7 (L) 11/24/2017   HCT 24.2 (L) 11/24/2017   MCV 85.5 11/24/2017   PLT 117 (L) 11/24/2017   Recent Labs  Lab 11/23/17 2121 11/24/17 0710  HGB 9.1* 7.7*   Lab Results  Component Value Date   NA 139 11/23/2017   K 4.9 11/23/2017   CL 107 11/23/2017   CO2 21 (L) 11/23/2017   BUN 49 (H) 11/23/2017   CREATININE 2.54 (H) 11/23/2017   Lab Results  Component Value Date   ALT 14 (L) 11/23/2017   AST 25 11/23/2017   ALKPHOS 88 11/23/2017   BILITOT 0.6 11/23/2017   Recent Labs  Lab 11/23/17 2119   APTT 29  INR 1.09   CBC Latest Ref Rng & Units 11/24/2017 11/23/2017 10/22/2017  WBC 3.8 - 10.6 K/uL 5.9 9.5 6.9  Hemoglobin 13.0 - 18.0 g/dL 7.7(L) 9.1(L) 10.7(L)  Hematocrit 40.0 - 52.0 % 24.2(L) 27.5(L) 32.3(L)  Platelets 150 - 440 K/uL 117(L) 123(L) 122(L)    STUDIES: Ct Head Wo Contrast  Result Date: 11/23/2017 CLINICAL DATA:  Found on floor, altered level of consciousness EXAM: CT HEAD WITHOUT CONTRAST TECHNIQUE: Contiguous axial images were obtained from the base of the skull through the vertex without intravenous contrast. COMPARISON:  08/01/2011 FINDINGS: Brain: No acute territorial infarction, hemorrhage or intracranial mass is visualized. Mild atrophy. Hypodensity in the subcortical, periventricular and deep white matter tracts consistent with small vessel ischemic change. Old lacunar infarct left sub insula. Vascular: No hyperdense vessels. Vertebral artery and carotid vessel calcification Skull: No fracture or suspicious lesion Sinuses/Orbits: Mild mucosal thickening in the ethmoid sinuses. No acute orbital abnormality. Other: None IMPRESSION: No CT evidence for acute intracranial abnormality. Atrophy and small vessel ischemic changes of the white matter Electronically Signed   By: Donavan Foil M.D.   On: 11/23/2017 22:31   US Carotid Bilateral  Result Date: 11/24/2017 CLINICAL DATA:  Syncope and collapse.  History of hypertension. EXAM: BILATERAL CAROTID DUPLEX ULTRASOUND TECHNIQUE: Pearline Cables scale imaging, color Doppler and duplex ultrasound were performed of bilateral carotid and vertebral arteries in the neck. COMPARISON:  None. FINDINGS: Criteria: Quantification of carotid stenosis is based on velocity parameters that correlate the residual internal carotid diameter with NASCET-based stenosis levels, using the diameter of the distal internal carotid lumen as the denominator for stenosis measurement. The following velocity measurements were obtained: RIGHT ICA:  171/51 cm/sec CCA:   32/9 cm/sec SYSTOLIC ICA/CCA RATIO:  2.9 DIASTOLIC ICA/CCA RATIO:  6.0 ECA:  83 cm/sec LEFT ICA:  119/33 cm/sec CCA:  92/42 cm/sec SYSTOLIC ICA/CCA RATIO:  1.6 DIASTOLIC ICA/CCA RATIO:  2.3 ECA:  77 cm/sec RIGHT CAROTID ARTERY: There is a minimal to moderate amount of mixed echogenic plaque within the right carotid bulb (image 17), extending to involve the origin and proximal aspects of the right internal carotid artery (image 25), resulting elevated peak systolic velocities within the mid and distal aspects of the right internal carotid  artery (greatest acquired peak systolic velocity within the mid right ICA measures 171 cm/sec). RIGHT VERTEBRAL ARTERY:  Antegrade Flow LEFT CAROTID ARTERY: There is a moderate amount of echogenic partially shadowing plaque with the distal aspect of the left common carotid artery (image 45), extending to involve the left carotid bulb (image 50), origin and proximal aspects of the left internal carotid artery (image 58), not resulting in elevated peak systolic velocities in the left internal carotid artery to suggest a hemodynamically significant stenosis. LEFT VERTEBRAL ARTERY:  Antegrade flow IMPRESSION: 1. Moderate amount of right-sided atherosclerotic plaque results in elevated peak systolic velocities with the right internal carotid artery compatible with the 50-69% luminal narrowing range. 2. Moderate amount of left-sided atherosclerotic plaque, not definitely resulting in a hemodynamically significant stenosis. Electronically Signed   By: Sandi Mariscal M.D.   On: 11/24/2017 10:15   Dg Chest Portable 1 View  Result Date: 11/23/2017 CLINICAL DATA:  Fall.  Found on floor. EXAM: PORTABLE CHEST 1 VIEW COMPARISON:  09/24/2010 FINDINGS: Normal heart size for technique. Stable aortic tortuosity. Sheet like calcification bilaterally compatible with pleural plaques, progressive. There is no edema, consolidation, effusion, or pneumothorax. No acute osseous finding. IMPRESSION: 1. No  acute finding. 2. Calcified pleural plaques. Electronically Signed   By: Monte Fantasia M.D.   On: 11/23/2017 22:08   @IMAGES @  Assessment: 1. Symptomatic anemia-likely secondary to gastrointestinal blood loss. Differential diagnosis includes upper GI sources such as peptic ulcer disease, angiodysplasia, upper GI malignancy. Also consider lower sources such as colon cancer, diverticular bleeding, ischemic colitis.   2. Advanced age.  Recommendation: 1. Transfuse 2 u prbc to assist with oxygen carrying capacity. 2. Serial H/H. 3. Acid suppression. 4.  I have advised EGD and colonoscopy.The patient understands the nature of the planned procedure, indications, risks, alternatives and potential complications including but not limited to bleeding, infection, perforation, damage to internal organs and possible oversedation/side effects from anesthesia. The patient agrees and gives consent to proceed.  Please refer to procedure notes for findings, recommendations and patient disposition/instructions. Thank you for the consult. Please call with questions or concerns.  Olean Ree, MD  11/24/2017 1:22 PM

## 2017-11-24 NOTE — Care Management Important Message (Deleted)
Important Message  Patient Details  Name: Glenn Scurlock Sr. MRN: [id number] Date of Birth: 06-29-1927   Medicare Important Message Given:  Yes    Beverly Sessions, RN 11/24/2017, 1:04 PM

## 2017-11-24 NOTE — Progress Notes (Signed)
MD notified of elevated troponin of 0.04. No change

## 2017-11-24 NOTE — Evaluation (Signed)
Physical Therapy Evaluation Patient Details Name: Glenn Vancuren Sr. MRN: [id number] DOB: 1927-01-16 Today's Date: 11/24/2017   History of Present Illness  83 y/o male who has been having increased issues with vertigo (no vertigo during PT exam) who had a fall at home.  Clinical Impression  Pt did relatively well with mobility, balance, ambulation and showing functional strength, but had some issues with fatigue/increased heart rate with brief bout of ambulation (~75 ft) with walker.  He states that normally he can do quite a bit more but is not at his baseline.  Pt wanting to go home and feels he may be safe, this may be true but pt would need to tolerate more activity w/o vitals being so volatile before this is the safest option.    Follow Up Recommendations SNF(per progress, pt feels he can go home, will need to do more)    Equipment Recommendations       Recommendations for Other Services       Precautions / Restrictions Precautions Precautions: Fall Restrictions Weight Bearing Restrictions: No      Mobility  Bed Mobility Overal bed mobility: Modified Independent             General bed mobility comments: Pt is able to get himself to EOB w/o direct assist, needed extra time, rail use  Transfers Overall transfer level: Modified independent Equipment used: Rolling walker (2 wheeled)             General transfer comment: Pt able to get to standing w/o direct assist, needed walker to maintain balance  Ambulation/Gait Ambulation/Gait assistance: Min guard;Supervision Ambulation Distance (Feet): 75 Feet Assistive device: Rolling walker (2 wheeled)       General Gait Details: Pt able to walk with slow but consistant cadence with walker reliance.  His HR increased from the 80s to nearly 120s with the effort.  Pt with some fatigue, but overall reports that he does not feel too far from his normal regarding speed or consistency  Stairs            Wheelchair  Mobility    Modified Rankin (Stroke Patients Only)       Balance Overall balance assessment: Modified Independent                                           Pertinent Vitals/Pain Pain Assessment: No/denies pain    Home Living Family/patient expects to be discharged to:: Private residence Living Arrangements: Alone Available Help at Discharge: (daughter checks on him regularly)   Home Access: Stairs to enter("a few")              Prior Function Level of Independence: Independent with assistive device(s)         Comments: Pt reports he uses a cane most of the time     Hand Dominance        Extremity/Trunk Assessment   Upper Extremity Assessment Upper Extremity Assessment: Generalized weakness(age appropriate limitations)    Lower Extremity Assessment Lower Extremity Assessment: Generalized weakness(age appropriate limitations)       Communication   Communication: No difficulties  Cognition Arousal/Alertness: Awake/alert Behavior During Therapy: Impulsive Overall Cognitive Status: Within Functional Limits for tasks assessed  General Comments      Exercises     Assessment/Plan    PT Assessment Patient needs continued PT services  PT Problem List Decreased strength;Decreased range of motion;Decreased activity tolerance;Decreased balance;Decreased mobility;Decreased cognition;Decreased knowledge of use of DME;Decreased safety awareness;Cardiopulmonary status limiting activity       PT Treatment Interventions DME instruction;Gait training;Stair training;Functional mobility training;Therapeutic exercise;Therapeutic activities;Balance training;Patient/family education    PT Goals (Current goals can be found in the Care Plan section)  Acute Rehab PT Goals Patient Stated Goal: go back home PT Goal Formulation: With patient Time For Goal Achievement: 12/08/17 Potential to Achieve  Goals: Fair    Frequency Min 2X/week   Barriers to discharge Decreased caregiver support      Co-evaluation               AM-PAC PT "6 Clicks" Daily Activity  Outcome Measure Difficulty turning over in bed (including adjusting bedclothes, sheets and blankets)?: A Little Difficulty moving from lying on back to sitting on the side of the bed? : A Little Difficulty sitting down on and standing up from a chair with arms (e.g., wheelchair, bedside commode, etc,.)?: A Little Help needed moving to and from a bed to chair (including a wheelchair)?: None Help needed walking in hospital room?: A Little Help needed climbing 3-5 steps with a railing? : A Little 6 Click Score: 19    End of Session Equipment Utilized During Treatment: Gait belt Activity Tolerance: Patient limited by fatigue;Patient tolerated treatment well(HR increase stopped longer bout of ambulation)     PT Visit Diagnosis: Muscle weakness (generalized) (M62.81);Difficulty in walking, not elsewhere classified (R26.2)    Time: 9323-5573 PT Time Calculation (min) (ACUTE ONLY): 28 min   Charges:   PT Evaluation $PT Eval Low Complexity: 1 Low PT Treatments $Gait Training: 8-22 mins   PT G Codes:   PT G-Codes **NOT FOR INPATIENT CLASS** Functional Assessment Tool Used: AM-PAC 6 Clicks Basic Mobility Functional Limitation: Mobility: Walking and moving around Mobility: Walking and Moving Around Current Status (U2025): At least 20 percent but less than 40 percent impaired, limited or restricted Mobility: Walking and Moving Around Goal Status [phone]): At least 1 percent but less than 20 percent impaired, limited or restricted    Kreg Shropshire, DPT 11/24/2017, 2:13 PM

## 2017-11-24 NOTE — H&P (View-Only) (Signed)
GI Inpatient Consult Note [name], M.D.  Reason for Consult: Rectal bleed, symptomatic anemia   Attending Requesting Consult: Loney Hering, M.D.  Outpatient Primary Physician: Dr. Natale Milch, [facility]  History of Present Illness: Glenn Jarvis. is a 83 y.o. male patient of Dr. Natale Milch who was admitted to Dr. Rolly Pancake hospitalist service. Patient has history of gout but recently saw his family care provider for anemia and was deemed to be Hemoccult-positive. He had seen Dr. Jonathon Bellows from [facility] in July 2018 for hemoccult positive stool without overt anemia and it was decided patient would likely be high risk for elective procedures, so the patient decided to decline the EGD and colonoscopy that was offered.   the patient was admitted to the hospital yesterday after a syncopal episode at home aggravated by a case of vertigo. Patient's hemoglobin is 9.5 and also admission yesterday but is declined to 7.7 today. Blood transfusions had been canceled overnight. Patient does not take any anticoagulants. He has never previously undergone a colonoscopy for colon cancer screening your upper endoscopy. The patient denies seeing any frank blood although the patient is noted to have dark red blood in the rectal vault digital rectal examination emergency room last evening. Once again, he was found to have occult positive blood in the stool summer 2018. Patient has mild acid reflux with retrosternal burning but denies any dysphagia, nausea or vomiting. There's been no overt weight loss.  Past Medical History:  Past Medical History:  Diagnosis Date  . Arthritis   . GERD (gastroesophageal reflux disease)   . Hemorrhoid 1986  . Hyperlipidemia   . Hypertension   . Thyroid disease   . Vertigo     Problem List: Patient Active Problem List   Diagnosis Date Noted  . Syncope and collapse 11/24/2017  . Edema 10/05/2015  . Allergic rhinitis 07/05/2015  . Arthritis  07/05/2015  . Benign hypertension 07/05/2015  . Chronic kidney disease (CKD), stage III (moderate) ([place]) 07/05/2015  . Acid reflux 07/05/2015  . Gout 07/05/2015  . HLD (hyperlipidemia) 07/05/2015  . Adult hypothyroidism 07/05/2015  . Cannot sleep 07/05/2015  . Hernia, inguinal 07/05/2015  . LBP (low back pain) 07/05/2015  . Cramp in muscle 07/05/2015  . Leg paresthesia 07/05/2015  . CA of prostate ([place]) 07/05/2015  . Cutaneous eruption 07/05/2015  . Head revolving around 07/05/2015  . Hypothyroidism 07/03/2015  . Right inguinal hernia 05/30/2014    Past Surgical History: Past Surgical History:  Procedure Laterality Date  . APPENDECTOMY  1968  . BACK SURGERY    . EXCISIONAL HEMORRHOIDECTOMY    . INGUINAL HERNIA REPAIR Right 03/17/2016   Procedure: LAPAROSCOPIC INGUINAL HERNIA;  Surgeon: Florene Glen, MD;  Location: [facility] ORS;  Service: General;  Laterality: Right;  . SPINE SURGERY  1984    Allergies: Allergies  Allergen Reactions  . Codeine   . Cortizone-10  [Hydrocortisone]     Home Medications: Facility-Administered Medications Prior to Admission  Medication Dose Route Frequency Provider Last Rate Last Dose  . betamethasone acetate-betamethasone sodium phosphate (CELESTONE) injection 3 mg  3 mg Intramuscular Once Edrick Kins, DPM       Medications Prior to Admission  Medication Sig Dispense Refill Last Dose  . amLODipine (NORVASC) 10 MG tablet Take 1 tablet (10 mg total) by mouth 2 (two) times daily. 60 tablet 3 Unknown  . Baclofen 5 MG TABS Take 5 mg at bedtime by mouth. 30 tablet 3   . furosemide (  LASIX) 20 MG tablet TAKE 1 TABLET BY MOUTH ONCE DAILY FOR EDEMA/BP (Patient taking differently: TAKE 1 TABLET BY MOUTH ONCE DAILY FOR EDEMA/BP PRN) 90 tablet 3 Taking  . gabapentin (NEURONTIN) 100 MG capsule TAKE 1 CAPSULE BY MOUTH 3 TIMES DAILY 90 capsule 0 Taking  . hydrALAZINE (APRESOLINE) 25 MG tablet Take 25 mg by mouth 3 (three) times daily.   Taking  .  levothyroxine (SYNTHROID, LEVOTHROID) 50 MCG tablet TAKE 1 TABLET BY MOUTH ONCE DAILY FOR THYROID, ON AN EMPTY STOMACH. WAIT 30 MINUTES BEFORE TAKING OTHER MEDS. 90 tablet 3 Taking  . losartan (COZAAR) 100 MG tablet TAKE 1 TABLET BY MOUTH ONCE DAILY FOR BLOOD PRESSURE (HIGH) 90 tablet 3 Unknown  . metoprolol tartrate (LOPRESSOR) 25 MG tablet Take 0.5 tablets (12.5 mg total) by mouth 2 (two) times daily. 90 tablet 3 Taking  . NONFORMULARY OR COMPOUNDED ITEM See pharmacy note 120 each 2 Unknown  . piroxicam (FELDENE) 20 MG capsule TAKE 1 CAPSULE BY MOUTH ONCE EVERY MORNING WITH FOOD FOR INFLAMMATIONOR PAIN 30 capsule 5 Taking  . ranitidine (ZANTAC) 150 MG capsule Take 1 capsule (150 mg total) by mouth 2 (two) times daily. (Patient not taking: Reported on 10/22/2017) 60 capsule 3 Not Taking  . traMADol (ULTRAM) 50 MG tablet Take 1 tablet (50 mg total) by mouth every 8 (eight) hours as needed. 30 tablet 0 Taking  . triamcinolone (KENALOG) 0.025 % cream TRIAMCINOLONE ACETONIDE, 0.025% (External Cream)  1 (one) Cream Cream apply daily as needed for 0 days  Quantity: 60;  Refills: 0   Ordered :13-Apr-2014  [name] ;  Started 13-Apr-2014 Active Comments: Medication taken as needed.    Taking   Home medication reconciliation was completed with the patient.   Scheduled Inpatient Medications:   . amLODipine  10 mg Oral BID  . baclofen  5 mg Oral QHS  . docusate sodium  100 mg Oral BID  . famotidine  20 mg Oral BID  . feeding supplement (ENSURE ENLIVE)  237 mL Oral TID BM  . gabapentin  100 mg Oral QHS  . heparin  5,000 Units Subcutaneous Q8H  . hydrALAZINE  25 mg Oral TID  . [START ON 11/25/2017] Influenza vac split quadrivalent PF  0.5 mL Intramuscular Tomorrow-1000  . levothyroxine  25 mcg Oral QAC breakfast  . losartan  100 mg Oral Daily  . metoprolol tartrate  12.5 mg Oral BID  . multivitamin with minerals  1 tablet Oral Daily  . piroxicam  20 mg Oral Daily  . sodium chloride  flush  3 mL Intravenous Q12H  . sodium chloride flush  3 mL Intravenous Q12H    Continuous Inpatient Infusions:   . sodium chloride    . sodium chloride      PRN Inpatient Medications:  sodium chloride, acetaminophen **OR** acetaminophen, furosemide, hydrALAZINE, HYDROcodone-acetaminophen, meclizine, ondansetron **OR** ondansetron (ZOFRAN) IV, polyethylene glycol, sodium chloride flush  Family History: family history includes Alzheimer's disease in his father; Healthy in his sister.   GI Family History: Negative for colon or any GI tract or hepatobiliary cancer.  Social History:   reports that he quit smoking about 51 years ago. He has a 50.00 pack-year smoking history. he has never used smokeless tobacco. He reports that he does not drink alcohol or use drugs. The patient denies ETOH, tobacco, or drug use.    Review of Systems: Review of Systems - Negative except SOB  Physical Examination: BP 137/70 (BP Location: Left Arm)  Pulse 74   Temp 98.1 F (36.7 C) (Oral)   Resp 16   Ht 6' (1.829 m)   Wt 72.3 kg (159 lb 6.4 oz)   SpO2 100%   BMI 21.62 kg/m  Physical Exam  HEENT: NcAT. PERRLA. NECK: supple  Without adenopathy. Chest: Rel CTA. No wheezes. CV: sl tachy without gallop. Abd: soft, nt, nd. No masses or rebound. BS+ Ext: No edema. Pulses 2+ Skin: No rashes. No jaundice.  Neuro: Nonfocal.  Data: Lab Results  Component Value Date   WBC 5.9 11/24/2017   HGB 7.7 (L) 11/24/2017   HCT 24.2 (L) 11/24/2017   MCV 85.5 11/24/2017   PLT 117 (L) 11/24/2017   Recent Labs  Lab 11/23/17 2121 11/24/17 0710  HGB 9.1* 7.7*   Lab Results  Component Value Date   NA 139 11/23/2017   K 4.9 11/23/2017   CL 107 11/23/2017   CO2 21 (L) 11/23/2017   BUN 49 (H) 11/23/2017   CREATININE 2.54 (H) 11/23/2017   Lab Results  Component Value Date   ALT 14 (L) 11/23/2017   AST 25 11/23/2017   ALKPHOS 88 11/23/2017   BILITOT 0.6 11/23/2017   Recent Labs  Lab 11/23/17 2119   APTT 29  INR 1.09   CBC Latest Ref Rng & Units 11/24/2017 11/23/2017 10/22/2017  WBC 3.8 - 10.6 K/uL 5.9 9.5 6.9  Hemoglobin 13.0 - 18.0 g/dL 7.7(L) 9.1(L) 10.7(L)  Hematocrit 40.0 - 52.0 % 24.2(L) 27.5(L) 32.3(L)  Platelets 150 - 440 K/uL 117(L) 123(L) 122(L)    STUDIES: Ct Head Wo Contrast  Result Date: 11/23/2017 CLINICAL DATA:  Found on floor, altered level of consciousness EXAM: CT HEAD WITHOUT CONTRAST TECHNIQUE: Contiguous axial images were obtained from the base of the skull through the vertex without intravenous contrast. COMPARISON:  08/01/2011 FINDINGS: Brain: No acute territorial infarction, hemorrhage or intracranial mass is visualized. Mild atrophy. Hypodensity in the subcortical, periventricular and deep white matter tracts consistent with small vessel ischemic change. Old lacunar infarct left sub insula. Vascular: No hyperdense vessels. Vertebral artery and carotid vessel calcification Skull: No fracture or suspicious lesion Sinuses/Orbits: Mild mucosal thickening in the ethmoid sinuses. No acute orbital abnormality. Other: None IMPRESSION: No CT evidence for acute intracranial abnormality. Atrophy and small vessel ischemic changes of the white matter Electronically Signed   By: Donavan Foil M.D.   On: 11/23/2017 22:31   US Carotid Bilateral  Result Date: 11/24/2017 CLINICAL DATA:  Syncope and collapse.  History of hypertension. EXAM: BILATERAL CAROTID DUPLEX ULTRASOUND TECHNIQUE: Pearline Cables scale imaging, color Doppler and duplex ultrasound were performed of bilateral carotid and vertebral arteries in the neck. COMPARISON:  None. FINDINGS: Criteria: Quantification of carotid stenosis is based on velocity parameters that correlate the residual internal carotid diameter with NASCET-based stenosis levels, using the diameter of the distal internal carotid lumen as the denominator for stenosis measurement. The following velocity measurements were obtained: RIGHT ICA:  171/51 cm/sec CCA:   40/3 cm/sec SYSTOLIC ICA/CCA RATIO:  2.9 DIASTOLIC ICA/CCA RATIO:  6.0 ECA:  83 cm/sec LEFT ICA:  119/33 cm/sec CCA:  47/42 cm/sec SYSTOLIC ICA/CCA RATIO:  1.6 DIASTOLIC ICA/CCA RATIO:  2.3 ECA:  77 cm/sec RIGHT CAROTID ARTERY: There is a minimal to moderate amount of mixed echogenic plaque within the right carotid bulb (image 17), extending to involve the origin and proximal aspects of the right internal carotid artery (image 25), resulting elevated peak systolic velocities within the mid and distal aspects of the right internal carotid  artery (greatest acquired peak systolic velocity within the mid right ICA measures 171 cm/sec). RIGHT VERTEBRAL ARTERY:  Antegrade Flow LEFT CAROTID ARTERY: There is a moderate amount of echogenic partially shadowing plaque with the distal aspect of the left common carotid artery (image 45), extending to involve the left carotid bulb (image 50), origin and proximal aspects of the left internal carotid artery (image 58), not resulting in elevated peak systolic velocities in the left internal carotid artery to suggest a hemodynamically significant stenosis. LEFT VERTEBRAL ARTERY:  Antegrade flow IMPRESSION: 1. Moderate amount of right-sided atherosclerotic plaque results in elevated peak systolic velocities with the right internal carotid artery compatible with the 50-69% luminal narrowing range. 2. Moderate amount of left-sided atherosclerotic plaque, not definitely resulting in a hemodynamically significant stenosis. Electronically Signed   By: Sandi Mariscal M.D.   On: 11/24/2017 10:15   Dg Chest Portable 1 View  Result Date: 11/23/2017 CLINICAL DATA:  Fall.  Found on floor. EXAM: PORTABLE CHEST 1 VIEW COMPARISON:  09/24/2010 FINDINGS: Normal heart size for technique. Stable aortic tortuosity. Sheet like calcification bilaterally compatible with pleural plaques, progressive. There is no edema, consolidation, effusion, or pneumothorax. No acute osseous finding. IMPRESSION: 1. No  acute finding. 2. Calcified pleural plaques. Electronically Signed   By: Monte Fantasia M.D.   On: 11/23/2017 22:08   @IMAGES @  Assessment: 1. Symptomatic anemia-likely secondary to gastrointestinal blood loss. Differential diagnosis includes upper GI sources such as peptic ulcer disease, angiodysplasia, upper GI malignancy. Also consider lower sources such as colon cancer, diverticular bleeding, ischemic colitis.   2. Advanced age.  Recommendation: 1. Transfuse 2 u prbc to assist with oxygen carrying capacity. 2. Serial H/H. 3. Acid suppression. 4.  I have advised EGD and colonoscopy.The patient understands the nature of the planned procedure, indications, risks, alternatives and potential complications including but not limited to bleeding, infection, perforation, damage to internal organs and possible oversedation/side effects from anesthesia. The patient agrees and gives consent to proceed.  Please refer to procedure notes for findings, recommendations and patient disposition/instructions. Thank you for the consult. Please call with questions or concerns.  Olean Ree, MD  11/24/2017 1:22 PM

## 2017-11-24 NOTE — Progress Notes (Signed)
Initial Nutrition Assessment  DOCUMENTATION CODES:   Non-severe (moderate) malnutrition in context of chronic illness  INTERVENTION:   Ensure Enlive po TID, each supplement provides 350 kcal and 20 grams of protein  Magic cup TID with meals, each supplement provides 290 kcal and 9 grams of protein  MVI daily   NUTRITION DIAGNOSIS:   Moderate Malnutrition related to poor appetite(advanced ) as evidenced by moderate fat depletion, moderate muscle depletion, severe muscle depletion.  GOAL:   Patient will meet greater than or equal to 90% of their needs  MONITOR:   PO intake, Supplement acceptance, Labs, Weight trends, Skin, I & O's  REASON FOR ASSESSMENT:   Malnutrition Screening Tool    ASSESSMENT:   83 y.o. male with a history of hypertension, hyperlipidemia, vertigo presenting with "swimmy headedness" and multiple syncopal episodes.  Pt presents for fall    Met with pt in room today. Pt is a poor historian and unable to provide much history. Pt reports that his appetite has been poor for many years now. Pt reports a 25lbs weight loss over the past year but per chart pt is weight stable. Pt did not eat lunch today; reports "I just want to drink this coffee". Pt does drink Ensure at home but not regularly as he reports it makes him constipated.  RD will order supplements and liberalize diet to encourage increased oral intake.   Medications reviewed and include: colace, pepcid, heparin, synthroid, MVI, meclizine  Labs reviewed: BUN 49(H), creat 2.54(H), Ca 8.8(L) B12- 272 Hgb 7.7(L), Hct 24.2(L)  Nutrition-Focused physical exam completed. Findings are mild fat depletions in orbital and buccal regions, moderate fat depletions in arms and chect, moderate muscle depletions in temporal regions, clavicles, shoulders, and severe muscle depletions in hands and BLE, and no edema.   Diet Order:  Diet regular Room service appropriate? Yes; Fluid consistency: Thin  EDUCATION NEEDS:    Not appropriate for education at this time  Skin: Reviewed RN Assessment  Last BM:  11/26  Height:   Ht Readings from Last 1 Encounters:  11/24/17 6' (1.829 m)    Weight:   Wt Readings from Last 1 Encounters:  11/24/17 159 lb 6.4 oz (72.3 kg)    Ideal Body Weight:  80.9 kg  BMI:  Body mass index is 21.62 kg/m.  Estimated Nutritional Needs:   Kcal:  1900-2200kcal/day   Protein:  108-123g/day   Fluid:  >1.9L/day   Koleen Distance MS, RD, LDN Pager #[phone] After Hours Pager: [phone]

## 2017-11-24 NOTE — H&P (Signed)
[facility] NAME: Glenn Jarvis    MR#:  [id number]  DATE OF BIRTH:  Sep 09, 1927  DATE OF ADMISSION:  11/23/2017  PRIMARY CARE PHYSICIAN: Chrismon, Vickki Muff, PA   REQUESTING/REFERRING PHYSICIAN:   CHIEF COMPLAINT:   Chief Complaint  Patient presents with  . Fall    HISTORY OF PRESENT ILLNESS: Glenn Jarvis  is a 83 y.o. male with a known history per below presents with acute fall around 5:00 on yesterday, found on the floor by family, patient stated that he was up walking to the bathroom when he fell, lost consciousness after episode of severe recurrent vertigo, resulted in nausea, vomiting, fecal incontinence, in the emergency room CT head/urinalysis/chest x-ray were unimpressive, creatinine 2.5 which is at baseline, noted blood pressure elevation of 191/99 pulse of 99, hemoglobin 9.1 down from 10.7, noted dark blood on rectal exam by ED attending, patient denied any history of lower GI bleeding, per family patient does have history of internal hemorrhoids, was recently evaluated by his primary care provider and was deemed to have anemia secondary to hemorrhoidal disease colonoscopy done a very long time ago per family was a normal study, patient evaluated in the emergency room, denies any pain, continues to complain of mild vertigo made worse with any certain movement, patient is now being admitted with acute syncope secondary to recurrent vertigo resulting fall, acute on chronic anemia, and uncontrolled hypertension.  PAST MEDICAL HISTORY:   Past Medical History:  Diagnosis Date  . Arthritis   . GERD (gastroesophageal reflux disease)   . Hemorrhoid 1986  . Hyperlipidemia   . Hypertension   . Thyroid disease   . Vertigo     PAST SURGICAL HISTORY:  Past Surgical History:  Procedure Laterality Date  . APPENDECTOMY  1968  . BACK SURGERY    . EXCISIONAL HEMORRHOIDECTOMY    . INGUINAL HERNIA REPAIR Right 03/17/2016   Procedure:  LAPAROSCOPIC INGUINAL HERNIA;  Surgeon: Florene Glen, MD;  Location: [facility] ORS;  Service: General;  Laterality: Right;  . SPINE SURGERY  1984    SOCIAL HISTORY:  Social History   Tobacco Use  . Smoking status: Former Smoker    Packs/day: 2.00    Years: 25.00    Pack years: 50.00    Last attempt to quit: 12/28/1965    Years since quitting: 51.9  . Smokeless tobacco: Never Used  Substance Use Topics  . Alcohol use: No    Frequency: Never    Comment: 1 beer occasionally    FAMILY HISTORY:  Family History  Problem Relation Age of Onset  . Alzheimer's disease Father   . Healthy Sister     DRUG ALLERGIES:  Allergies  Allergen Reactions  . Codeine   . Cortizone-10  [Hydrocortisone]     REVIEW OF SYSTEMS:   CONSTITUTIONAL: No fever, fatigue or weakness.  EYES: No blurred or double vision.  EARS, NOSE, AND THROAT: No tinnitus or ear pain.  RESPIRATORY: No cough, shortness of breath, wheezing or hemoptysis.  CARDIOVASCULAR: No chest pain, orthopnea, edema.  GASTROINTESTINAL: No nausea, vomiting, diarrhea or abdominal pain.  GENITOURINARY: No dysuria, hematuria.  ENDOCRINE: No polyuria, nocturia,  HEMATOLOGY: No anemia, easy bruising or bleeding SKIN: No rash or lesion. MUSCULOSKELETAL: No joint pain or arthritis.   NEUROLOGIC: No tingling, numbness, weakness.  Vertigo, transient loss of consciousness PSYCHIATRY: No anxiety or depression.   MEDICATIONS AT HOME:  Prior to Admission medications   Medication  Sig Start Date End Date Taking? Authorizing Provider  amLODipine (NORVASC) 10 MG tablet Take 1 tablet (10 mg total) by mouth 2 (two) times daily. 09/11/15   [name], MD  Baclofen 5 MG TABS Take 5 mg at bedtime by mouth. 11/03/17   Chrismon, Vickki Muff, PA  furosemide (LASIX) 20 MG tablet TAKE 1 TABLET BY MOUTH ONCE DAILY FOR EDEMA/BP Patient taking differently: TAKE 1 TABLET BY MOUTH ONCE DAILY FOR EDEMA/BP PRN 12/05/16   Chrismon, Vickki Muff, PA  gabapentin  (NEURONTIN) 100 MG capsule TAKE 1 CAPSULE BY MOUTH 3 TIMES DAILY 09/16/17   Edrick Kins, DPM  hydrALAZINE (APRESOLINE) 25 MG tablet Take 25 mg by mouth 3 (three) times daily.    [provider]  levothyroxine (SYNTHROID, LEVOTHROID) 50 MCG tablet TAKE 1 TABLET BY MOUTH ONCE DAILY FOR THYROID, ON AN EMPTY STOMACH. WAIT 30 MINUTES BEFORE TAKING OTHER MEDS. 09/17/17   Chrismon, Vickki Muff, PA  losartan (COZAAR) 100 MG tablet TAKE 1 TABLET BY MOUTH ONCE DAILY FOR BLOOD PRESSURE (HIGH) 09/17/17   Chrismon, Vickki Muff, PA  metoprolol tartrate (LOPRESSOR) 25 MG tablet Take 0.5 tablets (12.5 mg total) by mouth 2 (two) times daily. 12/05/16   Chrismon, Vickki Muff, PA  NONFORMULARY OR COMPOUNDED ITEM See pharmacy note 04/21/17   Edrick Kins, DPM  piroxicam (FELDENE) 20 MG capsule TAKE 1 CAPSULE BY MOUTH ONCE EVERY MORNING WITH FOOD FOR INFLAMMATIONOR PAIN 02/27/17   Chrismon, Vickki Muff, PA  ranitidine (ZANTAC) 150 MG capsule Take 1 capsule (150 mg total) by mouth 2 (two) times daily. Patient not taking: Reported on 10/22/2017 09/22/16   Chrismon, Vickki Muff, PA  traMADol (ULTRAM) 50 MG tablet Take 1 tablet (50 mg total) by mouth every 8 (eight) hours as needed. 05/12/17   Edrick Kins, DPM  triamcinolone (KENALOG) 0.025 % cream TRIAMCINOLONE ACETONIDE, 0.025% (External Cream)  1 (one) Cream Cream apply daily as needed for 0 days  Quantity: 60;  Refills: 0   Ordered :13-Apr-2014  [name] ;  Started 13-Apr-2014 Active Comments: Medication taken as needed.  04/13/14   [provider]      PHYSICAL EXAMINATION:   VITAL SIGNS: Blood pressure (!) 191/99, pulse 99, temperature 98.4 F (36.9 C), temperature source Oral, resp. rate 18, height 6\' 1"  (1.854 m), weight 72.6 kg (160 lb), SpO2 100 %.  GENERAL:  83 y.o.-year-old patient lying in the bed with no acute distress.  Frail-appearing EYES: Pupils equal, round, reactive to light and accommodation. No scleral icterus. Extraocular muscles  intact.  HEENT: Head atraumatic, normocephalic. Oropharynx and nasopharynx clear.  NECK:  Supple, no jugular venous distention. No thyroid enlargement, no tenderness.  LUNGS: Normal breath sounds bilaterally, no wheezing, rales,rhonchi or crepitation. No use of accessory muscles of respiration.  CARDIOVASCULAR: S1, S2 normal. No murmurs, rubs, or gallops.  ABDOMEN: Soft, nontender, nondistended. Bowel sounds present. No organomegaly or mass.  EXTREMITIES: No pedal edema, cyanosis, or clubbing.  NEUROLOGIC: Cranial nerves II through XII are intact. MAES. Gait not checked.  PSYCHIATRIC: The patient is alert and oriented x 3.  SKIN: No obvious rash, lesion, or ulcer.   LABORATORY PANEL:   CBC Recent Labs  Lab 11/23/17 2121  WBC 9.5  HGB 9.1*  HCT 27.5*  PLT 123*  MCV 86.4  MCH 28.7  MCHC 33.2  RDW 14.6*   ------------------------------------------------------------------------------------------------------------------  Chemistries  Recent Labs  Lab 11/23/17 2121  NA 139  K 4.9  CL 107  CO2 21*  GLUCOSE 116*  BUN 49*  CREATININE 2.54*  CALCIUM 8.8*  AST 25  ALT 14*  ALKPHOS 88  BILITOT 0.6   ------------------------------------------------------------------------------------------------------------------ estimated creatinine clearance is 20.6 mL/min (A) (by C-G formula based on SCr of 2.54 mg/dL (H)). ------------------------------------------------------------------------------------------------------------------ No results for input(s): TSH, T4TOTAL, T3FREE, THYROIDAB in the last 72 hours.  Invalid input(s): FREET3   Coagulation profile Recent Labs  Lab 11/23/17 2119  INR 1.09   ------------------------------------------------------------------------------------------------------------------- No results for input(s): DDIMER in the last 72  hours. -------------------------------------------------------------------------------------------------------------------  Cardiac Enzymes Recent Labs  Lab 11/23/17 2121  TROPONINI <0.03   ------------------------------------------------------------------------------------------------------------------ Invalid input(s): POCBNP  ---------------------------------------------------------------------------------------------------------------  Urinalysis    Component Value Date/Time   COLORURINE STRAW (A) 11/23/2017 2121   APPEARANCEUR CLEAR (A) 11/23/2017 2121   LABSPEC 1.008 11/23/2017 2121   PHURINE 6.0 11/23/2017 2121   GLUCOSEU NEGATIVE 11/23/2017 2121   HGBUR NEGATIVE 11/23/2017 2121   [place] NEGATIVE 11/23/2017 2121   KETONESUR NEGATIVE 11/23/2017 2121   PROTEINUR 30 (A) 11/23/2017 2121   NITRITE NEGATIVE 11/23/2017 2121   LEUKOCYTESUR NEGATIVE 11/23/2017 2121     RADIOLOGY: Ct Head Wo Contrast  Result Date: 11/23/2017 CLINICAL DATA:  Found on floor, altered level of consciousness EXAM: CT HEAD WITHOUT CONTRAST TECHNIQUE: Contiguous axial images were obtained from the base of the skull through the vertex without intravenous contrast. COMPARISON:  08/01/2011 FINDINGS: Brain: No acute territorial infarction, hemorrhage or intracranial mass is visualized. Mild atrophy. Hypodensity in the subcortical, periventricular and deep white matter tracts consistent with small vessel ischemic change. Old lacunar infarct left sub insula. Vascular: No hyperdense vessels. Vertebral artery and carotid vessel calcification Skull: No fracture or suspicious lesion Sinuses/Orbits: Mild mucosal thickening in the ethmoid sinuses. No acute orbital abnormality. Other: None IMPRESSION: No CT evidence for acute intracranial abnormality. Atrophy and small vessel ischemic changes of the white matter Electronically Signed   By: Donavan Foil M.D.   On: 11/23/2017 22:31   Dg Chest Portable 1  View  Result Date: 11/23/2017 CLINICAL DATA:  Fall.  Found on floor. EXAM: PORTABLE CHEST 1 VIEW COMPARISON:  09/24/2010 FINDINGS: Normal heart size for technique. Stable aortic tortuosity. Sheet like calcification bilaterally compatible with pleural plaques, progressive. There is no edema, consolidation, effusion, or pneumothorax. No acute osseous finding. IMPRESSION: 1. No acute finding. 2. Calcified pleural plaques. Electronically Signed   By: Monte Fantasia M.D.   On: 11/23/2017 22:08    EKG: Orders placed or performed during the hospital encounter of 11/23/17  . ED EKG  . ED EKG  . EKG 12-Lead  . EKG 12-Lead    IMPRESSION AND PLAN: 1 acute syncope Most likely secondary to recurrent severe vertigo resulting in collapse Referred to the observation unit, neuro checks per routine, rule out acute coronary syndrome with cardiac enzymes x3 sets, fall precautions, repeat orthostatics in the morning, meclizine as needed vertigo, check echocardiogram, carotid Dopplers, physical therapy to evaluate/treat, and continue close medical monitoring CT head did not show any acute process, chest x-ray negative, urinalysis negative  2 acute recurrent severe vertigo Meclizine as needed  3 acute on chronic anemia Most likely secondary to hemorrhoidal disease Check anemia workup and treat as indicated  4 chronic benign essential hypertension Currently uncontrolled Continue home regiment-we will give dose of Norvasc now, as needed hydralazine for systolic blood pressure greater than 160, vitals per routine, make changes as per necessary  5 chronic GERD without esophagitis PPI daily  6 chronic hypothyroidism Stable Continue Synthroid  Full code  Condition stable Prognosis fair DVT prophylaxis with heparin subcu Disposition to home on tomorrow barring any complications    All the records are reviewed and case discussed with ED provider. Management plans discussed with the patient, family  and they are in agreement.  CODE STATUS: Code Status History    Date Active Date Inactive Code Status Order ID Comments User Context   03/17/2016 02:33 03/18/2016 20:27 Full Code [id number]  Hubbard Robinson, MD ED       TOTAL TIME TAKING CARE OF THIS PATIENT: 40 minutes.    Avel Peace Salary M.D on 11/24/2017   Between 7am to 6pm - Pager - [phone]  After 6pm go to [email or web address] - password [email or web address] [facility]  Office  [phone]  CC: Primary care physician; Margo Common, PA   Note: This dictation was prepared with Dragon dictation along with smaller phrase technology. Any transcriptional errors that result from this process are unintentional.

## 2017-11-25 ENCOUNTER — Observation Stay: Payer: Medicare HMO | Admitting: Anesthesiology

## 2017-11-25 ENCOUNTER — Encounter
Admission: EM | Disposition: A | Discharge status: Home / Self Care | Payer: Self-pay | Source: Home / Self Care | Attending: Emergency Medicine

## 2017-11-25 ENCOUNTER — Encounter: Payer: Self-pay | Admitting: *Deleted

## 2017-11-25 ENCOUNTER — Other Ambulatory Visit: Payer: Self-pay

## 2017-11-25 DIAGNOSIS — E039 Hypothyroidism, unspecified: Secondary | ICD-10-CM | POA: Diagnosis not present

## 2017-11-25 DIAGNOSIS — R55 Syncope and collapse: Secondary | ICD-10-CM | POA: Diagnosis not present

## 2017-11-25 DIAGNOSIS — K210 Gastro-esophageal reflux disease with esophagitis: Secondary | ICD-10-CM | POA: Diagnosis not present

## 2017-11-25 DIAGNOSIS — M1990 Unspecified osteoarthritis, unspecified site: Secondary | ICD-10-CM | POA: Diagnosis not present

## 2017-11-25 DIAGNOSIS — K209 Esophagitis, unspecified: Secondary | ICD-10-CM | POA: Diagnosis not present

## 2017-11-25 DIAGNOSIS — K3189 Other diseases of stomach and duodenum: Secondary | ICD-10-CM | POA: Diagnosis not present

## 2017-11-25 DIAGNOSIS — K2970 Gastritis, unspecified, without bleeding: Secondary | ICD-10-CM | POA: Diagnosis not present

## 2017-11-25 DIAGNOSIS — K219 Gastro-esophageal reflux disease without esophagitis: Secondary | ICD-10-CM | POA: Diagnosis not present

## 2017-11-25 DIAGNOSIS — K922 Gastrointestinal hemorrhage, unspecified: Secondary | ICD-10-CM | POA: Diagnosis not present

## 2017-11-25 DIAGNOSIS — I10 Essential (primary) hypertension: Secondary | ICD-10-CM | POA: Diagnosis not present

## 2017-11-25 DIAGNOSIS — D649 Anemia, unspecified: Secondary | ICD-10-CM | POA: Diagnosis not present

## 2017-11-25 DIAGNOSIS — R42 Dizziness and giddiness: Secondary | ICD-10-CM | POA: Diagnosis not present

## 2017-11-25 HISTORY — PX: ESOPHAGOGASTRODUODENOSCOPY (EGD) WITH PROPOFOL: SHX5813

## 2017-11-25 LAB — TYPE AND SCREEN
ABO/RH(D): O POS
ANTIBODY SCREEN: NEGATIVE
Unit division: 0

## 2017-11-25 LAB — BASIC METABOLIC PANEL
Anion gap: 6 (ref 5–15)
BUN: 45 mg/dL — AB (ref 6–20)
CHLORIDE: 110 mmol/L (ref 101–111)
CO2: 22 mmol/L (ref 22–32)
Calcium: 8.5 mg/dL — ABNORMAL LOW (ref 8.9–10.3)
Creatinine, Ser: 2.53 mg/dL — ABNORMAL HIGH (ref 0.61–1.24)
GFR calc Af Amer: 25 mL/min — ABNORMAL LOW (ref >60)
GFR calc non Af Amer: 21 mL/min — ABNORMAL LOW (ref >60)
GLUCOSE: 93 mg/dL (ref 65–99)
POTASSIUM: 4.7 mmol/L (ref 3.5–5.1)
Sodium: 138 mmol/L (ref 135–145)

## 2017-11-25 LAB — CBC
HEMATOCRIT: 30.1 % — AB (ref 40.0–52.0)
HEMOGLOBIN: 9.8 g/dL — AB (ref 13.0–18.0)
MCH: 28.2 pg (ref 26.0–34.0)
MCHC: 32.8 g/dL (ref 32.0–36.0)
MCV: 86.2 fL (ref 80.0–100.0)
Platelets: 112 10*3/uL — ABNORMAL LOW (ref 150–440)
RBC: 3.49 MIL/uL — AB (ref 4.40–5.90)
RDW: 14.2 % (ref 11.5–14.5)
WBC: 6 10*3/uL (ref 3.8–10.6)

## 2017-11-25 LAB — BPAM RBC
Blood Product Expiration Date: 201812122359
ISSUE DATE / TIME: 201811271401
Unit Type and Rh: 5100

## 2017-11-25 LAB — ECHOCARDIOGRAM COMPLETE
Height: 72 in
Weight: 2550.4 oz

## 2017-11-25 SURGERY — ESOPHAGOGASTRODUODENOSCOPY (EGD) WITH PROPOFOL
Anesthesia: General

## 2017-11-25 MED ORDER — LEVOTHYROXINE SODIUM 50 MCG PO TABS
50.0000 ug | ORAL_TABLET | Freq: Every day | ORAL | Status: DC
  Administered 2017-11-26 – 2017-11-27 (×2): 50 ug via ORAL
  Filled 2017-11-25 (×2): qty 1

## 2017-11-25 MED ORDER — PROPOFOL 10 MG/ML IV BOLUS
INTRAVENOUS | Status: DC | PRN
  Administered 2017-11-25: 30 mg via INTRAVENOUS

## 2017-11-25 MED ORDER — PROPOFOL 10 MG/ML IV BOLUS
INTRAVENOUS | Status: AC
  Filled 2017-11-25: qty 20

## 2017-11-25 MED ORDER — SODIUM CHLORIDE 0.9 % IV SOLN
INTRAVENOUS | Status: DC
  Administered 2017-11-25 – 2017-11-27 (×3): via INTRAVENOUS

## 2017-11-25 MED ORDER — LIDOCAINE HCL (PF) 2 % IJ SOLN
INTRAMUSCULAR | Status: AC
  Filled 2017-11-25: qty 10

## 2017-11-25 MED ORDER — LIDOCAINE HCL (CARDIAC) 20 MG/ML IV SOLN
INTRAVENOUS | Status: DC | PRN
  Administered 2017-11-25: 50 mg via INTRAVENOUS

## 2017-11-25 MED ORDER — PEG 3350-KCL-NA BICARB-NACL 420 G PO SOLR
4000.0000 mL | Freq: Once | ORAL | Status: DC
  Filled 2017-11-25: qty 4000

## 2017-11-25 MED ORDER — PROPOFOL 500 MG/50ML IV EMUL
INTRAVENOUS | Status: DC | PRN
  Administered 2017-11-25: 160 ug/kg/min via INTRAVENOUS

## 2017-11-25 MED ORDER — SODIUM CHLORIDE 0.9 % IV SOLN
INTRAVENOUS | Status: DC
  Administered 2017-11-25: 11:00:00 via INTRAVENOUS

## 2017-11-25 NOTE — Anesthesia Post-op Follow-up Note (Signed)
Anesthesia QCDR form completed.        

## 2017-11-25 NOTE — OR Nursing (Signed)
RN SPOKE TO Azucena Fallen RE; COLONOSCOPY SCHEDULED TOMORROW AFTER LUNCH. RN REPORTS THAT HE HAS GOLYTE AVAILABLE NOW. RN I/S TO ADM PREP COMPLETED AND KEEP PT NPO AFTER MIDNIGHT. UNDERSTANDING VOICED

## 2017-11-25 NOTE — Interval H&P Note (Signed)
History and Physical Interval Note:  11/25/2017 11:16 AM  Glenn Mulling Sr.  has presented today for surgery, with the diagnosis of anemia, heme positive stool  The various methods of treatment have been discussed with the patient and family. After consideration of risks, benefits and other options for treatment, the patient has consented to  Procedure(s): ESOPHAGOGASTRODUODENOSCOPY (EGD) WITH PROPOFOL (N/A) COLONOSCOPY WITH PROPOFOL (N/A) as a surgical intervention .  The patient's history has been reviewed, patient examined, no change in status, stable for surgery.  I have reviewed the patient's chart and labs.  Questions were answered to the patient's satisfaction.     [place], [place]

## 2017-11-25 NOTE — Anesthesia Preprocedure Evaluation (Signed)
Anesthesia Evaluation  Patient identified by MRN, date of birth, ID band Patient awake    Reviewed: Allergy & Precautions, NPO status , Patient's Chart, lab work & pertinent test results, reviewed documented beta blocker date and time   History of Anesthesia Complications Negative for: history of anesthetic complications  Airway Mallampati: II  TM Distance: >3 FB     Dental  (+) Chipped, Upper Dentures, Lower Dentures   Pulmonary shortness of breath and with exertion, neg COPD, neg recent URI, former smoker,           Cardiovascular hypertension, Pt. on medications (-) angina(-) CAD, (-) Past MI, (-) Cardiac Stents and (-) CABG (-) dysrhythmias (-) Valvular Problems/Murmurs     Neuro/Psych negative neurological ROS  negative psych ROS   GI/Hepatic Neg liver ROS, GERD  Controlled,  Endo/Other  neg diabetesHypothyroidism   Renal/GU Renal InsufficiencyRenal disease     Musculoskeletal  (+) Arthritis ,   Abdominal   Peds  Hematology  (+) Blood dyscrasia, anemia ,   Anesthesia Other Findings Past Medical History: No date: Arthritis No date: GERD (gastroesophageal reflux disease) 1986: Hemorrhoid No date: Hyperlipidemia No date: Hypertension No date: Thyroid disease No date: Vertigo   Reproductive/Obstetrics negative OB ROS                             Anesthesia Physical  Anesthesia Plan  ASA: III  Anesthesia Plan: General   Post-op Pain Management:    Induction: Intravenous  PONV Risk Score and Plan: 1 and Propofol infusion  Airway Management Planned: Nasal Cannula  Additional Equipment:   Intra-op Plan:   Post-operative Plan:   Informed Consent: I have reviewed the patients History and Physical, chart, labs and discussed the procedure including the risks, benefits and alternatives for the proposed anesthesia with the patient or authorized representative who has indicated  his/her understanding and acceptance.     Plan Discussed with: CRNA  Anesthesia Plan Comments:         Anesthesia Quick Evaluation

## 2017-11-25 NOTE — Progress Notes (Signed)
Physical Therapy Treatment Patient Details Name: Glenn Buckbee Sr. MRN: [id number] DOB: Jun 28, 1927 Today's Date: 11/25/2017    History of Present Illness 83 y/o male who has been having increased issues with vertigo (no vertigo during PT exam) who had a fall at home.    PT Comments    Pt was able to increase ambulatory distance to 200 ft today with use of RW and one rest break.  Pt reported no pain during ambulation and mild fatigue.  Pt reported that he has a SPC at home which he would prefer to use and PT advised pt to use his RW and graduate to [facility] once he has regained strength.  Pt 5xSTS and TUG times indicate a fall risk with community ambulation. PT maintained close SBA during these activities but pt was able to manage independently with RW.  PT educated family and pt concerning importance of continuation of PT and management of health for return to full function.  Pt will continue to benefit from skilled PT with focus on balance, tolerance to activity and proper use of AD.   Follow Up Recommendations  Home health PT     Equipment Recommendations  Rolling walker with 5" wheels    Recommendations for Other Services       Precautions / Restrictions Precautions Precautions: Fall Restrictions Weight Bearing Restrictions: No    Mobility  Bed Mobility Overal bed mobility: Independent                Transfers Overall transfer level: Modified independent Equipment used: Rolling walker (2 wheeled)             General transfer comment: Pt able to perform STS with RW and supervision due to some impulsive movement.  Ambulation/Gait Ambulation/Gait assistance: Supervision Ambulation Distance (Feet): 200 Feet Assistive device: Rolling walker (2 wheeled)     Gait velocity interpretation: at or above normal speed for age/gender General Gait Details: Pt presents with enlarged bony prominence on L medial foot at base of metatarsal which pt reports is due to Gout and  does not produce pain currently.  Pt presents with overpronation Of L foot but is able to maintain a reciprocal gait pattern.  Pt presents with good foot clearance and step length and required one rest break during 270ft. of ambulation.   Stairs            Wheelchair Mobility    Modified Rankin (Stroke Patients Only)       Balance Overall balance assessment: Modified Independent                               Standardized Balance Assessment Standardized Balance Assessment : TUG: Timed Up and Go Test     Timed Up and Go Test TUG: Normal TUG Normal TUG (seconds): 35(Indicates fall risk during community ambulation for pt's age group.)    Cognition Arousal/Alertness: Awake/alert Behavior During Therapy: Impulsive Overall Cognitive Status: Within Functional Limits for tasks assessed                                        Exercises Other Exercises Other Exercises: 5xSTS: 40 seconds with use of UE.  PT provided education to pt concerning proper hand placement on RW and pt states that he is more comfortable not using the RW.  PT advised pt to use  RW as a conservative measure due to acute illness and to graduate to [facility] when he has regained strength.    General Comments        Pertinent Vitals/Pain Pain Assessment: No/denies pain    Home Living                      Prior Function            PT Goals (current goals can now be found in the care plan section) Acute Rehab PT Goals Patient Stated Goal: To return home and use a SPC instead of RW. PT Goal Formulation: With patient/family Time For Goal Achievement: 12/09/17 Potential to Achieve Goals: Good Progress towards PT goals: Progressing toward goals    Frequency    Min 2X/week      PT Plan Discharge plan needs to be updated    Co-evaluation              AM-PAC PT "6 Clicks" Daily Activity  Outcome Measure  Difficulty turning over in bed (including adjusting  bedclothes, sheets and blankets)?: None Difficulty moving from lying on back to sitting on the side of the bed? : None Difficulty sitting down on and standing up from a chair with arms (e.g., wheelchair, bedside commode, etc,.)?: A Little Help needed moving to and from a bed to chair (including a wheelchair)?: None Help needed walking in hospital room?: None Help needed climbing 3-5 steps with a railing? : A Little 6 Click Score: 22    End of Session Equipment Utilized During Treatment: Gait belt Activity Tolerance: Patient tolerated treatment well Patient left: in bed;with family/visitor present Nurse Communication: (communicated mobility status with case management.) PT Visit Diagnosis: Unsteadiness on feet (R26.81);Other abnormalities of gait and mobility (R26.89);History of falling (Z91.81)     Time: 1350-1415 PT Time Calculation (min) (ACUTE ONLY): 25 min  Charges:  $Therapeutic Exercise: 8-22 mins $Therapeutic Activity: 8-22 mins                    G Codes:  Functional Assessment Tool Used: AM-PAC 6 Clicks Basic Mobility   Roxanne Gates, PT, DPT   Roxanne Gates 11/25/2017, 2:24 PM

## 2017-11-25 NOTE — Care Management Obs Status (Signed)
[place] NOTIFICATION   Patient Details  Name: Glenn Netto Sr. MRN: [id number] Date of Birth: 1927-04-16   Medicare Observation Status Notification Given:  Yes    Beverly Sessions, RN 11/25/2017, 9:55 AM

## 2017-11-25 NOTE — Progress Notes (Signed)
[facility] [place] NAME: Glenn Jarvis    MR#:  [id number]  DATE OF BIRTH:  Jan 26, 1927  SUBJECTIVE:  CHIEF COMPLAINT:   Chief Complaint  Patient presents with  . Fall    came with Feeling dizzi, had dark stool and Hb dropped. Received Blood transfusion 11/24/17- feels better. Had endoscopy, showed gastritis.  REVIEW OF SYSTEMS:  CONSTITUTIONAL: No fever,positive for fatigue or weakness.  EYES: No blurred or double vision.  EARS, NOSE, AND THROAT: No tinnitus or ear pain.  RESPIRATORY: No cough, shortness of breath, wheezing or hemoptysis.  CARDIOVASCULAR: No chest pain, orthopnea, edema.  GASTROINTESTINAL: No nausea, vomiting, diarrhea or abdominal pain.  GENITOURINARY: No dysuria, hematuria.  ENDOCRINE: No polyuria, nocturia,  HEMATOLOGY: No anemia, easy bruising or bleeding SKIN: No rash or lesion. MUSCULOSKELETAL: No joint pain or arthritis.   NEUROLOGIC: No tingling, numbness, weakness.  PSYCHIATRY: No anxiety or depression.   ROS  DRUG ALLERGIES:   Allergies  Allergen Reactions  . Codeine   . Cortizone-10  [Hydrocortisone]     VITALS:  Blood pressure (!) 145/90, pulse 64, temperature 98 F (36.7 C), temperature source Oral, resp. rate 18, height 6' (1.829 m), weight 75.8 kg (167 lb), SpO2 100 %.  PHYSICAL EXAMINATION:  GENERAL:  83 y.o.-year-old patient lying in the bed with no acute distress.  EYES: Pupils equal, round, reactive to light and accommodation. No scleral icterus. Extraocular muscles intact.  HEENT: Head atraumatic, normocephalic. Oropharynx and nasopharynx clear.  NECK:  Supple, no jugular venous distention. No thyroid enlargement, no tenderness.  LUNGS: Normal breath sounds bilaterally, no wheezing, rales,rhonchi or crepitation. No use of accessory muscles of respiration.  CARDIOVASCULAR: S1, S2 normal. No murmurs, rubs, or gallops.  ABDOMEN: Soft, nontender, nondistended. Bowel sounds present. No  organomegaly or mass.  EXTREMITIES: No pedal edema, cyanosis, or clubbing.  NEUROLOGIC: Cranial nerves II through XII are intact. Muscle strength 4-5/5 in all extremities. Sensation intact. Gait not checked.   he have Finger nose test intact. Have chronic numbness and some weakness on left foot.  Prefer to keep his eyes closed as he feels dizzi opening it. PSYCHIATRIC: The patient is alert and oriented x 3.  SKIN: No obvious rash, lesion, or ulcer.   Physical Exam LABORATORY PANEL:   CBC Recent Labs  Lab 11/25/17 0419  WBC 6.0  HGB 9.8*  HCT 30.1*  PLT 112*   ------------------------------------------------------------------------------------------------------------------  Chemistries  Recent Labs  Lab 11/23/17 2121  11/25/17 0419  NA 139  --  138  K 4.9  --  4.7  CL 107  --  110  CO2 21*  --  22  GLUCOSE 116*  --  93  BUN 49*  --  45*  CREATININE 2.54*   < > 2.53*  CALCIUM 8.8*  --  8.5*  AST 25  --   --   ALT 14*  --   --   ALKPHOS 88  --   --   BILITOT 0.6  --   --    < > = values in this interval not displayed.   ------------------------------------------------------------------------------------------------------------------  Cardiac Enzymes Recent Labs  Lab 11/24/17 0710 11/24/17 1235  TROPONINI 0.05* 0.04*   ------------------------------------------------------------------------------------------------------------------  RADIOLOGY:  Mr Brain Wo Contrast  Result Date: 11/24/2017 CLINICAL DATA:  83 y/o  M; multiple episodes of vertigo and syncope. EXAM: MRI HEAD WITHOUT CONTRAST TECHNIQUE: Multiplanar, multiecho pulse sequences of the brain and surrounding structures were obtained without  intravenous contrast. COMPARISON:  11/23/2017 CT head. FINDINGS: Brain: Motion degradation of multiple sequences. No acute infarction, hemorrhage, or focal mass effect identified. Several nonspecific foci of T2 FLAIR hyperintense signal abnormality in subcortical and  periventricular white matter are compatible with moderate chronic microvascular ischemic changes for age. Mild brain parenchymal volume loss. Vascular: Normal flow voids. Skull and upper cervical spine: Normal marrow signal. Sinuses/Orbits: Mild paranasal sinus mucosal thickening. Right maxillary sinus small mucous retention cyst. No abnormal signal of mastoid air cells. Bilateral intra-ocular lens replacement. Other: None. IMPRESSION: 1. Motion degradation on multiple sequences. 2. No acute intracranial abnormality identified. 3. Moderate chronic microvascular ischemic changes and mild parenchymal volume loss of the brain. 4. Mild paranasal sinus disease. Electronically Signed   By: Kristine Garbe M.D.   On: 11/24/2017 21:08   US Carotid Bilateral  Result Date: 11/24/2017 CLINICAL DATA:  Syncope and collapse.  History of hypertension. EXAM: BILATERAL CAROTID DUPLEX ULTRASOUND TECHNIQUE: Pearline Cables scale imaging, color Doppler and duplex ultrasound were performed of bilateral carotid and vertebral arteries in the neck. COMPARISON:  None. FINDINGS: Criteria: Quantification of carotid stenosis is based on velocity parameters that correlate the residual internal carotid diameter with NASCET-based stenosis levels, using the diameter of the distal internal carotid lumen as the denominator for stenosis measurement. The following velocity measurements were obtained: RIGHT ICA:  171/51 cm/sec CCA:  67/8 cm/sec SYSTOLIC ICA/CCA RATIO:  2.9 DIASTOLIC ICA/CCA RATIO:  6.0 ECA:  83 cm/sec LEFT ICA:  119/33 cm/sec CCA:  93/81 cm/sec SYSTOLIC ICA/CCA RATIO:  1.6 DIASTOLIC ICA/CCA RATIO:  2.3 ECA:  77 cm/sec RIGHT CAROTID ARTERY: There is a minimal to moderate amount of mixed echogenic plaque within the right carotid bulb (image 17), extending to involve the origin and proximal aspects of the right internal carotid artery (image 25), resulting elevated peak systolic velocities within the mid and distal aspects of the right  internal carotid artery (greatest acquired peak systolic velocity within the mid right ICA measures 171 cm/sec). RIGHT VERTEBRAL ARTERY:  Antegrade Flow LEFT CAROTID ARTERY: There is a moderate amount of echogenic partially shadowing plaque with the distal aspect of the left common carotid artery (image 45), extending to involve the left carotid bulb (image 50), origin and proximal aspects of the left internal carotid artery (image 58), not resulting in elevated peak systolic velocities in the left internal carotid artery to suggest a hemodynamically significant stenosis. LEFT VERTEBRAL ARTERY:  Antegrade flow IMPRESSION: 1. Moderate amount of right-sided atherosclerotic plaque results in elevated peak systolic velocities with the right internal carotid artery compatible with the 50-69% luminal narrowing range. 2. Moderate amount of left-sided atherosclerotic plaque, not definitely resulting in a hemodynamically significant stenosis. Electronically Signed   By: Sandi Mariscal M.D.   On: 11/24/2017 10:15    ASSESSMENT AND PLAN:   Active Problems:   Syncope and collapse  1 acute syncope Most likely secondary to recurrent severe vertigo resulting in collapse  neuro checks per routine,   ruled out acute coronary syndrome with cardiac enzymes x3 stable, fall precautions. repeat orthostatics in the morning does not show much drop, meclizine as needed vertigo, check echocardiogram, carotid Dopplers- have moderate blockages,  physical therapy to evaluate/treat, and continue close medical monitoring CT head did not show any acute process, chest x-ray negative, urinalysis negative   MRI brain negative.   Feels much better after blood transfusion.  2 acute recurrent severe vertigo Meclizine as needed  3 acute on chronic anemia- GI blood loss- GI bleed.  Most likely secondary to hemorrhoidal disease Received transfusion, GI consult appreciated. Endoscopy showed gastritis. Colonoscopy tomorrow.  4  chronic benign essential hypertension Currently uncontrolled Continue home regiment-we will give dose of Norvasc now, as needed hydralazine for systolic blood pressure greater than 160, vitals per routine, make changes as per necessary may have to hold some now as NPO.  5 chronic GERD without esophagitis PPI IV BID with bleed now.  6 chronic hypothyroidism Stable Continue Synthroid  All the records are reviewed and case discussed with Care Management/Social Workerr. Management plans discussed with the patient, family and they are in agreement.  CODE STATUS: Full.  TOTAL TIME TAKING CARE OF THIS PATIENT: 35 minutes.   POSSIBLE D/C IN 1-2 DAYS, DEPENDING ON CLINICAL CONDITION.   Vaughan Basta M.D on 11/25/2017   Between 7am to 6pm - Pager - [phone]  After 6pm go to [email or web address] - password [email or web address] [facility]  Office  [phone]  CC: Primary care physician; Margo Common, PA  Note: This dictation was prepared with Dragon dictation along with smaller phrase technology. Any transcriptional errors that result from this process are unintentional.

## 2017-11-25 NOTE — Op Note (Signed)
[facility] Patient Name: Glenn Jarvis Procedure Date: 11/25/2017 11:25 AM MRN: [id number] Account #: [id number] Date of Birth: July 28, 1927 Admit Type: Inpatient Age: 83 Room: [facility] ENDO ROOM 4 Gender: Male Note Status: Finalized Procedure:            Upper GI endoscopy Indications:          Recent gastrointestinal bleeding, Suspected upper                        gastrointestinal bleeding in patient with unexplained                        iron deficiency anemia Providers:            Benay Pike. Alice Reichert MD, MD Referring MD:         Vickki Muff. Chrismon, MD (Referring MD) Medicines:            Propofol per Anesthesia Complications:        No immediate complications. Procedure:            Pre-Anesthesia Assessment:                       - The risks and benefits of the procedure and the                        sedation options and risks were discussed with the                        patient. All questions were answered and informed                        consent was obtained.                       - Patient identification and proposed procedure were                        verified prior to the procedure by the nurse. The                        procedure was verified in the procedure room.                       - ASA Grade Assessment: III - A patient with severe                        systemic disease.                       - After reviewing the risks and benefits, the patient                        was deemed in satisfactory condition to undergo the                        procedure.                       After obtaining informed consent, the endoscope was  passed under direct vision. Throughout the procedure,                        the patient's blood pressure, pulse, and oxygen                        saturations were monitored continuously. The Endoscope                        was introduced through the mouth, and advanced to the                         third part of duodenum. The upper GI endoscopy was                        accomplished without difficulty. The patient tolerated                        the procedure well. Findings:      LA Grade A (one or more mucosal breaks less than 5 mm, not extending       between tops of 2 mucosal folds) esophagitis with no bleeding was found       37 cm from the incisors.      Diffuse mildly erythematous mucosa without bleeding was found in the       stomach.      The examined duodenum was normal. Impression:           - LA Grade A esophagitis.                       - Erythematous mucosa in the stomach.                       - Normal examined duodenum.                       - No specimens collected. Recommendation:       - Return patient to hospital ward for ongoing care.                       - Clear liquid diet today.                       - Perform a colonoscopy tomorrow.                       - The findings and recommendations were discussed with                        the patient. Procedure Code(s):    --- Professional ---                       [phone], Esophagogastroduodenoscopy, flexible, transoral;                        diagnostic, including collection of specimen(s) by                        brushing or washing, when performed (separate procedure) Diagnosis Code(s):    --- Professional ---  K20.9, Esophagitis, unspecified                       K31.89, Other diseases of stomach and duodenum                       K92.2, Gastrointestinal hemorrhage, unspecified                       D50.9, Iron deficiency anemia, unspecified CPT copyright 2016 American Medical Association. All rights reserved. The codes documented in this report are preliminary and upon coder review may  be revised to meet current compliance requirements. Efrain Sella MD, MD 11/25/2017 11:41:35 AM This report has been signed electronically. Number of Addenda: 0 Note  Initiated On: 11/25/2017 11:25 AM      [facility]

## 2017-11-25 NOTE — Progress Notes (Signed)
Per MD okay for RN to hold morning medications. Except beta blockers.

## 2017-11-25 NOTE — Care Management (Signed)
Patient admitted for syncope.  Patient lives at home alone.  Adult children live locally for support, and provide transportation.  PCP Chrismon.  Pharmacy [facility].  PT has assessed patient and recommends SNF.  Patient has declined SNF, and agreeable to home health services.  Home health agency preference provided. [facility] selected, heads up referral made to [facility] with [facility].  Patient has cane and RW in the home for ambulation.  RNCM following

## 2017-11-25 NOTE — Transfer of Care (Signed)
Immediate Anesthesia Transfer of Care Note  Patient: Glenn Eschmann Sr.  Procedure(s) Performed: ESOPHAGOGASTRODUODENOSCOPY (EGD) WITH PROPOFOL (N/A )  Patient Location: PACU  Anesthesia Type:General  Level of Consciousness: sedated  Airway & Oxygen Therapy: Patient Spontanous Breathing and Patient connected to nasal cannula oxygen  Post-op Assessment: Report given to RN and Post -op Vital signs reviewed and stable  Post vital signs: Reviewed and stable  Last Vitals:  Vitals:   11/25/17 0933 11/25/17 1045  BP: (!) 178/84 (!) 173/88  Pulse: 60 67  Resp:  20  Temp:  (!) 36.2 C  SpO2:  99%    Last Pain:  Vitals:   11/25/17 1045  TempSrc: Tympanic  PainSc:          Complications: No apparent anesthesia complications

## 2017-11-25 NOTE — Anesthesia Procedure Notes (Signed)
Date/Time: 11/25/2017 11:27 AM Performed by: Johnna Acosta, CRNA Pre-anesthesia Checklist: Patient identified, Emergency Drugs available, Suction available, Patient being monitored and Timeout performed Patient Re-evaluated:Patient Re-evaluated prior to induction Oxygen Delivery Method: Nasal cannula

## 2017-11-26 ENCOUNTER — Encounter: Payer: Self-pay | Admitting: Internal Medicine

## 2017-11-26 DIAGNOSIS — D649 Anemia, unspecified: Secondary | ICD-10-CM | POA: Diagnosis not present

## 2017-11-26 DIAGNOSIS — R42 Dizziness and giddiness: Secondary | ICD-10-CM | POA: Diagnosis not present

## 2017-11-26 DIAGNOSIS — K922 Gastrointestinal hemorrhage, unspecified: Secondary | ICD-10-CM | POA: Diagnosis not present

## 2017-11-26 DIAGNOSIS — R55 Syncope and collapse: Secondary | ICD-10-CM | POA: Diagnosis not present

## 2017-11-26 MED ORDER — MAGNESIUM CITRATE PO SOLN
1.0000 | Freq: Once | ORAL | Status: AC
  Administered 2017-11-26: 1 via ORAL
  Filled 2017-11-26: qty 296

## 2017-11-26 MED ORDER — PANTOPRAZOLE SODIUM 40 MG PO TBEC
40.0000 mg | DELAYED_RELEASE_TABLET | Freq: Every day | ORAL | Status: DC
Start: 2017-11-27 — End: 2017-11-27

## 2017-11-26 NOTE — H&P (View-Only) (Signed)
Subjective: Patient seen for f/u GI bleed. Had mostly unremarkable EGD yesterday, was consented and scheduled for colonoscopy this AM, but failed to drink 70% of the prep. Only one bm this AM.  Objective: Vital signs in last 24 hours: Temp:  [98 F (36.7 C)-98.3 F (36.8 C)] 98.2 F (36.8 C) (11/29 1205) Pulse Rate:  [57-69] 63 (11/29 1205) Resp:  [15-20] 15 (11/29 1205) BP: (124-167)/(66-92) 135/92 (11/29 1205) SpO2:  [98 %-100 %] 98 % (11/29 1205) Weight:  [71.8 kg (158 lb 3.2 oz)] 71.8 kg (158 lb 3.2 oz) (11/29 0548) Blood pressure (!) 135/92, pulse 63, temperature 98.2 F (36.8 C), temperature source Oral, resp. rate 15, height 6' (1.829 m), weight 71.8 kg (158 lb 3.2 oz), SpO2 98 %.   Intake/Output from previous day: 11/28 0701 - 11/29 0700 In: 1300 [P.O.:1020; I.V.:280] Out: 775 [Urine:775]  Intake/Output this shift: Total I/O In: 483 [P.O.:483] Out: 500 [Urine:500]   General appearance:  Alert, NAD. Resp:  CTA no wheezes Cardio:  RRR, no gallop GI: Abd, soft, BS+ Extremities:  No edema. Pulses 2+.   Lab Results: No results found for this or any previous visit (from the past 24 hour(s)).   Recent Labs    11/23/17 2121 11/24/17 0710 11/24/17 1816 11/25/17 0419  WBC 9.5 5.9  --  6.0  HGB 9.1* 7.7* 10.0* 9.8*  HCT 27.5* 24.2* 29.5* 30.1*  PLT 123* 117*  --  112*   BMET Recent Labs    11/23/17 2121 11/24/17 0710 11/25/17 0419  NA 139  --  138  K 4.9  --  4.7  CL 107  --  110  CO2 21*  --  22  GLUCOSE 116*  --  93  BUN 49*  --  45*  CREATININE 2.54* 2.40* 2.53*  CALCIUM 8.8*  --  8.5*   LFT Recent Labs    11/23/17 2121  PROT 7.5  ALBUMIN 3.8  AST 25  ALT 14*  ALKPHOS 88  BILITOT 0.6   PT/INR Recent Labs    11/23/17 2119  LABPROT 14.0  INR 1.09   Hepatitis Panel No results for input(s): HEPBSAG, HCVAB, HEPAIGM, HEPBIGM in the last 72 hours. C-Diff No results for input(s): CDIFFTOX in the last 72 hours. No results for input(s):  CDIFFPCR in the last 72 hours.   Studies/Results: Mr Brain Wo Contrast  Result Date: 11/24/2017 CLINICAL DATA:  83 y/o  M; multiple episodes of vertigo and syncope. EXAM: MRI HEAD WITHOUT CONTRAST TECHNIQUE: Multiplanar, multiecho pulse sequences of the brain and surrounding structures were obtained without intravenous contrast. COMPARISON:  11/23/2017 CT head. FINDINGS: Brain: Motion degradation of multiple sequences. No acute infarction, hemorrhage, or focal mass effect identified. Several nonspecific foci of T2 FLAIR hyperintense signal abnormality in subcortical and periventricular white matter are compatible with moderate chronic microvascular ischemic changes for age. Mild brain parenchymal volume loss. Vascular: Normal flow voids. Skull and upper cervical spine: Normal marrow signal. Sinuses/Orbits: Mild paranasal sinus mucosal thickening. Right maxillary sinus small mucous retention cyst. No abnormal signal of mastoid air cells. Bilateral intra-ocular lens replacement. Other: None. IMPRESSION: 1. Motion degradation on multiple sequences. 2. No acute intracranial abnormality identified. 3. Moderate chronic microvascular ischemic changes and mild parenchymal volume loss of the brain. 4. Mild paranasal sinus disease. Electronically Signed   By: Kristine Garbe M.D.   On: 11/24/2017 21:08    Scheduled Inpatient Medications:   . amLODipine  10 mg Oral BID  . docusate sodium  100  mg Oral BID  . famotidine  20 mg Oral BID  . feeding supplement (ENSURE ENLIVE)  237 mL Oral TID BM  . gabapentin  100 mg Oral QHS  . hydrALAZINE  25 mg Oral TID  . levothyroxine  50 mcg Oral QAC breakfast  . losartan  100 mg Oral Daily  . metoprolol tartrate  12.5 mg Oral BID  . multivitamin with minerals  1 tablet Oral Daily  . pantoprazole (PROTONIX) IV  40 mg Intravenous Q12H  . piroxicam  20 mg Oral Daily  . sodium chloride flush  3 mL Intravenous Q12H  . sodium chloride flush  3 mL Intravenous Q12H     Continuous Inpatient Infusions:   . sodium chloride    . sodium chloride    . sodium chloride 20 mL/hr at 11/25/17 2050    PRN Inpatient Medications:  sodium chloride, acetaminophen **OR** acetaminophen, furosemide, hydrALAZINE, HYDROcodone-acetaminophen, meclizine, ondansetron **OR** ondansetron (ZOFRAN) IV, polyethylene glycol, sodium chloride flush  Miscellaneous: N/A  Assessment:  1. Anemia secondary to GI blood loss.   Plan:  1. Reprep, reschedule colonoscopy for tomorrow. Patient agreeable to stay on clears until tomorrow and proceed with prepping again today. The patient understands the nature of the planned procedure, indications, risks, alternatives and potential complications including but not limited to bleeding, infection, perforation, damage to internal organs and possible oversedation/side effects from anesthesia. The patient agrees and gives consent to proceed.  Please refer to procedure notes for findings, recommendations and patient disposition/instructions.  Teodoro K. Alice Reichert, M.D. 11/26/2017, 1:08 PM

## 2017-11-26 NOTE — Progress Notes (Signed)
Per MD okay for RN to place order for clear liquids. Pt colonoscopy was reschedule because pt did not finish his bowel prep last night. Will try to do colonoscopy tomorrow.

## 2017-11-26 NOTE — Anesthesia Postprocedure Evaluation (Signed)
Anesthesia Post Note  Patient: Glenn Buttery Sr.  Procedure(s) Performed: ESOPHAGOGASTRODUODENOSCOPY (EGD) WITH PROPOFOL (N/A )  Patient location during evaluation: Endoscopy Anesthesia Type: General Level of consciousness: awake and alert Pain management: pain level controlled Vital Signs Assessment: post-procedure vital signs reviewed and stable Respiratory status: spontaneous breathing, nonlabored ventilation, respiratory function stable and patient connected to nasal cannula oxygen Cardiovascular status: blood pressure returned to baseline and stable Postop Assessment: no apparent nausea or vomiting Anesthetic complications: no     Last Vitals:  Vitals:   11/26/17 1205 11/26/17 1615  BP: (!) 135/92 (!) 150/68  Pulse: 63   Resp: 15   Temp: 36.8 C   SpO2: 98%     Last Pain:  Vitals:   11/26/17 1205  TempSrc: Oral  PainSc:                  Martha Clan

## 2017-11-26 NOTE — Progress Notes (Signed)
[place] at [place] NAME: Glenn Jarvis    MR#:  [id number]  DATE OF BIRTH:  03/31/1927  SUBJECTIVE:  CHIEF COMPLAINT:   Chief Complaint  Patient presents with  . Fall   The patient feels better, no melena or bloody stool.  The preparation for colonoscopy is not enough this morning, which was rescheduled for tomorrow. REVIEW OF SYSTEMS:  CONSTITUTIONAL: No fever,positive for fatigue or weakness.  EYES: No blurred or double vision.  EARS, NOSE, AND THROAT: No tinnitus or ear pain.  RESPIRATORY: No cough, shortness of breath, wheezing or hemoptysis.  CARDIOVASCULAR: No chest pain, orthopnea, edema.  GASTROINTESTINAL: No nausea, vomiting, diarrhea or abdominal pain.  GENITOURINARY: No dysuria, hematuria.  ENDOCRINE: No polyuria, nocturia,  HEMATOLOGY: No anemia, easy bruising or bleeding SKIN: No rash or lesion. MUSCULOSKELETAL: No joint pain or arthritis.   NEUROLOGIC: No tingling, numbness, weakness.  PSYCHIATRY: No anxiety or depression.   ROS  DRUG ALLERGIES:   Allergies  Allergen Reactions  . Codeine   . Cortizone-10  [Hydrocortisone]     VITALS:  Blood pressure (!) 150/68, pulse 63, temperature 98.2 F (36.8 C), temperature source Oral, resp. rate 15, height 6' (1.829 m), weight 158 lb 3.2 oz (71.8 kg), SpO2 98 %.  PHYSICAL EXAMINATION:  GENERAL:  83 y.o.-year-old patient lying in the bed with no acute distress.  EYES: Pupils equal, round, reactive to light and accommodation. No scleral icterus. Extraocular muscles intact.  HEENT: Head atraumatic, normocephalic. Oropharynx and nasopharynx clear.  NECK:  Supple, no jugular venous distention. No thyroid enlargement, no tenderness.  LUNGS: Normal breath sounds bilaterally, no wheezing, rales,rhonchi or crepitation. No use of accessory muscles of respiration.  CARDIOVASCULAR: S1, S2 normal. No murmurs, rubs, or gallops.  ABDOMEN: Soft, nontender, nondistended. Bowel sounds  present. No organomegaly or mass.  EXTREMITIES: No pedal edema, cyanosis, or clubbing.  NEUROLOGIC: Cranial nerves II through XII are intact. Muscle strength 4-5/5 in all extremities. Sensation intact. Gait not checked.   he have Finger nose test intact. Have chronic numbness and some weakness on left foot.  Prefer to keep his eyes closed as he feels dizzi opening it. PSYCHIATRIC: The patient is alert and oriented x 3.  SKIN: No obvious rash, lesion, or ulcer.   Physical Exam LABORATORY PANEL:   CBC Recent Labs  Lab 11/25/17 0419  WBC 6.0  HGB 9.8*  HCT 30.1*  PLT 112*   ------------------------------------------------------------------------------------------------------------------  Chemistries  Recent Labs  Lab 11/23/17 2121  11/25/17 0419  NA 139  --  138  K 4.9  --  4.7  CL 107  --  110  CO2 21*  --  22  GLUCOSE 116*  --  93  BUN 49*  --  45*  CREATININE 2.54*   < > 2.53*  CALCIUM 8.8*  --  8.5*  AST 25  --   --   ALT 14*  --   --   ALKPHOS 88  --   --   BILITOT 0.6  --   --    < > = values in this interval not displayed.   ------------------------------------------------------------------------------------------------------------------  Cardiac Enzymes Recent Labs  Lab 11/24/17 0710 11/24/17 1235  TROPONINI 0.05* 0.04*   ------------------------------------------------------------------------------------------------------------------  RADIOLOGY:  Mr Brain Wo Contrast  Result Date: 11/24/2017 CLINICAL DATA:  83 y/o  M; multiple episodes of vertigo and syncope. EXAM: MRI HEAD WITHOUT CONTRAST TECHNIQUE: Multiplanar, multiecho pulse sequences of the brain and surrounding  structures were obtained without intravenous contrast. COMPARISON:  11/23/2017 CT head. FINDINGS: Brain: Motion degradation of multiple sequences. No acute infarction, hemorrhage, or focal mass effect identified. Several nonspecific foci of T2 FLAIR hyperintense signal abnormality in  subcortical and periventricular white matter are compatible with moderate chronic microvascular ischemic changes for age. Mild brain parenchymal volume loss. Vascular: Normal flow voids. Skull and upper cervical spine: Normal marrow signal. Sinuses/Orbits: Mild paranasal sinus mucosal thickening. Right maxillary sinus small mucous retention cyst. No abnormal signal of mastoid air cells. Bilateral intra-ocular lens replacement. Other: None. IMPRESSION: 1. Motion degradation on multiple sequences. 2. No acute intracranial abnormality identified. 3. Moderate chronic microvascular ischemic changes and mild parenchymal volume loss of the brain. 4. Mild paranasal sinus disease. Electronically Signed   By: Kristine Garbe M.D.   On: 11/24/2017 21:08    ASSESSMENT AND PLAN:   Active Problems:   Syncope and collapse  1 acute syncope Most likely secondary to recurrent severe vertigo resulting in collapse  neuro checks per routine,   ruled out acute coronary syndrome with cardiac enzymes x3 stable, fall precautions. repeat orthostatics in the morning does not show much drop, meclizine as needed vertigo, check echocardiogram, carotid Dopplers- have moderate blockages,  physical therapy to evaluate/treat, and continue close medical monitoring CT head did not show any acute process, chest x-ray negative, urinalysis negative   MRI brain negative.   Feels much better after blood transfusion.  2 acute recurrent severe vertigo Meclizine as needed  3 acute on chronic anemia- GI blood loss- GI bleed. Most likely secondary to hemorrhoidal disease Received transfusion, GI consult appreciated. Hemoglobin is stable. Endoscopy showed gastritis. Colonoscopy tomorrow.  4 chronic benign essential hypertension Better controlled Continue home HTN meds, as needed hydralazine for systolic blood pressure greater than 160, vitals per routine, make changes as per necessary may have to hold some now as  NPO.  5 chronic GERD without esophagitis PPI daily.  6 chronic hypothyroidism Stable Continue Synthroid  Generalized weakness.  PT evaluation suggest home health and PT.  All the records are reviewed and case discussed with Care Management/Social Workerr. Management plans discussed with the patient, family and they are in agreement.  CODE STATUS: Full.  TOTAL TIME TAKING CARE OF THIS PATIENT: 35 minutes.   POSSIBLE D/C IN 1-2 DAYS, DEPENDING ON CLINICAL CONDITION.   Demetrios Loll M.D on 11/26/2017   Between 7am to 6pm - Pager - [phone]  After 6pm go to [email or web address] - password [email or web address] [facility]  Office  [phone]  CC: Primary care physician; Margo Common, PA  Note: This dictation was prepared with Dragon dictation along with smaller phrase technology. Any transcriptional errors that result from this process are unintentional.

## 2017-11-26 NOTE — Progress Notes (Signed)
Subjective: Patient seen for f/u GI bleed. Had mostly unremarkable EGD yesterday, was consented and scheduled for colonoscopy this AM, but failed to drink 70% of the prep. Only one bm this AM.  Objective: Vital signs in last 24 hours: Temp:  [98 F (36.7 C)-98.3 F (36.8 C)] 98.2 F (36.8 C) (11/29 1205) Pulse Rate:  [57-69] 63 (11/29 1205) Resp:  [15-20] 15 (11/29 1205) BP: (124-167)/(66-92) 135/92 (11/29 1205) SpO2:  [98 %-100 %] 98 % (11/29 1205) Weight:  [71.8 kg (158 lb 3.2 oz)] 71.8 kg (158 lb 3.2 oz) (11/29 0548) Blood pressure (!) 135/92, pulse 63, temperature 98.2 F (36.8 C), temperature source Oral, resp. rate 15, height 6' (1.829 m), weight 71.8 kg (158 lb 3.2 oz), SpO2 98 %.   Intake/Output from previous day: 11/28 0701 - 11/29 0700 In: 1300 [P.O.:1020; I.V.:280] Out: 775 [Urine:775]  Intake/Output this shift: Total I/O In: 483 [P.O.:483] Out: 500 [Urine:500]   General appearance:  Alert, NAD. Resp:  CTA no wheezes Cardio:  RRR, no gallop GI: Abd, soft, BS+ Extremities:  No edema. Pulses 2+.   Lab Results: No results found for this or any previous visit (from the past 24 hour(s)).   Recent Labs    11/23/17 2121 11/24/17 0710 11/24/17 1816 11/25/17 0419  WBC 9.5 5.9  --  6.0  HGB 9.1* 7.7* 10.0* 9.8*  HCT 27.5* 24.2* 29.5* 30.1*  PLT 123* 117*  --  112*   BMET Recent Labs    11/23/17 2121 11/24/17 0710 11/25/17 0419  NA 139  --  138  K 4.9  --  4.7  CL 107  --  110  CO2 21*  --  22  GLUCOSE 116*  --  93  BUN 49*  --  45*  CREATININE 2.54* 2.40* 2.53*  CALCIUM 8.8*  --  8.5*   LFT Recent Labs    11/23/17 2121  PROT 7.5  ALBUMIN 3.8  AST 25  ALT 14*  ALKPHOS 88  BILITOT 0.6   PT/INR Recent Labs    11/23/17 2119  LABPROT 14.0  INR 1.09   Hepatitis Panel No results for input(s): HEPBSAG, HCVAB, HEPAIGM, HEPBIGM in the last 72 hours. C-Diff No results for input(s): CDIFFTOX in the last 72 hours. No results for input(s):  CDIFFPCR in the last 72 hours.   Studies/Results: Mr Brain Wo Contrast  Result Date: 11/24/2017 CLINICAL DATA:  83 y/o  M; multiple episodes of vertigo and syncope. EXAM: MRI HEAD WITHOUT CONTRAST TECHNIQUE: Multiplanar, multiecho pulse sequences of the brain and surrounding structures were obtained without intravenous contrast. COMPARISON:  11/23/2017 CT head. FINDINGS: Brain: Motion degradation of multiple sequences. No acute infarction, hemorrhage, or focal mass effect identified. Several nonspecific foci of T2 FLAIR hyperintense signal abnormality in subcortical and periventricular white matter are compatible with moderate chronic microvascular ischemic changes for age. Mild brain parenchymal volume loss. Vascular: Normal flow voids. Skull and upper cervical spine: Normal marrow signal. Sinuses/Orbits: Mild paranasal sinus mucosal thickening. Right maxillary sinus small mucous retention cyst. No abnormal signal of mastoid air cells. Bilateral intra-ocular lens replacement. Other: None. IMPRESSION: 1. Motion degradation on multiple sequences. 2. No acute intracranial abnormality identified. 3. Moderate chronic microvascular ischemic changes and mild parenchymal volume loss of the brain. 4. Mild paranasal sinus disease. Electronically Signed   By: Kristine Garbe M.D.   On: 11/24/2017 21:08    Scheduled Inpatient Medications:   . amLODipine  10 mg Oral BID  . docusate sodium  100  mg Oral BID  . famotidine  20 mg Oral BID  . feeding supplement (ENSURE ENLIVE)  237 mL Oral TID BM  . gabapentin  100 mg Oral QHS  . hydrALAZINE  25 mg Oral TID  . levothyroxine  50 mcg Oral QAC breakfast  . losartan  100 mg Oral Daily  . metoprolol tartrate  12.5 mg Oral BID  . multivitamin with minerals  1 tablet Oral Daily  . pantoprazole (PROTONIX) IV  40 mg Intravenous Q12H  . piroxicam  20 mg Oral Daily  . sodium chloride flush  3 mL Intravenous Q12H  . sodium chloride flush  3 mL Intravenous Q12H     Continuous Inpatient Infusions:   . sodium chloride    . sodium chloride    . sodium chloride 20 mL/hr at 11/25/17 2050    PRN Inpatient Medications:  sodium chloride, acetaminophen **OR** acetaminophen, furosemide, hydrALAZINE, HYDROcodone-acetaminophen, meclizine, ondansetron **OR** ondansetron (ZOFRAN) IV, polyethylene glycol, sodium chloride flush  Miscellaneous: N/A  Assessment:  1. Anemia secondary to GI blood loss.   Plan:  1. Reprep, reschedule colonoscopy for tomorrow. Patient agreeable to stay on clears until tomorrow and proceed with prepping again today. The patient understands the nature of the planned procedure, indications, risks, alternatives and potential complications including but not limited to bleeding, infection, perforation, damage to internal organs and possible oversedation/side effects from anesthesia. The patient agrees and gives consent to proceed.  Please refer to procedure notes for findings, recommendations and patient disposition/instructions.  Jp Eastham K. Alice Reichert, M.D. 11/26/2017, 1:08 PM

## 2017-11-26 NOTE — Progress Notes (Signed)
Pt finished his Golytely.

## 2017-11-26 NOTE — Progress Notes (Signed)
Notified Dr Vicente Males that patient has not completed bowel pre and unable to tolerate anymore. Dr stated that the colonoscopy will be rescheduled. Patient currently sleep in bed. Will continue to monitor patient.

## 2017-11-27 ENCOUNTER — Observation Stay: Payer: Medicare HMO | Admitting: Anesthesiology

## 2017-11-27 ENCOUNTER — Encounter
Admission: EM | Disposition: A | Discharge status: Home / Self Care | Payer: Self-pay | Source: Home / Self Care | Attending: Emergency Medicine

## 2017-11-27 ENCOUNTER — Telehealth: Payer: Self-pay | Admitting: Family Medicine

## 2017-11-27 ENCOUNTER — Encounter: Payer: Self-pay | Admitting: *Deleted

## 2017-11-27 DIAGNOSIS — R42 Dizziness and giddiness: Secondary | ICD-10-CM | POA: Diagnosis not present

## 2017-11-27 DIAGNOSIS — K5730 Diverticulosis of large intestine without perforation or abscess without bleeding: Secondary | ICD-10-CM | POA: Diagnosis not present

## 2017-11-27 DIAGNOSIS — D649 Anemia, unspecified: Secondary | ICD-10-CM | POA: Diagnosis not present

## 2017-11-27 DIAGNOSIS — K640 First degree hemorrhoids: Secondary | ICD-10-CM | POA: Diagnosis not present

## 2017-11-27 DIAGNOSIS — I10 Essential (primary) hypertension: Secondary | ICD-10-CM | POA: Diagnosis not present

## 2017-11-27 DIAGNOSIS — K648 Other hemorrhoids: Secondary | ICD-10-CM | POA: Diagnosis not present

## 2017-11-27 DIAGNOSIS — K5520 Angiodysplasia of colon without hemorrhage: Secondary | ICD-10-CM | POA: Diagnosis not present

## 2017-11-27 DIAGNOSIS — K5521 Angiodysplasia of colon with hemorrhage: Secondary | ICD-10-CM | POA: Diagnosis not present

## 2017-11-27 DIAGNOSIS — M1990 Unspecified osteoarthritis, unspecified site: Secondary | ICD-10-CM | POA: Diagnosis not present

## 2017-11-27 DIAGNOSIS — K922 Gastrointestinal hemorrhage, unspecified: Secondary | ICD-10-CM | POA: Diagnosis not present

## 2017-11-27 DIAGNOSIS — E039 Hypothyroidism, unspecified: Secondary | ICD-10-CM | POA: Diagnosis not present

## 2017-11-27 DIAGNOSIS — R55 Syncope and collapse: Secondary | ICD-10-CM | POA: Diagnosis not present

## 2017-11-27 HISTORY — PX: COLONOSCOPY: SHX5424

## 2017-11-27 LAB — HM COLONOSCOPY

## 2017-11-27 LAB — HEMOGLOBIN: Hemoglobin: 10.7 g/dL — ABNORMAL LOW (ref 13.0–18.0)

## 2017-11-27 SURGERY — COLONOSCOPY WITH PROPOFOL
Anesthesia: General

## 2017-11-27 SURGERY — COLONOSCOPY
Anesthesia: General

## 2017-11-27 MED ORDER — PROPOFOL 500 MG/50ML IV EMUL
INTRAVENOUS | Status: AC
  Filled 2017-11-27: qty 50

## 2017-11-27 MED ORDER — PROPOFOL 10 MG/ML IV BOLUS
INTRAVENOUS | Status: AC
  Filled 2017-11-27: qty 20

## 2017-11-27 MED ORDER — EPHEDRINE SULFATE 50 MG/ML IJ SOLN
INTRAMUSCULAR | Status: AC
  Filled 2017-11-27: qty 1

## 2017-11-27 MED ORDER — PROPOFOL 10 MG/ML IV BOLUS
INTRAVENOUS | Status: DC | PRN
  Administered 2017-11-27: 20 mg via INTRAVENOUS
  Administered 2017-11-27: 60 mg via INTRAVENOUS

## 2017-11-27 MED ORDER — PANTOPRAZOLE SODIUM 40 MG PO TBEC: 40.0000 mg | DELAYED_RELEASE_TABLET | Freq: Every day | ORAL | 0 refills | Status: DC

## 2017-11-27 MED ORDER — LIDOCAINE HCL (CARDIAC) 20 MG/ML IV SOLN
INTRAVENOUS | Status: DC | PRN
  Administered 2017-11-27: 40 mg via INTRAVENOUS

## 2017-11-27 MED ORDER — LIDOCAINE HCL (PF) 2 % IJ SOLN
INTRAMUSCULAR | Status: AC
  Filled 2017-11-27: qty 10

## 2017-11-27 MED ORDER — EPHEDRINE SULFATE 50 MG/ML IJ SOLN
INTRAMUSCULAR | Status: DC | PRN
  Administered 2017-11-27: 5 mg via INTRAVENOUS
  Administered 2017-11-27: 10 mg via INTRAVENOUS

## 2017-11-27 MED ORDER — PROPOFOL 500 MG/50ML IV EMUL
INTRAVENOUS | Status: DC | PRN
  Administered 2017-11-27: 140 ug/kg/min via INTRAVENOUS

## 2017-11-27 MED ORDER — SUCCINYLCHOLINE CHLORIDE 20 MG/ML IJ SOLN
INTRAMUSCULAR | Status: AC
  Filled 2017-11-27: qty 1

## 2017-11-27 NOTE — Transfer of Care (Signed)
Immediate Anesthesia Transfer of Care Note  Patient: Glenn Stumph Sr.  Procedure(s) Performed: COLONOSCOPY (N/A )  Patient Location: PACU  Anesthesia Type:General  Level of Consciousness: awake, alert  and oriented  Airway & Oxygen Therapy: Patient Spontanous Breathing and Patient connected to nasal cannula oxygen  Post-op Assessment: Report given to RN and Post -op Vital signs reviewed and stable  Post vital signs: Reviewed and stable  Last Vitals:  Vitals:   11/27/17 1409 11/27/17 1503  BP: (!) 162/71 95/63  Pulse: 61 72  Resp: 20 17  Temp: 36.8 C 36.9 C  SpO2: 100% 98%    Last Pain:  Vitals:   11/27/17 1409  TempSrc: Tympanic  PainSc: 0-No pain         Complications: No apparent anesthesia complications

## 2017-11-27 NOTE — Telephone Encounter (Signed)
Pt is being discharged from [facility] today for Cyncope.  I have scheduled a hospital follow up appointment/MW

## 2017-11-27 NOTE — Discharge Summary (Signed)
[place] at [place] NAME: Glenn Jarvis    MR#:  [id number]  DATE OF BIRTH:  September 03, 1927  DATE OF ADMISSION:  11/23/2017   ADMITTING PHYSICIAN: Avel Peace Salary, MD  DATE OF DISCHARGE: 11/27/2017  PRIMARY CARE PHYSICIAN: Chrismon, Vickki Muff, PA   ADMISSION DIAGNOSIS:  Syncope and collapse [R55] Gastrointestinal hemorrhage, unspecified gastrointestinal hemorrhage type [K92.2] Syncope, unspecified syncope type [R55] Vomiting without nausea, intractability of vomiting not specified, unspecified vomiting type [R11.11] Incontinence of feces, unspecified fecal incontinence type [R15.9] DISCHARGE DIAGNOSIS:  Active Problems:   Syncope and collapse  SECONDARY DIAGNOSIS:   Past Medical History:  Diagnosis Date  . Arthritis   . GERD (gastroesophageal reflux disease)   . Hemorrhoid 1986  . Hyperlipidemia   . Hypertension   . Thyroid disease   . Vertigo    HOSPITAL COURSE:   1acute syncope Most likely secondary to recurrent severe vertigo resulting in collapse  neuro checks per routine,   ruled out acute coronary syndrome with cardiac enzymes x3 stable, fall precautions. repeat orthostatics in the morning does not show much drop, meclizine as needed vertigo, check echocardiogram, carotid Dopplers- have moderate blockages,  physical therapy to evaluate/treat, and continue close medical monitoring CT head did not show any acute process, chest x-ray negative, urinalysis negative   MRI brain negative.   Feels much better after blood transfusion.  2acute recurrent severe vertigo Meclizine as needed  3acute on chronic anemia- GI blood loss- GI bleed. Most likely secondary to hemorrhoidal disease Received transfusion, GI consult appreciated. Hemoglobin is stable. Endoscopy showed gastritis. Changed to po protonix. Colonoscopy today: - Diverticulosis in the sigmoid colon. - A single non-bleeding colonic angioectasia. Treated with  argon plasma coagulation (APC). - Two non-bleeding colonic angioectasias. Treated with argon plasma coagulation (APC). - Non-bleeding internal hemorrhoids. - The examination was otherwise normal. No needed to follow-up GI as outpatient per Dr. Alice Reichert, Benay Pike.  4chronic benign essential hypertension Better controlled Continue home HTN meds, as needed hydralazine for systolic blood pressure greater than 160, vitals per routine, make changes as per necessary  5chronic GERD without esophagitis PPI daily.  6chronic hypothyroidism Stable Continue Synthroid  Generalized weakness.  PT evaluation suggest home health and PT. I discussed with Dr, Alice Reichert, Benay Pike, MD. DISCHARGE CONDITIONS:  Stable, discharge home with home health and PT today. CONSULTS OBTAINED:  Treatment Team:  Efrain Sella, MD DRUG ALLERGIES:   Allergies  Allergen Reactions  . Codeine   . Cortizone-10  [Hydrocortisone]    DISCHARGE MEDICATIONS:   Allergies as of 11/27/2017      Reactions   Codeine    Cortizone-10  [hydrocortisone]       Medication List    STOP taking these medications   amLODipine 10 MG tablet Commonly known as:  NORVASC   Baclofen 5 MG Tabs   piroxicam 20 MG capsule Commonly known as:  FELDENE     TAKE these medications   colchicine 0.6 MG tablet Take 0.6 mg by mouth 2 (two) times daily.   furosemide 20 MG tablet Commonly known as:  LASIX TAKE 1 TABLET BY MOUTH ONCE DAILY FOR EDEMA/BP   gabapentin 100 MG capsule Commonly known as:  NEURONTIN TAKE 1 CAPSULE BY MOUTH 3 TIMES DAILY   hydrALAZINE 25 MG tablet Commonly known as:  APRESOLINE Take 25 mg by mouth 3 (three) times daily.   levothyroxine 50 MCG tablet Commonly known as:  SYNTHROID, LEVOTHROID TAKE 1  TABLET BY MOUTH ONCE DAILY FOR THYROID, ON AN EMPTY STOMACH. WAIT 30 MINUTES BEFORE TAKING OTHER MEDS.   losartan 100 MG tablet Commonly known as:  COZAAR TAKE 1 TABLET BY MOUTH ONCE DAILY FOR BLOOD  PRESSURE (HIGH)   metoprolol tartrate 25 MG tablet Commonly known as:  LOPRESSOR Take 0.5 tablets (12.5 mg total) by mouth 2 (two) times daily.   pantoprazole 40 MG tablet Commonly known as:  PROTONIX Take 1 tablet (40 mg total) by mouth daily. Start taking on:  11/28/2017   ranitidine 150 MG capsule Commonly known as:  ZANTAC Take 1 capsule (150 mg total) by mouth 2 (two) times daily.   traMADol 50 MG tablet Commonly known as:  ULTRAM Take 1 tablet (50 mg total) by mouth every 8 (eight) hours as needed.   triamcinolone 0.025 % cream Commonly known as:  KENALOG TRIAMCINOLONE ACETONIDE, 0.025% (External Cream)  1 (one) Cream Cream apply daily as needed for 0 days  Quantity: 60;  Refills: 0   Ordered :13-Apr-2014  [name] ;  Started 13-Apr-2014 Active Comments: Medication taken as needed.        DISCHARGE INSTRUCTIONS:  See AVS.  If you experience worsening of your admission symptoms, develop shortness of breath, life threatening emergency, suicidal or homicidal thoughts you must seek medical attention immediately by calling 911 or calling your MD immediately  if symptoms less severe.  You Must read complete instructions/literature along with all the possible adverse reactions/side effects for all the Medicines you take and that have been prescribed to you. Take any new Medicines after you have completely understood and accpet all the possible adverse reactions/side effects.   Please note  You were cared for by a hospitalist during your hospital stay. If you have any questions about your discharge medications or the care you received while you were in the hospital after you are discharged, you can call the unit and asked to speak with the hospitalist on call if the hospitalist that took care of you is not available. Once you are discharged, your primary care physician will handle any further medical issues. Please note that NO REFILLS for any discharge medications  will be authorized once you are discharged, as it is imperative that you return to your primary care physician (or establish a relationship with a primary care physician if you do not have one) for your aftercare needs so that they can reassess your need for medications and monitor your lab values.    On the day of Discharge:  VITAL SIGNS:  Blood pressure 138/80, pulse 60, temperature 97.6 F (36.4 C), temperature source Oral, resp. rate 16, height 6' (1.829 m), weight 158 lb (71.7 kg), SpO2 100 %. PHYSICAL EXAMINATION:  GENERAL:  83 y.o.-year-old patient lying in the bed with no acute distress.  EYES: Pupils equal, round, reactive to light and accommodation. No scleral icterus. Extraocular muscles intact.  HEENT: Head atraumatic, normocephalic. Oropharynx and nasopharynx clear.  NECK:  Supple, no jugular venous distention. No thyroid enlargement, no tenderness.  LUNGS: Normal breath sounds bilaterally, no wheezing, rales,rhonchi or crepitation. No use of accessory muscles of respiration.  CARDIOVASCULAR: S1, S2 normal. No murmurs, rubs, or gallops.  ABDOMEN: Soft, non-tender, non-distended. Bowel sounds present. No organomegaly or mass.  EXTREMITIES: No pedal edema, cyanosis, or clubbing.  NEUROLOGIC: Cranial nerves II through XII are intact. Muscle strength 5/5 in all extremities. Sensation intact. Gait not checked.  PSYCHIATRIC: The patient is alert and oriented x 3.  SKIN: No  obvious rash, lesion, or ulcer.  DATA REVIEW:   CBC Recent Labs  Lab 11/25/17 0419 11/27/17 0450  WBC 6.0  --   HGB 9.8* 10.7*  HCT 30.1*  --   PLT 112*  --     Chemistries  Recent Labs  Lab 11/23/17 2121  11/25/17 0419  NA 139  --  138  K 4.9  --  4.7  CL 107  --  110  CO2 21*  --  22  GLUCOSE 116*  --  93  BUN 49*  --  45*  CREATININE 2.54*   < > 2.53*  CALCIUM 8.8*  --  8.5*  AST 25  --   --   ALT 14*  --   --   ALKPHOS 88  --   --   BILITOT 0.6  --   --    < > = values in this interval  not displayed.     Microbiology Results  Results for orders placed or performed during the hospital encounter of 03/17/16  MRSA PCR Screening     Status: None   Collection Time: 03/17/16  3:43 AM  Result Value Ref Range Status   MRSA by PCR NEGATIVE NEGATIVE Final    Comment:        The GeneXpert MRSA Assay (FDA approved for NASAL specimens only), is one component of a comprehensive MRSA colonization surveillance program. It is not intended to diagnose MRSA infection nor to guide or monitor treatment for MRSA infections.     RADIOLOGY:  No results found.   Management plans discussed with the patient, family and they are in agreement.  CODE STATUS: Full Code   TOTAL TIME TAKING CARE OF THIS PATIENT: 35 minutes.    Demetrios Loll M.D on 11/27/2017 at 3:58 PM  Between 7am to 6pm - Pager - [phone]  After 6pm go to [email or web address] - Proofreader  [facility]  Office  [phone]  CC: Primary care physician; Margo Common, PA   Note: This dictation was prepared with Dragon dictation along with smaller phrase technology. Any transcriptional errors that result from this process are unintentional.

## 2017-11-27 NOTE — Progress Notes (Signed)
Patient returned from endoscopy.  I will inform Dr Bridgett Larsson that the patient is back before discharging him home

## 2017-11-27 NOTE — Op Note (Signed)
[facility] Patient Name: Glenn Jarvis Procedure Date: 11/27/2017 2:35 PM MRN: [id number] Account #: [id number] Date of Birth: September 25, 1927 Admit Type: Inpatient Age: 83 Room: [facility] ENDO ROOM 3 Gender: Male Note Status: Finalized Procedure:            Colonoscopy Indications:          Evaluation of unexplained GI bleeding, Unexplained iron                        deficiency anemia Providers:            Benay Pike. Alice Reichert MD, MD Referring MD:         Vickki Muff. Chrismon, MD (Referring MD) Medicines:            Propofol per Anesthesia Complications:        No immediate complications. Procedure:            Pre-Anesthesia Assessment:                       - The risks and benefits of the procedure and the                        sedation options and risks were discussed with the                        patient. All questions were answered and informed                        consent was obtained.                       - Patient identification and proposed procedure were                        verified prior to the procedure by the nurse. The                        procedure was verified in the procedure room.                       - ASA Grade Assessment: III - A patient with severe                        systemic disease.                       - After reviewing the risks and benefits, the patient                        was deemed in satisfactory condition to undergo the                        procedure.                       After obtaining informed consent, the colonoscope was                        passed under direct vision. Throughout the procedure,  the patient's blood pressure, pulse, and oxygen                        saturations were monitored continuously. The                        Colonoscope was introduced through the anus and                        advanced to the the terminal ileum, with identification      of the appendiceal orifice and IC valve. The                        colonoscopy was performed without difficulty. The                        patient tolerated the procedure well. The quality of                        the bowel preparation was good. The terminal ileum,                        ileocecal valve, appendiceal orifice, and rectum were                        photographed. Findings:      The perianal and digital rectal examinations were normal. Pertinent       negatives include normal sphincter tone, no palpable rectal lesions and       normal prostate (size, shape, and consistency).      A few small-mouthed diverticula were found in the sigmoid colon.      A single medium-sized localized angioectasia without bleeding was found       in the cecum. Coagulation for hemostasis using argon plasma at 0.8       liters/minute and 20 watts was successful. Estimated blood loss was       minimal.      Two medium-sized patchy angioectasias without bleeding were found in the       proximal ascending colon. Coagulation for hemostasis using argon plasma       at 0.8 liters/minute and 20 watts was successful. Estimated blood loss       was minimal.      Non-bleeding internal hemorrhoids were found during retroflexion. The       hemorrhoids were Grade I (internal hemorrhoids that do not prolapse).      The exam was otherwise without abnormality.      The terminal ileum appeared normal. Impression:           - Diverticulosis in the sigmoid colon.                       - A single non-bleeding colonic angioectasia. Treated                        with argon plasma coagulation (APC).                       - Two non-bleeding colonic angioectasias. Treated with                        argon plasma coagulation (  APC).                       - Non-bleeding internal hemorrhoids.                       - The examination was otherwise normal.                       - The examined portion of the ileum was  normal.                       - No specimens collected. Recommendation:       - Return patient to hospital ward for observation.                       - Resume previous diet.                       - No recommendation at this time regarding repeat                        colonoscopy due to age.                       - The findings and recommendations were discussed with                        the patient. Procedure Code(s):    --- Professional ---                       [id number], Colonoscopy, flexible; with control of bleeding,                        any method Diagnosis Code(s):    --- Professional ---                       K64.0, First degree hemorrhoids                       K55.20, Angiodysplasia of colon without hemorrhage                       K92.2, Gastrointestinal hemorrhage, unspecified                       D50.9, Iron deficiency anemia, unspecified                       K57.30, Diverticulosis of large intestine without                        perforation or abscess without bleeding CPT copyright 2016 American Medical Association. All rights reserved. The codes documented in this report are preliminary and upon coder review may  be revised to meet current compliance requirements. Efrain Sella MD, MD 11/27/2017 3:05:42 PM This report has been signed electronically. Number of Addenda: 0 Note Initiated On: 11/27/2017 2:35 PM Scope Withdrawal Time: 0 hours 13 minutes 25 seconds  Total Procedure Duration: 0 hours 15 minutes 43 seconds       [facility]

## 2017-11-27 NOTE — Progress Notes (Signed)
Patient transported to endo via bed by orderly

## 2017-11-27 NOTE — Progress Notes (Signed)
Discharge instructions reviewed with the patient and his daughter.  Patient pushed out via wheelchair to his daughters waiting car

## 2017-11-27 NOTE — Anesthesia Preprocedure Evaluation (Signed)
Anesthesia Evaluation  Patient identified by MRN, date of birth, ID band Patient awake    Reviewed: Allergy & Precautions, NPO status , Patient's Chart, lab work & pertinent test results  History of Anesthesia Complications Negative for: history of anesthetic complications  Airway Mallampati: III  TM Distance: >3 FB Neck ROM: Full    Dental  (+) Edentulous Upper, Edentulous Lower   Pulmonary neg sleep apnea, neg COPD, former smoker,    breath sounds clear to auscultation- rhonchi (-) wheezing      Cardiovascular hypertension, Pt. on medications (-) CAD, (-) Past MI and (-) Cardiac Stents  Rhythm:Regular Rate:Normal - Systolic murmurs and - Diastolic murmurs    Neuro/Psych negative neurological ROS  negative psych ROS   GI/Hepatic Neg liver ROS, GERD  ,GIB   Endo/Other  neg diabetesHypothyroidism   Renal/GU Renal InsufficiencyRenal disease     Musculoskeletal  (+) Arthritis ,   Abdominal (+) - obese,   Peds  Hematology negative hematology ROS (+)   Anesthesia Other Findings Past Medical History: No date: Arthritis No date: GERD (gastroesophageal reflux disease) 1986: Hemorrhoid No date: Hyperlipidemia No date: Hypertension No date: Thyroid disease No date: Vertigo   Reproductive/Obstetrics                             Anesthesia Physical Anesthesia Plan  ASA: III  Anesthesia Plan: General   Post-op Pain Management:    Induction: Intravenous  PONV Risk Score and Plan: 2 and Propofol infusion  Airway Management Planned: Natural Airway  Additional Equipment:   Intra-op Plan:   Post-operative Plan:   Informed Consent: I have reviewed the patients History and Physical, chart, labs and discussed the procedure including the risks, benefits and alternatives for the proposed anesthesia with the patient or authorized representative who has indicated his/her understanding and  acceptance.   Dental advisory given  Plan Discussed with: CRNA and Anesthesiologist  Anesthesia Plan Comments:         Anesthesia Quick Evaluation

## 2017-11-27 NOTE — Progress Notes (Signed)
Golytely and mag citrate finished. Bowel prep complete

## 2017-11-27 NOTE — Care Management Note (Signed)
Case Management Note  Patient Details  Name: Glenn Bohorquez Sr. MRN: [id number] Date of Birth: 07-17-1927   Patient to discharge home today after colonoscopy.  [name] notified of discharge.  RNCM signing off.    Subjective/Objective:                    Action/Plan:   Expected Discharge Date:  11/27/17               Expected Discharge Plan:  [place]  In-House Referral:     Discharge planning Services  CM Consult  Post Acute Care Choice:    Choice offered to:  Adult Children  DME Arranged:    DME Agency:     HH Arranged:  RN, PT [place] Agency:  [place]  Status of Service:  Completed, signed off  If discussed at [place] of Stay Meetings, dates discussed:    Additional Comments:  Beverly Sessions, RN 11/27/2017, 9:59 AM

## 2017-11-27 NOTE — OR Nursing (Signed)
1540 report to erica rn on 2c. Pt  S/p colonoscopy with argoned AVMS. ADVANCE TO REGULAR DIET. TO BE TRANSPORTED BACK TO FLOOR BY ORDERLY

## 2017-11-27 NOTE — Anesthesia Post-op Follow-up Note (Signed)
Anesthesia QCDR form completed.        

## 2017-11-27 NOTE — Anesthesia Postprocedure Evaluation (Signed)
Anesthesia Post Note  Patient: Glenn Piper Sr.  Procedure(s) Performed: COLONOSCOPY (N/A )  Patient location during evaluation: Endoscopy Anesthesia Type: General Level of consciousness: awake and alert and oriented Pain management: pain level controlled Vital Signs Assessment: post-procedure vital signs reviewed and stable Respiratory status: spontaneous breathing, nonlabored ventilation and respiratory function stable Cardiovascular status: blood pressure returned to baseline and stable Postop Assessment: no signs of nausea or vomiting Anesthetic complications: no     Last Vitals:  Vitals:   11/27/17 1409 11/27/17 1503  BP: (!) 162/71 95/63  Pulse: 61 72  Resp: 20 17  Temp: 36.8 C 36.9 C  SpO2: 100% 98%    Last Pain:  Vitals:   11/27/17 1409  TempSrc: Tympanic  PainSc: 0-No pain                 Charnice Zwilling

## 2017-11-27 NOTE — Progress Notes (Signed)
PT Cancellation Note:  Pt currently off floor for procedure.  Will re-attempt PT treatment at a later date/time.  Leitha Bleak, PT 11/27/17, 2:37 PM [phone]

## 2017-11-27 NOTE — Interval H&P Note (Signed)
History and Physical Interval Note:  11/27/2017 2:36 PM  Glenn Mulling Sr.  has presented today for surgery, with the diagnosis of anemia secondary to GI blood loss  The various methods of treatment have been discussed with the patient and family. After consideration of risks, benefits and other options for treatment, the patient has consented to  Procedure(s): COLONOSCOPY (N/A) as a surgical intervention .  The patient's history has been reviewed, patient examined, no change in status, stable for surgery.  I have reviewed the patient's chart and labs.  Questions were answered to the patient's satisfaction.     Bernard, [place]

## 2017-11-27 NOTE — Discharge Instructions (Signed)
Heart healthy diet. HHPT

## 2017-11-30 ENCOUNTER — Other Ambulatory Visit: Payer: Self-pay | Admitting: Podiatry

## 2017-11-30 ENCOUNTER — Other Ambulatory Visit: Payer: Self-pay | Admitting: Family Medicine

## 2017-11-30 ENCOUNTER — Encounter: Payer: Self-pay | Admitting: Internal Medicine

## 2017-11-30 DIAGNOSIS — I471 Supraventricular tachycardia: Secondary | ICD-10-CM

## 2017-11-30 MED ORDER — METOPROLOL TARTRATE 25 MG PO TABS: 12.5000 mg | ORAL_TABLET | Freq: Two times a day (BID) | ORAL | 3 refills | Status: DC

## 2017-11-30 NOTE — Telephone Encounter (Signed)
[facility] faxed a refill request for the following medication. Thanks CC   metoprolol tartrate (LOPRESSOR) 25 MG tablet

## 2017-12-01 IMAGING — CR DG HIP (WITH OR WITHOUT PELVIS) 2-3V*L*
1 series · 3 of 3 positions shown · non-contrast
Comparison: CT scan of the pelvis dated May 10, 2014

CLINICAL DATA: Posterior left hip pain for the past 1-2 weeks with
no known injury. History of prostate malignancy as well as
arthritis.

EXAM:
DG HIP (WITH OR WITHOUT PELVIS) 2-3V LEFT

[Series 1: dg hip unilat w or w/o pelvis 2-3 views  · non-contrast · 0.14mm/px · 3 of 3 slices shown]
[im 1/3]
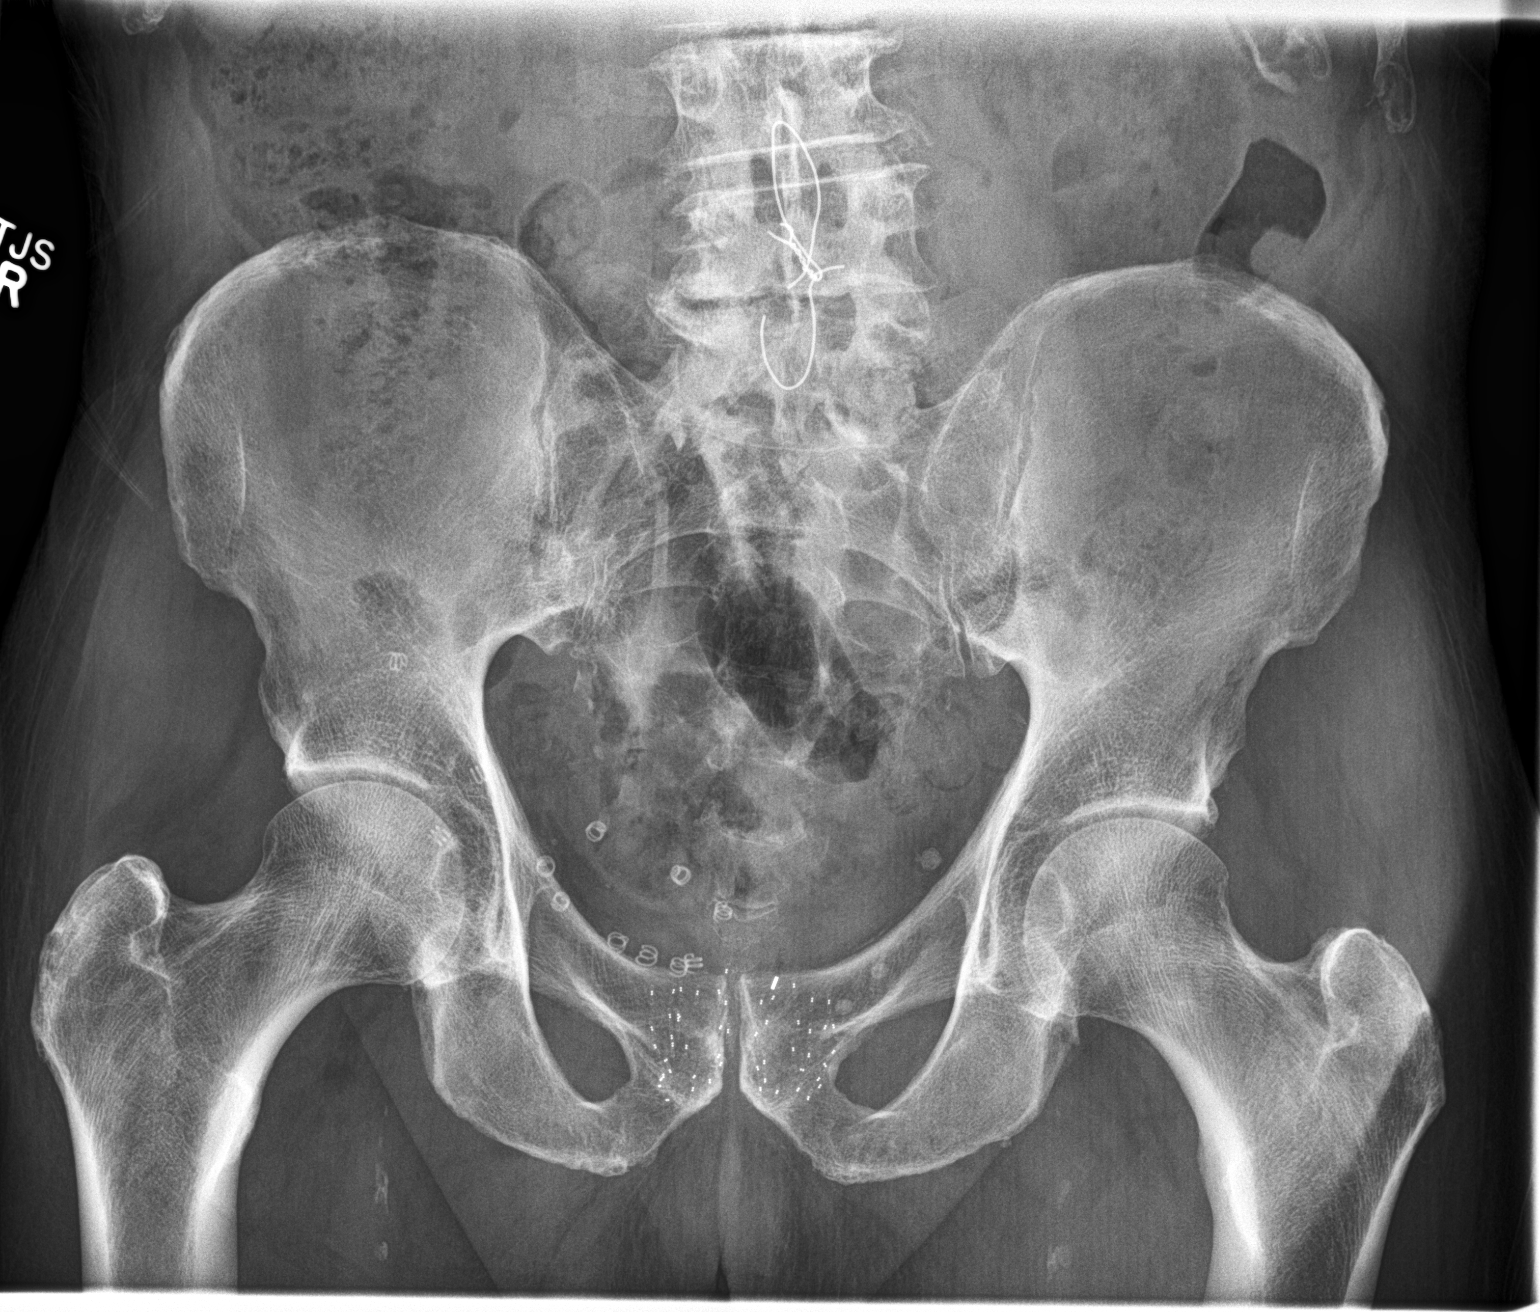
[im 2/3]
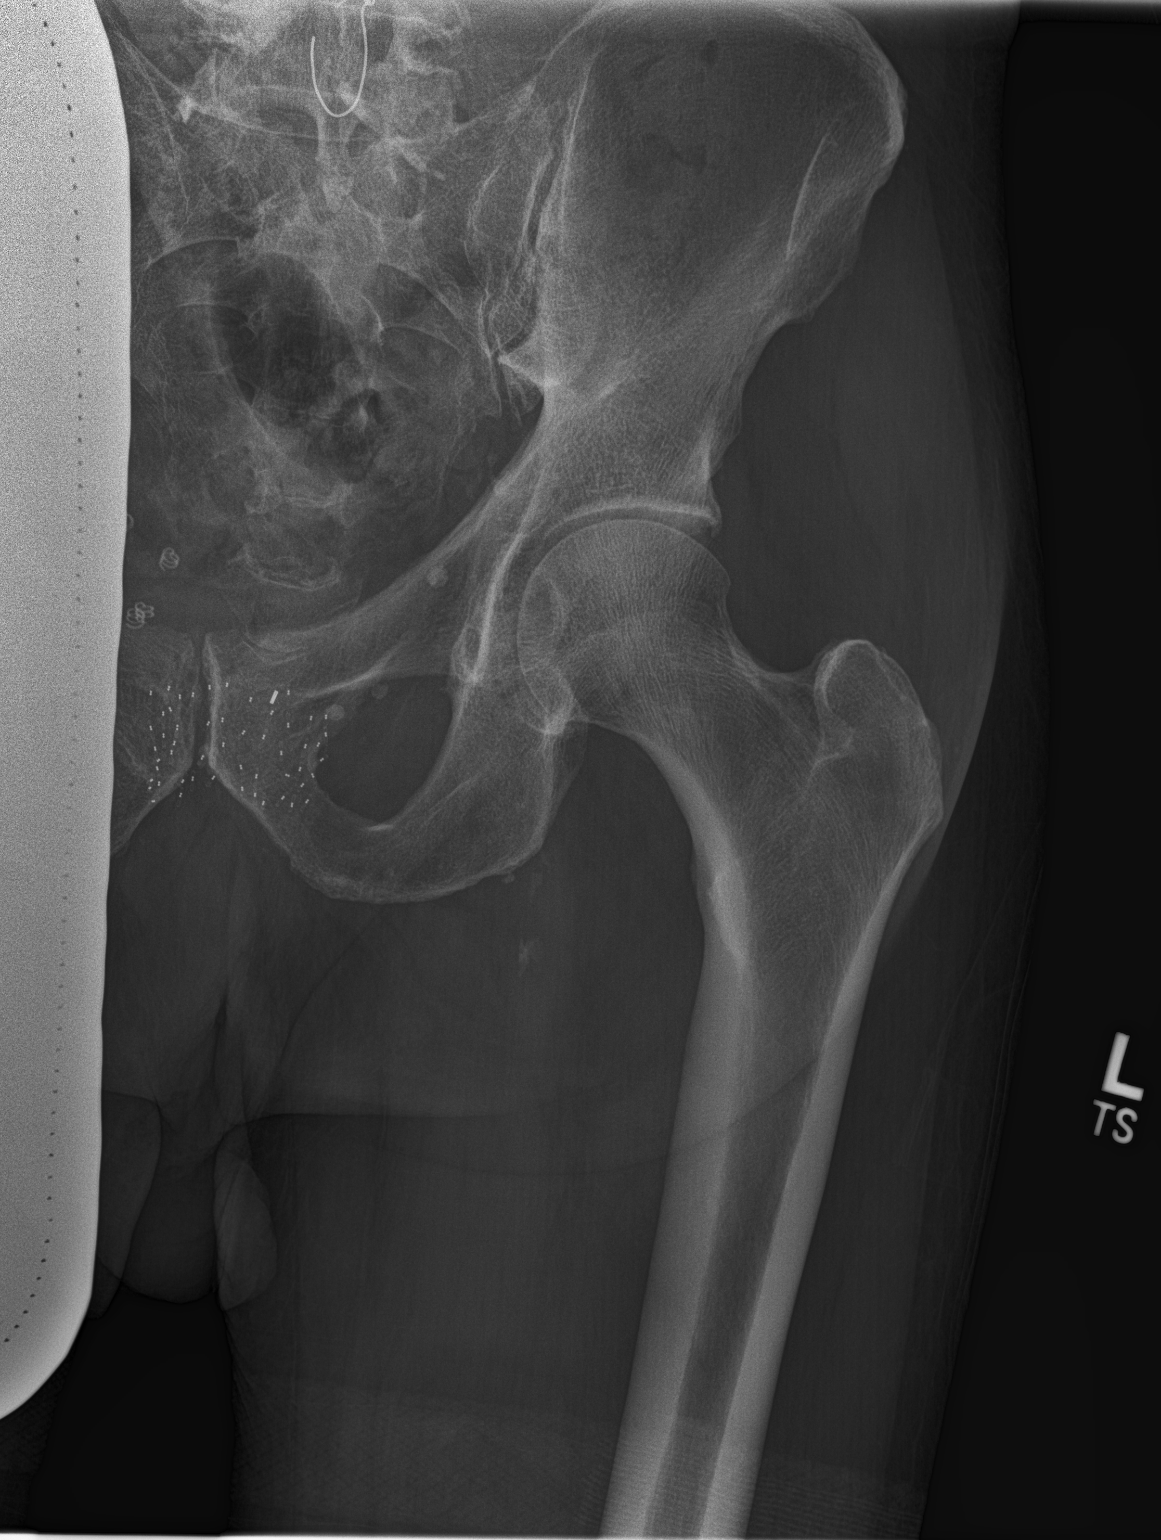
[im 3/3]
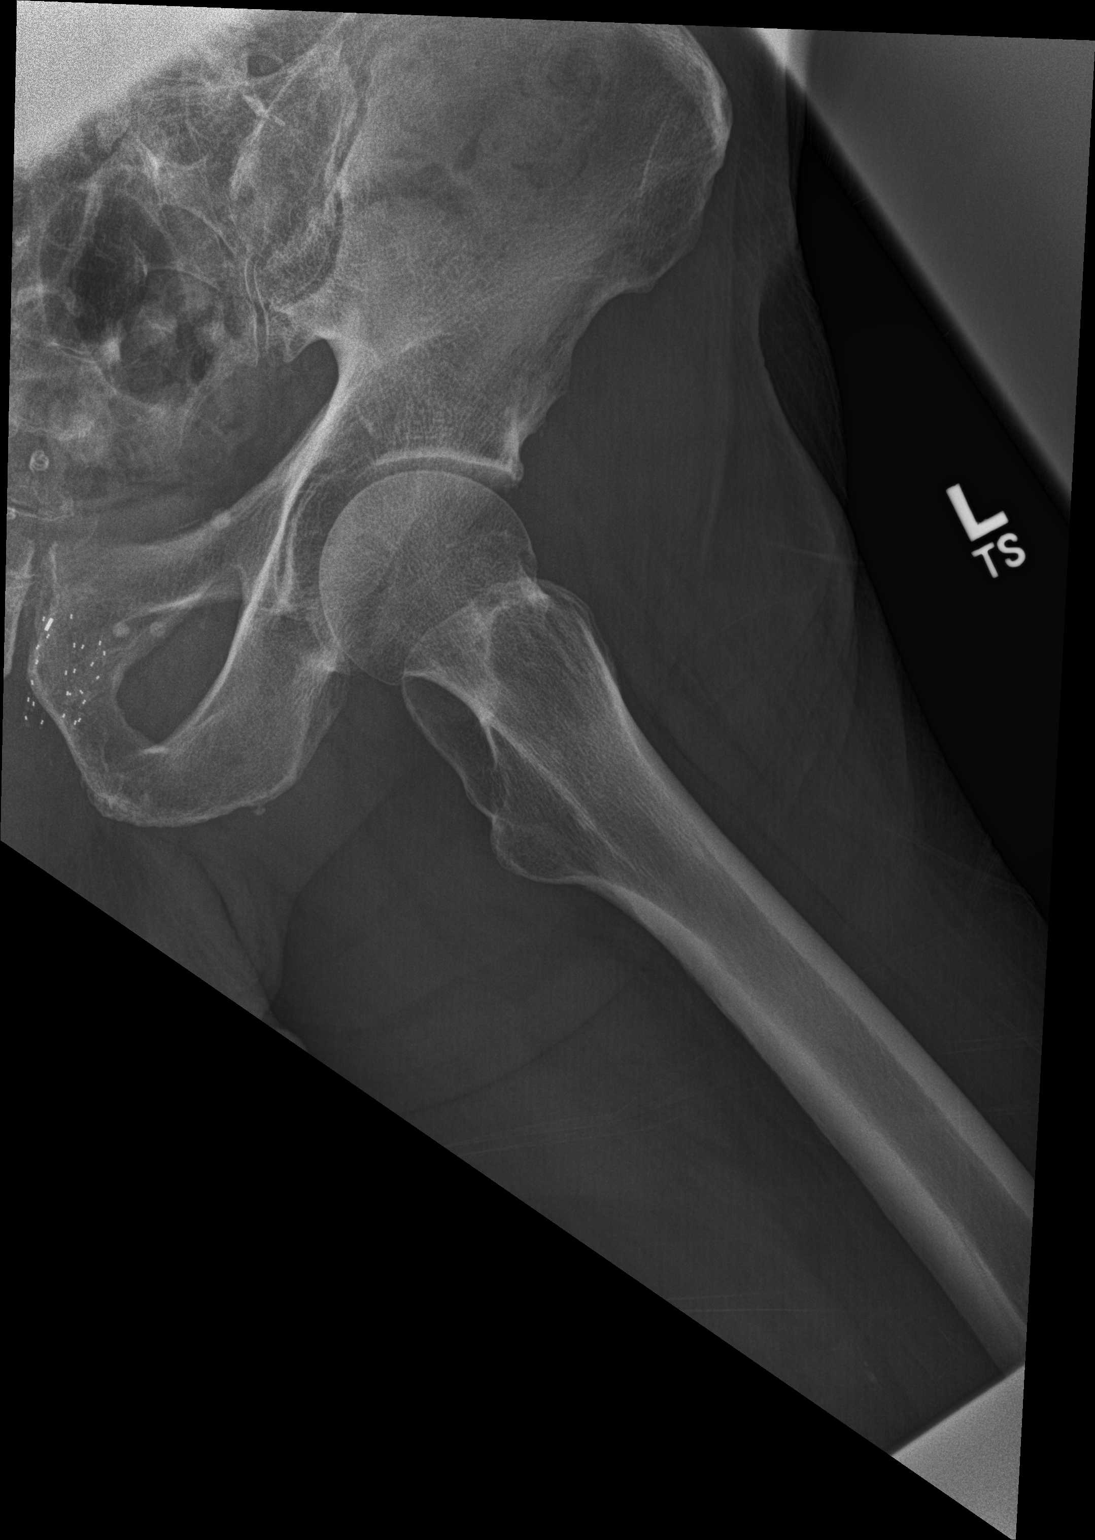

[3 of 3 positions shown; findings below may reference images not displayed]

FINDINGS: The bones are subjectively adequately mineralized. There is no lytic
nor blastic lesion. The observed portions of the sacrum are normal.
There are seed implants in the prostate bed. There are metallic
coils from previous right inguinal hernia repair.

AP and lateral views of the left hip reveal mild symmetric narrowing
of the joint space. The articular surface of the femoral head and
acetabulum remains smoothly rounded. The femoral neck,
intertrochanteric, and subtrochanteric regions are normal.
IMPRESSION: There is no acute bony abnormality of the pelvis or left hip. There
is mild degenerative joint space loss of the left hip which is
similar to that seen on the right. There degenerative changes of the
lower lumbar spine with evidence of previous surgery.

## 2017-12-03 ENCOUNTER — Telehealth: Payer: Self-pay | Admitting: Family Medicine

## 2017-12-03 NOTE — Telephone Encounter (Signed)
Amy with [facility] wanted to let us know that PT and in home nursing assistance was order via hospital but pt refused in home nurse and is only allowing PT. Thanks TNP

## 2017-12-04 DIAGNOSIS — R55 Syncope and collapse: Secondary | ICD-10-CM | POA: Diagnosis not present

## 2017-12-04 DIAGNOSIS — M109 Gout, unspecified: Secondary | ICD-10-CM | POA: Diagnosis not present

## 2017-12-04 DIAGNOSIS — K922 Gastrointestinal hemorrhage, unspecified: Secondary | ICD-10-CM | POA: Diagnosis not present

## 2017-12-04 DIAGNOSIS — D539 Nutritional anemia, unspecified: Secondary | ICD-10-CM | POA: Diagnosis not present

## 2017-12-04 DIAGNOSIS — M1990 Unspecified osteoarthritis, unspecified site: Secondary | ICD-10-CM | POA: Diagnosis not present

## 2017-12-04 DIAGNOSIS — I129 Hypertensive chronic kidney disease with stage 1 through stage 4 chronic kidney disease, or unspecified chronic kidney disease: Secondary | ICD-10-CM | POA: Diagnosis not present

## 2017-12-04 DIAGNOSIS — E039 Hypothyroidism, unspecified: Secondary | ICD-10-CM | POA: Diagnosis not present

## 2017-12-04 DIAGNOSIS — M545 Low back pain: Secondary | ICD-10-CM | POA: Diagnosis not present

## 2017-12-04 DIAGNOSIS — N183 Chronic kidney disease, stage 3 (moderate): Secondary | ICD-10-CM | POA: Diagnosis not present

## 2017-12-04 NOTE — Telephone Encounter (Signed)
Proceed with PT 2 times a week for 4 weeks.

## 2017-12-04 NOTE — Telephone Encounter (Signed)
Glenn Jarvis PT with [facility] want verbal order to do in home PT 2 times a week for 4 weeks.  Her call back is [phone]  Thanks teri

## 2017-12-08 ENCOUNTER — Inpatient Hospital Stay: Payer: Medicare HMO | Admitting: Family Medicine

## 2017-12-08 NOTE — Telephone Encounter (Signed)
Verbal order given to [facility] with [place].

## 2017-12-09 DIAGNOSIS — D539 Nutritional anemia, unspecified: Secondary | ICD-10-CM | POA: Diagnosis not present

## 2017-12-09 DIAGNOSIS — R55 Syncope and collapse: Secondary | ICD-10-CM | POA: Diagnosis not present

## 2017-12-09 DIAGNOSIS — E039 Hypothyroidism, unspecified: Secondary | ICD-10-CM | POA: Diagnosis not present

## 2017-12-09 DIAGNOSIS — M1990 Unspecified osteoarthritis, unspecified site: Secondary | ICD-10-CM | POA: Diagnosis not present

## 2017-12-09 DIAGNOSIS — M545 Low back pain: Secondary | ICD-10-CM | POA: Diagnosis not present

## 2017-12-09 DIAGNOSIS — N183 Chronic kidney disease, stage 3 (moderate): Secondary | ICD-10-CM | POA: Diagnosis not present

## 2017-12-09 DIAGNOSIS — K922 Gastrointestinal hemorrhage, unspecified: Secondary | ICD-10-CM | POA: Diagnosis not present

## 2017-12-09 DIAGNOSIS — I129 Hypertensive chronic kidney disease with stage 1 through stage 4 chronic kidney disease, or unspecified chronic kidney disease: Secondary | ICD-10-CM | POA: Diagnosis not present

## 2017-12-09 DIAGNOSIS — M109 Gout, unspecified: Secondary | ICD-10-CM | POA: Diagnosis not present

## 2017-12-11 DIAGNOSIS — D539 Nutritional anemia, unspecified: Secondary | ICD-10-CM | POA: Diagnosis not present

## 2017-12-11 DIAGNOSIS — M1990 Unspecified osteoarthritis, unspecified site: Secondary | ICD-10-CM | POA: Diagnosis not present

## 2017-12-11 DIAGNOSIS — M545 Low back pain: Secondary | ICD-10-CM | POA: Diagnosis not present

## 2017-12-11 DIAGNOSIS — I129 Hypertensive chronic kidney disease with stage 1 through stage 4 chronic kidney disease, or unspecified chronic kidney disease: Secondary | ICD-10-CM | POA: Diagnosis not present

## 2017-12-11 DIAGNOSIS — R55 Syncope and collapse: Secondary | ICD-10-CM | POA: Diagnosis not present

## 2017-12-11 DIAGNOSIS — N183 Chronic kidney disease, stage 3 (moderate): Secondary | ICD-10-CM | POA: Diagnosis not present

## 2017-12-11 DIAGNOSIS — M109 Gout, unspecified: Secondary | ICD-10-CM | POA: Diagnosis not present

## 2017-12-11 DIAGNOSIS — E039 Hypothyroidism, unspecified: Secondary | ICD-10-CM | POA: Diagnosis not present

## 2017-12-11 DIAGNOSIS — K922 Gastrointestinal hemorrhage, unspecified: Secondary | ICD-10-CM | POA: Diagnosis not present

## 2017-12-14 DIAGNOSIS — M1990 Unspecified osteoarthritis, unspecified site: Secondary | ICD-10-CM | POA: Diagnosis not present

## 2017-12-14 DIAGNOSIS — E039 Hypothyroidism, unspecified: Secondary | ICD-10-CM | POA: Diagnosis not present

## 2017-12-14 DIAGNOSIS — N183 Chronic kidney disease, stage 3 (moderate): Secondary | ICD-10-CM | POA: Diagnosis not present

## 2017-12-14 DIAGNOSIS — R55 Syncope and collapse: Secondary | ICD-10-CM | POA: Diagnosis not present

## 2017-12-14 DIAGNOSIS — M545 Low back pain: Secondary | ICD-10-CM | POA: Diagnosis not present

## 2017-12-14 DIAGNOSIS — I129 Hypertensive chronic kidney disease with stage 1 through stage 4 chronic kidney disease, or unspecified chronic kidney disease: Secondary | ICD-10-CM | POA: Diagnosis not present

## 2017-12-14 DIAGNOSIS — K922 Gastrointestinal hemorrhage, unspecified: Secondary | ICD-10-CM | POA: Diagnosis not present

## 2017-12-14 DIAGNOSIS — M109 Gout, unspecified: Secondary | ICD-10-CM | POA: Diagnosis not present

## 2017-12-14 DIAGNOSIS — D539 Nutritional anemia, unspecified: Secondary | ICD-10-CM | POA: Diagnosis not present

## 2017-12-16 DIAGNOSIS — K922 Gastrointestinal hemorrhage, unspecified: Secondary | ICD-10-CM | POA: Diagnosis not present

## 2017-12-16 DIAGNOSIS — D539 Nutritional anemia, unspecified: Secondary | ICD-10-CM | POA: Diagnosis not present

## 2017-12-16 DIAGNOSIS — R55 Syncope and collapse: Secondary | ICD-10-CM | POA: Diagnosis not present

## 2017-12-16 DIAGNOSIS — M109 Gout, unspecified: Secondary | ICD-10-CM | POA: Diagnosis not present

## 2017-12-16 DIAGNOSIS — M1990 Unspecified osteoarthritis, unspecified site: Secondary | ICD-10-CM | POA: Diagnosis not present

## 2017-12-16 DIAGNOSIS — I129 Hypertensive chronic kidney disease with stage 1 through stage 4 chronic kidney disease, or unspecified chronic kidney disease: Secondary | ICD-10-CM | POA: Diagnosis not present

## 2017-12-16 DIAGNOSIS — M545 Low back pain: Secondary | ICD-10-CM | POA: Diagnosis not present

## 2017-12-16 DIAGNOSIS — N183 Chronic kidney disease, stage 3 (moderate): Secondary | ICD-10-CM | POA: Diagnosis not present

## 2017-12-16 DIAGNOSIS — E039 Hypothyroidism, unspecified: Secondary | ICD-10-CM | POA: Diagnosis not present

## 2017-12-24 DIAGNOSIS — D539 Nutritional anemia, unspecified: Secondary | ICD-10-CM | POA: Diagnosis not present

## 2017-12-24 DIAGNOSIS — R55 Syncope and collapse: Secondary | ICD-10-CM | POA: Diagnosis not present

## 2017-12-24 DIAGNOSIS — M545 Low back pain: Secondary | ICD-10-CM | POA: Diagnosis not present

## 2017-12-24 DIAGNOSIS — K922 Gastrointestinal hemorrhage, unspecified: Secondary | ICD-10-CM | POA: Diagnosis not present

## 2017-12-24 DIAGNOSIS — N183 Chronic kidney disease, stage 3 (moderate): Secondary | ICD-10-CM | POA: Diagnosis not present

## 2017-12-24 DIAGNOSIS — E039 Hypothyroidism, unspecified: Secondary | ICD-10-CM | POA: Diagnosis not present

## 2017-12-24 DIAGNOSIS — I129 Hypertensive chronic kidney disease with stage 1 through stage 4 chronic kidney disease, or unspecified chronic kidney disease: Secondary | ICD-10-CM | POA: Diagnosis not present

## 2017-12-24 DIAGNOSIS — M1990 Unspecified osteoarthritis, unspecified site: Secondary | ICD-10-CM | POA: Diagnosis not present

## 2017-12-24 DIAGNOSIS — M109 Gout, unspecified: Secondary | ICD-10-CM | POA: Diagnosis not present

## 2017-12-25 DIAGNOSIS — I129 Hypertensive chronic kidney disease with stage 1 through stage 4 chronic kidney disease, or unspecified chronic kidney disease: Secondary | ICD-10-CM | POA: Diagnosis not present

## 2017-12-25 DIAGNOSIS — N183 Chronic kidney disease, stage 3 (moderate): Secondary | ICD-10-CM | POA: Diagnosis not present

## 2017-12-25 DIAGNOSIS — E039 Hypothyroidism, unspecified: Secondary | ICD-10-CM | POA: Diagnosis not present

## 2017-12-25 DIAGNOSIS — M1990 Unspecified osteoarthritis, unspecified site: Secondary | ICD-10-CM | POA: Diagnosis not present

## 2017-12-25 DIAGNOSIS — K922 Gastrointestinal hemorrhage, unspecified: Secondary | ICD-10-CM | POA: Diagnosis not present

## 2017-12-25 DIAGNOSIS — R55 Syncope and collapse: Secondary | ICD-10-CM | POA: Diagnosis not present

## 2017-12-25 DIAGNOSIS — M109 Gout, unspecified: Secondary | ICD-10-CM | POA: Diagnosis not present

## 2017-12-25 DIAGNOSIS — D539 Nutritional anemia, unspecified: Secondary | ICD-10-CM | POA: Diagnosis not present

## 2017-12-25 DIAGNOSIS — M545 Low back pain: Secondary | ICD-10-CM | POA: Diagnosis not present

## 2017-12-28 DIAGNOSIS — I129 Hypertensive chronic kidney disease with stage 1 through stage 4 chronic kidney disease, or unspecified chronic kidney disease: Secondary | ICD-10-CM | POA: Diagnosis not present

## 2017-12-28 DIAGNOSIS — K922 Gastrointestinal hemorrhage, unspecified: Secondary | ICD-10-CM | POA: Diagnosis not present

## 2017-12-28 DIAGNOSIS — R55 Syncope and collapse: Secondary | ICD-10-CM | POA: Diagnosis not present

## 2017-12-28 DIAGNOSIS — M1990 Unspecified osteoarthritis, unspecified site: Secondary | ICD-10-CM | POA: Diagnosis not present

## 2017-12-28 DIAGNOSIS — M545 Low back pain: Secondary | ICD-10-CM | POA: Diagnosis not present

## 2017-12-28 DIAGNOSIS — D539 Nutritional anemia, unspecified: Secondary | ICD-10-CM | POA: Diagnosis not present

## 2017-12-28 DIAGNOSIS — E039 Hypothyroidism, unspecified: Secondary | ICD-10-CM | POA: Diagnosis not present

## 2017-12-28 DIAGNOSIS — N183 Chronic kidney disease, stage 3 (moderate): Secondary | ICD-10-CM | POA: Diagnosis not present

## 2017-12-28 DIAGNOSIS — M109 Gout, unspecified: Secondary | ICD-10-CM | POA: Diagnosis not present

## 2017-12-30 DIAGNOSIS — M109 Gout, unspecified: Secondary | ICD-10-CM | POA: Diagnosis not present

## 2017-12-30 DIAGNOSIS — N183 Chronic kidney disease, stage 3 (moderate): Secondary | ICD-10-CM | POA: Diagnosis not present

## 2017-12-30 DIAGNOSIS — M545 Low back pain: Secondary | ICD-10-CM | POA: Diagnosis not present

## 2017-12-30 DIAGNOSIS — E039 Hypothyroidism, unspecified: Secondary | ICD-10-CM | POA: Diagnosis not present

## 2017-12-30 DIAGNOSIS — K922 Gastrointestinal hemorrhage, unspecified: Secondary | ICD-10-CM | POA: Diagnosis not present

## 2017-12-30 DIAGNOSIS — D539 Nutritional anemia, unspecified: Secondary | ICD-10-CM | POA: Diagnosis not present

## 2017-12-30 DIAGNOSIS — I129 Hypertensive chronic kidney disease with stage 1 through stage 4 chronic kidney disease, or unspecified chronic kidney disease: Secondary | ICD-10-CM | POA: Diagnosis not present

## 2017-12-30 DIAGNOSIS — M1990 Unspecified osteoarthritis, unspecified site: Secondary | ICD-10-CM | POA: Diagnosis not present

## 2017-12-30 DIAGNOSIS — R55 Syncope and collapse: Secondary | ICD-10-CM | POA: Diagnosis not present

## 2017-12-31 ENCOUNTER — Ambulatory Visit: Payer: Medicare HMO | Admitting: Family Medicine

## 2017-12-31 ENCOUNTER — Encounter: Payer: Self-pay | Admitting: Family Medicine

## 2017-12-31 ENCOUNTER — Other Ambulatory Visit: Payer: Self-pay | Admitting: Family Medicine

## 2017-12-31 VITALS — BP 148/78 | HR 71 | Temp 98.0°F

## 2017-12-31 DIAGNOSIS — N183 Chronic kidney disease, stage 3 unspecified: Secondary | ICD-10-CM

## 2017-12-31 DIAGNOSIS — R2689 Other abnormalities of gait and mobility: Secondary | ICD-10-CM | POA: Diagnosis not present

## 2017-12-31 DIAGNOSIS — M109 Gout, unspecified: Secondary | ICD-10-CM | POA: Diagnosis not present

## 2017-12-31 DIAGNOSIS — R609 Edema, unspecified: Secondary | ICD-10-CM

## 2017-12-31 MED ORDER — PREDNISONE 10 MG PO TABS: 10.0000 mg | ORAL_TABLET | Freq: Every day | ORAL | 0 refills | Status: DC

## 2017-12-31 MED ORDER — GABAPENTIN 100 MG PO CAPS: 100.0000 mg | ORAL_CAPSULE | Freq: Three times a day (TID) | ORAL | 3 refills | Status: DC

## 2017-12-31 MED ORDER — TRAMADOL HCL 50 MG PO TABS: 50.0000 mg | ORAL_TABLET | Freq: Three times a day (TID) | ORAL | 2 refills | Status: DC | PRN

## 2017-12-31 MED ORDER — PANTOPRAZOLE SODIUM 40 MG PO TBEC: 40.0000 mg | DELAYED_RELEASE_TABLET | Freq: Every day | ORAL | 3 refills | Status: DC

## 2017-12-31 MED ORDER — COLCHICINE 0.6 MG PO TABS: 0.6000 mg | ORAL_TABLET | Freq: Two times a day (BID) | ORAL | 3 refills | Status: DC

## 2017-12-31 MED ORDER — FUROSEMIDE 20 MG PO TABS: ORAL_TABLET | ORAL | 3 refills | Status: DC

## 2017-12-31 NOTE — Progress Notes (Signed)
Patient: Glenn Mcpartland Sr. Male    DOB: 28-Aug-1927   84 y.o.   MRN: [id number] Visit Date: 12/31/2017  Today's Provider: Vernie Murders, PA   Chief Complaint  Patient presents with  . Hypertension  . Follow-up   Subjective:    HPI   Hypertension, follow-up:  BP Readings from Last 3 Encounters:  12/31/17 (!) 148/78  11/27/17 138/80  10/22/17 (!) 190/90   Hewas last seen for hypertension 4 monthsago.  BP at that visit was 168/84 Management changes since that visit include advised patient to take medication regularly. Patient was admitted to [facility] on 11/23/17 and discharged on 11/27/17 for syncope and collapse. They discontinued Amlodipine.  Hereports good compliance with treatment. Heis nothaving side effects.  Heis notexercising regularly Heis notadherent to low salt diet.  Outside blood pressures are being checked. Heis experiencing left foot pain. Patient denies chest pain, chest pressure/discomfort, irregular heart beat and palpitations.  Cardiovascular risk factors include advanced age (older than 25 for men, 40 for women), dyslipidemia, hypertension and male gender.  Use of agents associated with hypertension: none.    Weight trend: stable Wt Readings from Last 3 Encounters:  11/27/17 158 lb (71.7 kg)  10/22/17 163 lb (73.9 kg)  08/27/17 163 lb 6.4 oz (74.1 kg)    Current diet: has a little more appetite now  Past Medical History:  Diagnosis Date  . Arthritis   . GERD (gastroesophageal reflux disease)   . Hemorrhoid 1986  . Hyperlipidemia   . Hypertension   . Thyroid disease   . Vertigo    Past Surgical History:  Procedure Laterality Date  . APPENDECTOMY  1968  . BACK SURGERY    . COLONOSCOPY N/A 11/27/2017   Procedure: COLONOSCOPY;  Surgeon: Toledo, Benay Pike, MD;  Location: [facility] ENDOSCOPY;  Service: Gastroenterology;  Laterality: N/A;  . ESOPHAGOGASTRODUODENOSCOPY (EGD) WITH PROPOFOL N/A 11/25/2017   Procedure:  ESOPHAGOGASTRODUODENOSCOPY (EGD) WITH PROPOFOL;  Surgeon: Toledo, Benay Pike, MD;  Location: [facility] ENDOSCOPY;  Service: Gastroenterology;  Laterality: N/A;  . EXCISIONAL HEMORRHOIDECTOMY    . INGUINAL HERNIA REPAIR Right 03/17/2016   Procedure: LAPAROSCOPIC INGUINAL HERNIA;  Surgeon: Florene Glen, MD;  Location: [facility] ORS;  Service: General;  Laterality: Right;  . SPINE SURGERY  1984   Family History  Problem Relation Age of Onset  . Alzheimer's disease Father   . Healthy Sister    Allergies  Allergen Reactions  . Codeine   . Cortizone-10  [Hydrocortisone]     Current Outpatient Medications:  .  colchicine 0.6 MG tablet, Take 0.6 mg by mouth 2 (two) times daily., Disp: , Rfl:  .  furosemide (LASIX) 20 MG tablet, TAKE 1 TABLET BY MOUTH ONCE DAILY FOR EDEMA/BP, Disp: 90 tablet, Rfl: 3 .  gabapentin (NEURONTIN) 100 MG capsule, TAKE 1 CAPSULE BY MOUTH 3 TIMES DAILY, Disp: 90 capsule, Rfl: 0 .  hydrALAZINE (APRESOLINE) 25 MG tablet, Take 25 mg by mouth 3 (three) times daily., Disp: , Rfl:  .  levothyroxine (SYNTHROID, LEVOTHROID) 50 MCG tablet, TAKE 1 TABLET BY MOUTH ONCE DAILY FOR THYROID, ON AN EMPTY STOMACH. WAIT 30 MINUTES BEFORE TAKING OTHER MEDS., Disp: 90 tablet, Rfl: 3 .  losartan (COZAAR) 100 MG tablet, TAKE 1 TABLET BY MOUTH ONCE DAILY FOR BLOOD PRESSURE (HIGH), Disp: 90 tablet, Rfl: 3 .  metoprolol tartrate (LOPRESSOR) 25 MG tablet, Take 0.5 tablets (12.5 mg total) by mouth 2 (two) times daily., Disp: 90 tablet, Rfl: 3 .  pantoprazole (  PROTONIX) 40 MG tablet, Take 1 tablet (40 mg total) by mouth daily., Disp: 30 tablet, Rfl: 0 .  traMADol (ULTRAM) 50 MG tablet, Take 1 tablet (50 mg total) by mouth every 8 (eight) hours as needed., Disp: 30 tablet, Rfl: 0 .  triamcinolone (KENALOG) 0.025 % cream, TRIAMCINOLONE ACETONIDE, 0.025% (External Cream)  1 (one) Cream Cream apply daily as needed for 0 days  Quantity: 60;  Refills: 0   Ordered :13-Apr-2014  [name] ;  Started  13-Apr-2014 Active Comments: Medication taken as needed. , Disp: , Rfl:  .  ranitidine (ZANTAC) 150 MG capsule, Take 1 capsule (150 mg total) by mouth 2 (two) times daily. (Patient not taking: Reported on 10/22/2017), Disp: 60 capsule, Rfl: 3  Review of Systems  Social History   Tobacco Use  . Smoking status: Former Smoker    Packs/day: 2.00    Years: 25.00    Pack years: 50.00    Last attempt to quit: 12/28/1965    Years since quitting: 52.0  . Smokeless tobacco: Never Used  Substance Use Topics  . Alcohol use: No    Frequency: Never    Comment: 1 beer occasionally   Objective:   BP (!) 148/78 (BP Location: Right Arm, Patient Position: Sitting, Cuff Size: Normal)   Pulse 71   Temp 98 F (36.7 C) (Oral)   SpO2 96%    Physical Exam  Constitutional: He is oriented to person, place, and time. He appears well-developed and well-nourished. No distress.  HENT:  Head: Normocephalic and atraumatic.  Right Ear: Hearing normal.  Left Ear: Hearing normal.  Nose: Nose normal.  Eyes: Conjunctivae and lids are normal. Right eye exhibits no discharge. Left eye exhibits no discharge. No scleral icterus.  Neck: Neck supple.  Cardiovascular: Normal rate and regular rhythm.  Pulmonary/Chest: Effort normal and breath sounds normal. No respiratory distress.  Abdominal: Soft. Bowel sounds are normal.  Musculoskeletal: He exhibits tenderness.  Severe pain in left foot with warmth to touch and some redness. Some ankle swelling and enlargement of the first tarsal-metatarsal joint. Slight puffiness of toes. No open sores.  Neurological: He is alert and oriented to person, place, and time.  Skin: Skin is intact. No lesion and no rash noted.  Psychiatric: He has a normal mood and affect. His speech is normal and behavior is normal. Thought content normal.      Assessment & Plan:     1. Gout, unspecified cause, unspecified chronicity, unspecified site Onset of an acute flare in the left foot  over the past 2-3 days. Unable to stand or walk due to pain. Will refill Colchicine and give prednisone taper. Must assess renal function to be sure he can continue Colchicine. May need recheck with nephrologist. Follow up pending lab reports. - CBC with Differential/Platelet - Uric acid - Comprehensive metabolic panel  2. Chronic kidney disease (CKD), stage III (moderate) (HCC) Need labs to assess renal function and need for follow up with nephrologist (Dr. Holley Raring). - CBC with Differential/Platelet - Uric acid - Comprehensive metabolic panel  3. Balance problem Was better with physical therapy at home until gout flared. Will proceed with therapy assessment for need, help with fall prevention and strengthening.       Vernie Murders, PA  [facility]

## 2017-12-31 NOTE — Telephone Encounter (Signed)
[facility] faxed a refill request on the following medications. Thanks CC  pantoprazole (PROTONIX) 40 MG tablet   gabapentin (NEURONTIN) 100 MG capsule   traMADol (ULTRAM) 50 MG tablet   furosemide (LASIX) 20 MG tablet

## 2018-01-01 ENCOUNTER — Other Ambulatory Visit: Payer: Self-pay

## 2018-01-01 ENCOUNTER — Telehealth: Payer: Self-pay | Admitting: Emergency Medicine

## 2018-01-01 LAB — CBC WITH DIFFERENTIAL/PLATELET
BASOS: 0 %
Basophils Absolute: 0 10*3/uL (ref 0.0–0.2)
EOS (ABSOLUTE): 0 10*3/uL (ref 0.0–0.4)
EOS: 0 %
HEMATOCRIT: 28.3 % — AB (ref 37.5–51.0)
Hemoglobin: 9.5 g/dL — ABNORMAL LOW (ref 13.0–17.7)
IMMATURE GRANS (ABS): 0 10*3/uL (ref 0.0–0.1)
IMMATURE GRANULOCYTES: 0 %
LYMPHS: 8 %
Lymphocytes Absolute: 0.7 10*3/uL (ref 0.7–3.1)
MCH: 27.8 pg (ref 26.6–33.0)
MCHC: 33.6 g/dL (ref 31.5–35.7)
MCV: 83 fL (ref 79–97)
Monocytes Absolute: 1.1 10*3/uL — ABNORMAL HIGH (ref 0.1–0.9)
Monocytes: 14 %
NEUTROS ABS: 6.6 10*3/uL (ref 1.4–7.0)
NEUTROS PCT: 78 %
PLATELETS: 93 10*3/uL — AB (ref 150–379)
RBC: 3.42 x10E6/uL — ABNORMAL LOW (ref 4.14–5.80)
RDW: 14.2 % (ref 12.3–15.4)
WBC: 8.4 10*3/uL (ref 3.4–10.8)

## 2018-01-01 LAB — COMPREHENSIVE METABOLIC PANEL
ALBUMIN: 3.9 g/dL (ref 3.5–4.7)
ALK PHOS: 97 IU/L (ref 39–117)
ALT: 15 IU/L (ref 0–44)
AST: 21 IU/L (ref 0–40)
Albumin/Globulin Ratio: 1.1 — ABNORMAL LOW (ref 1.2–2.2)
BUN/Creatinine Ratio: 16 (ref 10–24)
BUN: 49 mg/dL — AB (ref 8–27)
Bilirubin Total: 0.8 mg/dL (ref 0.0–1.2)
CALCIUM: 8.9 mg/dL (ref 8.6–10.2)
CO2: 17 mmol/L — ABNORMAL LOW (ref 20–29)
CREATININE: 3.13 mg/dL — AB (ref 0.76–1.27)
Chloride: 100 mmol/L (ref 96–106)
GFR calc Af Amer: 19 mL/min/{1.73_m2} — ABNORMAL LOW (ref >59)
GFR, EST NON AFRICAN AMERICAN: 17 mL/min/{1.73_m2} — AB (ref >59)
GLOBULIN, TOTAL: 3.5 g/dL (ref 1.5–4.5)
GLUCOSE: 100 mg/dL — AB (ref 65–99)
Potassium: 5.5 mmol/L — ABNORMAL HIGH (ref 3.5–5.2)
Sodium: 134 mmol/L (ref 134–144)
Total Protein: 7.4 g/dL (ref 6.0–8.5)

## 2018-01-01 LAB — URIC ACID: Uric Acid: 8.9 mg/dL — ABNORMAL HIGH (ref 3.7–8.6)

## 2018-01-01 MED ORDER — PANTOPRAZOLE SODIUM 40 MG PO TBEC: 40.0000 mg | DELAYED_RELEASE_TABLET | Freq: Every day | ORAL | 3 refills | Status: DC

## 2018-01-01 NOTE — Progress Notes (Signed)
Sent Pantoprazole refills to [facility]. Refill was set to print instead of e-scribe.

## 2018-01-01 NOTE — Telephone Encounter (Signed)
LMTCB

## 2018-01-01 NOTE — Telephone Encounter (Signed)
-----   Message from Margo Common, [place] sent at 01/01/2018  8:42 AM EST ----- Hgb and platelets have dropped low again. Uric acid level is high and kidney function is worse. Need to go back to Dr. Holley Raring (nephrologist) for chronic kidney failure, recheck with gastroenterologist to be sure laser treatment to colon is holding and he may recommend evaluation by a hematologist.

## 2018-01-04 DIAGNOSIS — R55 Syncope and collapse: Secondary | ICD-10-CM | POA: Diagnosis not present

## 2018-01-04 DIAGNOSIS — M545 Low back pain: Secondary | ICD-10-CM | POA: Diagnosis not present

## 2018-01-04 DIAGNOSIS — D539 Nutritional anemia, unspecified: Secondary | ICD-10-CM | POA: Diagnosis not present

## 2018-01-04 DIAGNOSIS — M109 Gout, unspecified: Secondary | ICD-10-CM | POA: Diagnosis not present

## 2018-01-04 DIAGNOSIS — I129 Hypertensive chronic kidney disease with stage 1 through stage 4 chronic kidney disease, or unspecified chronic kidney disease: Secondary | ICD-10-CM | POA: Diagnosis not present

## 2018-01-04 DIAGNOSIS — K922 Gastrointestinal hemorrhage, unspecified: Secondary | ICD-10-CM | POA: Diagnosis not present

## 2018-01-04 DIAGNOSIS — E039 Hypothyroidism, unspecified: Secondary | ICD-10-CM | POA: Diagnosis not present

## 2018-01-04 DIAGNOSIS — M1990 Unspecified osteoarthritis, unspecified site: Secondary | ICD-10-CM | POA: Diagnosis not present

## 2018-01-04 DIAGNOSIS — N183 Chronic kidney disease, stage 3 (moderate): Secondary | ICD-10-CM | POA: Diagnosis not present

## 2018-01-05 NOTE — Telephone Encounter (Signed)
LMTCB

## 2018-01-05 NOTE — Telephone Encounter (Signed)
Advised patient's daughter [name] of lab results and provided Dr. Holley Raring and Dr. Georgeann Oppenheim contact number to schedule follow up appointments.

## 2018-01-21 ENCOUNTER — Encounter: Payer: Self-pay | Admitting: Gastroenterology

## 2018-01-21 ENCOUNTER — Other Ambulatory Visit
Admission: RE | Admit: 2018-01-21 | Discharge: 2018-01-21 | Disposition: A | Discharge status: Home / Self Care | Payer: Medicare HMO | Source: Ambulatory Visit | Attending: Gastroenterology | Admitting: Gastroenterology

## 2018-01-21 ENCOUNTER — Ambulatory Visit: Payer: Medicare HMO | Admitting: Gastroenterology

## 2018-01-21 VITALS — BP 118/70 | HR 57 | Temp 97.9°F | Wt 148.8 lb

## 2018-01-21 DIAGNOSIS — D649 Anemia, unspecified: Secondary | ICD-10-CM | POA: Insufficient documentation

## 2018-01-21 DIAGNOSIS — R140 Abdominal distension (gaseous): Secondary | ICD-10-CM

## 2018-01-21 DIAGNOSIS — R197 Diarrhea, unspecified: Secondary | ICD-10-CM

## 2018-01-21 LAB — IRON AND TIBC
Iron: 112 ug/dL (ref 45–182)
SATURATION RATIOS: 38 % (ref 17.9–39.5)
TIBC: 294 ug/dL (ref 250–450)
UIBC: 182 ug/dL

## 2018-01-21 LAB — URINALYSIS, COMPLETE (UACMP) WITH MICROSCOPIC
BACTERIA UA: NONE SEEN
Bilirubin Urine: NEGATIVE
Glucose, UA: NEGATIVE mg/dL
Hgb urine dipstick: NEGATIVE
KETONES UR: NEGATIVE mg/dL
LEUKOCYTES UA: NEGATIVE
Nitrite: NEGATIVE
PH: 5 (ref 5.0–8.0)
PROTEIN: NEGATIVE mg/dL
SQUAMOUS EPITHELIAL / LPF: NONE SEEN
Specific Gravity, Urine: 1.006 (ref 1.005–1.030)

## 2018-01-21 LAB — FOLATE: Folate: 24 ng/mL (ref >5.9)

## 2018-01-21 LAB — FERRITIN: FERRITIN: 86 ng/mL (ref 24–336)

## 2018-01-21 LAB — VITAMIN B12: VITAMIN B 12: 475 pg/mL (ref 180–914)

## 2018-01-21 NOTE — Progress Notes (Signed)
Jonathon Bellows MD, MRCP(U.K) [address]  [place]  [place], [place] [address]  Main: [phone]  Fax: [phone]   Primary Care Physician: Margo Common, [place]  Primary Gastroenterologist:  Dr. Jonathon Bellows   No chief complaint on file.   HPI: Glenn Sandt Sr. is a 84 y.o. male. He is here today for a hospital follow up when he was seen back in 10/2017 by Dr Alice Reichert when he was admitted with rectal bleeding and anemia. On admission HB was low at 9.5 grams . Denies any overt blood loss. He had an EGD and was negative except for mild esophagitis.Colonoscopy showed 3 AVM's which were ablated with APC.    Labs 3 weeks back: Hb 9.5 grams with MCV of 83. Feels weak , When he eats has diarrhea. Has had diarrhea for a while.   Complains of a lot of heartburn. He used to be on protonix but none now.     Current Outpatient Medications  Medication Sig Dispense Refill  . colchicine 0.6 MG tablet Take 1 tablet (0.6 mg total) by mouth 2 (two) times daily. 60 tablet 3  . furosemide (LASIX) 20 MG tablet TAKE 1 TABLET BY MOUTH ONCE DAILY FOR EDEMA/BP 90 tablet 3  . gabapentin (NEURONTIN) 100 MG capsule Take 1 capsule (100 mg total) by mouth 3 (three) times daily. 90 capsule 3  . hydrALAZINE (APRESOLINE) 25 MG tablet Take 25 mg by mouth 3 (three) times daily.    Marland Kitchen levothyroxine (SYNTHROID, LEVOTHROID) 50 MCG tablet TAKE 1 TABLET BY MOUTH ONCE DAILY FOR THYROID, ON AN EMPTY STOMACH. WAIT 30 MINUTES BEFORE TAKING OTHER MEDS. 90 tablet 3  . losartan (COZAAR) 100 MG tablet TAKE 1 TABLET BY MOUTH ONCE DAILY FOR BLOOD PRESSURE (HIGH) 90 tablet 3  . metoprolol tartrate (LOPRESSOR) 25 MG tablet Take 0.5 tablets (12.5 mg total) by mouth 2 (two) times daily. 90 tablet 3  . pantoprazole (PROTONIX) 40 MG tablet Take 1 tablet (40 mg total) by mouth daily. 90 tablet 3  . predniSONE (DELTASONE) 10 MG tablet Take 1 tablet (10 mg total) by mouth daily with breakfast. Taper dose by one tablet daily  (6,5,4,3,2,1) 21 tablet 0  . ranitidine (ZANTAC) 150 MG capsule Take 1 capsule (150 mg total) by mouth 2 (two) times daily. (Patient not taking: Reported on 10/22/2017) 60 capsule 3  . traMADol (ULTRAM) 50 MG tablet Take 1 tablet (50 mg total) by mouth every 8 (eight) hours as needed. 30 tablet 2  . triamcinolone (KENALOG) 0.025 % cream TRIAMCINOLONE ACETONIDE, 0.025% (External Cream)  1 (one) Cream Cream apply daily as needed for 0 days  Quantity: 60;  Refills: 0   Ordered :13-Apr-2014  [name] ;  Started 13-Apr-2014 Active Comments: Medication taken as needed.      No current facility-administered medications for this visit.     Allergies as of 01/21/2018 - Review Complete 12/31/2017  Allergen Reaction Noted  . Codeine  07/05/2015  . Cortizone-10  [hydrocortisone]  07/05/2015    ROS:  General: Negative for anorexia, weight loss, fever, chills, fatigue, weakness. ENT: Negative for hoarseness, difficulty swallowing , nasal congestion. CV: Negative for chest pain, angina, palpitations, dyspnea on exertion, peripheral edema.  Respiratory: Negative for dyspnea at rest, dyspnea on exertion, cough, sputum, wheezing.  GI: See history of present illness. GU:  Negative for dysuria, hematuria, urinary incontinence, urinary frequency, nocturnal urination.  Endo: Negative for unusual weight change.    Physical Examination:   There  were no vitals taken for this visit.  General: Well-nourished, well-developed in no acute distress.  Eyes: No icterus. Conjunctivae pink. Mouth: Oropharyngeal mucosa moist and pink , no lesions erythema or exudate. Lungs: Clear to auscultation bilaterally. Non-labored. Heart: Regular rate and rhythm, no murmurs rubs or gallops.  Abdomen: Bowel sounds are normal, nontender, nondistended, no hepatosplenomegaly or masses, no abdominal bruits or hernia , no rebound or guarding.   Extremities: No lower extremity edema. No clubbing or deformities. Neuro:  Alert and oriented x 3.  Grossly intact. Skin: Warm and dry, no jaundice.   Psych: Alert and cooperative, normal mood and affect.   Imaging Studies: No results found.  Assessment and Plan:   Glenn Jarvis. is a 84 y.o. y/o male here for a hospital follow up . He has CKD and was admitted wit rectal bleeding recently and symptomatic microcytic anemia . 3 AVM's were found in the colon which were ablated. Explained CKD is associated with small bowel AVM's too. He presently feels tired and has some gas ,bloating and diarrhea   Plan  1. Iron studies , b12 ,folate, check celiac serology, urine analysis 2. Protonix  3. Trial of activated charcoal tablets for gas and bloating  4. Stool for C diff and PCR 5. If iron levels are low will need IV iron  6. At next visit we will decide if he wishes to pursue small bowel capsule as he can have AVM's in the small bowel too. We did discuss today and the main issue is how much he would like to pursue in terms of investigation at his age. He will think about it . He also says there has been a discussion about dialysis which he is not keen on .     Dr Jonathon Bellows  MD,MRCP [facility]) Follow up in 3 weeks

## 2018-01-22 ENCOUNTER — Other Ambulatory Visit
Admission: RE | Admit: 2018-01-22 | Discharge: 2018-01-22 | Disposition: A | Discharge status: Home / Self Care | Payer: Medicare HMO | Source: Ambulatory Visit | Attending: Gastroenterology | Admitting: Gastroenterology

## 2018-01-22 DIAGNOSIS — R12 Heartburn: Secondary | ICD-10-CM | POA: Diagnosis not present

## 2018-01-22 DIAGNOSIS — R197 Diarrhea, unspecified: Secondary | ICD-10-CM | POA: Diagnosis not present

## 2018-01-22 DIAGNOSIS — N189 Chronic kidney disease, unspecified: Secondary | ICD-10-CM | POA: Diagnosis not present

## 2018-01-22 DIAGNOSIS — Z79899 Other long term (current) drug therapy: Secondary | ICD-10-CM | POA: Diagnosis not present

## 2018-01-22 DIAGNOSIS — K625 Hemorrhage of anus and rectum: Secondary | ICD-10-CM | POA: Insufficient documentation

## 2018-01-22 DIAGNOSIS — Z885 Allergy status to narcotic agent status: Secondary | ICD-10-CM | POA: Insufficient documentation

## 2018-01-22 DIAGNOSIS — D649 Anemia, unspecified: Secondary | ICD-10-CM | POA: Diagnosis not present

## 2018-01-22 DIAGNOSIS — Z79891 Long term (current) use of opiate analgesic: Secondary | ICD-10-CM | POA: Diagnosis not present

## 2018-01-22 DIAGNOSIS — Z7952 Long term (current) use of systemic steroids: Secondary | ICD-10-CM | POA: Diagnosis not present

## 2018-01-22 DIAGNOSIS — Z9109 Other allergy status, other than to drugs and biological substances: Secondary | ICD-10-CM | POA: Insufficient documentation

## 2018-01-22 LAB — C DIFFICILE QUICK SCREEN W PCR REFLEX
C DIFFICILE (CDIFF) TOXIN: NEGATIVE
C DIFFICLE (CDIFF) ANTIGEN: NEGATIVE
C Diff interpretation: NOT DETECTED

## 2018-01-22 LAB — GASTROINTESTINAL PANEL BY PCR, STOOL (REPLACES STOOL CULTURE)

## 2018-01-22 LAB — GLIADIN ANTIBODIES, SERUM
ANTIGLIADIN ABS, IGA: 5 U (ref 0–19)
Gliadin IgG: 3 units (ref 0–19)

## 2018-01-23 LAB — RETICULIN ANTIBODIES, IGA W TITER: Reticulin Ab, IgA: NEGATIVE titer (ref <2.5)

## 2018-01-24 ENCOUNTER — Encounter: Payer: Self-pay | Admitting: Gastroenterology

## 2018-01-24 LAB — TISSUE TRANSGLUTAMINASE, IGA: Tissue Transglutaminase Ab, IgA: 4 U/mL — ABNORMAL HIGH (ref 0–3)

## 2018-01-25 ENCOUNTER — Telehealth: Payer: Self-pay | Admitting: Gastroenterology

## 2018-01-25 ENCOUNTER — Telehealth: Payer: Self-pay

## 2018-01-25 MED ORDER — DEXLANSOPRAZOLE 60 MG PO CPDR: 60.0000 mg | DELAYED_RELEASE_CAPSULE | Freq: Every day | ORAL | 3 refills | Status: DC

## 2018-01-25 NOTE — Telephone Encounter (Signed)
Patient's daughter(Yalonda Wynetta Emery) called about his medication. Please call [phone])

## 2018-01-25 NOTE — Telephone Encounter (Signed)
Advised daughter of results per Dr. Vicente Males.   Daughter states Protonix is not working for the patient and would like to request a different medication be called into the pharmacy.   Advised daughter I would let Dr. Vicente Males know of her request.   Permission granted by daughter to leave Dr. Georgeann Oppenheim response on the voicemail.

## 2018-01-25 NOTE — Telephone Encounter (Signed)
Intially called Glenn Jarvis to relay result information. Glenn Jarvis requested that his daughter Glenn Jarvis be called and updated.   LVM for callback from [name] for results per Dr. Vicente Males.    - inform the iron studies are normal now and he doesn't need any IV iron. Surprisingly his celiac blood test is positive and to know for sure if he has celiac disease, will need an EGD, if happy to schedule ok otherwise we can discuss at next visit

## 2018-01-31 ENCOUNTER — Other Ambulatory Visit: Payer: Self-pay | Admitting: Family Medicine

## 2018-02-01 DIAGNOSIS — N184 Chronic kidney disease, stage 4 (severe): Secondary | ICD-10-CM | POA: Diagnosis not present

## 2018-02-01 DIAGNOSIS — I10 Essential (primary) hypertension: Secondary | ICD-10-CM | POA: Diagnosis not present

## 2018-02-01 DIAGNOSIS — N2581 Secondary hyperparathyroidism of renal origin: Secondary | ICD-10-CM | POA: Diagnosis not present

## 2018-02-01 DIAGNOSIS — D631 Anemia in chronic kidney disease: Secondary | ICD-10-CM | POA: Diagnosis not present

## 2018-02-18 ENCOUNTER — Encounter: Payer: Self-pay | Admitting: Family Medicine

## 2018-02-18 ENCOUNTER — Ambulatory Visit (INDEPENDENT_AMBULATORY_CARE_PROVIDER_SITE_OTHER): Payer: Medicare HMO | Admitting: Family Medicine

## 2018-02-18 ENCOUNTER — Ambulatory Visit: Payer: Medicare HMO | Admitting: Gastroenterology

## 2018-02-18 VITALS — BP 128/58 | HR 79 | Temp 98.9°F | Wt 150.0 lb

## 2018-02-18 DIAGNOSIS — I10 Essential (primary) hypertension: Secondary | ICD-10-CM | POA: Diagnosis not present

## 2018-02-18 DIAGNOSIS — M25532 Pain in left wrist: Secondary | ICD-10-CM | POA: Diagnosis not present

## 2018-02-18 DIAGNOSIS — N183 Chronic kidney disease, stage 3 (moderate): Secondary | ICD-10-CM | POA: Diagnosis not present

## 2018-02-18 DIAGNOSIS — M109 Gout, unspecified: Secondary | ICD-10-CM | POA: Diagnosis not present

## 2018-02-18 DIAGNOSIS — D631 Anemia in chronic kidney disease: Secondary | ICD-10-CM | POA: Diagnosis not present

## 2018-02-18 DIAGNOSIS — R809 Proteinuria, unspecified: Secondary | ICD-10-CM | POA: Diagnosis not present

## 2018-02-18 MED ORDER — PREDNISONE 10 MG PO TABS: 10.0000 mg | ORAL_TABLET | Freq: Every day | ORAL | 0 refills | Status: DC

## 2018-02-18 NOTE — Progress Notes (Signed)
Patient: Glenn Vandervelden Sr. Male    DOB: 09/24/1927   84 y.o.   MRN: [id number] Visit Date: 02/18/2018  Today's Provider: Vernie Murders, PA   Chief Complaint  Patient presents with  . Arm Pain   Subjective:    Arm Pain   Incident onset: yesterday. There was no injury mechanism. The pain is present in the left wrist, left forearm, left shoulder, left elbow and left fingers. The quality of the pain is described as aching. The pain has been constant since the incident. Associated symptoms comments: Limited mobility . The symptoms are aggravated by movement. Treatments tried: pain medication he had left over. The treatment provided no relief.  Patient has a medical history of gout.   Past Medical History:  Diagnosis Date  . Arthritis   . GERD (gastroesophageal reflux disease)   . Hemorrhoid 1986  . Hyperlipidemia   . Hypertension   . Thyroid disease   . Vertigo    Past Surgical History:  Procedure Laterality Date  . APPENDECTOMY  1968  . BACK SURGERY    . COLONOSCOPY N/A 11/27/2017   Procedure: COLONOSCOPY;  Surgeon: Toledo, Benay Pike, MD;  Location: [facility] ENDOSCOPY;  Service: Gastroenterology;  Laterality: N/A;  . ESOPHAGOGASTRODUODENOSCOPY (EGD) WITH PROPOFOL N/A 11/25/2017   Procedure: ESOPHAGOGASTRODUODENOSCOPY (EGD) WITH PROPOFOL;  Surgeon: Toledo, Benay Pike, MD;  Location: [facility] ENDOSCOPY;  Service: Gastroenterology;  Laterality: N/A;  . EXCISIONAL HEMORRHOIDECTOMY    . INGUINAL HERNIA REPAIR Right 03/17/2016   Procedure: LAPAROSCOPIC INGUINAL HERNIA;  Surgeon: Florene Glen, MD;  Location: [facility] ORS;  Service: General;  Laterality: Right;  . SPINE SURGERY  1984   Family History  Problem Relation Age of Onset  . Alzheimer's disease Father   . Healthy Sister    Allergies  Allergen Reactions  . Codeine   . Cortizone-10  [Hydrocortisone]     Current Outpatient Medications:  .  colchicine 0.6 MG tablet, Take 1 tablet (0.6 mg total) by mouth 2 (two) times daily.,  Disp: 60 tablet, Rfl: 3 .  dexlansoprazole (DEXILANT) 60 MG capsule, Take 1 capsule (60 mg total) by mouth daily., Disp: 30 capsule, Rfl: 3 .  hydrALAZINE (APRESOLINE) 25 MG tablet, Take 25 mg by mouth 3 (three) times daily., Disp: , Rfl:  .  levothyroxine (SYNTHROID, LEVOTHROID) 50 MCG tablet, TAKE 1 TABLET BY MOUTH ONCE DAILY FOR THYROID, ON AN EMPTY STOMACH. WAIT 30 MINUTES BEFORE TAKING OTHER MEDS., Disp: 90 tablet, Rfl: 3 .  losartan (COZAAR) 100 MG tablet, TAKE 1 TABLET BY MOUTH ONCE DAILY FOR BLOOD PRESSURE (HIGH), Disp: 90 tablet, Rfl: 3 .  metoprolol tartrate (LOPRESSOR) 25 MG tablet, Take 0.5 tablets (12.5 mg total) by mouth 2 (two) times daily., Disp: 90 tablet, Rfl: 3 .  pantoprazole (PROTONIX) 40 MG tablet, Take 1 tablet (40 mg total) by mouth daily., Disp: 90 tablet, Rfl: 3 .  traMADol (ULTRAM) 50 MG tablet, Take 1 tablet (50 mg total) by mouth every 8 (eight) hours as needed., Disp: 30 tablet, Rfl: 2 .  triamcinolone (KENALOG) 0.025 % cream, TRIAMCINOLONE ACETONIDE, 0.025% (External Cream)  1 (one) Cream Cream apply daily as needed for 0 days  Quantity: 60;  Refills: 0   Ordered :13-Apr-2014  [name] ;  Started 13-Apr-2014 Active Comments: Medication taken as needed. , Disp: , Rfl:  .  furosemide (LASIX) 20 MG tablet, TAKE 1 TABLET BY MOUTH ONCE DAILY FOR EDEMA/BP (Patient not taking: Reported on 02/18/2018), Disp: 90 tablet, Rfl:  3 .  gabapentin (NEURONTIN) 100 MG capsule, Take 1 capsule (100 mg total) by mouth 3 (three) times daily. (Patient not taking: Reported on 01/21/2018), Disp: 90 capsule, Rfl: 3   Social History   Tobacco Use  . Smoking status: Former Smoker    Packs/day: 2.00    Years: 25.00    Pack years: 50.00    Last attempt to quit: 12/28/1965    Years since quitting: 52.1  . Smokeless tobacco: Never Used  Substance Use Topics  . Alcohol use: No    Frequency: Never    Comment: 1 beer occasionally   Objective:   BP (!) 128/58 (BP Location: Right Arm,  Patient Position: Sitting, Cuff Size: Normal)   Pulse 79   Temp 98.9 F (37.2 C) (Oral)   Wt 150 lb (68 kg)   SpO2 95%   BMI 20.34 kg/m    Physical Exam  Constitutional: He is oriented to person, place, and time. He appears well-developed and well-nourished. No distress.  HENT:  Head: Normocephalic and atraumatic.  Right Ear: Hearing normal.  Left Ear: Hearing normal.  Nose: Nose normal.  Eyes: Conjunctivae and lids are normal. Right eye exhibits no discharge. Left eye exhibits no discharge. No scleral icterus.  Pulmonary/Chest: Effort normal. No respiratory distress.  Musculoskeletal: Normal range of motion. He exhibits tenderness.  Erythematous and painful left wrist with swelling. History of gout.   Neurological: He is alert and oriented to person, place, and time.  Skin: Skin is intact. No lesion and no rash noted.  Psychiatric: He has a normal mood and affect. His speech is normal and behavior is normal. Thought content normal.      Assessment & Plan:     1. Left wrist pain Onset yesterday with swelling and redness. No known injury or fever. Nephrologist (Dr. Candiss Norse) had blood drawn for lab tests this morning. Suspect acute gout in the left wrist. Will treat with Prednisone and look for CBC count from nephrologist's lab work to rule out any infection. Recheck pending report. - predniSONE (DELTASONE) 10 MG tablet; Take 1 tablet (10 mg total) by mouth daily with breakfast. Taper down down by one tablet daily (6,5,4,3,2,1)  Dispense: 21 tablet; Refill: 0  2. Gout, unspecified cause, unspecified chronicity, unspecified site Usually affects left MTP joint in foot. Has tried Colchicine in the past with Tramadol. May use Tramadol with the prednisone this time if needed for pain.       Vernie Murders, PA  [facility]

## 2018-03-01 ENCOUNTER — Ambulatory Visit: Payer: Medicare HMO | Admitting: Family Medicine

## 2018-03-01 NOTE — Progress Notes (Deleted)
       Patient: Glenn Kats Sr. Male    DOB: [birth year]   84 y.o.   MRN: [id number] Visit Date: 03/01/2018  Today's Provider: Vernie Murders, PA   No chief complaint on file.  Subjective:    Arm Pain         Allergies  Allergen Reactions  . Codeine   . Cortizone-10  [Hydrocortisone]      Current Outpatient Medications:  .  colchicine 0.6 MG tablet, Take 1 tablet (0.6 mg total) by mouth 2 (two) times daily., Disp: 60 tablet, Rfl: 3 .  dexlansoprazole (DEXILANT) 60 MG capsule, Take 1 capsule (60 mg total) by mouth daily., Disp: 30 capsule, Rfl: 3 .  furosemide (LASIX) 20 MG tablet, TAKE 1 TABLET BY MOUTH ONCE DAILY FOR EDEMA/BP (Patient not taking: Reported on 02/18/2018), Disp: 90 tablet, Rfl: 3 .  gabapentin (NEURONTIN) 100 MG capsule, Take 1 capsule (100 mg total) by mouth 3 (three) times daily. (Patient not taking: Reported on 01/21/2018), Disp: 90 capsule, Rfl: 3 .  hydrALAZINE (APRESOLINE) 25 MG tablet, Take 25 mg by mouth 3 (three) times daily., Disp: , Rfl:  .  levothyroxine (SYNTHROID, LEVOTHROID) 50 MCG tablet, TAKE 1 TABLET BY MOUTH ONCE DAILY FOR THYROID, ON AN EMPTY STOMACH. WAIT 30 MINUTES BEFORE TAKING OTHER MEDS., Disp: 90 tablet, Rfl: 3 .  losartan (COZAAR) 100 MG tablet, TAKE 1 TABLET BY MOUTH ONCE DAILY FOR BLOOD PRESSURE (HIGH), Disp: 90 tablet, Rfl: 3 .  metoprolol tartrate (LOPRESSOR) 25 MG tablet, Take 0.5 tablets (12.5 mg total) by mouth 2 (two) times daily., Disp: 90 tablet, Rfl: 3 .  pantoprazole (PROTONIX) 40 MG tablet, Take 1 tablet (40 mg total) by mouth daily., Disp: 90 tablet, Rfl: 3 .  predniSONE (DELTASONE) 10 MG tablet, Take 1 tablet (10 mg total) by mouth daily with breakfast. Taper down down by one tablet daily (6,5,4,3,2,1), Disp: 21 tablet, Rfl: 0 .  traMADol (ULTRAM) 50 MG tablet, Take 1 tablet (50 mg total) by mouth every 8 (eight) hours as needed., Disp: 30 tablet, Rfl: 2 .  triamcinolone (KENALOG) 0.025 % cream, TRIAMCINOLONE ACETONIDE,  0.025% (External Cream)  1 (one) Cream Cream apply daily as needed for 0 days  Quantity: 60;  Refills: 0   Ordered :13-Apr-2014  [name] ;  Started 13-Apr-2014 Active Comments: Medication taken as needed. , Disp: , Rfl:   Review of Systems  Social History   Tobacco Use  . Smoking status: Former Smoker    Packs/day: 2.00    Years: 25.00    Pack years: 50.00    Last attempt to quit: 12/28/1965    Years since quitting: 52.2  . Smokeless tobacco: Never Used  Substance Use Topics  . Alcohol use: No    Frequency: Never    Comment: 1 beer occasionally   Objective:   There were no vitals taken for this visit.   Physical Exam      Assessment & Plan:           Vernie Murders, PA  [facility]

## 2018-03-03 ENCOUNTER — Encounter: Payer: Self-pay | Admitting: Gastroenterology

## 2018-03-03 ENCOUNTER — Other Ambulatory Visit: Payer: Self-pay | Admitting: Family Medicine

## 2018-03-03 ENCOUNTER — Telehealth: Payer: Self-pay | Admitting: Family Medicine

## 2018-03-03 ENCOUNTER — Ambulatory Visit: Payer: Medicare HMO | Admitting: Gastroenterology

## 2018-03-03 DIAGNOSIS — M109 Gout, unspecified: Secondary | ICD-10-CM

## 2018-03-03 MED ORDER — TRAMADOL HCL 50 MG PO TABS: 50.0000 mg | ORAL_TABLET | Freq: Three times a day (TID) | ORAL | 2 refills | Status: DC | PRN

## 2018-03-03 NOTE — Telephone Encounter (Signed)
Will print refill for Tramadol. Advise patient to let us see the wrist again if still painful. Will need to get an x-ray to see if the gout is causing joint destruction.

## 2018-03-03 NOTE — Telephone Encounter (Signed)
Pharmacy is requesting refill on his traMADol (ULTRAM) 50 MG tablet    [facility]

## 2018-03-04 DIAGNOSIS — I10 Essential (primary) hypertension: Secondary | ICD-10-CM | POA: Diagnosis not present

## 2018-03-04 DIAGNOSIS — N183 Chronic kidney disease, stage 3 (moderate): Secondary | ICD-10-CM | POA: Diagnosis not present

## 2018-03-04 DIAGNOSIS — R809 Proteinuria, unspecified: Secondary | ICD-10-CM | POA: Diagnosis not present

## 2018-03-04 DIAGNOSIS — D631 Anemia in chronic kidney disease: Secondary | ICD-10-CM | POA: Diagnosis not present

## 2018-03-04 NOTE — Telephone Encounter (Signed)
RX called in at [facility]. Pharmacist will advise patient to call office if pain persist for a x-ray per [facility].

## 2018-03-05 ENCOUNTER — Telehealth: Payer: Self-pay | Admitting: Family Medicine

## 2018-03-05 ENCOUNTER — Other Ambulatory Visit: Payer: Self-pay | Admitting: Family Medicine

## 2018-03-05 DIAGNOSIS — M1990 Unspecified osteoarthritis, unspecified site: Secondary | ICD-10-CM

## 2018-03-05 NOTE — Telephone Encounter (Signed)
Pt's daughter [name] stated they are ready to start physical therapy again. [name] is requesting that Simona Huh send a referral so that pt can restart physical therapy. Please advise. Thanks TNP

## 2018-03-05 NOTE — Telephone Encounter (Signed)
Patient's daughter advised.  

## 2018-03-05 NOTE — Telephone Encounter (Signed)
Placed order in chart to re-establish physical therapy assessment for fall prevention and mobility.

## 2018-03-12 ENCOUNTER — Telehealth: Payer: Self-pay

## 2018-03-12 NOTE — Telephone Encounter (Signed)
Called pt to schedule AWV and pt requested we call his daughter for apts. Called number both numbers on file and was unable to contact daughter. Will try again later.  -MM

## 2018-03-15 ENCOUNTER — Telehealth: Payer: Self-pay | Admitting: Family Medicine

## 2018-03-15 DIAGNOSIS — I129 Hypertensive chronic kidney disease with stage 1 through stage 4 chronic kidney disease, or unspecified chronic kidney disease: Secondary | ICD-10-CM | POA: Diagnosis not present

## 2018-03-15 DIAGNOSIS — N189 Chronic kidney disease, unspecified: Secondary | ICD-10-CM | POA: Diagnosis not present

## 2018-03-15 DIAGNOSIS — M1030 Gout due to renal impairment, unspecified site: Secondary | ICD-10-CM | POA: Diagnosis not present

## 2018-03-15 DIAGNOSIS — M19022 Primary osteoarthritis, left elbow: Secondary | ICD-10-CM | POA: Diagnosis not present

## 2018-03-15 DIAGNOSIS — M19012 Primary osteoarthritis, left shoulder: Secondary | ICD-10-CM | POA: Diagnosis not present

## 2018-03-15 DIAGNOSIS — M19032 Primary osteoarthritis, left wrist: Secondary | ICD-10-CM | POA: Diagnosis not present

## 2018-03-15 NOTE — Telephone Encounter (Signed)
Proceed with verbal orders for social worker to evaluate for needs, OT to evaluate and treat with home health aid for toileting and bathing. Also, PT for twice a week for 6 weeks of gait and transfer training.

## 2018-03-15 NOTE — Telephone Encounter (Signed)
[name] with [facility] is calling to get verbal orders social worker to evaluate for meals on wheels and help family with other community resources.  She also need OT evaluate and treat for training in ADL's.  Verbal orders for home health aid for toileting and bathing.  CB# [phone]  Plan of care for patient it PT for twice a week for six weeks.  Gait and transfer training.  She did not find any medication issues.

## 2018-03-16 NOTE — Telephone Encounter (Signed)
LMTCB

## 2018-03-16 NOTE — Telephone Encounter (Signed)
Verbal orders given to [facility] with [facility]

## 2018-03-17 ENCOUNTER — Telehealth: Payer: Self-pay | Admitting: Family Medicine

## 2018-03-17 DIAGNOSIS — I129 Hypertensive chronic kidney disease with stage 1 through stage 4 chronic kidney disease, or unspecified chronic kidney disease: Secondary | ICD-10-CM | POA: Diagnosis not present

## 2018-03-17 DIAGNOSIS — M1030 Gout due to renal impairment, unspecified site: Secondary | ICD-10-CM | POA: Diagnosis not present

## 2018-03-17 DIAGNOSIS — N189 Chronic kidney disease, unspecified: Secondary | ICD-10-CM | POA: Diagnosis not present

## 2018-03-17 DIAGNOSIS — M19032 Primary osteoarthritis, left wrist: Secondary | ICD-10-CM | POA: Diagnosis not present

## 2018-03-17 DIAGNOSIS — M19022 Primary osteoarthritis, left elbow: Secondary | ICD-10-CM | POA: Diagnosis not present

## 2018-03-17 DIAGNOSIS — M19012 Primary osteoarthritis, left shoulder: Secondary | ICD-10-CM | POA: Diagnosis not present

## 2018-03-17 NOTE — Telephone Encounter (Signed)
Don from [facility] is calling to let you know that caregiver declined OT evaluation on 03/18/2018 and requested the 03/19/2018 and that is past the 48 hour window that they are supposed to be in to start care.

## 2018-03-18 NOTE — Telephone Encounter (Signed)
Re-establish order for OT evaluation.

## 2018-03-19 ENCOUNTER — Telehealth: Payer: Self-pay

## 2018-03-19 DIAGNOSIS — M1030 Gout due to renal impairment, unspecified site: Secondary | ICD-10-CM | POA: Diagnosis not present

## 2018-03-19 DIAGNOSIS — I129 Hypertensive chronic kidney disease with stage 1 through stage 4 chronic kidney disease, or unspecified chronic kidney disease: Secondary | ICD-10-CM | POA: Diagnosis not present

## 2018-03-19 DIAGNOSIS — M19012 Primary osteoarthritis, left shoulder: Secondary | ICD-10-CM | POA: Diagnosis not present

## 2018-03-19 DIAGNOSIS — M19032 Primary osteoarthritis, left wrist: Secondary | ICD-10-CM | POA: Diagnosis not present

## 2018-03-19 DIAGNOSIS — N189 Chronic kidney disease, unspecified: Secondary | ICD-10-CM | POA: Diagnosis not present

## 2018-03-19 DIAGNOSIS — M19022 Primary osteoarthritis, left elbow: Secondary | ICD-10-CM | POA: Diagnosis not present

## 2018-03-19 NOTE — Telephone Encounter (Signed)
BP stable and nephrologist monitoring for hypotension from use of Metoprolol and Hydralazine. Patient reported he is no longer taking the Gabapentin since January of 2019. Given verbal order to proceed with 4 more OT visits for gait, if patient will allow.

## 2018-03-19 NOTE — Telephone Encounter (Signed)
Advised  ED 

## 2018-03-19 NOTE — Telephone Encounter (Signed)
Jennifer advised  

## 2018-03-19 NOTE — Telephone Encounter (Signed)
Anderson Malta from [facility] states there is an interaction between pt's tramadol and gabapentin, as well as an interaction between metoprolol and hydralazine. Please advise. Anderson Malta is also asking for verbal orders for 4 more OT visits for gait. CB # [phone].

## 2018-03-22 DIAGNOSIS — I129 Hypertensive chronic kidney disease with stage 1 through stage 4 chronic kidney disease, or unspecified chronic kidney disease: Secondary | ICD-10-CM | POA: Diagnosis not present

## 2018-03-22 DIAGNOSIS — M19022 Primary osteoarthritis, left elbow: Secondary | ICD-10-CM | POA: Diagnosis not present

## 2018-03-22 DIAGNOSIS — N189 Chronic kidney disease, unspecified: Secondary | ICD-10-CM | POA: Diagnosis not present

## 2018-03-22 DIAGNOSIS — M19032 Primary osteoarthritis, left wrist: Secondary | ICD-10-CM | POA: Diagnosis not present

## 2018-03-22 DIAGNOSIS — M1030 Gout due to renal impairment, unspecified site: Secondary | ICD-10-CM | POA: Diagnosis not present

## 2018-03-22 DIAGNOSIS — M19012 Primary osteoarthritis, left shoulder: Secondary | ICD-10-CM | POA: Diagnosis not present

## 2018-03-23 DIAGNOSIS — N189 Chronic kidney disease, unspecified: Secondary | ICD-10-CM | POA: Diagnosis not present

## 2018-03-23 DIAGNOSIS — M1030 Gout due to renal impairment, unspecified site: Secondary | ICD-10-CM | POA: Diagnosis not present

## 2018-03-23 DIAGNOSIS — M19032 Primary osteoarthritis, left wrist: Secondary | ICD-10-CM | POA: Diagnosis not present

## 2018-03-23 DIAGNOSIS — I129 Hypertensive chronic kidney disease with stage 1 through stage 4 chronic kidney disease, or unspecified chronic kidney disease: Secondary | ICD-10-CM | POA: Diagnosis not present

## 2018-03-23 DIAGNOSIS — M19012 Primary osteoarthritis, left shoulder: Secondary | ICD-10-CM | POA: Diagnosis not present

## 2018-03-23 DIAGNOSIS — M19022 Primary osteoarthritis, left elbow: Secondary | ICD-10-CM | POA: Diagnosis not present

## 2018-03-24 DIAGNOSIS — M1030 Gout due to renal impairment, unspecified site: Secondary | ICD-10-CM | POA: Diagnosis not present

## 2018-03-24 DIAGNOSIS — M19032 Primary osteoarthritis, left wrist: Secondary | ICD-10-CM | POA: Diagnosis not present

## 2018-03-24 DIAGNOSIS — N189 Chronic kidney disease, unspecified: Secondary | ICD-10-CM | POA: Diagnosis not present

## 2018-03-24 DIAGNOSIS — M19022 Primary osteoarthritis, left elbow: Secondary | ICD-10-CM | POA: Diagnosis not present

## 2018-03-24 DIAGNOSIS — I129 Hypertensive chronic kidney disease with stage 1 through stage 4 chronic kidney disease, or unspecified chronic kidney disease: Secondary | ICD-10-CM | POA: Diagnosis not present

## 2018-03-24 DIAGNOSIS — M19012 Primary osteoarthritis, left shoulder: Secondary | ICD-10-CM | POA: Diagnosis not present

## 2018-03-25 ENCOUNTER — Telehealth: Payer: Self-pay | Admitting: Family Medicine

## 2018-03-25 NOTE — Telephone Encounter (Signed)
Please review. Thanks!  

## 2018-03-25 NOTE — Telephone Encounter (Signed)
Acknowledged. He is not demented and can't be forced to accept therapy. Will see if the have luck convincing him next week.

## 2018-03-25 NOTE — Telephone Encounter (Signed)
Glenn Jarvis called to say that Glenn Jarvis refused OT therapy today.   They are going back next week.

## 2018-03-26 DIAGNOSIS — M19012 Primary osteoarthritis, left shoulder: Secondary | ICD-10-CM | POA: Diagnosis not present

## 2018-03-26 DIAGNOSIS — N189 Chronic kidney disease, unspecified: Secondary | ICD-10-CM | POA: Diagnosis not present

## 2018-03-26 DIAGNOSIS — M19022 Primary osteoarthritis, left elbow: Secondary | ICD-10-CM | POA: Diagnosis not present

## 2018-03-26 DIAGNOSIS — M1030 Gout due to renal impairment, unspecified site: Secondary | ICD-10-CM | POA: Diagnosis not present

## 2018-03-26 DIAGNOSIS — I129 Hypertensive chronic kidney disease with stage 1 through stage 4 chronic kidney disease, or unspecified chronic kidney disease: Secondary | ICD-10-CM | POA: Diagnosis not present

## 2018-03-26 DIAGNOSIS — M19072 Primary osteoarthritis, left ankle and foot: Secondary | ICD-10-CM | POA: Diagnosis not present

## 2018-03-26 DIAGNOSIS — M19032 Primary osteoarthritis, left wrist: Secondary | ICD-10-CM | POA: Diagnosis not present

## 2018-03-29 ENCOUNTER — Telehealth: Payer: Self-pay | Admitting: Family Medicine

## 2018-03-29 ENCOUNTER — Inpatient Hospital Stay
Admission: EM | Admit: 2018-03-29 | Discharge: 2018-04-02 | DRG: 309 | Disposition: A | Discharge status: Skilled Nursing Facility | Payer: Medicare HMO | Attending: Internal Medicine | Admitting: Internal Medicine

## 2018-03-29 ENCOUNTER — Other Ambulatory Visit: Payer: Self-pay

## 2018-03-29 ENCOUNTER — Encounter: Payer: Self-pay | Admitting: Emergency Medicine

## 2018-03-29 DIAGNOSIS — I248 Other forms of acute ischemic heart disease: Secondary | ICD-10-CM | POA: Diagnosis not present

## 2018-03-29 DIAGNOSIS — I471 Supraventricular tachycardia, unspecified: Secondary | ICD-10-CM | POA: Diagnosis present

## 2018-03-29 DIAGNOSIS — N184 Chronic kidney disease, stage 4 (severe): Secondary | ICD-10-CM | POA: Diagnosis present

## 2018-03-29 DIAGNOSIS — R42 Dizziness and giddiness: Secondary | ICD-10-CM | POA: Diagnosis present

## 2018-03-29 DIAGNOSIS — K219 Gastro-esophageal reflux disease without esophagitis: Secondary | ICD-10-CM | POA: Diagnosis not present

## 2018-03-29 DIAGNOSIS — E079 Disorder of thyroid, unspecified: Secondary | ICD-10-CM | POA: Diagnosis present

## 2018-03-29 DIAGNOSIS — M109 Gout, unspecified: Secondary | ICD-10-CM | POA: Diagnosis not present

## 2018-03-29 DIAGNOSIS — N179 Acute kidney failure, unspecified: Secondary | ICD-10-CM | POA: Diagnosis present

## 2018-03-29 DIAGNOSIS — R410 Disorientation, unspecified: Secondary | ICD-10-CM | POA: Diagnosis not present

## 2018-03-29 DIAGNOSIS — Z885 Allergy status to narcotic agent status: Secondary | ICD-10-CM

## 2018-03-29 DIAGNOSIS — Z87891 Personal history of nicotine dependence: Secondary | ICD-10-CM | POA: Diagnosis not present

## 2018-03-29 DIAGNOSIS — Z79899 Other long term (current) drug therapy: Secondary | ICD-10-CM

## 2018-03-29 DIAGNOSIS — R1032 Left lower quadrant pain: Secondary | ICD-10-CM | POA: Diagnosis not present

## 2018-03-29 DIAGNOSIS — R531 Weakness: Secondary | ICD-10-CM | POA: Diagnosis present

## 2018-03-29 DIAGNOSIS — R000 Tachycardia, unspecified: Secondary | ICD-10-CM

## 2018-03-29 DIAGNOSIS — E030 Congenital hypothyroidism with diffuse goiter: Secondary | ICD-10-CM | POA: Diagnosis not present

## 2018-03-29 DIAGNOSIS — M10012 Idiopathic gout, left shoulder: Secondary | ICD-10-CM | POA: Diagnosis not present

## 2018-03-29 DIAGNOSIS — Z4689 Encounter for fitting and adjustment of other specified devices: Secondary | ICD-10-CM | POA: Diagnosis not present

## 2018-03-29 DIAGNOSIS — Z7401 Bed confinement status: Secondary | ICD-10-CM | POA: Diagnosis not present

## 2018-03-29 DIAGNOSIS — Z888 Allergy status to other drugs, medicaments and biological substances status: Secondary | ICD-10-CM | POA: Diagnosis not present

## 2018-03-29 DIAGNOSIS — I129 Hypertensive chronic kidney disease with stage 1 through stage 4 chronic kidney disease, or unspecified chronic kidney disease: Secondary | ICD-10-CM | POA: Diagnosis present

## 2018-03-29 DIAGNOSIS — Z79890 Hormone replacement therapy (postmenopausal): Secondary | ICD-10-CM

## 2018-03-29 DIAGNOSIS — G629 Polyneuropathy, unspecified: Secondary | ICD-10-CM | POA: Diagnosis present

## 2018-03-29 DIAGNOSIS — R4182 Altered mental status, unspecified: Secondary | ICD-10-CM | POA: Diagnosis not present

## 2018-03-29 DIAGNOSIS — R001 Bradycardia, unspecified: Secondary | ICD-10-CM | POA: Diagnosis not present

## 2018-03-29 DIAGNOSIS — M1000 Idiopathic gout, unspecified site: Secondary | ICD-10-CM | POA: Diagnosis present

## 2018-03-29 DIAGNOSIS — K659 Peritonitis, unspecified: Secondary | ICD-10-CM | POA: Diagnosis not present

## 2018-03-29 DIAGNOSIS — E039 Hypothyroidism, unspecified: Secondary | ICD-10-CM | POA: Diagnosis present

## 2018-03-29 DIAGNOSIS — E785 Hyperlipidemia, unspecified: Secondary | ICD-10-CM | POA: Diagnosis present

## 2018-03-29 DIAGNOSIS — N183 Chronic kidney disease, stage 3 (moderate): Secondary | ICD-10-CM | POA: Diagnosis not present

## 2018-03-29 DIAGNOSIS — M1990 Unspecified osteoarthritis, unspecified site: Secondary | ICD-10-CM | POA: Diagnosis not present

## 2018-03-29 DIAGNOSIS — M1009 Idiopathic gout, multiple sites: Secondary | ICD-10-CM | POA: Diagnosis not present

## 2018-03-29 DIAGNOSIS — I10 Essential (primary) hypertension: Secondary | ICD-10-CM | POA: Diagnosis not present

## 2018-03-29 LAB — CBC
HEMATOCRIT: 29.4 % — AB (ref 40.0–52.0)
Hemoglobin: 9.6 g/dL — ABNORMAL LOW (ref 13.0–18.0)
MCH: 28.6 pg (ref 26.0–34.0)
MCHC: 32.7 g/dL (ref 32.0–36.0)
MCV: 87.3 fL (ref 80.0–100.0)
PLATELETS: 97 10*3/uL — AB (ref 150–440)
RBC: 3.36 MIL/uL — AB (ref 4.40–5.90)
RDW: 16.6 % — AB (ref 11.5–14.5)
WBC: 11.7 10*3/uL — AB (ref 3.8–10.6)

## 2018-03-29 LAB — URINALYSIS, COMPLETE (UACMP) WITH MICROSCOPIC
BACTERIA UA: NONE SEEN
BILIRUBIN URINE: NEGATIVE
GLUCOSE, UA: NEGATIVE mg/dL
KETONES UR: NEGATIVE mg/dL
LEUKOCYTES UA: NEGATIVE
NITRITE: NEGATIVE
PH: 5 (ref 5.0–8.0)
PROTEIN: 30 mg/dL — AB
SQUAMOUS EPITHELIAL / LPF: NONE SEEN
Specific Gravity, Urine: 1.014 (ref 1.005–1.030)

## 2018-03-29 LAB — BASIC METABOLIC PANEL
Anion gap: 8 (ref 5–15)
BUN: 29 mg/dL — ABNORMAL HIGH (ref 6–20)
CHLORIDE: 102 mmol/L (ref 101–111)
CO2: 24 mmol/L (ref 22–32)
Calcium: 8.4 mg/dL — ABNORMAL LOW (ref 8.9–10.3)
Creatinine, Ser: 2.28 mg/dL — ABNORMAL HIGH (ref 0.61–1.24)
GFR, EST AFRICAN AMERICAN: 28 mL/min — AB (ref >60)
GFR, EST NON AFRICAN AMERICAN: 24 mL/min — AB (ref >60)
Glucose, Bld: 130 mg/dL — ABNORMAL HIGH (ref 65–99)
POTASSIUM: 3.9 mmol/L (ref 3.5–5.1)
SODIUM: 134 mmol/L — AB (ref 135–145)

## 2018-03-29 LAB — TROPONIN I: Troponin I: 0.04 ng/mL (ref <0.03)

## 2018-03-29 LAB — URIC ACID: Uric Acid, Serum: 8.5 mg/dL — ABNORMAL HIGH (ref 4.4–7.6)

## 2018-03-29 MED ORDER — METHYLPREDNISOLONE SODIUM SUCC 125 MG IJ SOLR
125.0000 mg | Freq: Once | INTRAMUSCULAR | Status: AC
  Administered 2018-03-29: 125 mg via INTRAVENOUS
  Filled 2018-03-29: qty 2

## 2018-03-29 MED ORDER — ONDANSETRON HCL 4 MG PO TABS: 4.0000 mg | ORAL_TABLET | Freq: Four times a day (QID) | ORAL | Status: DC | PRN

## 2018-03-29 MED ORDER — ONDANSETRON HCL 4 MG/2ML IJ SOLN: 4.0000 mg | Freq: Four times a day (QID) | INTRAMUSCULAR | Status: DC | PRN

## 2018-03-29 MED ORDER — ACETAMINOPHEN 325 MG PO TABS: 650.0000 mg | ORAL_TABLET | Freq: Four times a day (QID) | ORAL | Status: DC | PRN

## 2018-03-29 MED ORDER — DILTIAZEM HCL 25 MG/5ML IV SOLN
10.0000 mg | Freq: Once | INTRAVENOUS | Status: AC
  Administered 2018-03-29: 10 mg via INTRAVENOUS

## 2018-03-29 MED ORDER — ACETAMINOPHEN 650 MG RE SUPP: 650.0000 mg | Freq: Four times a day (QID) | RECTAL | Status: DC | PRN

## 2018-03-29 MED ORDER — METOPROLOL TARTRATE 25 MG PO TABS: 12.5000 mg | ORAL_TABLET | Freq: Two times a day (BID) | ORAL | Status: DC

## 2018-03-29 MED ORDER — DILTIAZEM HCL 25 MG/5ML IV SOLN
INTRAVENOUS | Status: AC
  Filled 2018-03-29: qty 5

## 2018-03-29 MED ORDER — LOSARTAN POTASSIUM 50 MG PO TABS
100.0000 mg | ORAL_TABLET | Freq: Every day | ORAL | Status: DC
  Administered 2018-03-30 – 2018-03-31 (×2): 100 mg via ORAL
  Filled 2018-03-29 (×2): qty 2

## 2018-03-29 MED ORDER — ENOXAPARIN SODIUM 30 MG/0.3ML ~~LOC~~ SOLN
30.0000 mg | SUBCUTANEOUS | Status: DC
  Administered 2018-03-29 – 2018-03-30 (×2): 30 mg via SUBCUTANEOUS
  Filled 2018-03-29 (×2): qty 0.3

## 2018-03-29 MED ORDER — TRAMADOL HCL 50 MG PO TABS
50.0000 mg | ORAL_TABLET | Freq: Four times a day (QID) | ORAL | Status: DC | PRN
  Administered 2018-03-29: 50 mg via ORAL
  Filled 2018-03-29: qty 1

## 2018-03-29 MED ORDER — GABAPENTIN 100 MG PO CAPS
100.0000 mg | ORAL_CAPSULE | Freq: Three times a day (TID) | ORAL | Status: DC
  Administered 2018-03-29 – 2018-04-02 (×10): 100 mg via ORAL
  Filled 2018-03-29 (×12): qty 1

## 2018-03-29 MED ORDER — ADENOSINE 6 MG/2ML IV SOLN
INTRAVENOUS | Status: AC
  Filled 2018-03-29: qty 2

## 2018-03-29 MED ORDER — FUROSEMIDE 40 MG PO TABS
20.0000 mg | ORAL_TABLET | Freq: Every day | ORAL | Status: DC
  Administered 2018-03-29 – 2018-03-30 (×2): 20 mg via ORAL
  Filled 2018-03-29 (×2): qty 1

## 2018-03-29 MED ORDER — COLCHICINE 0.6 MG PO TABS
0.6000 mg | ORAL_TABLET | Freq: Two times a day (BID) | ORAL | Status: DC
  Administered 2018-03-29 – 2018-04-01 (×6): 0.6 mg via ORAL
  Filled 2018-03-29 (×6): qty 1

## 2018-03-29 MED ORDER — PANTOPRAZOLE SODIUM 40 MG PO TBEC
40.0000 mg | DELAYED_RELEASE_TABLET | Freq: Every day | ORAL | Status: DC
  Administered 2018-03-30 – 2018-04-02 (×3): 40 mg via ORAL
  Filled 2018-03-29 (×4): qty 1

## 2018-03-29 MED ORDER — ADENOSINE 12 MG/4ML IV SOLN
INTRAVENOUS | Status: AC
  Filled 2018-03-29: qty 4

## 2018-03-29 MED ORDER — SODIUM CHLORIDE 0.9% FLUSH
3.0000 mL | Freq: Two times a day (BID) | INTRAVENOUS | Status: DC
  Administered 2018-03-29 – 2018-04-02 (×7): 3 mL via INTRAVENOUS

## 2018-03-29 MED ORDER — METOPROLOL TARTRATE 5 MG/5ML IV SOLN
5.0000 mg | Freq: Once | INTRAVENOUS | Status: AC
  Administered 2018-03-29: 5 mg via INTRAVENOUS
  Filled 2018-03-29: qty 5

## 2018-03-29 MED ORDER — METOPROLOL TARTRATE 25 MG PO TABS
25.0000 mg | ORAL_TABLET | Freq: Two times a day (BID) | ORAL | Status: DC
  Administered 2018-03-29: 25 mg via ORAL
  Filled 2018-03-29 (×2): qty 1

## 2018-03-29 MED ORDER — TRIAMCINOLONE ACETONIDE 0.025 % EX CREA
TOPICAL_CREAM | Freq: Three times a day (TID) | CUTANEOUS | Status: DC
  Administered 2018-03-29 – 2018-03-30 (×3): via TOPICAL
  Administered 2018-04-01: 1 via TOPICAL
  Administered 2018-04-01 – 2018-04-02 (×3): via TOPICAL
  Filled 2018-03-29: qty 15

## 2018-03-29 MED ORDER — LEVOTHYROXINE SODIUM 50 MCG PO TABS
50.0000 ug | ORAL_TABLET | Freq: Every day | ORAL | Status: DC
  Administered 2018-03-30 – 2018-04-02 (×4): 50 ug via ORAL
  Filled 2018-03-29 (×4): qty 1

## 2018-03-29 MED ORDER — PANTOPRAZOLE SODIUM 40 MG PO TBEC: 40.0000 mg | DELAYED_RELEASE_TABLET | Freq: Every day | ORAL | Status: DC

## 2018-03-29 MED ORDER — PREDNISONE 20 MG PO TABS
20.0000 mg | ORAL_TABLET | Freq: Two times a day (BID) | ORAL | Status: DC
  Administered 2018-03-29 – 2018-03-30 (×3): 20 mg via ORAL
  Filled 2018-03-29 (×4): qty 1

## 2018-03-29 MED ORDER — METOPROLOL TARTRATE 5 MG/5ML IV SOLN: 5.0000 mg | INTRAVENOUS | Status: DC | PRN

## 2018-03-29 MED ORDER — HYDRALAZINE HCL 50 MG PO TABS
25.0000 mg | ORAL_TABLET | Freq: Three times a day (TID) | ORAL | Status: DC
  Administered 2018-03-29: 25 mg via ORAL
  Filled 2018-03-29: qty 1

## 2018-03-29 MED ORDER — SODIUM CHLORIDE 0.9 % IV SOLN
Freq: Once | INTRAVENOUS | Status: AC
  Administered 2018-03-29: 11:00:00 via INTRAVENOUS

## 2018-03-29 NOTE — ED Triage Notes (Addendum)
Pt to ED via EMS from home with c/o increased weakness and pain to LFT hand, shoulder and leg x3days, pt is seen by in home PT and states increased exercises last week. PT hx of gout, swelling to LFT hand noted but is being treated by PCP. Pt A&Ox4, pt took tramadol for LFT hand and shoulder pain prior to EMS arrival. VSS . Speech clear

## 2018-03-29 NOTE — Telephone Encounter (Signed)
Tried to call Anderson Malta with [facility] to check on this patient's condition. Had to leave a message. Called the patient's home and daughter answered. States he is having fever, shoulder pain, multiple joint pains and unable to get out of bed. Concern of sepsis and advised her to call EMS to take him to the hospital since the family can't get him up and in the car.

## 2018-03-29 NOTE — Care Management Obs Status (Addendum)
[facility] NOTIFICATION   Patient Details  Name: Glenn Cozzolino Sr. MRN: [id number] Date of Birth: May 26, 1927   Medicare Observation Status Notification Given:  Yes  Patient not able to sign.  Daughter Glenn Jarvis "signs everything" for patient. She is to come to the unit later this evening.   4/4- have spoken with daughter Glenn Jarvis again about the need to sign obs notice. There are now two notices in the room with notice that signature is needed  Katrina Stack, RN 03/29/2018, 5:00 PM

## 2018-03-29 NOTE — ED Notes (Addendum)
     [place] Rounding  Assessment Alert;Family at bedside  Intervention Call light w/in reach;Stretcher locked in lowest position;Pain assessed, pt states no pain at this time. CARD pads still in place.

## 2018-03-29 NOTE — Telephone Encounter (Signed)
Left a message that I had called Glenn Jarvis house and spoke to his daughter about taking him to the ER by EMS since she did no feel the family could get him there. Wanted him assessed for sepsis since he was having significant pain and fever at home.

## 2018-03-29 NOTE — Care Management (Signed)
Patient is currently being followed by [facility] PT OT Aide and SW due to a fall in the home and limited mobility. Presented with weakness and concern for gout. Found to have variable heart rate up to 140.  Have requested PT and OT consults from attending

## 2018-03-29 NOTE — ED Notes (Signed)
This RN attempted report unable will continue to monitor.

## 2018-03-29 NOTE — ED Notes (Signed)
     Hourly Rounding  Assessment Alert;Vital signs STon monitor.   Intervention Call light w/in reach;Stretcher locked in lowest position;Pain assessed, Family at bedside.

## 2018-03-29 NOTE — ED Provider Notes (Signed)
[facility] Emergency Department Provider Note       Time seen: ----------------------------------------- 10:46 AM on 03/29/2018 -----------------------------------------   I have reviewed the triage vital signs and the nursing notes.  HISTORY   Chief Complaint Weakness    HPI Glenn Jarvis. is a 84 y.o. male with a history of iritis, GERD, gout, hyperlipidemia, hypertension and vertigo who presents to the ED for increased weakness and pain in the left hand, shoulder and leg for the last 3 days.  Patient was seen recently by home physical therapy and had increased physical therapy performed at home which the family is concerned may have increased his gout related pain.  Patient took tramadol for the left hand and shoulder prior to arrival.  There was reported fever at home, otherwise he denies chest pain, shortness of breath, vomiting or diarrhea.  Past Medical History:  Diagnosis Date  . Arthritis   . GERD (gastroesophageal reflux disease)   . Hemorrhoid 1986  . Hyperlipidemia   . Hypertension   . Thyroid disease   . Vertigo     Patient Active Problem List   Diagnosis Date Noted  . Syncope and collapse 11/24/2017  . Edema 10/05/2015  . Allergic rhinitis 07/05/2015  . Arthritis 07/05/2015  . Benign hypertension 07/05/2015  . Chronic kidney disease (CKD), stage III (moderate) ([place]) 07/05/2015  . Acid reflux 07/05/2015  . Gout 07/05/2015  . HLD (hyperlipidemia) 07/05/2015  . Adult hypothyroidism 07/05/2015  . Cannot sleep 07/05/2015  . Hernia, inguinal 07/05/2015  . LBP (low back pain) 07/05/2015  . Cramp in muscle 07/05/2015  . Leg paresthesia 07/05/2015  . CA of prostate ([place]) 07/05/2015  . Cutaneous eruption 07/05/2015  . Head revolving around 07/05/2015  . Hypothyroidism 07/03/2015  . Right inguinal hernia 05/30/2014    Past Surgical History:  Procedure Laterality Date  . APPENDECTOMY  1968  . BACK SURGERY    . COLONOSCOPY N/A  11/27/2017   Procedure: COLONOSCOPY;  Surgeon: Toledo, Benay Pike, MD;  Location: [facility] ENDOSCOPY;  Service: Gastroenterology;  Laterality: N/A;  . ESOPHAGOGASTRODUODENOSCOPY (EGD) WITH PROPOFOL N/A 11/25/2017   Procedure: ESOPHAGOGASTRODUODENOSCOPY (EGD) WITH PROPOFOL;  Surgeon: Toledo, Benay Pike, MD;  Location: [facility] ENDOSCOPY;  Service: Gastroenterology;  Laterality: N/A;  . EXCISIONAL HEMORRHOIDECTOMY    . INGUINAL HERNIA REPAIR Right 03/17/2016   Procedure: LAPAROSCOPIC INGUINAL HERNIA;  Surgeon: Florene Glen, MD;  Location: [facility] ORS;  Service: General;  Laterality: Right;  . SPINE SURGERY  1984    Allergies Codeine and Cortizone-10  [hydrocortisone]  Social History Social History   Tobacco Use  . Smoking status: Former Smoker    Packs/day: 2.00    Years: 25.00    Pack years: 50.00    Last attempt to quit: 12/28/1965    Years since quitting: 52.2  . Smokeless tobacco: Never Used  Substance Use Topics  . Alcohol use: No    Frequency: Never    Comment: 1 beer occasionally  . Drug use: No   Review of Systems Constitutional: Positive for reported fever Cardiovascular: Negative for chest pain. Respiratory: Negative for shortness of breath. Gastrointestinal: Negative for abdominal pain, vomiting and diarrhea. Musculoskeletal: Positive for left arm and shoulder pain Skin: Negative for rash. Neurological: Negative for headaches, focal weakness or numbness.  All systems negative/normal/unremarkable except as stated in the HPI  ____________________________________________   PHYSICAL EXAM:  VITAL SIGNS: ED Triage Vitals  Enc Vitals Group     BP 03/29/18 1003 (!) 140/91  Pulse Rate 03/29/18 1003 96     Resp 03/29/18 1003 16     Temp 03/29/18 1012 (!) 97.5 F (36.4 C)     Temp Source 03/29/18 1012 Axillary     SpO2 03/29/18 1003 97 %     Weight --      Height --      Head Circumference --      Peak Flow --      Pain Score 03/29/18 1004 10     Pain Loc --       Pain Edu? --      Excl. in [place]? --    Constitutional: Alert and oriented. Well appearing and in no distress. Eyes: Conjunctivae are normal. Normal extraocular movements. ENT   Head: Normocephalic and atraumatic.   Nose: No congestion/rhinnorhea.   Mouth/Throat: Mucous membranes are moist.   Neck: No stridor. Cardiovascular: Rapid rate, regular rhythm. No murmurs, rubs, or gallops. Respiratory: Normal respiratory effort without tachypnea nor retractions. Breath sounds are clear and equal bilaterally. No wheezes/rales/rhonchi. Gastrointestinal: Soft and nontender. Normal bowel sounds Musculoskeletal: The range of motion of the left upper extremity, obvious gouty arthropathy is noted Neurologic:  Normal speech and language. No gross focal neurologic deficits are appreciated.  Skin:  Skin is warm, dry and intact. No rash noted. Psychiatric: Mood and affect are normal. Speech and behavior are normal.  ____________________________________________  EKG: Interpreted by me.  Sinus rhythm with a rate of 94 bpm, probable LVH, borderline long QT, normal axis  Repeat EKG interpreted by me, SVT with a rate of 144 bpm, probable LVH, normal axis, normal QT.  Repeat EKG after Cardizem with sinus rhythm and a rate of 79 bpm, PVCs, normal axis, normal QT.  ____________________________________________  ED COURSE:  As part of my medical decision making, I reviewed the following data within the [place] History obtained from family if available, nursing notes, old chart and ekg, as well as notes from prior ED visits. Patient presented for weakness and pain that is increasing in the left arm, we will assess with labs and imaging as indicated at this time.  Patient was also found to be tachycardic on arrival.  We will give IV Cardizem and reevaluate.   Procedures ____________________________________________   LABS (pertinent positives/negatives)  Labs Reviewed  BASIC  METABOLIC PANEL - Abnormal; Notable for the following components:      Result Value   Sodium 134 (*)    Glucose, Bld 130 (*)    BUN 29 (*)    Creatinine, Ser 2.28 (*)    Calcium 8.4 (*)    GFR calc non Af Amer 24 (*)    GFR calc Af Amer 28 (*)    All other components within normal limits  CBC - Abnormal; Notable for the following components:   WBC 11.7 (*)    RBC 3.36 (*)    Hemoglobin 9.6 (*)    HCT 29.4 (*)    RDW 16.6 (*)    Platelets 97 (*)    All other components within normal limits  URINALYSIS, COMPLETE (UACMP) WITH MICROSCOPIC - Abnormal; Notable for the following components:   Color, Urine YELLOW (*)    APPearance CLEAR (*)    Hgb urine dipstick SMALL (*)    Protein, ur 30 (*)    All other components within normal limits  URIC ACID - Abnormal; Notable for the following components:   Uric Acid, Serum 8.5 (*)    All other components within  normal limits  TROPONIN I - Abnormal; Notable for the following components:   Troponin I 0.04 (*)    All other components within normal limits  CBG MONITORING, ED  ____________________________________________  DIFFERENTIAL DIAGNOSIS   Dehydration, electrolyte abnormality, gouty arthropathy, arrhythmia  FINAL ASSESSMENT AND PLAN  Weakness, gout, tachycardia   Plan: The patient had presented for weakness and increased left arm pain. Patient's labs reveal stable chronic abnormalities.  We have given him steroids for his gout particularly in the left arm.  He has required repeat dosing for intermittent tachycardia.  His heart rate seems to fluctuate from a normal sinus rhythm to a sinus tachycardia or SVT with a rate of 140 bpm.  Due to these findings and his weakness I will discussed with the hospitalist for observation on telemetry.   Laurence Aly, MD   Note: This note was generated in part or whole with voice recognition software. Voice recognition is usually quite accurate but there are transcription errors that can  and very often do occur. I apologize for any typographical errors that were not detected and corrected.     Earleen Newport, MD 03/29/18 1328

## 2018-03-29 NOTE — H&P (Signed)
[place] [facility] [place] NAME: Glenn Jarvis    MR#:  [id number]  DATE OF BIRTH:  10-21-1927  DATE OF ADMISSION:  03/29/2018  PRIMARY CARE PHYSICIAN: Margo Common, PA   REQUESTING/REFERRING PHYSICIAN: Dr. Jimmye Norman  CHIEF COMPLAINT:  Generalized weakness and palpitations  HISTORY OF PRESENT ILLNESS:  Glenn Jarvis  is a 84 y.o. male with a known history of gouty arthritis, essential hypertension, hyper lipidemia, hypothyroidism is presenting to the ED with a chief complaint of generalized weakness and palpitations and left wrist pain.  Patient was noticed to be in paroxysmal SVT in the ED and patient was given rate limiting drugs and hospitalist team is called to admit the patient.  During my examination patient denies any chest pain or palpitations.  PAST MEDICAL HISTORY:   Past Medical History:  Diagnosis Date  . Arthritis   . GERD (gastroesophageal reflux disease)   . Hemorrhoid 1986  . Hyperlipidemia   . Hypertension   . Thyroid disease   . Vertigo     PAST SURGICAL HISTOIRY:   Past Surgical History:  Procedure Laterality Date  . APPENDECTOMY  1968  . BACK SURGERY    . COLONOSCOPY N/A 11/27/2017   Procedure: COLONOSCOPY;  Surgeon: Toledo, Benay Pike, MD;  Location: [facility] END[facility]COPY;  Service: Gastroenterology;  Laterality: N/A;  . ESOPHAGOGASTRODUODENOSCOPY (EGD) WITH PROPOFOL N/A 11/25/2017   Procedure: ESOPHAGOGASTRODUODENOSCOPY (EGD) WITH PROPOFOL;  Surgeon: Toledo, Benay Pike, MD;  Location: [facility];  Service: Gastroenterology;  Laterality: N/A;  . EXCISIONAL HEMORRHOIDECTOMY    . INGUINAL HERNIA REPAIR Right 03/17/2016   Procedure: LAPAROSCOPIC INGUINAL HERNIA;  Surgeon: Florene Glen, MD;  Location: [facility] ORS;  Service: General;  Laterality: Right;  . SPINE SURGERY  1984    SOCIAL HISTORY:   Social History   Tobacco Use  . Smoking status: Former Smoker    Packs/day: 2.00    Years: 25.00    Pack  years: 50.00    Last attempt to quit: 12/28/1965    Years since quitting: 52.2  . Smokeless tobacco: Never Used  Substance Use Topics  . Alcohol use: No    Frequency: Never    Comment: 1 beer occasionally    FAMILY HISTORY:   Family History  Problem Relation Age of Onset  . Alzheimer's disease Father   . Healthy Sister     DRUG ALLERGIES:   Allergies  Allergen Reactions  . Codeine   . Cortizone-10  [Hydrocortisone]     REVIEW OF SYSTEMS:  CONSTITUTIONAL: No fever, fatigue or weakness.  EYES: No blurred or double vision.  EARS, NOSE, AND THROAT: No tinnitus or ear pain.  RESPIRATORY: No cough, shortness of breath, wheezing or hemoptysis.  CARDIOVASCULAR: No chest pain, orthopnea, edema.  GASTROINTESTINAL: No nausea, vomiting, diarrhea or abdominal pain.  GENITOURINARY: No dysuria, hematuria.  ENDOCRINE: No polyuria, nocturia,  HEMATOLOGY: No anemia, easy bruising or bleeding SKIN: No rash or lesion. MUSCULOSKELETAL: No joint pain or arthritis.  Reporting left wrist pain NEUROLOGIC: No tingling, numbness, weakness.  PSYCHIATRY: No anxiety or depression.   MEDICATIONS AT HOME:   Prior to Admission medications   Medication Sig Start Date End Date Taking? Authorizing Provider  colchicine 0.6 MG tablet Take 1 tablet (0.6 mg total) by mouth 2 (two) times daily. 12/31/17  Yes Chrismon, Vickki Muff, PA  dexlansoprazole (DEXILANT) 60 MG capsule Take 1 capsule (60 mg total) by mouth daily. 01/25/18  Yes Jonathon Bellows, MD  hydrALAZINE (APRESOLINE) 25 MG tablet Take 25 mg by mouth 3 (three) times daily.   Yes [provider]  levothyroxine (SYNTHROID, LEVOTHROID) 50 MCG tablet TAKE 1 TABLET BY MOUTH ONCE DAILY FOR THYROID, ON AN EMPTY STOMACH. WAIT 30 MINUTES BEFORE TAKING OTHER MEDS. 09/17/17  Yes Chrismon, Vickki Muff, PA  losartan (COZAAR) 100 MG tablet TAKE 1 TABLET BY MOUTH ONCE DAILY FOR BLOOD PRESSURE (HIGH) 09/17/17  Yes Chrismon, Vickki Muff, PA  metoprolol tartrate (LOPRESSOR)  25 MG tablet Take 0.5 tablets (12.5 mg total) by mouth 2 (two) times daily. 11/30/17  Yes Chrismon, Vickki Muff, PA  pantoprazole (PROTONIX) 40 MG tablet Take 1 tablet (40 mg total) by mouth daily. 01/01/18  Yes Chrismon, Vickki Muff, PA  traMADol (ULTRAM) 50 MG tablet Take 1 tablet (50 mg total) by mouth every 8 (eight) hours as needed. 03/03/18  Yes Chrismon, Vickki Muff, PA  triamcinolone (KENALOG) 0.025 % cream TRIAMCINOLONE ACETONIDE, 0.025% (External Cream)  1 (one) Cream Cream apply daily as needed for 0 days  Quantity: 60;  Refills: 0   Ordered :13-Apr-2014  [name] ;  Started 13-Apr-2014 Active Comments: Medication taken as needed.  04/13/14  Yes [provider]  furosemide (LASIX) 20 MG tablet TAKE 1 TABLET BY MOUTH ONCE DAILY FOR EDEMA/BP Patient not taking: Reported on 02/18/2018 12/31/17   Chrismon, Vickki Muff, PA  gabapentin (NEURONTIN) 100 MG capsule Take 1 capsule (100 mg total) by mouth 3 (three) times daily. Patient not taking: Reported on 01/21/2018 12/31/17   Chrismon, Vickki Muff, PA  predniSONE (DELTASONE) 10 MG tablet Take 1 tablet (10 mg total) by mouth daily with breakfast. Taper down down by one tablet daily (6,5,4,3,2,1) Patient not taking: Reported on 03/29/2018 02/18/18   Chrismon, Vickki Muff, PA      VITAL SIGNS:  Blood pressure (!) 144/69, pulse 68, temperature 97.8 F (36.6 C), temperature source Oral, resp. rate 18, height 6\' 1"  (1.854 m), weight 68 kg (150 lb), SpO2 98 %.  PHYSICAL EXAMINATION:  GENERAL:  84 y.o.-year-old patient lying in the bed with no acute distress.  EYES: Pupils equal, round, reactive to light and accommodation. No scleral icterus. Extraocular muscles intact.  HEENT: Head atraumatic, normocephalic. Oropharynx and nasopharynx clear.  NECK:  Supple, no jugular venous distention. No thyroid enlargement, no tenderness.  LUNGS: Normal breath sounds bilaterally, no wheezing, rales,rhonchi or crepitation. No use of accessory muscles of respiration.   CARDIOVASCULAR: S1, S2 normal. No murmurs, rubs, or gallops.  ABDOMEN: Soft, nontender, nondistended. Bowel sounds present. No organomegaly or mass.  EXTREMITIES: Left thumb area is erythematous and tender no pedal edema, cyanosis, or clubbing.  NEUROLOGIC: Cranial nerves II through XII are intact. Muscle strength 5/5 in all extremities except left wrist area sensation intact. Gait not checked.  PSYCHIATRIC: The patient is alert and oriented x 3.  SKIN: No obvious rash, lesion, or ulcer.   LABORATORY PANEL:   CBC Recent Labs  Lab 03/29/18 1016  WBC 11.7*  HGB 9.6*  HCT 29.4*  PLT 97*   ------------------------------------------------------------------------------------------------------------------  Chemistries  Recent Labs  Lab 03/29/18 1016  NA 134*  K 3.9  CL 102  CO2 24  GLUCOSE 130*  BUN 29*  CREATININE 2.28*  CALCIUM 8.4*   ------------------------------------------------------------------------------------------------------------------  Cardiac Enzymes Recent Labs  Lab 03/29/18 1215  TROPONINI 0.04*   ------------------------------------------------------------------------------------------------------------------  RADIOLOGY:  No results found.  EKG:   Orders placed or performed during the hospital encounter of 03/29/18  . EKG 12-Lead  .  EKG 12-Lead  . ED EKG  . ED EKG  . EKG 12-Lead  . EKG 12-Lead  . EKG 12-Lead  . EKG 12-Lead    IMPRESSION AND PLAN:     #Tachycardia secondary to paroxysmal SVT Monitor patient on telemetry Metoprolol 25 mg p.o. twice daily and titrate as needed Cardiology consulted Check TSH  #Acute gouty arthritis of the left  thumb IV Solu-Medrol x1 was given in the emergency department We will continue p.o. Prednisone Wrist splint Resume home medication colchicine  #Essential hypertension Cozaar-and metoprolol  #Generalized weakness-PT consult  #Hypothyroidism continue Synthyroid    All the records are  reviewed and case discussed with ED provider. Management plans discussed with the patient, family and they are in agreement.  CODE STATUS: fc   TOTAL TIME TAKING CARE OF THIS PATIENT: 42 minutes.   Note: This dictation was prepared with Dragon dictation along with smaller phrase technology. Any transcriptional errors that result from this process are unintentional.  Nicholes Mango M.D on 03/29/2018 at 5:57 PM  Between 7am to 6pm - Pager - [phone]  After 6pm go to [email or web address] - password [email or web address] [facility]  Office  [phone]  CC: Primary care physician; Margo Common, PA

## 2018-03-29 NOTE — Telephone Encounter (Signed)
Jennifre with [facility] called saying Mr Humbarger was in a lot of pain and not wanting to move this morning.  She advised daughter to call [phone].  This is just an FYI to let you know his condition.  Jennifers call back is [phone]   Thanks Con Memos

## 2018-03-29 NOTE — ED Notes (Signed)
Pt has brief run of HR 130-120 that lasted approx 1 min. Pt is not 99 in SR.

## 2018-03-29 NOTE — ED Notes (Signed)
Pt shows SVT on montior, EKG completed, MD Williams notified . Cardiac pads placed on pt at this time

## 2018-03-29 NOTE — Progress Notes (Signed)
Anticoagulation monitoring(Lovenox):  84 yo male ordered Lovenox 40 mg Q24h  Filed Weights   03/29/18 1432  Weight: 150 lb (68 kg)   BMI    Lab Results  Component Value Date   CREATININE 2.28 (H) 03/29/2018   CREATININE 3.13 (H) 12/31/2017   CREATININE 2.53 (H) 11/25/2017   Estimated Creatinine Clearance: 21.1 mL/min (A) (by C-G formula based on SCr of 2.28 mg/dL (H)). Hemoglobin & Hematocrit     Component Value Date/Time   HGB 9.6 (L) 03/29/2018 1016   HGB 9.5 (L) 12/31/2017 0954   HCT 29.4 (L) 03/29/2018 1016   HCT 28.3 (L) 12/31/2017 3582     Per Protocol for Patient with estCrcl < 30 ml/min and BMI < 40, will transition to Lovenox 30 mg Q24h.

## 2018-03-29 NOTE — Telephone Encounter (Signed)
Left message for patient to return call for a appt.

## 2018-03-29 NOTE — Telephone Encounter (Signed)
Anderson Malta is returning your call from [place].  [phone]

## 2018-03-29 NOTE — Consult Note (Signed)
Cardiology Consultation:   Patient ID: Marcellis Frampton Sr.; [id number]; 1927-09-05   Admit date: 03/29/2018 Date of Consult: 03/29/2018  Primary Care Provider: Margo Common, PA Primary Cardiologist: new  Patient Profile:   Vonnie Ligman Sr. is a 84 y.o. male with a hx of chronic kidney disease, GERD, gout, hyperlipidemia, hypertension and vertigo who is being seen today for the evaluation of suspected supraventricular tachycardia at the request of Dr. Margaretmary Eddy.  History of Present Illness:   Mr. Sciandra is an 84 year old male with the above medical listed problems who presented with severe left arm and hand pain and swelling likely related to gout.  While in the emergency department, he developed tachycardia with a heart rate of 144 bpm that improved with IV diltiazem.  The EKG was suggestive of supraventricular tachycardia.  The patient denies any previous cardiac history.  He denies any chest pain, shortness of breath or palpitations.  No dizziness, syncope or presyncope.  The patient was started on prednisone for suspected gout.  He reports improvement in symptoms.  Past Medical History:  Diagnosis Date  . Arthritis   . GERD (gastroesophageal reflux disease)   . Hemorrhoid 1986  . Hyperlipidemia   . Hypertension   . Thyroid disease   . Vertigo     Past Surgical History:  Procedure Laterality Date  . APPENDECTOMY  1968  . BACK SURGERY    . COLONOSCOPY N/A 11/27/2017   Procedure: COLONOSCOPY;  Surgeon: Toledo, Benay Pike, MD;  Location: [facility] ENDOSCOPY;  Service: Gastroenterology;  Laterality: N/A;  . ESOPHAGOGASTRODUODENOSCOPY (EGD) WITH PROPOFOL N/A 11/25/2017   Procedure: ESOPHAGOGASTRODUODENOSCOPY (EGD) WITH PROPOFOL;  Surgeon: Toledo, Benay Pike, MD;  Location: [facility] ENDOSCOPY;  Service: Gastroenterology;  Laterality: N/A;  . EXCISIONAL HEMORRHOIDECTOMY    . INGUINAL HERNIA REPAIR Right 03/17/2016   Procedure: LAPAROSCOPIC INGUINAL HERNIA;  Surgeon: Florene Glen, MD;   Location: [facility] ORS;  Service: General;  Laterality: Right;  . SPINE SURGERY  1984     Home Medications:  Prior to Admission medications   Medication Sig Start Date End Date Taking? Authorizing Provider  colchicine 0.6 MG tablet Take 1 tablet (0.6 mg total) by mouth 2 (two) times daily. 12/31/17  Yes Chrismon, Vickki Muff, PA  dexlansoprazole (DEXILANT) 60 MG capsule Take 1 capsule (60 mg total) by mouth daily. 01/25/18  Yes Jonathon Bellows, MD  hydrALAZINE (APRESOLINE) 25 MG tablet Take 25 mg by mouth 3 (three) times daily.   Yes [provider]  levothyroxine (SYNTHROID, LEVOTHROID) 50 MCG tablet TAKE 1 TABLET BY MOUTH ONCE DAILY FOR THYROID, ON AN EMPTY STOMACH. WAIT 30 MINUTES BEFORE TAKING OTHER MEDS. 09/17/17  Yes Chrismon, Vickki Muff, PA  losartan (COZAAR) 100 MG tablet TAKE 1 TABLET BY MOUTH ONCE DAILY FOR BLOOD PRESSURE (HIGH) 09/17/17  Yes Chrismon, Vickki Muff, PA  metoprolol tartrate (LOPRESSOR) 25 MG tablet Take 0.5 tablets (12.5 mg total) by mouth 2 (two) times daily. 11/30/17  Yes Chrismon, Vickki Muff, PA  pantoprazole (PROTONIX) 40 MG tablet Take 1 tablet (40 mg total) by mouth daily. 01/01/18  Yes Chrismon, Vickki Muff, PA  traMADol (ULTRAM) 50 MG tablet Take 1 tablet (50 mg total) by mouth every 8 (eight) hours as needed. 03/03/18  Yes Chrismon, Vickki Muff, PA  triamcinolone (KENALOG) 0.025 % cream TRIAMCINOLONE ACETONIDE, 0.025% (External Cream)  1 (one) Cream Cream apply daily as needed for 0 days  Quantity: 60;  Refills: 0   Ordered :13-Apr-2014  [name] ;  Started 13-Apr-2014  Active Comments: Medication taken as needed.  04/13/14  Yes [provider]  furosemide (LASIX) 20 MG tablet TAKE 1 TABLET BY MOUTH ONCE DAILY FOR EDEMA/BP Patient not taking: Reported on 02/18/2018 12/31/17   Chrismon, Vickki Muff, PA  gabapentin (NEURONTIN) 100 MG capsule Take 1 capsule (100 mg total) by mouth 3 (three) times daily. Patient not taking: Reported on 01/21/2018 12/31/17   Chrismon, Vickki Muff, PA   predniSONE (DELTASONE) 10 MG tablet Take 1 tablet (10 mg total) by mouth daily with breakfast. Taper down down by one tablet daily (6,5,4,3,2,1) Patient not taking: Reported on 03/29/2018 02/18/18   Chrismon, Vickki Muff, PA    Inpatient Medications: Scheduled Meds: . colchicine  0.6 mg Oral BID  . enoxaparin (LOVENOX) injection  30 mg Subcutaneous Q24H  . furosemide  20 mg Oral Daily  . gabapentin  100 mg Oral TID  . [START ON 03/30/2018] levothyroxine  50 mcg Oral QAC breakfast  . [START ON 03/30/2018] losartan  100 mg Oral Daily  . metoprolol tartrate  25 mg Oral BID  . [START ON 03/30/2018] pantoprazole  40 mg Oral Daily  . [START ON 03/30/2018] pantoprazole  40 mg Oral Daily  . predniSONE  20 mg Oral BID WC  . triamcinolone   Topical TID   Continuous Infusions:  PRN Meds: acetaminophen **OR** acetaminophen, metoprolol tartrate, ondansetron **OR** ondansetron (ZOFRAN) IV, traMADol  Allergies:    Allergies  Allergen Reactions  . Codeine   . Cortizone-10  [Hydrocortisone]     Social History:   Social History   Socioeconomic History  . Marital status: Widowed    Spouse name: Not on file  . Number of children: Not on file  . Years of education: Not on file  . Highest education level: Not on file  Occupational History  . Not on file  Social Needs  . Financial resource strain: Not on file  . Food insecurity:    Worry: Not on file    Inability: Not on file  . Transportation needs:    Medical: Not on file    Non-medical: Not on file  Tobacco Use  . Smoking status: Former Smoker    Packs/day: 2.00    Years: 25.00    Pack years: 50.00    Last attempt to quit: 12/28/1965    Years since quitting: 52.2  . Smokeless tobacco: Never Used  Substance and Sexual Activity  . Alcohol use: No    Frequency: Never    Comment: 1 beer occasionally  . Drug use: No  . Sexual activity: Never  Lifestyle  . Physical activity:    Days per week: Not on file    Minutes per session: Not on  file  . Stress: Not on file  Relationships  . Social connections:    Talks on phone: Not on file    Gets together: Not on file    Attends religious service: Not on file    Active member of club or organization: Not on file    Attends meetings of clubs or organizations: Not on file    Relationship status: Not on file  . Intimate partner violence:    Fear of current or ex partner: Not on file    Emotionally abused: Not on file    Physically abused: Not on file    Forced sexual activity: Not on file  Other Topics Concern  . Not on file  Social History Narrative  . Not on file    Family  History:    Family History  Problem Relation Age of Onset  . Alzheimer's disease Father   . Healthy Sister      ROS:  Please see the history of present illness.   All other ROS reviewed and negative.     Physical Exam/Data:   Vitals:   03/29/18 1445 03/29/18 1500 03/29/18 1539 03/29/18 1716  BP:  (!) 149/95 (!) 167/95 (!) 144/69  Pulse:   78 68  Resp: 13 13 18    Temp:   97.6 F (36.4 C) 97.8 F (36.6 C)  TempSrc:   Oral Oral  SpO2:  99% 100% 98%  Weight:      Height:        Intake/Output Summary (Last 24 hours) at 03/29/2018 1748 Last data filed at 03/29/2018 1219 Gross per 24 hour  Intake 383.33 ml  Output -  Net 383.33 ml   Filed Weights   03/29/18 1432  Weight: 150 lb (68 kg)   Body mass index is 19.79 kg/m.  General:  Well nourished, well developed, in no acute distress HEENT: normal Lymph: no adenopathy Neck: no JVD Endocrine:  No thryomegaly Vascular: No carotid bruits; FA pulses 2+ bilaterally without bruits  Cardiac:  normal S1, S2; RRR; no murmur  Lungs:  clear to auscultation bilaterally, no wheezing, rhonchi or rales  Abd: soft, nontender, no hepatomegaly  Ext: no edema Musculoskeletal: Left hand swelling with some warmth. Skin: warm and dry  Neuro:  CNs 2-12 intact, no focal abnormalities noted Psych:  Normal affect   EKG:  The EKG was personally  reviewed and demonstrates: Sinus rhythm with LVH and repolarization abnormalities.  One EKG showed narrow complex tachycardia suggestive of SVT at a heart rate of 144 bpm. Telemetry:  Telemetry was personally reviewed and demonstrates: Normal sinus rhythm.  Relevant CV Studies:   Laboratory Data:  Chemistry Recent Labs  Lab 03/29/18 1016  NA 134*  K 3.9  CL 102  CO2 24  GLUCOSE 130*  BUN 29*  CREATININE 2.28*  CALCIUM 8.4*  GFRNONAA 24*  GFRAA 28*  ANIONGAP 8    No results for input(s): PROT, ALBUMIN, AST, ALT, ALKPHOS, BILITOT in the last 168 hours. Hematology Recent Labs  Lab 03/29/18 1016  WBC 11.7*  RBC 3.36*  HGB 9.6*  HCT 29.4*  MCV 87.3  MCH 28.6  MCHC 32.7  RDW 16.6*  PLT 97*   Cardiac Enzymes Recent Labs  Lab 03/29/18 1215  TROPONINI 0.04*   No results for input(s): TROPIPOC in the last 168 hours.  BNPNo results for input(s): BNP, PROBNP in the last 168 hours.  DDimer No results for input(s): DDIMER in the last 168 hours.  Radiology/Studies:  No results found.  Assessment and Plan:   1. Intermittent tachycardia: Likely paroxysmal supraventricular tachycardia.  This is in the setting of severe left arm and hand pain due to suspected gout.  The patient does not appear to be symptomatic from this.  I elected to increase metoprolol to 25 mg twice daily and discontinued hydralazine.  Given the patient has minimal symptoms related to this, I do not think he requires further workup. 2. Borderline elevated troponin at 0.04: Likely some degree of demand ischemia given tachycardia.  He has no anginal symptoms and given his age and underlying chronic kidney disease, I do not recommend further cardiac workup.   For questions or updates, please contact [place] Please consult [email or web address] for contact info under Cardiology/STEMI.   Signed, Kathlyn Sacramento, MD  03/29/2018 5:48 PM

## 2018-03-29 NOTE — Progress Notes (Signed)
Family Meeting Note  Advance Directive:yes  Today a meeting took place with the Patient.     The following clinical team members were present during this meeting:MD  The following were discussed:Patient's diagnosis: Acute gout, generalized weakness, paroxysmal SVT, essential hypertension, treatment plan of care discussed in detail with the patient and her family members at bedside.  They verbalized understanding of the plan.  Patient's progosis: Unable to determine and Goals for treatment: Full Code, daughter healthcare power of attorney  Additional follow-up to be provided: Hospitalist and cardiology  Time spent during discussion:17 min  Nicholes Mango, MD

## 2018-03-30 DIAGNOSIS — M109 Gout, unspecified: Secondary | ICD-10-CM | POA: Diagnosis not present

## 2018-03-30 DIAGNOSIS — I10 Essential (primary) hypertension: Secondary | ICD-10-CM | POA: Diagnosis not present

## 2018-03-30 DIAGNOSIS — E039 Hypothyroidism, unspecified: Secondary | ICD-10-CM | POA: Diagnosis not present

## 2018-03-30 DIAGNOSIS — I471 Supraventricular tachycardia: Secondary | ICD-10-CM | POA: Diagnosis not present

## 2018-03-30 LAB — COMPREHENSIVE METABOLIC PANEL
ALT: 16 U/L — ABNORMAL LOW (ref 17–63)
AST: 30 U/L (ref 15–41)
Albumin: 2.5 g/dL — ABNORMAL LOW (ref 3.5–5.0)
Alkaline Phosphatase: 104 U/L (ref 38–126)
Anion gap: 8 (ref 5–15)
BUN: 37 mg/dL — ABNORMAL HIGH (ref 6–20)
CHLORIDE: 103 mmol/L (ref 101–111)
CO2: 24 mmol/L (ref 22–32)
CREATININE: 2.29 mg/dL — AB (ref 0.61–1.24)
Calcium: 8.3 mg/dL — ABNORMAL LOW (ref 8.9–10.3)
GFR, EST AFRICAN AMERICAN: 27 mL/min — AB (ref >60)
GFR, EST NON AFRICAN AMERICAN: 24 mL/min — AB (ref >60)
Glucose, Bld: 129 mg/dL — ABNORMAL HIGH (ref 65–99)
POTASSIUM: 4.7 mmol/L (ref 3.5–5.1)
Sodium: 135 mmol/L (ref 135–145)
Total Bilirubin: 0.8 mg/dL (ref 0.3–1.2)
Total Protein: 6.5 g/dL (ref 6.5–8.1)

## 2018-03-30 LAB — CBC
HCT: 31.2 % — ABNORMAL LOW (ref 40.0–52.0)
Hemoglobin: 10.4 g/dL — ABNORMAL LOW (ref 13.0–18.0)
MCH: 28.8 pg (ref 26.0–34.0)
MCHC: 33.4 g/dL (ref 32.0–36.0)
MCV: 86.3 fL (ref 80.0–100.0)
PLATELETS: 106 10*3/uL — AB (ref 150–440)
RBC: 3.61 MIL/uL — ABNORMAL LOW (ref 4.40–5.90)
RDW: 16.8 % — AB (ref 11.5–14.5)
WBC: 10.4 10*3/uL (ref 3.8–10.6)

## 2018-03-30 MED ORDER — ALLOPURINOL 100 MG PO TABS
50.0000 mg | ORAL_TABLET | Freq: Every day | ORAL | Status: DC
  Administered 2018-03-31 – 2018-04-02 (×3): 50 mg via ORAL
  Filled 2018-03-30 (×3): qty 0.5

## 2018-03-30 MED ORDER — METOPROLOL TARTRATE 25 MG PO TABS
37.5000 mg | ORAL_TABLET | Freq: Two times a day (BID) | ORAL | Status: DC
  Administered 2018-03-30: 37.5 mg via ORAL
  Filled 2018-03-30: qty 2

## 2018-03-30 MED ORDER — METOPROLOL TARTRATE 25 MG PO TABS
12.5000 mg | ORAL_TABLET | Freq: Once | ORAL | Status: AC
  Administered 2018-03-30: 12.5 mg via ORAL
  Filled 2018-03-30: qty 1

## 2018-03-30 MED ORDER — MAGNESIUM SULFATE 2 GM/50ML IV SOLN
2.0000 g | Freq: Once | INTRAVENOUS | Status: AC
  Administered 2018-03-30: 2 g via INTRAVENOUS
  Filled 2018-03-30: qty 50

## 2018-03-30 NOTE — Evaluation (Signed)
Physical Therapy Evaluation Patient Details Name: Glenn Ramsburg Sr. MRN: [id number] DOB: [birth year] Today's Date: 03/30/2018   History of Present Illness  Pt is an 84 y.o. male presenting to hospital 03/29/18 with increased weakness and pain in L hand/shoulder/leg x3 days; pt also noted to be tachy with HR 144 bpm).  Pt admitted with gout and likely paroxysmal SVT.  PMH includes gout, htn, vertigo, back surgery, CKD.  Clinical Impression  Prior to hospital admission, pt was most recently ambulating with RW and receiving HHPT (pt reports recent fall and being unsteady without AD but pt reports improved balance since use of RW at home).  Pt lives alone in 1 level home with steps to enter.  Currently pt is SBA supine to sit; min assist to CGA with transfers using RW; and CGA ambulating 40 feet with RW.  Pt initially unsteady with RW with very narrow BOS requiring assist to steady; improved steadiness noted 2nd attempt at ambulation but pt antalgic with decreased stance time L LE (pt c/o L heel pain with WB'ing but pt did not rate when asked multiple times; pt reporting no pain in L wrist with use of L wrist splint during session but pt also noted to not use L UE a lot--anticipate d/t concerns of pain).  Pt would benefit from skilled PT to address noted impairments and functional limitations (see below for any additional details).  Upon hospital discharge, recommend pt discharge to home with HHPT and use of RW.    Follow Up Recommendations Home health PT    Equipment Recommendations  Rolling walker with 5" wheels    Recommendations for Other Services OT consult     Precautions / Restrictions Precautions Precautions: Fall Required Braces or Orthoses: Other Brace/Splint Other Brace/Splint: L wrist splint Restrictions Weight Bearing Restrictions: No      Mobility  Bed Mobility Overal bed mobility: Needs Assistance Bed Mobility: Supine to Sit     Supine to sit: Supervision;HOB elevated      General bed mobility comments: Increased effort and time to perform on own.  Transfers Overall transfer level: Needs assistance Equipment used: Rolling walker (2 wheeled) Transfers: Sit to/from [facility] Sit to Stand: Min assist;Min guard Stand pivot transfers: Min assist       General transfer comment: min assist to stand from bed; CGA to stand from chair; vc's required to scoot to edge of sitting surface, LE and UE placement, and "nose over toes" technique for standing; pt with very narrow BOS taking steps bed to recliner requiring assist to steady  Ambulation/Gait Ambulation/Gait assistance: Min guard Ambulation Distance (Feet): 40 Feet Assistive device: Rolling walker (2 wheeled)   Gait velocity: decreased   General Gait Details: antalgic; decreased stance time L LE (pt c/o L heel pain), step to gait pattern; improved steadiness with distance ambulated  Stairs            Wheelchair Mobility    Modified Rankin (Stroke Patients Only)       Balance Overall balance assessment: Needs assistance;History of Falls Sitting-balance support: No upper extremity supported;Feet supported Sitting balance-Leahy Scale: Normal Sitting balance - Comments: steady sitting reaching within BOS   Standing balance support: Bilateral upper extremity supported Standing balance-Leahy Scale: Poor Standing balance comment: requires B UE support on RW for steady ambulation                             Pertinent Vitals/Pain Pain  Assessment: Faces Faces Pain Scale: Hurts even more(L heel with ambulation (none at rest)) Pain Location: L heel Pain Descriptors / Indicators: Grimacing;Guarding Pain Intervention(s): Limited activity within patient's tolerance;Monitored during session;Repositioned     Home Living Family/patient expects to be discharged to:: Private residence Living Arrangements: Alone Available Help at Discharge: Family(Family checks in on  pt) Type of Home: House Home Access: Stairs to enter   [employer] of Steps: 3 steps with L railing from front; 4 steps with B railing from back entrance Home Layout: One level Home Equipment: Walker - 2 wheels;Grab bars - tub/shower;Grab bars - toilet      Prior Function Level of Independence: Independent with assistive device(s)         Comments: Pt reports recently using RW d/t unsteadiness and recent fall; also receiving HHPT.     Hand Dominance        Extremity/Trunk Assessment   Upper Extremity Assessment Upper Extremity Assessment: Generalized weakness(Deferred L wrist (pt reporting no pain with splint donned))    Lower Extremity Assessment Lower Extremity Assessment: Generalized weakness    Cervical / Trunk Assessment Cervical / Trunk Assessment: Normal  Communication   Communication: HOH  Cognition Arousal/Alertness: Awake/alert Behavior During Therapy: WFL for tasks assessed/performed Overall Cognitive Status: Within Functional Limits for tasks assessed                                        General Comments General comments (skin integrity, edema, etc.): Pt resting in bed upon PT arrival.  L wrist splint donned prior to OOB activity.  Nursing cleared pt for participation in physical therapy.  Pt agreeable to PT session.    Exercises  Transfer and gait training.   Assessment/Plan    PT Assessment Patient needs continued PT services  PT Problem List Decreased strength;Decreased activity tolerance;Decreased balance;Decreased mobility;Decreased knowledge of use of DME;Decreased knowledge of precautions;Pain       PT Treatment Interventions DME instruction;Gait training;Stair training;Functional mobility training;Therapeutic activities;Therapeutic exercise;Balance training;Patient/family education    PT Goals (Current goals can be found in the Care Plan section)  Acute Rehab PT Goals Patient Stated Goal: to improve  strength PT Goal Formulation: With patient Time For Goal Achievement: 04/13/18 Potential to Achieve Goals: Good    Frequency Min 2X/week   Barriers to discharge        Co-evaluation               AM-PAC PT "6 Clicks" Daily Activity  Outcome Measure Difficulty turning over in bed (including adjusting bedclothes, sheets and blankets)?: None Difficulty moving from lying on back to sitting on the side of the bed? : A Little Difficulty sitting down on and standing up from a chair with arms (e.g., wheelchair, bedside commode, etc,.)?: Unable Help needed moving to and from a bed to chair (including a wheelchair)?: A Little Help needed walking in hospital room?: A Little Help needed climbing 3-5 steps with a railing? : A Little 6 Click Score: 17    End of Session Equipment Utilized During Treatment: Gait belt;Other (comment)(L wrist splint) Activity Tolerance: Patient limited by pain Patient left: in chair;with call bell/phone within reach;with chair alarm set Nurse Communication: Mobility status;Precautions PT Visit Diagnosis: Unsteadiness on feet (R26.81);Other abnormalities of gait and mobility (R26.89);Muscle weakness (generalized) (M62.81);History of falling (Z91.81);Pain Pain - Right/Left: Left Pain - part of body: Ankle and joints of foot  Time: 1210-1240 PT Time Calculation (min) (ACUTE ONLY): 30 min   Charges:   PT Evaluation $PT Eval Low Complexity: 1 Low PT Treatments $Therapeutic Activity: 8-22 mins   PT G CodesLeitha Bleak, PT 03/30/18, 2:20 PM [phone]

## 2018-03-30 NOTE — Progress Notes (Signed)
Patient ID: Glenn Jarvis., male   DOB: 1927-07-16, 84 y.o.   MRN: [id number]  [facility] PROGRESS NOTE  Daxen Lanum Sr. IOE:[id number] DOB: 05/11/1927 DOA: 03/29/2018 PCP: Margo Common, PA  HPI/Subjective: Patient presented with gout pain in his left ankle and left hand.  Unable to move his hand and his left ankle very well.  Patient walked with physical therapy and was very ginger on his left ankle and could not grip well with his left hand.  Objective: Vitals:   03/30/18 1000 03/30/18 1523  BP:  (!) 114/93  Pulse: 62 (!) 57  Resp:  17  Temp:    SpO2:  97%    Filed Weights   03/29/18 1432  Weight: 68 kg (150 lb)    ROS: Review of Systems  Constitutional: Negative for chills and fever.  Eyes: Negative for blurred vision.  Respiratory: Negative for cough and shortness of breath.   Cardiovascular: Negative for chest pain.  Gastrointestinal: Negative for abdominal pain, constipation, diarrhea, nausea and vomiting.  Genitourinary: Negative for dysuria.  Musculoskeletal: Positive for joint pain.  Neurological: Negative for dizziness and headaches.   Exam: Physical Exam  HENT:  Nose: No mucosal edema.  Mouth/Throat: No oropharyngeal exudate or posterior oropharyngeal edema.  Eyes: Pupils are equal, round, and reactive to light. Conjunctivae, EOM and lids are normal.  Neck: No JVD present. Carotid bruit is not present. No edema present. No thyroid mass and no thyromegaly present.  Cardiovascular: S1 normal and S2 normal. Exam reveals no gallop.  No murmur heard. Pulses:      Dorsalis pedis pulses are 2+ on the right side, and 2+ on the left side.  Respiratory: No respiratory distress. He has no wheezes. He has no rhonchi. He has no rales.  GI: Soft. Bowel sounds are normal. There is no tenderness.  Musculoskeletal:       Left wrist: He exhibits decreased range of motion, tenderness and swelling.       Right ankle: He exhibits swelling.       Left ankle: He  exhibits decreased range of motion and swelling. Tenderness.  Lymphadenopathy:    He has no cervical adenopathy.  Neurological: He is alert. No cranial nerve deficit.  Skin: Skin is warm. No rash noted. Nails show no clubbing.  Psychiatric: He has a normal mood and affect.      Data Reviewed: Basic Metabolic Panel: Recent Labs  Lab 03/29/18 1016 03/30/18 0424  NA 134* 135  K 3.9 4.7  CL 102 103  CO2 24 24  GLUCOSE 130* 129*  BUN 29* 37*  CREATININE 2.28* 2.29*  CALCIUM 8.4* 8.3*   Liver Function Tests: Recent Labs  Lab 03/30/18 0424  AST 30  ALT 16*  ALKPHOS 104  BILITOT 0.8  PROT 6.5  ALBUMIN 2.5*   CBC: Recent Labs  Lab 03/29/18 1016 03/30/18 0424  WBC 11.7* 10.4  HGB 9.6* 10.4*  HCT 29.4* 31.2*  MCV 87.3 86.3  PLT 97* 106*   Cardiac Enzymes: Recent Labs  Lab 03/29/18 1215  TROPONINI 0.04*    Scheduled Meds: . [START ON 03/31/2018] allopurinol  50 mg Oral Daily  . colchicine  0.6 mg Oral BID  . enoxaparin (LOVENOX) injection  30 mg Subcutaneous Q24H  . gabapentin  100 mg Oral TID  . levothyroxine  50 mcg Oral QAC breakfast  . losartan  100 mg Oral Daily  . metoprolol tartrate  37.5 mg Oral BID  . pantoprazole  40 mg Oral Daily  . predniSONE  20 mg Oral BID WC  . sodium chloride flush  3 mL Intravenous Q12H  . triamcinolone   Topical TID   Continuous Infusions:  Assessment/Plan:  1. Polyarticular gout.  On low-dose prednisone.  And allopurinol starting tomorrow.  Continue colchicine. 2. SVT on metoprolol. 3. Hypothyroidism unspecified on levothyroxine 4. Essential hypertension on losartan and metoprolol.   5. Neuropathy on gabapentin. 6. Physical therapy wanted to walk with the patient 1 more day prior to disposition.  Code Status:     Code Status Orders  (From admission, onward)        Start     Ordered   03/29/18 1534  Full code  Continuous     03/29/18 1534    Code Status History    Date Active Date Inactive Code Status  Order ID Comments User Context   11/24/2017 0139 11/27/2017 1950 Full Code [id number]  Gorden Harms, MD Inpatient   03/17/2016 0233 03/18/2016 2027 Full Code [id number]  Hubbard Robinson, MD ED    Advance Directive Documentation     Most Recent Value  Type of Advance Directive  Healthcare Power of Attorney  Pre-existing out of facility DNR order (yellow form or pink MOST form)  -  "MOST" Form in Place?  -                                                 Hopefully home tomorrow Disposition Plan:   Time spent: 25 minutes  [place]

## 2018-03-30 NOTE — Evaluation (Signed)
Occupational Therapy Evaluation Patient Details Name: Glenn Kun Sr. MRN: [id number] DOB: September 05, 1927 Today's Date: 03/30/2018    History of Present Illness Pt is an 84 y.o. male presenting to hospital 03/29/18 with increased weakness and pain in L hand/shoulder/leg x3 days; pt also noted to be tachy with HR 144 bpm).  Pt admitted with gout and likely paroxysmal SVT.  PMH includes gout, htn, vertigo, back surgery, CKD.   Clinical Impression   Pt seen for OT evaluation this date. Prior to hospital admission, pt was independent and active, enjoying doing yard work for his home and for his daughter.  Pt lives by himself in a 1 story home with steps and rails. Pt has grab bars in the bathroom/shower, has a shower chair, and primarily uses a rollator for mobility, per pt report. 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 Pt notes improvement in pain and swelling with wrist brace in place. Pt instructed in joint protection and skin protection strategies to minimize skin breakdown or further injury, including wear schedule for wrist brace. Pt verbalized understanding.  Pt would benefit from skilled OT to address noted impairments and functional limitations (see below for any additional details).  Upon hospital discharge, recommend pt discharge to home with HHOT to maximize safety, minimize risk of future falls, and minimize caregiver burden.    Follow Up Recommendations  Home health OT    Equipment Recommendations  None recommended by OT    Recommendations for Other Services       Precautions / Restrictions Precautions Precautions: Fall Required Braces or Orthoses: Other Brace/Splint Other Brace/Splint: L wrist splint Restrictions Weight Bearing Restrictions: No      Mobility Bed Mobility     General bed mobility comments: deferred, pt in recliner  Transfers Overall transfer level: Needs assistance Equipment used:  Rolling walker (2 wheeled) Transfers: Sit to/from Stand Sit to Stand: Min guard Stand pivot transfers: Min assist          Balance Overall balance assessment: Needs assistance;History of Falls Sitting-balance support: No upper extremity supported;Feet supported Sitting balance-Leahy Scale: Normal Sitting balance - Comments: steady sitting reaching within BOS   Standing balance support: Bilateral upper extremity supported Standing balance-Leahy Scale: Poor Standing balance comment: requires B UE support on RW for steady ambulation                           ADL either performed or assessed with clinical judgement   ADL Overall ADL's : Needs assistance/impaired Eating/Feeding: Sitting;Independent   Grooming: Standing;Min guard   Upper Body Bathing: Sitting;Minimal assistance;Set up   Lower Body Bathing: Sit to/from stand;Minimal assistance   Upper Body Dressing : Sitting;Minimal assistance   Lower Body Dressing: Sit to/from stand;Minimal assistance   Toilet Transfer: RW;Min guard;Comfort height toilet;Grab bars   Toileting- Clothing Manipulation and Hygiene: Independent       Functional mobility during ADLs: Min guard;Rolling walker       Vision Baseline Vision/History: Wears glasses Wears Glasses: Reading only Patient Visual Report: No change from baseline Vision Assessment?: No apparent visual deficits     Perception     Praxis      Pertinent Vitals/Pain Pain Assessment: No/denies pain Pain Intervention(s): Limited activity within patient's tolerance;Monitored during session     Hand Dominance Right   Extremity/Trunk Assessment Upper Extremity Assessment Upper Extremity Assessment: Generalized weakness;LUE deficits/detail LUE Deficits / Details: pain at  L wrist that radiates through hand/fingers with more than light AROM, otherwise strength grossly 4/5 bilaterally, L wrist brace on which pt notes has improved comfort and mild swelling to  LUE LUE: Unable to fully assess due to pain LUE Coordination: decreased fine motor   Lower Extremity Assessment Lower Extremity Assessment: Generalized weakness   Cervical / Trunk Assessment Cervical / Trunk Assessment: Normal   Communication Communication Communication: HOH   Cognition Arousal/Alertness: Awake/alert Behavior During Therapy: WFL for tasks assessed/performed Overall Cognitive Status: Within Functional Limits for tasks assessed                                     General Comments  Inspected LUE skin for breakdown, skin clean and dry no breakdown noted    Exercises Other Exercises Other Exercises: Pt instructed in gently AROM execises to perform when out of the L wrist brace/splint, pt verbalized and demo'd understanding Other Exercises: Pt instructed in joint protection and skin protection strategies to minimize skin breakdown or further injury, including wear schedule for wrist brace. Pt verbalized understanding.   Shoulder Instructions      Home Living Family/patient expects to be discharged to:: Private residence Living Arrangements: Alone Available Help at Discharge: Family;Available PRN/intermittently(family checks in on pt) Type of Home: House Home Access: Stairs to enter [employer] of Steps: 3 steps with L railing from front; 4 steps with B railing from back entrance   Home Layout: One level     Bathroom Shower/Tub: Teacher, early years/pre: Handicapped height     Home Equipment: Walker - 2 wheels;Grab bars - tub/shower;Grab bars - toilet;Other (comment);Shower seat(rollator)   Additional Comments: grab bar in shower is suction, pt notes he checks suction prior to each use      Prior Functioning/Environment Level of Independence: Independent with assistive device(s)        Comments: Pt reports recently using RW d/t unsteadiness and recent fall (no other falls); also receiving HHPT. Indep with ADL, med mgt         OT Problem List: Decreased strength;Impaired UE functional use      OT Treatment/Interventions: Self-care/ADL training;Therapeutic exercise;Therapeutic activities;DME and/or AE instruction;Patient/family education    OT Goals(Current goals can be found in the care plan section) Acute Rehab OT Goals Patient Stated Goal: to improve strength OT Goal Formulation: With patient Time For Goal Achievement: 04/13/18 Potential to Achieve Goals: Good ADL Goals Pt Will Perform Grooming: with modified independence Additional ADL Goal #1: Pt will perform ROM exercises for LUE to prevent loss of ROM and strength 3x/day Additional ADL Goal #2: Pt will verbalize splint wear schedule and care to maximize safety and skin integrity to minimize risk of skin breakdown.  OT Frequency: Min 1X/week   Barriers to D/C:            Co-evaluation              AM-PAC PT "6 Clicks" Daily Activity     Outcome Measure Help from another person eating meals?: None Help from another person taking care of personal grooming?: None Help from another person toileting, which includes using toliet, bedpan, or urinal?: A Little Help from another person bathing (including washing, rinsing, drying)?: A Little Help from another person to put on and taking off regular upper body clothing?: A Little Help from another person to put on and taking off regular lower body clothing?:  A Little 6 Click Score: 20   End of Session    Activity Tolerance: Patient tolerated treatment well Patient left: in chair;with call bell/phone within reach;with chair alarm set;with nursing/sitter in room  OT Visit Diagnosis: History of falling (Z91.81);Muscle weakness (generalized) (M62.81)                Time: 1454-1510 OT Time Calculation (min): 16 min Charges:  OT General Charges $OT Visit: 1 Visit OT Evaluation $OT Eval Low Complexity: 1 Low  Jeni Salles, MPH, MS, OTR/L ascom [phone] 03/30/18, 4:41 PM

## 2018-03-30 NOTE — Progress Notes (Signed)
Patient with 6 beats VT per CCMD, asymptomatic. Dr. Leslye Peer notified. Order for 2g IV Magnesium.

## 2018-03-30 NOTE — Progress Notes (Signed)
Progress Note  Patient Name: Glenn Cress Sr. Date of Encounter: 03/30/2018  Primary Cardiologist: Kathlyn Sacramento, MD  Subjective   Patient without chest pain, dyspnea, or palpitations.  He did have a 7-minute run of SVT this morning which broke spontaneously.  He was not aware that that occurred.  He is hopeful that he will be able to go home.  He has not been out of bed much.  Inpatient Medications    Scheduled Meds: . colchicine  0.6 mg Oral BID  . enoxaparin (LOVENOX) injection  30 mg Subcutaneous Q24H  . furosemide  20 mg Oral Daily  . gabapentin  100 mg Oral TID  . levothyroxine  50 mcg Oral QAC breakfast  . losartan  100 mg Oral Daily  . metoprolol tartrate  12.5 mg Oral Once  . metoprolol tartrate  37.5 mg Oral BID  . pantoprazole  40 mg Oral Daily  . predniSONE  20 mg Oral BID WC  . sodium chloride flush  3 mL Intravenous Q12H  . triamcinolone   Topical TID   Continuous Infusions:  PRN Meds: acetaminophen **OR** acetaminophen, metoprolol tartrate, ondansetron **OR** ondansetron (ZOFRAN) IV, traMADol   Vital Signs    Vitals:   03/29/18 1716 03/29/18 1936 03/30/18 0341 03/30/18 0758  BP: (!) 144/69 140/88 124/90 (!) 141/78  Pulse: 68 75 64 (!) 55  Resp:  17 17 16   Temp: 97.8 F (36.6 C) (!) 97.3 F (36.3 C) 98 F (36.7 C) 98 F (36.7 C)  TempSrc: Oral Oral Oral Oral  SpO2: 98% 100% 100% 100%  Weight:      Height:        Intake/Output Summary (Last 24 hours) at 03/30/2018 0949 Last data filed at 03/30/2018 0500 Gross per 24 hour  Intake 383.33 ml  Output 500 ml  Net -116.67 ml   Filed Weights   03/29/18 1432  Weight: 150 lb (68 kg)    Physical Exam   GEN: Well nourished, well developed, in no acute distress.  HEENT: Grossly normal.  Neck: Supple, no JVD, carotid bruits, or masses. Cardiac: RRR, no murmurs, rubs, or gallops. No clubbing, cyanosis, edema.  Radials/DP/PT 2+ and equal bilaterally.  Respiratory:  Respirations regular and  unlabored, clear to auscultation bilaterally. GI: Soft, nontender, nondistended, BS + x 4. MS: no deformity or atrophy. Skin: warm and dry, no rash. Neuro:  Strength and sensation are intact. Psych: AAOx3.  Normal affect.  Labs    Chemistry Recent Labs  Lab 03/29/18 1016 03/30/18 0424  NA 134* 135  K 3.9 4.7  CL 102 103  CO2 24 24  GLUCOSE 130* 129*  BUN 29* 37*  CREATININE 2.28* 2.29*  CALCIUM 8.4* 8.3*  PROT  --  6.5  ALBUMIN  --  2.5*  AST  --  30  ALT  --  16*  ALKPHOS  --  104  BILITOT  --  0.8  GFRNONAA 24* 24*  GFRAA 28* 27*  ANIONGAP 8 8     Hematology Recent Labs  Lab 03/29/18 1016 03/30/18 0424  WBC 11.7* 10.4  RBC 3.36* 3.61*  HGB 9.6* 10.4*  HCT 29.4* 31.2*  MCV 87.3 86.3  MCH 28.6 28.8  MCHC 32.7 33.4  RDW 16.6* 16.8*  PLT 97* 106*    Cardiac Enzymes Recent Labs  Lab 03/29/18 1215  TROPONINI 0.04*      Radiology    No results found.  Telemetry    Regular sinus rhythm with a run  of SVT lasting about 7 minutes this morning.- Personally Reviewed  Cardiac Studies   2D Echocardiogram 11.27.2018  Study Conclusions   - Left ventricle: The cavity size was normal. Wall thickness was   normal. Systolic function was normal. The estimated ejection   fraction was in the range of 60% to 65%. Wall motion was normal;   there were no regional wall motion abnormalities. Left   ventricular diastolic function parameters were normal.   Impressions:   - Normal study. No cardiac source of emboli was indentified.   Patient Profile     84 y.o. male with a history of stage IV chronic kidney disease, GERD, gout, hyperlipidemia, hypertension, and vertigo, who was admitted with severe left arm and hand pain with swelling in the setting of gout and developed supraventricular tachycardia.  Assessment & Plan    1.  Paroxysmal supraventricular tachycardia: Patient with prolonged runs of SVT during the day yesterday and he was placed on metoprolol 25  mg twice daily last night.  Had a brief, 7-minute run of SVT this morning.  He denies any symptoms.  Heart rates mostly 60s-70s with stable blood pressures.  Will titrate metoprolol to 37.5 mg twice daily.  Ambulate today and consider discharge if stable.  We will arrange outpatient cardiology follow-up in a few weeks.  2.  Essential hypertension: Blood pressure variable.  Titrating beta-blocker.  3.  Acute gout: Steroids per internal medicine.  Colchicine dosing limited by chronic kidney disease.  4.  Stage IV chronic kidney disease: Creatinine stable.  5.  Elevated troponin: Minimal elevation at 0.04.  No follow-ups ordered.  No chest pain or dyspnea.  As previously noted, no further  ischemic evaluation planned.  Signed, Murray Hodgkins, NP  03/30/2018, 9:49 AM    For questions or updates, please contact   Please consult [email or web address] for contact info under Cardiology/STEMI.

## 2018-03-31 ENCOUNTER — Telehealth: Payer: Self-pay

## 2018-03-31 ENCOUNTER — Telehealth: Payer: Self-pay | Admitting: Family Medicine

## 2018-03-31 DIAGNOSIS — K219 Gastro-esophageal reflux disease without esophagitis: Secondary | ICD-10-CM | POA: Diagnosis present

## 2018-03-31 DIAGNOSIS — I471 Supraventricular tachycardia: Secondary | ICD-10-CM | POA: Diagnosis present

## 2018-03-31 DIAGNOSIS — R410 Disorientation, unspecified: Secondary | ICD-10-CM | POA: Diagnosis not present

## 2018-03-31 DIAGNOSIS — Z79890 Hormone replacement therapy (postmenopausal): Secondary | ICD-10-CM | POA: Diagnosis not present

## 2018-03-31 DIAGNOSIS — M1990 Unspecified osteoarthritis, unspecified site: Secondary | ICD-10-CM | POA: Diagnosis present

## 2018-03-31 DIAGNOSIS — Z87891 Personal history of nicotine dependence: Secondary | ICD-10-CM | POA: Diagnosis not present

## 2018-03-31 DIAGNOSIS — M109 Gout, unspecified: Secondary | ICD-10-CM | POA: Diagnosis present

## 2018-03-31 DIAGNOSIS — R531 Weakness: Secondary | ICD-10-CM | POA: Diagnosis present

## 2018-03-31 DIAGNOSIS — M1000 Idiopathic gout, unspecified site: Secondary | ICD-10-CM | POA: Diagnosis present

## 2018-03-31 DIAGNOSIS — I129 Hypertensive chronic kidney disease with stage 1 through stage 4 chronic kidney disease, or unspecified chronic kidney disease: Secondary | ICD-10-CM | POA: Diagnosis present

## 2018-03-31 DIAGNOSIS — E785 Hyperlipidemia, unspecified: Secondary | ICD-10-CM | POA: Diagnosis present

## 2018-03-31 DIAGNOSIS — G629 Polyneuropathy, unspecified: Secondary | ICD-10-CM | POA: Diagnosis present

## 2018-03-31 DIAGNOSIS — I248 Other forms of acute ischemic heart disease: Secondary | ICD-10-CM | POA: Diagnosis present

## 2018-03-31 DIAGNOSIS — N179 Acute kidney failure, unspecified: Secondary | ICD-10-CM | POA: Diagnosis present

## 2018-03-31 DIAGNOSIS — E079 Disorder of thyroid, unspecified: Secondary | ICD-10-CM | POA: Diagnosis present

## 2018-03-31 DIAGNOSIS — Z79899 Other long term (current) drug therapy: Secondary | ICD-10-CM | POA: Diagnosis not present

## 2018-03-31 DIAGNOSIS — N184 Chronic kidney disease, stage 4 (severe): Secondary | ICD-10-CM | POA: Diagnosis present

## 2018-03-31 DIAGNOSIS — Z888 Allergy status to other drugs, medicaments and biological substances status: Secondary | ICD-10-CM | POA: Diagnosis not present

## 2018-03-31 DIAGNOSIS — R001 Bradycardia, unspecified: Secondary | ICD-10-CM | POA: Diagnosis not present

## 2018-03-31 DIAGNOSIS — R42 Dizziness and giddiness: Secondary | ICD-10-CM | POA: Diagnosis present

## 2018-03-31 DIAGNOSIS — E039 Hypothyroidism, unspecified: Secondary | ICD-10-CM | POA: Diagnosis present

## 2018-03-31 DIAGNOSIS — Z885 Allergy status to narcotic agent status: Secondary | ICD-10-CM | POA: Diagnosis not present

## 2018-03-31 MED ORDER — TRAZODONE HCL 50 MG PO TABS
50.0000 mg | ORAL_TABLET | Freq: Every day | ORAL | Status: DC
  Administered 2018-04-01: 50 mg via ORAL
  Filled 2018-03-31: qty 1

## 2018-03-31 MED ORDER — PREDNISONE 10 MG PO TABS
5.0000 mg | ORAL_TABLET | Freq: Every day | ORAL | Status: DC
  Administered 2018-04-01 – 2018-04-02 (×2): 5 mg via ORAL
  Filled 2018-03-31 (×2): qty 1

## 2018-03-31 MED ORDER — AMIODARONE HCL 200 MG PO TABS
200.0000 mg | ORAL_TABLET | Freq: Two times a day (BID) | ORAL | Status: DC
  Administered 2018-03-31 – 2018-04-01 (×2): 200 mg via ORAL
  Filled 2018-03-31 (×2): qty 1

## 2018-03-31 MED ORDER — METOPROLOL TARTRATE 50 MG PO TABS
50.0000 mg | ORAL_TABLET | Freq: Two times a day (BID) | ORAL | Status: DC
  Filled 2018-03-31: qty 1

## 2018-03-31 MED ORDER — PREDNISONE 10 MG PO TABS
10.0000 mg | ORAL_TABLET | Freq: Every day | ORAL | Status: DC
  Administered 2018-03-31: 10 mg via ORAL

## 2018-03-31 MED ORDER — HALOPERIDOL LACTATE 5 MG/ML IJ SOLN: 1.0000 mg | Freq: Four times a day (QID) | INTRAMUSCULAR | Status: DC | PRN

## 2018-03-31 MED ORDER — MELATONIN 5 MG PO TABS
5.0000 mg | ORAL_TABLET | Freq: Every day | ORAL | Status: DC
  Administered 2018-04-01: 5 mg via ORAL
  Filled 2018-03-31 (×3): qty 1

## 2018-03-31 MED ORDER — METOPROLOL TARTRATE 25 MG PO TABS
25.0000 mg | ORAL_TABLET | Freq: Three times a day (TID) | ORAL | Status: DC
  Administered 2018-03-31: 25 mg via ORAL
  Filled 2018-03-31 (×2): qty 1

## 2018-03-31 NOTE — Progress Notes (Signed)
Responding to bed alarm, pt. Found ambulating in room, no sitter present at this time, morning sitter left at for lunch and no relief sitter placed. This RN was not notified that patient had no sitter, and patient room was not close to nurse station, this patient has now been moved to room in front of nurses station, floor mats in place, low bed ordered. sitter now at bedside as patient is confused at times and impulsive as well as unsteady.

## 2018-03-31 NOTE — Progress Notes (Signed)
Pt acting confused and impulsive, did not sleep over night, pt is convinced that what we are giving him in the hospital is making him "confused' but also claims that he is not "confused" Pt put on his clothes and wanted to leave hospital, I called her daughter [name]) and she states that she has noticed that her father is being "mentally declining" lately, daughter also states that she cannot take care of her father at this time, she stated that they are "looking for a home for my father, I cannot take care of him at this time.Marland KitchenMarland KitchenI have an autistic child.Marland KitchenMarland KitchenI cannot take care of him" daughter is requesting for help, because she cannot do anything at this moment. I will put in a case management/SW consult. Will continue to monitor, order in place for 1:1 sitter

## 2018-03-31 NOTE — Telephone Encounter (Signed)
Please review. Thanks!  

## 2018-03-31 NOTE — Progress Notes (Signed)
Physical Therapy Treatment Patient Details Name: Glenn Hoback Sr. MRN: [id number] DOB: [birth year] Today's Date: 03/31/2018    History of Present Illness Pt is an 84 y.o. male presenting to hospital 03/29/18 with increased weakness and pain in L hand/shoulder/leg x3 days; pt also noted to be tachy with HR 144 bpm).  Pt admitted with gout and likely paroxysmal SVT.  PMH includes gout, htn, vertigo, back surgery, CKD.    PT Comments    Pt appearing confused today--nursing and MD aware (no confusion was noted yesterday during session although pt was [facility]) requiring SBA to CGA for safety with functional mobility.  Pt appearing with minimal L wrist and L ankle/heel pain today (pt using L UE for transfers but resting L hand on walker during ambulation/using L UE on RW for more of balance; very minimal L LE decreased stance time noted during ambulation).  HR 72 bpm prior to ambulation but end of ambulation up to 154 bpm (nursing notified and nurse was present end of session to give pt his HR medication); HR gradually improved back down to 70's bpm.    Follow Up Recommendations  Home health PT;Supervision - Intermittent     Equipment Recommendations  Rolling walker with 5" wheels    Recommendations for Other Services OT consult     Precautions / Restrictions Precautions Precautions: Fall Required Braces or Orthoses: Other Brace/Splint Other Brace/Splint: L wrist splint Restrictions Weight Bearing Restrictions: No    Mobility  Bed Mobility Overal bed mobility: Needs Assistance Bed Mobility: Supine to Sit     Supine to sit: Supervision     General bed mobility comments: SBA for safety d/t impulsivity  Transfers Overall transfer level: Needs assistance Equipment used: Rolling walker (2 wheeled) Transfers: Sit to/from Stand Sit to Stand: Min guard         General transfer comment: mild increased effort to stand with mild posterior lean initially but pt able to self correct without  cueing with use of RW  Ambulation/Gait Ambulation/Gait assistance: Min guard Ambulation Distance (Feet): 240 Feet Assistive device: Rolling walker (2 wheeled)   Gait velocity: mildly decreased   General Gait Details: very mild decreased stance time L LE; no significant WB'ing noted through L UE on walker (pt more resting L hand on walker and using walker for balance)   Stairs            Wheelchair Mobility    Modified Rankin (Stroke Patients Only)       Balance Overall balance assessment: Needs assistance;History of Falls Sitting-balance support: No upper extremity supported;Feet supported Sitting balance-Leahy Scale: Normal Sitting balance - Comments: steady sitting reaching outside BOS   Standing balance support: Bilateral upper extremity supported Standing balance-Leahy Scale: Poor Standing balance comment: requires B UE support on RW for steady ambulation                            Cognition Arousal/Alertness: Awake/alert Behavior During Therapy: Impulsive Overall Cognitive Status: Impaired/Different from baseline Area of Impairment: Orientation(Pt oriented to person but not place, time, situation)                                      Exercises      General Comments General comments (skin integrity, edema, etc.): Sitter present throughout session.  Nursing cleared pt for participation in physical therapy.  Pt agreeable  to PT session.      Pertinent Vitals/Pain Pain Assessment: Faces Faces Pain Scale: Hurts a little bit Pain Location: L wrist and L ankle/heel Pain Descriptors / Indicators: (occasional mild grimacing) Pain Intervention(s): Limited activity within patient's tolerance;Monitored during session;Premedicated before session;Repositioned  O2 sats WFL on room air.    Home Living                      Prior Function            PT Goals (current goals can now be found in the care plan section) Acute Rehab PT  Goals Patient Stated Goal: to improve strength PT Goal Formulation: With patient Time For Goal Achievement: 04/13/18 Potential to Achieve Goals: Good Progress towards PT goals: Progressing toward goals    Frequency    Min 2X/week      PT Plan Current plan remains appropriate    Co-evaluation              AM-PAC PT "6 Clicks" Daily Activity  Outcome Measure  Difficulty turning over in bed (including adjusting bedclothes, sheets and blankets)?: None Difficulty moving from lying on back to sitting on the side of the bed? : None Difficulty sitting down on and standing up from a chair with arms (e.g., wheelchair, bedside commode, etc,.)?: Unable Help needed moving to and from a bed to chair (including a wheelchair)?: A Little Help needed walking in hospital room?: A Little Help needed climbing 3-5 steps with a railing? : A Little 6 Click Score: 18    End of Session Equipment Utilized During Treatment: Gait belt;Other (comment)(L wrist splint) Activity Tolerance: Treatment limited secondary to medical complications (Comment)(Increased HR with activity) Patient left: in chair;with call bell/phone within reach;with chair alarm set;with nursing/sitter in room Nurse Communication: Mobility status;Precautions;Other (comment)(Pt's pain status and elevated HR) PT Visit Diagnosis: Unsteadiness on feet (R26.81);Other abnormalities of gait and mobility (R26.89);Muscle weakness (generalized) (M62.81);History of falling (Z91.81);Pain Pain - Right/Left: Left Pain - part of body: Ankle and joints of foot     Time: 3887-1959 PT Time Calculation (min) (ACUTE ONLY): 23 min  Charges:  $Gait Training: 8-22 mins $Therapeutic Activity: 8-22 mins                    G CodesLeitha Bleak, PT 03/31/18, 11:23 AM [phone]

## 2018-03-31 NOTE — Progress Notes (Signed)
Patient ID: Glenn Jarvis., male   DOB: 1927/08/23, 84 y.o.   MRN: [id number]  [facility] PROGRESS NOTE  Glenn Kracht Sr. ALP:[id number] DOB: December 20, 1927 DOA: 03/29/2018 PCP: Margo Common, PA  HPI/Subjective: Patient seen this morning.  He was dressed and ready to go.  States that his ankle and his wrists are much better.  He is able to move them better.  He did not sleep last night.  It took a while to give the patient his medications this morning.  Patient was able to be refocused and then answer questions.  Objective: Vitals:   03/31/18 0955 03/31/18 0957  BP:  138/73  Pulse: 62 (!) 59  Resp:    Temp:    SpO2:  98%    Filed Weights   03/29/18 1432  Weight: 68 kg (150 lb)    ROS: Review of Systems  Constitutional: Negative for chills and fever.  Eyes: Negative for blurred vision.  Respiratory: Negative for cough and shortness of breath.   Cardiovascular: Negative for chest pain.  Gastrointestinal: Negative for abdominal pain, constipation, diarrhea, nausea and vomiting.  Genitourinary: Negative for dysuria.  Musculoskeletal: Positive for joint pain.  Neurological: Negative for dizziness and headaches.   Exam: Physical Exam  HENT:  Nose: No mucosal edema.  Mouth/Throat: No oropharyngeal exudate or posterior oropharyngeal edema.  Eyes: Pupils are equal, round, and reactive to light. Conjunctivae, EOM and lids are normal.  Neck: No JVD present. Carotid bruit is not present. No edema present. No thyroid mass and no thyromegaly present.  Cardiovascular: S1 normal and S2 normal. Exam reveals no gallop.  No murmur heard. Pulses:      Dorsalis pedis pulses are 2+ on the right side, and 2+ on the left side.  Respiratory: No respiratory distress. He has no wheezes. He has no rhonchi. He has no rales.  GI: Soft. Bowel sounds are normal. There is no tenderness.  Musculoskeletal:       Left wrist: He exhibits decreased range of motion, tenderness and swelling.    Right ankle: He exhibits swelling.       Left ankle: He exhibits decreased range of motion and swelling. Tenderness.  Swelling a little less on the left ankle and heel and left wrist.  Lymphadenopathy:    He has no cervical adenopathy.  Neurological: He is alert.  Patient moves all extremities to command.  Skin: Skin is warm. No rash noted. Nails show no clubbing.  Psychiatric: He is agitated.      Data Reviewed: Basic Metabolic Panel: Recent Labs  Lab 03/29/18 1016 03/30/18 0424  NA 134* 135  K 3.9 4.7  CL 102 103  CO2 24 24  GLUCOSE 130* 129*  BUN 29* 37*  CREATININE 2.28* 2.29*  CALCIUM 8.4* 8.3*   Liver Function Tests: Recent Labs  Lab 03/30/18 0424  AST 30  ALT 16*  ALKPHOS 104  BILITOT 0.8  PROT 6.5  ALBUMIN 2.5*   CBC: Recent Labs  Lab 03/29/18 1016 03/30/18 0424  WBC 11.7* 10.4  HGB 9.6* 10.4*  HCT 29.4* 31.2*  MCV 87.3 86.3  PLT 97* 106*   Cardiac Enzymes: Recent Labs  Lab 03/29/18 1215  TROPONINI 0.04*    Scheduled Meds: . allopurinol  50 mg Oral Daily  . amiodarone  200 mg Oral BID  . colchicine  0.6 mg Oral BID  . enoxaparin (LOVENOX) injection  30 mg Subcutaneous Q24H  . gabapentin  100 mg Oral TID  .  levothyroxine  50 mcg Oral QAC breakfast  . losartan  100 mg Oral Daily  . Melatonin  5 mg Oral QHS  . metoprolol tartrate  25 mg Oral TID  . pantoprazole  40 mg Oral Daily  . [START ON 04/01/2018] predniSONE  5 mg Oral Q breakfast  . sodium chloride flush  3 mL Intravenous Q12H  . traZODone  50 mg Oral QHS  . triamcinolone   Topical TID   Continuous Infusions:  Assessment/Plan:  1. Persistent SVT.  I told the nurse that we need the metoprolol 25 3 times daily.  Cardiology added amiodarone.  We will need to watch in the hospital at least 1 more day for heart rate.   2. Acute delirium.  Worsened from not sleeping last night.  Start melatonin at night and trazodone.  PRN Haldol.  Can use Seroquel at night if does not sleep tonight.    3. Polyarticular gout.  Taper prednisone quickly to 5 mg tomorrow.  Add allopurinol.  Continue colchicine.  Will need renally dosed colchicine upon discharge 4. Hypothyroidism unspecified on levothyroxine 5. Essential hypertension on losartan and metoprolol.   6. Neuropathy on gabapentin. 7. Case discussed with daughter on the phone.  I am concerned about him living alone.  Code Status:     Code Status Orders  (From admission, onward)        Start     Ordered   03/29/18 1534  Full code  Continuous     03/29/18 1534    Code Status History    Date Active Date Inactive Code Status Order ID Comments User Context   11/24/2017 0139 11/27/2017 1950 Full Code [id number]  Gorden Harms, MD Inpatient   03/17/2016 0233 03/18/2016 2027 Full Code [id number]  Hubbard Robinson, MD ED    Advance Directive Documentation     Most Recent Value  Type of Advance Directive  Healthcare Power of Attorney  Pre-existing out of facility DNR order (yellow form or pink MOST form)  -  "MOST" Form in Place?  -                                            Disposition Plan: [place] will need to clear.  Case discussed with both social work team and Transport planner.  Daughter trying to work on a family member to come in from out of town.  Time spent: 28 minutes  [place]

## 2018-03-31 NOTE — Telephone Encounter (Signed)
-----   Message from Theora Gianotti, NP sent at 03/30/2018  9:56 AM EDT ----- S,  Couple week f/u with me, ryan, or arida pls.  T,  C

## 2018-03-31 NOTE — Telephone Encounter (Signed)
[name] with Glenn Jarvis is calling to let you know that patient missed a appointment for PT due to patient being in the hospital for observation for uncontrollable pain and she will follow up with him tomorrow if he is home.

## 2018-03-31 NOTE — Telephone Encounter (Signed)
L mom for pt to schedule 2 wk f/u (TOC) with christopher berge, np, ryan dunn, pa, or Dr. Fletcher Anon

## 2018-03-31 NOTE — Progress Notes (Signed)
Phoned daughter to inform her of pt. New room location. Cell phone voice mail full can not leave a message, tried calling her work, no answer.

## 2018-03-31 NOTE — Clinical Social Work Note (Signed)
CSW received consult that patient may need SNF placement, however, PT is recommending home health.  CSW to continue to follow in case recommendations changes.  Jones Broom. Straughn, MSW, [place]  03/31/2018 5:57 PM

## 2018-03-31 NOTE — Progress Notes (Signed)
Daughter [name] contacted and updated on patient status and new location in room 251.

## 2018-03-31 NOTE — Progress Notes (Signed)
Progress Note  Patient Name: Glenn Galvan Sr. Date of Encounter: 03/31/2018  Primary Cardiologist: Kathlyn Sacramento, MD  Subjective   Denies chest pain, dyspnea, or palpitations.  He has had multiple prolonged runs of SVT over the past 12-16 hours.  He says he was not aware of this.  Inpatient Medications    Scheduled Meds: . allopurinol  50 mg Oral Daily  . colchicine  0.6 mg Oral BID  . enoxaparin (LOVENOX) injection  30 mg Subcutaneous Q24H  . gabapentin  100 mg Oral TID  . levothyroxine  50 mcg Oral QAC breakfast  . losartan  100 mg Oral Daily  . metoprolol tartrate  25 mg Oral TID  . pantoprazole  40 mg Oral Daily  . [START ON 04/01/2018] predniSONE  5 mg Oral Q breakfast  . sodium chloride flush  3 mL Intravenous Q12H  . traZODone  50 mg Oral QHS  . triamcinolone   Topical TID   Continuous Infusions:  PRN Meds: acetaminophen **OR** acetaminophen, metoprolol tartrate, ondansetron **OR** ondansetron (ZOFRAN) IV   Vital Signs    Vitals:   03/31/18 0358 03/31/18 0730 03/31/18 0955 03/31/18 0957  BP:  (!) 122/91  138/73  Pulse: 65 78 62 (!) 59  Resp:  17    Temp:  98.7 F (37.1 C)    TempSrc:      SpO2:  (!) 84%  98%  Weight:      Height:        Intake/Output Summary (Last 24 hours) at 03/31/2018 1236 Last data filed at 03/31/2018 1013 Gross per 24 hour  Intake 530 ml  Output 0 ml  Net 530 ml   Filed Weights   03/29/18 1432  Weight: 150 lb (68 kg)    Physical Exam   GEN: Well nourished, well developed, in no acute distress.  HEENT: Grossly normal.  Neck: Supple, no JVD, carotid bruits, or masses. Cardiac: RRR, no murmurs, rubs, or gallops. No clubbing, cyanosis, edema.  Radials/DP/PT 2+ and equal bilaterally.  Respiratory:  Respirations regular and unlabored, clear to auscultation bilaterally. GI: Soft, nontender, nondistended, BS + x 4. MS: no deformity or atrophy. Skin: warm and dry, no rash. Neuro:  Strength and sensation are intact. Psych:  AAOx3.  Normal affect.  Labs    Chemistry Recent Labs  Lab 03/29/18 1016 03/30/18 0424  NA 134* 135  K 3.9 4.7  CL 102 103  CO2 24 24  GLUCOSE 130* 129*  BUN 29* 37*  CREATININE 2.28* 2.29*  CALCIUM 8.4* 8.3*  PROT  --  6.5  ALBUMIN  --  2.5*  AST  --  30  ALT  --  16*  ALKPHOS  --  104  BILITOT  --  0.8  GFRNONAA 24* 24*  GFRAA 28* 27*  ANIONGAP 8 8     Hematology Recent Labs  Lab 03/29/18 1016 03/30/18 0424  WBC 11.7* 10.4  RBC 3.36* 3.61*  HGB 9.6* 10.4*  HCT 29.4* 31.2*  MCV 87.3 86.3  MCH 28.6 28.8  MCHC 32.7 33.4  RDW 16.6* 16.8*  PLT 97* 106*    Cardiac Enzymes Recent Labs  Lab 03/29/18 1215  TROPONINI 0.04*     Radiology    No results found.  Telemetry    Sinus rhythm with prolonged runs of SVT with rates into the 140s.- Personally Reviewed  Cardiac Studies   2D Echocardiogram 11.27.2018  Study Conclusions  - Left ventricle: The cavity size was normal. Wall thickness was  normal. Systolic function was normal. The estimated ejection fraction was in the range of 60% to 65%. Wall motion was normal; there were no regional wall motion abnormalities. Left ventricular diastolic function parameters were normal.  Impressions:  - Normal study. No cardiac source of emboli was indentified.   Patient Profile     84 y.o. male with a history of stage IV chronic kidney disease, GERD, gout, hyperlipidemia, hypertension, and vertigo, who was admitted with severe left arm and hand pain with swelling in the setting of gout and developed supraventricular tachycardia.  Assessment & Plan    1.  Paroxysmal supraventricular tachycardia: Patient with multiple prolonged episodes of SVT yesterday evening and overnight.  He denies any symptoms.  Resting heart rates remained in the 60-70s but when in SVT, he is frequently in the 140s.  Considering frequent recurrence despite titration of beta-blocker, we will add amiodarone 200 mg twice daily  with a plan to drop this to 200 mg daily in about a week.  With his advanced age and dementia, it is not clear that he would be a good candidate for catheter ablation.  2.  Essential hypertension: Blood pressure stable.  Titrating beta-blocker some this morning.  3.  Acute gout: Steroids per internal medicine.  He has received multiple doses of colchicine.  Will need to be careful with dosing in the setting of stage IV chronic kidney disease.  4.  Stage IV chronic kidney disease: Creatinine stable yesterday.  5.  Elevated troponin: Minimal elevation at 0.04.  No chest pain or dyspnea.  As previously noted, no further ischemic evaluation planned.  Signed, Murray Hodgkins, NP  03/31/2018, 12:36 PM    For questions or updates, please contact   Please consult [email or web address] for contact info under Cardiology/STEMI.

## 2018-03-31 NOTE — Plan of Care (Signed)
  Problem: Education: Goal: Knowledge of General Education information will improve Outcome: Progressing   Problem: Clinical Measurements: Goal: Ability to maintain clinical measurements within normal limits will improve Outcome: Progressing Goal: Will remain free from infection Outcome: Progressing Goal: Diagnostic test results will improve Outcome: Progressing Goal: Respiratory complications will improve Outcome: Progressing Goal: Cardiovascular complication will be avoided Outcome: Progressing   Problem: Activity: Goal: Risk for activity intolerance will decrease Outcome: Progressing   Problem: Pain Managment: Goal: General experience of comfort will improve Outcome: Progressing

## 2018-03-31 NOTE — Care Management (Addendum)
Patient very agitated  all night and did not sleep.  Requiring tele sitter and required to be moved closer to the nursing station. Spoke with daughter [name] about continued recommendation  that patient's functional needs can be met with home health. Discussed the need that patient have close supervision for a while and [name] says there are family members that can  assist.  Continue to anticipate resumption of [facility] and add RN

## 2018-04-01 LAB — BASIC METABOLIC PANEL
Anion gap: 10 (ref 5–15)
BUN: 68 mg/dL — ABNORMAL HIGH (ref 6–20)
CALCIUM: 8 mg/dL — AB (ref 8.9–10.3)
CO2: 22 mmol/L (ref 22–32)
Chloride: 105 mmol/L (ref 101–111)
Creatinine, Ser: 2.61 mg/dL — ABNORMAL HIGH (ref 0.61–1.24)
GFR, EST AFRICAN AMERICAN: 23 mL/min — AB (ref >60)
GFR, EST NON AFRICAN AMERICAN: 20 mL/min — AB (ref >60)
GLUCOSE: 88 mg/dL (ref 65–99)
POTASSIUM: 4.1 mmol/L (ref 3.5–5.1)
SODIUM: 137 mmol/L (ref 135–145)

## 2018-04-01 LAB — TSH: TSH: 2.13 u[IU]/mL (ref 0.350–4.500)

## 2018-04-01 MED ORDER — AMLODIPINE BESYLATE 5 MG PO TABS: 5.0000 mg | ORAL_TABLET | Freq: Every day | ORAL | Status: DC

## 2018-04-01 MED ORDER — COLCHICINE 0.6 MG PO TABS: 0.6000 mg | ORAL_TABLET | Freq: Every day | ORAL | Status: DC

## 2018-04-01 MED ORDER — AMIODARONE HCL 200 MG PO TABS
200.0000 mg | ORAL_TABLET | Freq: Every day | ORAL | Status: DC
  Administered 2018-04-02: 200 mg via ORAL
  Filled 2018-04-01: qty 1

## 2018-04-01 MED ORDER — AMLODIPINE BESYLATE 5 MG PO TABS
2.5000 mg | ORAL_TABLET | Freq: Every day | ORAL | Status: DC
  Administered 2018-04-01: 2.5 mg via ORAL
  Filled 2018-04-01: qty 1

## 2018-04-01 MED ORDER — SODIUM CHLORIDE 0.9 % IV BOLUS
500.0000 mL | Freq: Once | INTRAVENOUS | Status: AC
  Administered 2018-04-01: 500 mL via INTRAVENOUS

## 2018-04-01 MED ORDER — SODIUM CHLORIDE 0.9 % IV BOLUS
250.0000 mL | Freq: Once | INTRAVENOUS | Status: AC
  Administered 2018-04-01: 250 mL via INTRAVENOUS

## 2018-04-01 MED ORDER — HYDRALAZINE HCL 20 MG/ML IJ SOLN
10.0000 mg | INTRAMUSCULAR | Status: DC | PRN
  Administered 2018-04-01: 10 mg via INTRAVENOUS
  Filled 2018-04-01: qty 1

## 2018-04-01 NOTE — Clinical Social Work Note (Signed)
Clinical Social Work Assessment  Patient Details  Name: Glenn Faulkner Sr. MRN: [id number] Date of Birth: 09-30-1927  Date of referral:  04/01/18               Reason for consult:  Facility Placement                Permission sought to share information with:  Family Supports, Customer service manager Permission granted to share information::  Yes, Verbal Permission Granted  Name::     Alan Mulder Daughter  [phone]   Agency::  SNF admissions  Relationship::     Contact Information:     Housing/Transportation Living arrangements for the past 2 months:  Single Family Home Source of Information:  Adult Children Patient Interpreter Needed:  None Criminal Activity/Legal Involvement Pertinent to Current Situation/Hospitalization:  No - Comment as needed Significant Relationships:  Adult Children Lives with:  Adult Children Do you feel safe going back to the place where you live?  No Need for family participation in patient care:  Yes (Comment)  Care giving concerns:  Patient's daughter feels he needs some rehab before he is able to return back home.   Social Worker assessment / plan:  Patient is an 84 year old male who is alert and oriented x2.  Patient has some confusion assessment completed by speaking with patient's daughter.  Patient has not been to rehab before, CSW explained what the process is for going to a facility.  CSW explained how insurance will pay for stay and what to expect at SNF.  Patient's daughter did not express any other questions.  CSW did tell patient's daughter that insurance will have to approve SNF placement, if they do not, then they will have to pay privately or have home health see patient.  Patient's daughter expressed understanding.  Employment status:  Retired Nurse, adult PT Recommendations:  [place] / Referral to community resources:  [place]  Patient/Family's Response to care: Patient's family is in agreement to going to SNF for short term rehab.  Patient/Family's Understanding of and Emotional Response to Diagnosis, Current Treatment, and Prognosis: Patient and family are aware of current treatment plan and prognosis.  Emotional Assessment Appearance:  Appears stated age Attitude/Demeanor/Rapport:    Affect (typically observed):  Appropriate, Calm, Stable Orientation:  Oriented to Self Alcohol / Substance use:  Not Applicable Psych involvement (Current and /or in the community):  No (Comment)  Discharge Needs  Concerns to be addressed:  Lack of Support Readmission within the last 30 days:  No Current discharge risk:  Cognitively Impaired, Lack of support system Barriers to Discharge:  Ship broker, Continued Medical Work up   Anell Barr 04/01/2018, 6:30 PM

## 2018-04-01 NOTE — Plan of Care (Signed)
  Problem: Clinical Measurements: Goal: Ability to maintain clinical measurements within normal limits will improve Outcome: Progressing Goal: Respiratory complications will improve Outcome: Progressing Goal: Cardiovascular complication will be avoided Outcome: Progressing   Problem: Activity: Goal: Risk for activity intolerance will decrease Outcome: Progressing   

## 2018-04-01 NOTE — Progress Notes (Addendum)
BLood pressure 179/83.  Explained to patient that this is very high and may need to take medication to bring it down. He states that he will not take any medication and he refuses to have blood pressure rechecked. Educated patient on the danger of this. Notified Dr. Jannifer Franklin. WIll continue to monitor.

## 2018-04-01 NOTE — Progress Notes (Signed)
Patient ID: Glenn Naeem., male   DOB: [birth year], 84 y.o.   MRN: [id number]  [facility] PROGRESS NOTE  Glenn Caseres Sr. GYI:[id number] DOB: 16-Dec-1927 DOA: 03/29/2018 PCP: Margo Common, PA  HPI/Subjective: Patient feeling much better.  Slept very well last night.  Heart rate on the lower side and beta-blocker was held.  On amiodarone only.  Objective: Vitals:   04/01/18 0740 04/01/18 1402  BP: (!) 155/71 (!) 166/73  Pulse: (!) 53 (!) 48  Resp: 18   Temp:    SpO2: 100%     Filed Weights   03/29/18 1432  Weight: 68 kg (150 lb)    ROS: Review of Systems  Constitutional: Negative for chills and fever.  Eyes: Negative for blurred vision.  Respiratory: Negative for cough and shortness of breath.   Cardiovascular: Negative for chest pain.  Gastrointestinal: Negative for abdominal pain, constipation, diarrhea, nausea and vomiting.  Genitourinary: Negative for dysuria.  Musculoskeletal: Positive for joint pain.  Neurological: Negative for dizziness and headaches.   Exam: Physical Exam  HENT:  Nose: No mucosal edema.  Mouth/Throat: No oropharyngeal exudate or posterior oropharyngeal edema.  Eyes: Pupils are equal, round, and reactive to light. Conjunctivae, EOM and lids are normal.  Neck: No JVD present. Carotid bruit is not present. No edema present. No thyroid mass and no thyromegaly present.  Cardiovascular: S1 normal and S2 normal. Exam reveals no gallop.  No murmur heard. Pulses:      Dorsalis pedis pulses are 2+ on the right side, and 2+ on the left side.  Respiratory: No respiratory distress. He has no wheezes. He has no rhonchi. He has no rales.  GI: Soft. Bowel sounds are normal. There is no tenderness.  Musculoskeletal:       Left wrist: He exhibits decreased range of motion, tenderness and swelling.       Right ankle: He exhibits swelling.       Left ankle: He exhibits decreased range of motion and swelling. Tenderness.  Swelling a little less on  the left ankle and heel and left wrist.  Lymphadenopathy:    He has no cervical adenopathy.  Neurological: He is alert.  Patient moves all extremities to command.  Skin: Skin is warm. No rash noted. Nails show no clubbing.  Psychiatric: He is agitated.      Data Reviewed: Basic Metabolic Panel: Recent Labs  Lab 03/29/18 1016 03/30/18 0424 04/01/18 0407  NA 134* 135 137  K 3.9 4.7 4.1  CL 102 103 105  CO2 24 24 22   GLUCOSE 130* 129* 88  BUN 29* 37* 68*  CREATININE 2.28* 2.29* 2.61*  CALCIUM 8.4* 8.3* 8.0*   Liver Function Tests: Recent Labs  Lab 03/30/18 0424  AST 30  ALT 16*  ALKPHOS 104  BILITOT 0.8  PROT 6.5  ALBUMIN 2.5*   CBC: Recent Labs  Lab 03/29/18 1016 03/30/18 0424  WBC 11.7* 10.4  HGB 9.6* 10.4*  HCT 29.4* 31.2*  MCV 87.3 86.3  PLT 97* 106*   Cardiac Enzymes: Recent Labs  Lab 03/29/18 1215  TROPONINI 0.04*    Scheduled Meds: . allopurinol  50 mg Oral Daily  . [START ON 04/02/2018] amiodarone  200 mg Oral Daily  . amLODipine  2.5 mg Oral Daily  . [START ON 04/02/2018] colchicine  0.6 mg Oral Daily  . enoxaparin (LOVENOX) injection  30 mg Subcutaneous Q24H  . gabapentin  100 mg Oral TID  . levothyroxine  50 mcg Oral QAC  breakfast  . Melatonin  5 mg Oral QHS  . pantoprazole  40 mg Oral Daily  . predniSONE  5 mg Oral Q breakfast  . sodium chloride flush  3 mL Intravenous Q12H  . traZODone  50 mg Oral QHS  . triamcinolone   Topical TID   Continuous Infusions: . sodium chloride      Assessment/Plan:  1. Acute kidney injury on chronic kidney disease stage III.  I will give a fluid bolus this morning and 1 this afternoon.  Check BMP tomorrow morning.  2. Paroxysmal SVT.  Now patient is bradycardic.  Continue amiodarone low dose.  Stop metoprolol. 3. Acute delirium.  Patient slept well last night with melatonin and trazodone. 4. Polyarticular gout.  Taper prednisone quickly to 5 mg tomorrow.  Add allopurinol.  Continue colchicine.  Will  need renally dosed colchicine upon discharge 5. Hypothyroidism unspecified on levothyroxine 6. Essential hypertension on losartan and metoprolol.   7. Neuropathy on gabapentin. 8. Case discussed with daughter on the phone.  I am concerned about him living alone.  She states that she will have her brother with the patient starting tomorrow.  Code Status:     Code Status Orders  (From admission, onward)        Start     Ordered   03/29/18 1534  Full code  Continuous     03/29/18 1534    Code Status History    Date Active Date Inactive Code Status Order ID Comments User Context   11/24/2017 0139 11/27/2017 1950 Full Code [id number]  Gorden Harms, MD Inpatient   03/17/2016 0233 03/18/2016 2027 Full Code [id number]  Hubbard Robinson, MD ED    Advance Directive Documentation     Most Recent Value  Type of Advance Directive  Healthcare Power of Attorney  Pre-existing out of facility DNR order (yellow form or pink MOST form)  -  "MOST" Form in Place?  -                                            Disposition Plan:  hopefully home with home health with  patient's son  Time spent: 54 minutes  [place]

## 2018-04-01 NOTE — NC FL2 (Signed)
[place] LEVEL OF CARE SCREENING TOOL     IDENTIFICATION  Patient Name: Glenn Bothwell Sr. Birthdate: Jan 29, 1927 Sex: male Admission Date (Current Location): 03/29/2018  [place] and [place] Number:  Engineering geologist and Address:  [facility], [address], [place], [place] [address]      Provider Number: [id number]  Attending Physician Name and Address:  Loletha Grayer, MD  Relative Name and Phone Number:  Alan Mulder Daughter  [phone]     Current Level of Care: Hospital Recommended Level of Care: [place] Prior Approval Number:    Date Approved/Denied:   PASRR Number: [id number]  Discharge Plan: SNF    Current Diagnoses: Patient Active Problem List   Diagnosis Date Noted  . Sustained SVT ([place]) 03/31/2018  . PSVT (paroxysmal supraventricular tachycardia) ([place]) 03/29/2018  . Syncope and collapse 11/24/2017  . Edema 10/05/2015  . Allergic rhinitis 07/05/2015  . Arthritis 07/05/2015  . Benign hypertension 07/05/2015  . Chronic kidney disease (CKD), stage III (moderate) ([place]) 07/05/2015  . Acid reflux 07/05/2015  . Gout 07/05/2015  . HLD (hyperlipidemia) 07/05/2015  . Adult hypothyroidism 07/05/2015  . Cannot sleep 07/05/2015  . Hernia, inguinal 07/05/2015  . LBP (low back pain) 07/05/2015  . Cramp in muscle 07/05/2015  . Leg paresthesia 07/05/2015  . CA of prostate ([place]) 07/05/2015  . Cutaneous eruption 07/05/2015  . Head revolving around 07/05/2015  . Hypothyroidism 07/03/2015  . Right inguinal hernia 05/30/2014    Orientation RESPIRATION BLADDER Height & Weight     Self, Time, Situation, Place  Normal Continent Weight: 150 lb (68 kg) Height:  6\' 1"  (185.4 cm)  BEHAVIORAL SYMPTOMS/MOOD NEUROLOGICAL BOWEL NUTRITION STATUS      Continent Diet  AMBULATORY STATUS COMMUNICATION OF NEEDS Skin   Limited Assist Verbally Normal                       Personal Care  Assistance Level of Assistance  Bathing, Feeding, Dressing Bathing Assistance: Limited assistance Feeding assistance: Independent Dressing Assistance: Limited assistance     Functional Limitations Info  Sight, Hearing, Speech Sight Info: Adequate Hearing Info: Adequate Speech Info: Adequate    SPECIAL CARE FACTORS FREQUENCY  PT (By licensed PT), OT (By licensed OT)     PT Frequency: 5x a week OT Frequency: 5x a week            Contractures Contractures Info: Not present    Additional Factors Info  Code Status, Allergies, Psychotropic Code Status Info: Full code Allergies Info: CODEINE, Cortizone-10 HYDROCORTISONE  Psychotropic Info: traZODone (DESYREL) tablet 50 mg          Current Medications (04/01/2018):  This is the current hospital active medication list Current Facility-Administered Medications  Medication Dose Route Frequency Provider Last Rate Last Dose  . acetaminophen (TYLENOL) tablet 650 mg  650 mg Oral Q6H PRN Gouru, Aruna, MD       Or  . acetaminophen (TYLENOL) suppository 650 mg  650 mg Rectal Q6H PRN Gouru, Aruna, MD      . allopurinol (ZYLOPRIM) tablet 50 mg  50 mg Oral Daily Loletha Grayer, MD   50 mg at 04/01/18 0956  . [START ON 04/02/2018] amiodarone (PACERONE) tablet 200 mg  200 mg Oral Daily Theora Gianotti, NP      . Derrill Memo ON 04/02/2018] colchicine tablet 0.6 mg  0.6 mg Oral Daily Wieting, Richard, MD      . enoxaparin (LOVENOX) injection  30 mg  30 mg Subcutaneous Q24H Gouru, Aruna, MD   30 mg at 03/30/18 2118  . gabapentin (NEURONTIN) capsule 100 mg  100 mg Oral TID Gouru, Aruna, MD   100 mg at 04/01/18 0936  . haloperidol lactate (HALDOL) injection 1 mg  1 mg Intravenous Q6H PRN Wieting, Richard, MD      . levothyroxine (SYNTHROID, LEVOTHROID) tablet 50 mcg  50 mcg Oral QAC breakfast Gouru, Aruna, MD   50 mcg at 04/01/18 0935  . Melatonin TABS 5 mg  5 mg Oral QHS Wieting, Richard, MD      . metoprolol tartrate (LOPRESSOR) injection 5 mg   5 mg Intravenous Q4H PRN Gouru, Aruna, MD      . ondansetron (ZOFRAN) tablet 4 mg  4 mg Oral Q6H PRN Gouru, Aruna, MD       Or  . ondansetron (ZOFRAN) injection 4 mg  4 mg Intravenous Q6H PRN Gouru, Aruna, MD      . pantoprazole (PROTONIX) EC tablet 40 mg  40 mg Oral Daily Gouru, Aruna, MD   40 mg at 04/01/18 0935  . predniSONE (DELTASONE) tablet 5 mg  5 mg Oral Q breakfast Loletha Grayer, MD   5 mg at 04/01/18 0934  . sodium chloride flush (NS) 0.9 % injection 3 mL  3 mL Intravenous Q12H Gouru, Aruna, MD   3 mL at 04/01/18 0957  . traZODone (DESYREL) tablet 50 mg  50 mg Oral QHS Wieting, Richard, MD      . triamcinolone (KENALOG) 0.025 % cream   Topical TID Nicholes Mango, MD   1 application at 82/50/53 0940     Discharge Medications: Please see discharge summary for a list of discharge medications.  Relevant Imaging Results:  Relevant Lab Results:   Additional Information SSN [id number]  Ross Ludwig, [place]

## 2018-04-01 NOTE — Telephone Encounter (Signed)
Agree 

## 2018-04-01 NOTE — Clinical Social Work Note (Signed)
CSW spoke with patient's daughter [name] [phone] and presented bed offers, she would like patient to go to [facility].  CSW contacted [employer] and asked them to start insurance approval, CSW left message awaiting for call back.  Jones Broom. [place], MSW, [place]  04/01/2018 6:26 PM

## 2018-04-01 NOTE — Progress Notes (Signed)
Progress Note  Patient Name: Glenn Jarvis. Date of Encounter: 04/01/2018  Primary Cardiologist: Kathlyn [place], MD  Subjective   Patient placed on amiodarone yesterday.  No recurrence of SVT.  He was bradycardic throughout the day yesterday with rates frequently in the high 40s after receiving both amiodarone and 25 mg of metoprolol.  This resulted in holding of metoprolol throughout the day yesterday and also holding of amiodarone last night.  Patient was asymptomatic.  He continues at the sitter at his bedside and actually had to move his room because of agitation yesterday.  He denies chest pain, palpitations, presyncope, or dyspnea this morning.  Inpatient Medications    Scheduled Meds: . allopurinol  50 mg Oral Daily  . [START ON 04/02/2018] amiodarone  200 mg Oral Daily  . [START ON 04/02/2018] colchicine  0.6 mg Oral Daily  . enoxaparin (LOVENOX) injection  30 mg Subcutaneous Q24H  . gabapentin  100 mg Oral TID  . levothyroxine  50 mcg Oral QAC breakfast  . Melatonin  5 mg Oral QHS  . pantoprazole  40 mg Oral Daily  . predniSONE  5 mg Oral Q breakfast  . sodium chloride flush  3 mL Intravenous Q12H  . traZODone  50 mg Oral QHS  . triamcinolone   Topical TID   Continuous Infusions:  PRN Meds: acetaminophen **OR** acetaminophen, haloperidol lactate, metoprolol tartrate, ondansetron **OR** ondansetron (ZOFRAN) IV   Vital Signs    Vitals:   03/31/18 0957 03/31/18 2026 04/01/18 0535 04/01/18 0740  BP: 138/73 135/68 (!) 169/74 (!) 155/71  Pulse: (!) 59 (!) 55 (!) 47 (!) 53  Resp:  16 16 18   Temp:  (!) 97.5 F (36.4 C) (!) 97.2 F (36.2 C)   TempSrc:  Oral Axillary   SpO2: 98% 100% 100% 100%  Weight:      Height:        Intake/Output Summary (Last 24 hours) at 04/01/2018 1217 Last data filed at 04/01/2018 1008 Gross per 24 hour  Intake 483 ml  Output 200 ml  Net 283 ml   Filed Weights   03/29/18 1432  Weight: 150 lb (68 kg)    Physical Exam   GEN: Well  nourished, well developed, in no acute distress.  HEENT: Grossly normal.  Neck: Supple, no JVD, carotid bruits, or masses. Cardiac: RRR, no murmurs, rubs, or gallops. No clubbing, cyanosis, edema.  Radials/DP/PT 2+ and equal bilaterally.  Respiratory:  Respirations regular and unlabored, clear to auscultation bilaterally. GI: Soft, nontender, nondistended, BS + x 4. MS: no deformity or atrophy. Skin: warm and dry, no rash. Neuro:  Strength and sensation are intact. Psych: Normal affect.  He is disoriented to place.  Labs    Chemistry Recent Labs  Lab 03/29/18 1016 03/30/18 0424 04/01/18 0407  NA 134* 135 137  K 3.9 4.7 4.1  CL 102 103 105  CO2 24 24 22   GLUCOSE 130* 129* 88  BUN 29* 37* 68*  CREATININE 2.28* 2.29* 2.61*  CALCIUM 8.4* 8.3* 8.0*  PROT  --  6.5  --   ALBUMIN  --  2.5*  --   AST  --  30  --   ALT  --  16*  --   ALKPHOS  --  104  --   BILITOT  --  0.8  --   GFRNONAA 24* 24* 20*  GFRAA 28* 27* 23*  ANIONGAP 8 8 10      Hematology Recent Labs  Lab 03/29/18 1016  03/30/18 0424  WBC 11.7* 10.4  RBC 3.36* 3.61*  HGB 9.6* 10.4*  HCT 29.4* 31.2*  MCV 87.3 86.3  MCH 28.6 28.8  MCHC 32.7 33.4  RDW 16.6* 16.8*  PLT 97* 106*    Cardiac Enzymes Recent Labs  Lab 03/29/18 1215  TROPONINI 0.04*      Radiology    No results found.  Telemetry    Sinus bradycardia with rates in the high 40s for much of the day yesterday.  No recurrent SVT.- Personally Reviewed  Cardiac Studies   2D Echocardiogram11.27.2018  Study Conclusions  - Left ventricle: The cavity size was normal. Wall thickness was normal. Systolic function was normal. The estimated ejection fraction was in the range of 60% to 65%. Wall motion was normal; there were no regional wall motion abnormalities. Left ventricular diastolic function parameters were normal.  Impressions:  - Normal study. No cardiac source of emboli was indentified.  Patient Profile     84  y.o.malewith a history of stage IV chronic kidney disease, GERD, gout, hyperlipidemia, hypertension, and vertigo, who was admitted with severe left arm and hand pain with swelling in the setting of gout and developed supraventricular tachycardia.  Assessment & Plan    1. Paroxysmal supraventricular tachycardia: Placed on amiodarone 200 mg twice daily yesterday.  Following his first dose, he was noted to be bradycardic with rates in the 40s.  He was asymptomatic.  He has not had any recurrence of SVT.  I am going to reduce his amiodarone dose to 200 mg daily as that was his effective dose yesterday anyway.  I am also going to discontinue beta-blocker as it ended up being held yesterday secondary to bradycardia.  With advanced age and dementia, he is not likely a good candidate for ablative therapy.  2.  Essential hypertension: Blood pressure elevated in the setting of holding of beta-blocker.  Bradycardia will prevent continuation of beta-blocker.  We will add low-dose amlodipine.  3.  Acute gout: Steroids per internal medicine.  Previously dosed with colchicine.  4.  Stage IV chronic kidney disease: Creatinine up slightly today.  Hydration per internal medicine.  5.  Elevated troponin: Minimal elevation of 0.04.  No chest pain or dyspnea.  Previously normal EF.  Continue conservative therapy.  Signed, Murray Hodgkins, NP  04/01/2018, 12:17 PM    For questions or updates, please contact   Please consult [email or web address] for contact info under Cardiology/STEMI.

## 2018-04-02 DIAGNOSIS — R4182 Altered mental status, unspecified: Secondary | ICD-10-CM | POA: Diagnosis not present

## 2018-04-02 DIAGNOSIS — R410 Disorientation, unspecified: Secondary | ICD-10-CM | POA: Diagnosis not present

## 2018-04-02 DIAGNOSIS — G629 Polyneuropathy, unspecified: Secondary | ICD-10-CM | POA: Diagnosis not present

## 2018-04-02 DIAGNOSIS — I10 Essential (primary) hypertension: Secondary | ICD-10-CM | POA: Diagnosis not present

## 2018-04-02 DIAGNOSIS — K219 Gastro-esophageal reflux disease without esophagitis: Secondary | ICD-10-CM | POA: Diagnosis not present

## 2018-04-02 DIAGNOSIS — I129 Hypertensive chronic kidney disease with stage 1 through stage 4 chronic kidney disease, or unspecified chronic kidney disease: Secondary | ICD-10-CM | POA: Diagnosis not present

## 2018-04-02 DIAGNOSIS — Z7401 Bed confinement status: Secondary | ICD-10-CM | POA: Diagnosis not present

## 2018-04-02 DIAGNOSIS — M1009 Idiopathic gout, multiple sites: Secondary | ICD-10-CM | POA: Diagnosis not present

## 2018-04-02 DIAGNOSIS — E030 Congenital hypothyroidism with diffuse goiter: Secondary | ICD-10-CM | POA: Diagnosis not present

## 2018-04-02 DIAGNOSIS — K659 Peritonitis, unspecified: Secondary | ICD-10-CM | POA: Diagnosis not present

## 2018-04-02 DIAGNOSIS — N183 Chronic kidney disease, stage 3 (moderate): Secondary | ICD-10-CM | POA: Diagnosis not present

## 2018-04-02 DIAGNOSIS — N179 Acute kidney failure, unspecified: Secondary | ICD-10-CM | POA: Diagnosis not present

## 2018-04-02 DIAGNOSIS — I471 Supraventricular tachycardia: Secondary | ICD-10-CM | POA: Diagnosis not present

## 2018-04-02 DIAGNOSIS — Z4689 Encounter for fitting and adjustment of other specified devices: Secondary | ICD-10-CM | POA: Diagnosis not present

## 2018-04-02 DIAGNOSIS — E785 Hyperlipidemia, unspecified: Secondary | ICD-10-CM | POA: Diagnosis not present

## 2018-04-02 DIAGNOSIS — M109 Gout, unspecified: Secondary | ICD-10-CM | POA: Diagnosis not present

## 2018-04-02 LAB — BASIC METABOLIC PANEL
ANION GAP: 7 (ref 5–15)
BUN: 62 mg/dL — ABNORMAL HIGH (ref 6–20)
CALCIUM: 7.9 mg/dL — AB (ref 8.9–10.3)
CO2: 23 mmol/L (ref 22–32)
CREATININE: 2.35 mg/dL — AB (ref 0.61–1.24)
Chloride: 109 mmol/L (ref 101–111)
GFR calc Af Amer: 27 mL/min — ABNORMAL LOW (ref >60)
GFR calc non Af Amer: 23 mL/min — ABNORMAL LOW (ref >60)
GLUCOSE: 96 mg/dL (ref 65–99)
Potassium: 3.7 mmol/L (ref 3.5–5.1)
Sodium: 139 mmol/L (ref 135–145)

## 2018-04-02 LAB — VITAMIN B12: VITAMIN B 12: 801 pg/mL (ref 180–914)

## 2018-04-02 LAB — RPR: RPR Ser Ql: NONREACTIVE

## 2018-04-02 MED ORDER — TRAZODONE HCL 50 MG PO TABS: 50.0000 mg | ORAL_TABLET | Freq: Every day | ORAL | 0 refills | Status: DC

## 2018-04-02 MED ORDER — AMLODIPINE BESYLATE 5 MG PO TABS: 5.0000 mg | ORAL_TABLET | Freq: Every day | ORAL | 0 refills | Status: DC

## 2018-04-02 MED ORDER — AMLODIPINE BESYLATE 5 MG PO TABS
5.0000 mg | ORAL_TABLET | Freq: Every day | ORAL | Status: DC
Start: 2018-04-02 — End: 2018-04-03
  Administered 2018-04-02: 5 mg via ORAL
  Filled 2018-04-02: qty 1

## 2018-04-02 MED ORDER — AMIODARONE HCL 200 MG PO TABS: 200.0000 mg | ORAL_TABLET | Freq: Every day | ORAL | 0 refills | Status: DC

## 2018-04-02 MED ORDER — COLCHICINE 0.6 MG PO TABS: 0.3000 mg | ORAL_TABLET | Freq: Every day | ORAL | 0 refills | Status: DC

## 2018-04-02 MED ORDER — PREDNISONE 5 MG PO TABS: 5.0000 mg | ORAL_TABLET | Freq: Every day | ORAL | 0 refills | Status: AC

## 2018-04-02 MED ORDER — COLCHICINE 0.6 MG PO TABS
0.3000 mg | ORAL_TABLET | Freq: Every day | ORAL | Status: DC
  Administered 2018-04-02: 0.3 mg via ORAL
  Filled 2018-04-02: qty 1
  Filled 2018-04-02: qty 0.5

## 2018-04-02 MED ORDER — MELATONIN 5 MG PO TABS: 5.0000 mg | ORAL_TABLET | Freq: Every day | ORAL | 0 refills | Status: DC

## 2018-04-02 MED ORDER — ALLOPURINOL 100 MG PO TABS: 50.0000 mg | ORAL_TABLET | Freq: Every day | ORAL | 0 refills | Status: DC

## 2018-04-02 MED ORDER — AMLODIPINE BESYLATE 5 MG PO TABS: 2.5000 mg | ORAL_TABLET | Freq: Every day | ORAL | Status: DC

## 2018-04-02 NOTE — Progress Notes (Signed)
Report called to [place] at [facility]. EMS notified of transportation. I will continue to assess.

## 2018-04-02 NOTE — Telephone Encounter (Signed)
Patient currently admitted at this time. 

## 2018-04-02 NOTE — Discharge Summary (Signed)
[facility] NAME: Glenn Jarvis    MR#:  [id number]  DATE OF BIRTH:  [birth year]  DATE OF ADMISSION:  03/29/2018 ADMITTING PHYSICIAN: Nicholes Mango, MD  DATE OF DISCHARGE: 04/02/2018  PRIMARY CARE PHYSICIAN: Chrismon, Vickki Muff, PA    ADMISSION DIAGNOSIS:  Weakness [R53.1] Tachycardia [R00.0] Acute idiopathic gout, unspecified site [M10.00]  DISCHARGE DIAGNOSIS:  Active Problems:   PSVT (paroxysmal supraventricular tachycardia) (HCC)   Sustained SVT ([place])   SECONDARY DIAGNOSIS:   Past Medical History:  Diagnosis Date  . Arthritis   . GERD (gastroesophageal reflux disease)   . Hemorrhoid 1986  . Hyperlipidemia   . Hypertension   . Thyroid disease   . Vertigo     HOSPITAL COURSE:   1.  Polyarticular gout.  Patient was placed on prednisone and was tapered on the prednisone pretty quickly down to 5 mg.  I will keep on 5 mg for 1 week.  We added allopurinol.  Colchicine was given higher dose initially and then have to be cut back to renally dose of colchicine 0.3 mg daily.  Because of his chronic kidney disease we are limited what we can give for gout.  Patient's left ankle is still stiff with movement but no pain with palpation or movement.  Left wrist was swollen when he came in and less swollen now unable to move a little bit better.  Patient has a left wrist brace. 2.  Paroxysmal SVT.  The patient did have some persistent SVT while here.  The patient was placed on amiodarone.  Now in sinus bradycardia.  Metoprolol needed to be stopped. 3.  Acute delirium from not sleeping.  I started melatonin and trazodone and this has helped.  Patient is sleeping.  Would have primary care physician screen for underlying dementia as outpatient.  Concerned about the patient living alone.  Social work looking into rehab versus home with home health and family watching. 4.  Acute kidney injury on chronic kidney disease stage III.  I did give the  patient a couple fluid boluses creatinine back down to close to baseline. 5.  Hypothyroidism unspecified on levothyroxine 6.  Essential hypertension.  Switched over to Norvasc.  Can consider hydralazine if blood pressure continues to rise. 7.  We will speak with social work today about rehab versus home with home health.  Daughter concerned about him living alone also.  DISCHARGE CONDITIONS:   Satisfactory  CONSULTS OBTAINED:  Treatment Team:  Wellington Hampshire, MD  DRUG ALLERGIES:   Allergies  Allergen Reactions  . Codeine   . Cortizone-10  [Hydrocortisone]     DISCHARGE MEDICATIONS:   Allergies as of 04/02/2018      Reactions   Codeine    Cortizone-10  [hydrocortisone]       Medication List    STOP taking these medications   dexlansoprazole 60 MG capsule Commonly known as:  DEXILANT   furosemide 20 MG tablet Commonly known as:  LASIX   gabapentin 100 MG capsule Commonly known as:  NEURONTIN   hydrALAZINE 25 MG tablet Commonly known as:  APRESOLINE   losartan 100 MG tablet Commonly known as:  COZAAR   metoprolol tartrate 25 MG tablet Commonly known as:  LOPRESSOR   traMADol 50 MG tablet Commonly known as:  ULTRAM     TAKE these medications   allopurinol 100 MG tablet Commonly known as:  ZYLOPRIM Take 0.5 tablets (50 mg total) by mouth daily.  amiodarone 200 MG tablet Commonly known as:  PACERONE Take 1 tablet (200 mg total) by mouth daily.   amLODipine 5 MG tablet Commonly known as:  NORVASC Take 1 tablet (5 mg total) by mouth daily.   colchicine 0.6 MG tablet Take 0.5 tablets (0.3 mg total) by mouth daily. What changed:    how much to take  when to take this   levothyroxine 50 MCG tablet Commonly known as:  SYNTHROID, LEVOTHROID TAKE 1 TABLET BY MOUTH ONCE DAILY FOR THYROID, ON AN EMPTY STOMACH. WAIT 30 MINUTES BEFORE TAKING OTHER MEDS.   Melatonin 5 MG Tabs Take 1 tablet (5 mg total) by mouth at bedtime.   pantoprazole 40 MG  tablet Commonly known as:  PROTONIX Take 1 tablet (40 mg total) by mouth daily.   predniSONE 5 MG tablet Commonly known as:  DELTASONE Take 1 tablet (5 mg total) by mouth daily with breakfast for 7 doses. What changed:    medication strength  how much to take  additional instructions   traZODone 50 MG tablet Commonly known as:  DESYREL Take 1 tablet (50 mg total) by mouth at bedtime.   triamcinolone 0.025 % cream Commonly known as:  KENALOG TRIAMCINOLONE ACETONIDE, 0.025% (External Cream)  1 (one) Cream Cream apply daily as needed for 0 days  Quantity: 60;  Refills: 0   Ordered :13-Apr-2014  [name] ;  Started 13-Apr-2014 Active Comments: Medication taken as needed.        DISCHARGE INSTRUCTIONS:   Follow-up with Dr. at rehab in 1 day versus PMD in 6 days. Follow-up with cardiology 2 weeks  If you experience worsening of your admission symptoms, develop shortness of breath, life threatening emergency, suicidal or homicidal thoughts you must seek medical attention immediately by calling 911 or calling your MD immediately  if symptoms less severe.  You Must read complete instructions/literature along with all the possible adverse reactions/side effects for all the Medicines you take and that have been prescribed to you. Take any new Medicines after you have completely understood and accept all the possible adverse reactions/side effects.   Please note  You were cared for by a hospitalist during your hospital stay. If you have any questions about your discharge medications or the care you received while you were in the hospital after you are discharged, you can call the unit and asked to speak with the hospitalist on call if the hospitalist that took care of you is not available. Once you are discharged, your primary care physician will handle any further medical issues. Please note that NO REFILLS for any discharge medications will be authorized once you are  discharged, as it is imperative that you return to your primary care physician (or establish a relationship with a primary care physician if you do not have one) for your aftercare needs so that they can reassess your need for medications and monitor your lab values.    Today   CHIEF COMPLAINT:   Chief Complaint  Patient presents with  . Weakness    HISTORY OF PRESENT ILLNESS:  Glenn Jarvis  is a 84 y.o. male came in with weakness and left ankle pain and left wrist pain   VITAL SIGNS:  Blood pressure (!) 125/55, pulse 60, temperature (!) 97.3 F (36.3 C), temperature source Oral, resp. rate 18, height 6\' 1"  (1.854 m), weight 68 kg (150 lb), SpO2 100 %.    PHYSICAL EXAMINATION:  GENERAL:  84 y.o.-year-old patient lying in the bed  with no acute distress.  EYES: Pupils equal, round, reactive to light and accommodation. No scleral icterus. Extraocular muscles intact.  HEENT: Head atraumatic, normocephalic. Oropharynx and nasopharynx clear.  NECK:  Supple, no jugular venous distention. No thyroid enlargement, no tenderness.  LUNGS: Normal breath sounds bilaterally, no wheezing, rales,rhonchi or crepitation. No use of accessory muscles of respiration.  CARDIOVASCULAR: S1, S2 normal. No murmurs, rubs, or gallops.  ABDOMEN: Soft, non-tender, non-distended. Bowel sounds present. No organomegaly or mass.  EXTREMITIES: No pedal edema, cyanosis, or clubbing.  NEUROLOGIC: Cranial nerves II through XII are intact. Muscle strength 5/5 in all extremities. Sensation intact. Gait not checked.  PSYCHIATRIC: The patient is alert and oriented x 3.  SKIN: No obvious rash, lesion, or ulcer.   DATA REVIEW:   CBC Recent Labs  Lab 03/30/18 0424  WBC 10.4  HGB 10.4*  HCT 31.2*  PLT 106*    Chemistries  Recent Labs  Lab 03/30/18 0424  04/02/18 0341  NA 135   < > 139  K 4.7   < > 3.7  CL 103   < > 109  CO2 24   < > 23  GLUCOSE 129*   < > 96  BUN 37*   < > 62*  CREATININE 2.29*   < >  2.35*  CALCIUM 8.3*   < > 7.9*  AST 30  --   --   ALT 16*  --   --   ALKPHOS 104  --   --   BILITOT 0.8  --   --    < > = values in this interval not displayed.    Cardiac Enzymes Recent Labs  Lab 03/29/18 1215  TROPONINI 0.04*    Management plans discussed with the patient, family and they are in agreement.  CODE STATUS:     Code Status Orders  (From admission, onward)        Start     Ordered   03/29/18 1534  Full code  Continuous     03/29/18 1534    Code Status History    Date Active Date Inactive Code Status Order ID Comments User Context   11/24/2017 0139 11/27/2017 1950 Full Code [id number]  Gorden Harms, MD Inpatient   03/17/2016 0233 03/18/2016 2027 Full Code [id number]  Hubbard Robinson, MD ED    Advance Directive Documentation     Most Recent Value  Type of Advance Directive  Healthcare Power of Attorney  Pre-existing out of facility DNR order (yellow form or pink MOST form)  -  "MOST" Form in Place?  -      TOTAL TIME TAKING CARE OF THIS PATIENT: 35 minutes.    Loletha Grayer M.D on 04/02/2018 at 8:16 AM  Between 7am to 6pm - Pager - [phone]  After 6pm go to [email or web address] - Proofreader  [employer] Office  [phone]  CC: Primary care physician; Chrismon, Vickki Muff, PA

## 2018-04-02 NOTE — Clinical Social Work Placement (Signed)
   CLINICAL SOCIAL WORK PLACEMENT  NOTE  Date:  04/02/2018  Patient Details  Name: Glenn Godek Sr. MRN: [id number] Date of Birth: 1927/05/10  Clinical Social Work is seeking post-discharge placement for this patient at the [place] level of care (*CSW will initial, date and re-position this form in  chart as items are completed):  Yes   Patient/family provided with [place] Work Department's list of facilities offering this level of care within the geographic area requested by the patient (or if unable, by the patient's family).  Yes   Patient/family informed of their freedom to choose among providers that offer the needed level of care, that participate in Medicare, Medicaid or managed care program needed by the patient, have an available bed and are willing to accept the patient.  Yes   Patient/family informed of [facility]'s ownership interest in [facility] and [facility], as well as of the fact that they are under no obligation to receive care at these facilities.  PASRR submitted to EDS on 04/01/18     PASRR number received on       Existing PASRR number confirmed on 04/01/18     FL2 transmitted to all facilities in geographic area requested by pt/family on       FL2 transmitted to all facilities within larger geographic area on 04/01/18     Patient informed that his/her managed care company has contracts with or will negotiate with certain facilities, including the following:        Yes   Patient/family informed of bed offers received.  Patient chooses bed at [facility]     Physician recommends and patient chooses bed at      Patient to be transferred to [facility] on 04/02/18.  Patient to be transferred to facility by [facility]     Patient family notified on 04/02/18 of transfer.  Name of family member notified:  Alan Mulder patient's daughter.     PHYSICIAN        Additional Comment:    _______________________________________________ Ross Ludwig, LCSWA 04/02/2018, 4:48 PM

## 2018-04-02 NOTE — Clinical Social Work Note (Addendum)
CSW received phone call that patient's insurance company has approved for rehab.  CSW called patient's daughter to inform her, she is aware that patient is discharging today.  Patient to be d/c'ed today to [facility]. Patient and family agreeable to plans will transport via ems RN to call report [phone].  CSW updated patient's daughter [name] that patient will be discharging tonight.  Evette Cristal, MSW, [place]

## 2018-04-02 NOTE — Telephone Encounter (Signed)
TCM....  Patient is being discharged   They saw Dr Fletcher Anon  They are scheduled to see Ignacia Bayley on 04/15/18   They were seen for Tachycardia   They need to be seen within 2 weeks  Pt is not wait list   Please call

## 2018-04-02 NOTE — Progress Notes (Signed)
Patient discharged to [facility] via ems. Left on stretcher with son at side. VSS. On RA. IV and tele had previously been discontinued.

## 2018-04-05 NOTE — Telephone Encounter (Signed)
Patient contacted regarding discharge from [facility] on Thursday 04/02/18.   He states he is currently in rehab, but feeling well. He did not know which rehab he was in. He states that rehab is dispensing his medications.  He was unaware of his follow up with our office. He asked that I call his daughter- Alan Mulder to discuss as she coordinates his appointments. I attempted to call Yolanda at [phone], but caller was not accepting calls at this time.   Will attempt to call Yolanda back at a later time.

## 2018-04-06 DIAGNOSIS — N183 Chronic kidney disease, stage 3 unspecified: Secondary | ICD-10-CM | POA: Insufficient documentation

## 2018-04-06 DIAGNOSIS — I129 Hypertensive chronic kidney disease with stage 1 through stage 4 chronic kidney disease, or unspecified chronic kidney disease: Secondary | ICD-10-CM | POA: Diagnosis not present

## 2018-04-06 DIAGNOSIS — G629 Polyneuropathy, unspecified: Secondary | ICD-10-CM | POA: Diagnosis not present

## 2018-04-06 DIAGNOSIS — M109 Gout, unspecified: Secondary | ICD-10-CM | POA: Insufficient documentation

## 2018-04-06 DIAGNOSIS — I471 Supraventricular tachycardia: Secondary | ICD-10-CM | POA: Insufficient documentation

## 2018-04-08 ENCOUNTER — Inpatient Hospital Stay: Payer: Medicare HMO | Admitting: Family Medicine

## 2018-04-08 ENCOUNTER — Telehealth: Payer: Self-pay | Admitting: Family Medicine

## 2018-04-08 NOTE — Telephone Encounter (Signed)
Looks like Alvis Lemmings, RN contacted [name] today about his appointment with the Ignacia Bayley, NP at the cardiologist's office on 04-15-18 ad 2:30 pm and we will see him on 04-13-18 here.

## 2018-04-08 NOTE — Telephone Encounter (Signed)
Patient's daughter, [name] contacted regarding the patient's discharge from [facility] on Friday 04/02/18. [name] was contacted per the patient's request).  Patient [name]) understands to follow up with provider Ignacia Bayley, NP on Thursday 04/15/18 at 2:30 pm at the [place] office. Patient [name]) understands discharge instructions? yes Patient [name]) understands medications and regiment? yes Patient [name]) understands to bring all medications to this visit? Yes  Per [name], the patient is doing well. He has been at [facility] and is supposed to be discharged from there tomorrow and come home with her.

## 2018-04-08 NOTE — Telephone Encounter (Signed)
Alan Mulder, daughter, called stating Mr. Blunck was to be followed up by a cardiologist and that [facility] had talked to you about this.  [name] said she has not heard from you regarding this matter. Mr. Werth is being released tomorrow from [facility].

## 2018-04-10 DIAGNOSIS — M19012 Primary osteoarthritis, left shoulder: Secondary | ICD-10-CM | POA: Diagnosis not present

## 2018-04-10 DIAGNOSIS — N189 Chronic kidney disease, unspecified: Secondary | ICD-10-CM | POA: Diagnosis not present

## 2018-04-10 DIAGNOSIS — M19032 Primary osteoarthritis, left wrist: Secondary | ICD-10-CM | POA: Diagnosis not present

## 2018-04-10 DIAGNOSIS — I129 Hypertensive chronic kidney disease with stage 1 through stage 4 chronic kidney disease, or unspecified chronic kidney disease: Secondary | ICD-10-CM | POA: Diagnosis not present

## 2018-04-10 DIAGNOSIS — M1030 Gout due to renal impairment, unspecified site: Secondary | ICD-10-CM | POA: Diagnosis not present

## 2018-04-10 DIAGNOSIS — M19022 Primary osteoarthritis, left elbow: Secondary | ICD-10-CM | POA: Diagnosis not present

## 2018-04-12 ENCOUNTER — Other Ambulatory Visit: Payer: Self-pay

## 2018-04-12 DIAGNOSIS — M19022 Primary osteoarthritis, left elbow: Secondary | ICD-10-CM | POA: Diagnosis not present

## 2018-04-12 DIAGNOSIS — M19032 Primary osteoarthritis, left wrist: Secondary | ICD-10-CM | POA: Diagnosis not present

## 2018-04-12 DIAGNOSIS — M1030 Gout due to renal impairment, unspecified site: Secondary | ICD-10-CM | POA: Diagnosis not present

## 2018-04-12 DIAGNOSIS — N189 Chronic kidney disease, unspecified: Secondary | ICD-10-CM | POA: Diagnosis not present

## 2018-04-12 DIAGNOSIS — I129 Hypertensive chronic kidney disease with stage 1 through stage 4 chronic kidney disease, or unspecified chronic kidney disease: Secondary | ICD-10-CM | POA: Diagnosis not present

## 2018-04-12 DIAGNOSIS — M19012 Primary osteoarthritis, left shoulder: Secondary | ICD-10-CM | POA: Diagnosis not present

## 2018-04-12 NOTE — Patient Outreach (Addendum)
[facility]) Care Management  04/12/2018  Glenn Mulling Sr. [birth year] [id number]     Transition of Care Referral  Referral Date: 04/12/18 Referral Source: [facility] Discharge Report Date of Admission: unknown Diagnosis: unknown Date of Discharge: 04/09/18 Facility: [place]: [facility]    Outreach attempt # 1 to patient. Spoke with patient briefly as he reported he was leaving shortly to go to MD appt. He voices that his dtr primarily assists with his care and manages his medical affairs. He is unsure of d/c paperwork. Patient currently living with dtr and dtr works during the day and patient's son is there with him. Dtr has made his follow up appts and between his kids will be taking him to MD appts. Patient reports no issues or concerns regarding his meds as he is aware. His children help him manage meds as well. He denies any [facility] needs or concerns at this time.    Plan: RN CM will close case at this time as no further interventions needed.   Enzo Montgomery, RN,BSN,CCM [place] Management Telephonic Care Management Coordinator Direct Phone: [phone] Toll Free: [phone] Fax: [phone]

## 2018-04-13 ENCOUNTER — Telehealth: Payer: Self-pay | Admitting: Family Medicine

## 2018-04-13 ENCOUNTER — Inpatient Hospital Stay: Payer: Medicare HMO | Admitting: Family Medicine

## 2018-04-13 NOTE — Telephone Encounter (Signed)
Glenn Jarvis from [facility] is calling to get verbal orders for home health PT for twice a week for three weeks and one time a week for one week.

## 2018-04-14 DIAGNOSIS — M1030 Gout due to renal impairment, unspecified site: Secondary | ICD-10-CM | POA: Diagnosis not present

## 2018-04-14 DIAGNOSIS — N189 Chronic kidney disease, unspecified: Secondary | ICD-10-CM | POA: Diagnosis not present

## 2018-04-14 DIAGNOSIS — M19032 Primary osteoarthritis, left wrist: Secondary | ICD-10-CM | POA: Diagnosis not present

## 2018-04-14 DIAGNOSIS — M19022 Primary osteoarthritis, left elbow: Secondary | ICD-10-CM | POA: Diagnosis not present

## 2018-04-14 DIAGNOSIS — M19012 Primary osteoarthritis, left shoulder: Secondary | ICD-10-CM | POA: Diagnosis not present

## 2018-04-14 DIAGNOSIS — I129 Hypertensive chronic kidney disease with stage 1 through stage 4 chronic kidney disease, or unspecified chronic kidney disease: Secondary | ICD-10-CM | POA: Diagnosis not present

## 2018-04-15 ENCOUNTER — Ambulatory Visit: Payer: Medicare HMO | Admitting: Nurse Practitioner

## 2018-04-15 NOTE — Telephone Encounter (Signed)
Verbal orders given Skeet Simmer with [facility].

## 2018-04-15 NOTE — Telephone Encounter (Signed)
Proceed with PT at home twice a week for 3 weeks then one time for 1 week for weakness, gait training and balance issues.

## 2018-04-16 DIAGNOSIS — N189 Chronic kidney disease, unspecified: Secondary | ICD-10-CM | POA: Diagnosis not present

## 2018-04-16 DIAGNOSIS — I129 Hypertensive chronic kidney disease with stage 1 through stage 4 chronic kidney disease, or unspecified chronic kidney disease: Secondary | ICD-10-CM | POA: Diagnosis not present

## 2018-04-16 DIAGNOSIS — M19012 Primary osteoarthritis, left shoulder: Secondary | ICD-10-CM | POA: Diagnosis not present

## 2018-04-16 DIAGNOSIS — M19032 Primary osteoarthritis, left wrist: Secondary | ICD-10-CM | POA: Diagnosis not present

## 2018-04-16 DIAGNOSIS — M19022 Primary osteoarthritis, left elbow: Secondary | ICD-10-CM | POA: Diagnosis not present

## 2018-04-16 DIAGNOSIS — M1030 Gout due to renal impairment, unspecified site: Secondary | ICD-10-CM | POA: Diagnosis not present

## 2018-04-17 DIAGNOSIS — M1030 Gout due to renal impairment, unspecified site: Secondary | ICD-10-CM | POA: Diagnosis not present

## 2018-04-17 DIAGNOSIS — M19022 Primary osteoarthritis, left elbow: Secondary | ICD-10-CM | POA: Diagnosis not present

## 2018-04-17 DIAGNOSIS — M19032 Primary osteoarthritis, left wrist: Secondary | ICD-10-CM | POA: Diagnosis not present

## 2018-04-17 DIAGNOSIS — N189 Chronic kidney disease, unspecified: Secondary | ICD-10-CM | POA: Diagnosis not present

## 2018-04-17 DIAGNOSIS — I129 Hypertensive chronic kidney disease with stage 1 through stage 4 chronic kidney disease, or unspecified chronic kidney disease: Secondary | ICD-10-CM | POA: Diagnosis not present

## 2018-04-17 DIAGNOSIS — M19012 Primary osteoarthritis, left shoulder: Secondary | ICD-10-CM | POA: Diagnosis not present

## 2018-04-19 ENCOUNTER — Telehealth: Payer: Self-pay | Admitting: Family Medicine

## 2018-04-19 ENCOUNTER — Other Ambulatory Visit: Payer: Self-pay

## 2018-04-19 ENCOUNTER — Encounter: Payer: Self-pay | Admitting: Family Medicine

## 2018-04-19 ENCOUNTER — Ambulatory Visit (INDEPENDENT_AMBULATORY_CARE_PROVIDER_SITE_OTHER): Payer: Medicare HMO | Admitting: Family Medicine

## 2018-04-19 VITALS — BP 142/80 | HR 64 | Wt 138.0 lb

## 2018-04-19 DIAGNOSIS — N183 Chronic kidney disease, stage 3 unspecified: Secondary | ICD-10-CM

## 2018-04-19 DIAGNOSIS — R2689 Other abnormalities of gait and mobility: Secondary | ICD-10-CM

## 2018-04-19 DIAGNOSIS — I471 Supraventricular tachycardia: Secondary | ICD-10-CM

## 2018-04-19 DIAGNOSIS — M109 Gout, unspecified: Secondary | ICD-10-CM | POA: Diagnosis not present

## 2018-04-19 NOTE — Progress Notes (Signed)
Patient: Glenn Aultman Sr. Male    DOB: Aug 25, 1927   84 y.o.   MRN: [id number] Visit Date: 04/19/2018  Today's Provider: Vernie Murders, PA   Chief Complaint  Patient presents with  . Hospitalization Follow-up  . Tachycardia   Subjective:    HPI Hospitalized 03-29-18 to 04-02-18 with final diagnoses of paroxysmal supraventricular tachycardia, polyarticular gout, acute delirium from insomnia, acute kidney injury on chronic kidney disease stage III and essential hypertension. Insomnia and deliriums cleared with use of Melatonin and trazodone to re-establish sleep pattern. BP medications were adjusted and cardiologist placed him on Amiodarone after stopping the Metoprolol. Gout treated with prednisone for a short burst and Allopurinol added to the Colchicine. He was discharged to [facility] for rehab and discharged home 2 weeks ago. Feeling well and no longer having significant gout pain.    Past Medical History:  Diagnosis Date  . Arthritis   . GERD (gastroesophageal reflux disease)   . Hemorrhoid 1986  . Hyperlipidemia   . Hypertension   . Thyroid disease   . Vertigo    Past Surgical History:  Procedure Laterality Date  . APPENDECTOMY  1968  . BACK SURGERY    . COLONOSCOPY N/A 11/27/2017   Procedure: COLONOSCOPY;  Surgeon: Toledo, Benay Pike, MD;  Location: [facility] ENDOSCOPY;  Service: Gastroenterology;  Laterality: N/A;  . ESOPHAGOGASTRODUODENOSCOPY (EGD) WITH PROPOFOL N/A 11/25/2017   Procedure: ESOPHAGOGASTRODUODENOSCOPY (EGD) WITH PROPOFOL;  Surgeon: Toledo, Benay Pike, MD;  Location: [facility] ENDOSCOPY;  Service: Gastroenterology;  Laterality: N/A;  . EXCISIONAL HEMORRHOIDECTOMY    . INGUINAL HERNIA REPAIR Right 03/17/2016   Procedure: LAPAROSCOPIC INGUINAL HERNIA;  Surgeon: Florene Glen, MD;  Location: [facility] ORS;  Service: General;  Laterality: Right;  . SPINE SURGERY  1984   Family History  Problem Relation Age of Onset  . Alzheimer's disease Father   . Healthy  Sister    Allergies  Allergen Reactions  . Codeine   . Cortizone-10  [Hydrocortisone]     Current Outpatient Medications:  .  allopurinol (ZYLOPRIM) 100 MG tablet, Take 0.5 tablets (50 mg total) by mouth daily., Disp: 30 tablet, Rfl: 0 .  amiodarone (PACERONE) 200 MG tablet, Take 1 tablet (200 mg total) by mouth daily., Disp: 30 tablet, Rfl: 0 .  amLODipine (NORVASC) 5 MG tablet, Take 1 tablet (5 mg total) by mouth daily., Disp: 30 tablet, Rfl: 0 .  colchicine 0.6 MG tablet, Take 0.5 tablets (0.3 mg total) by mouth daily., Disp: 15 tablet, Rfl: 0 .  gabapentin (NEURONTIN) 100 MG capsule, Take 100 mg by mouth at bedtime., Disp: , Rfl:  .  levothyroxine (SYNTHROID, LEVOTHROID) 50 MCG tablet, TAKE 1 TABLET BY MOUTH ONCE DAILY FOR THYROID, ON AN EMPTY STOMACH. WAIT 30 MINUTES BEFORE TAKING OTHER MEDS., Disp: 90 tablet, Rfl: 3 .  Melatonin 5 MG TABS, Take 1 tablet (5 mg total) by mouth at bedtime., Disp: 30 tablet, Rfl: 0 .  pantoprazole (PROTONIX) 40 MG tablet, Take 1 tablet (40 mg total) by mouth daily., Disp: 90 tablet, Rfl: 3 .  traZODone (DESYREL) 50 MG tablet, Take 1 tablet (50 mg total) by mouth at bedtime., Disp: 30 tablet, Rfl: 0 .  triamcinolone (KENALOG) 0.025 % cream, TRIAMCINOLONE ACETONIDE, 0.025% (External Cream)  1 (one) Cream Cream apply daily as needed for 0 days  Quantity: 60;  Refills: 0   Ordered :13-Apr-2014  [name] ;  Started 13-Apr-2014 Active Comments: Medication taken as needed. , Disp: ,  Rfl:   Review of Systems  Social History   Tobacco Use  . Smoking status: Former Smoker    Packs/day: 2.00    Years: 25.00    Pack years: 50.00    Last attempt to quit: 12/28/1965    Years since quitting: 52.3  . Smokeless tobacco: Never Used  Substance Use Topics  . Alcohol use: No    Frequency: Never    Comment: 1 beer occasionally   Objective:   BP (!) 142/80 (BP Location: Right Arm, Patient Position: Sitting, Cuff Size: Normal)   Pulse 64   Wt 138 lb (62.6  kg)   SpO2 98%   BMI 18.21 kg/m  Vitals:   04/19/18 0831  BP: (!) 142/80  Pulse: 64  SpO2: 98%  Weight: 138 lb (62.6 kg)   Physical Exam  Constitutional: He is oriented to person, place, and time. He appears well-developed and well-nourished. No distress.  HENT:  Head: Normocephalic and atraumatic.  Right Ear: Hearing normal.  Left Ear: Hearing normal.  Nose: Nose normal.  Eyes: Conjunctivae and lids are normal. Right eye exhibits no discharge. Left eye exhibits no discharge. No scleral icterus.  Cardiovascular: Normal rate and regular rhythm.  Pulmonary/Chest: Effort normal and breath sounds normal. No respiratory distress.  Abdominal: Soft. Bowel sounds are normal.  Musculoskeletal: Normal range of motion.  Neurological: He is alert and oriented to person, place, and time. Coordination abnormal.  Slow gait with balance a little unstable. Walks with a cane fairly well.  Skin: Skin is intact. No lesion and no rash noted.  Psychiatric: He has a normal mood and affect. His speech is normal and behavior is normal. Thought content normal.       Assessment & Plan:     1. PSVT (paroxysmal supraventricular tachycardia) (HCC) Stable BP and regular heart rate on the Amlodipine 5 mg qd and Amiodarone 200 mg qd. Keep appointment with cardiology on 05-14-18 at 2:00pm with Murray Hodgkins, NP. Recheck CBC and CMP. Follow up pending reports. - CBC with Differential/Platelet - Comprehensive metabolic panel  2. Balance problem Slow gait with feeling off balance. Walks fairly well with use of a cane. Continue PT at home for gait training and improvement in strength.  3. Chronic kidney disease (CKD), stage III (moderate) (HCC) Eating fairly well three times a day. States he he feels well. Will check labs and encouraged to keep follow up appointments with Dr. Candiss Norse (nephrologist). - CBC with Differential/Platelet - Comprehensive metabolic panel  4. Gout of multiple sites, unspecified  cause, unspecified chronicity Minimal swelling around the left ankle with stiffness. Continues to take Allopurinol 100 mg qd with Colchicine 0.3 mg qd. Will recheck uric acid level and CBC with CMP. - CBC with Differential/Platelet - Uric acid       Vernie Murders, PA  [facility]

## 2018-04-19 NOTE — Telephone Encounter (Signed)
Pt wanting to know if it is okay to take Miralax for constipation.

## 2018-04-20 LAB — CBC WITH DIFFERENTIAL/PLATELET
BASOS: 0 %
Basophils Absolute: 0 10*3/uL (ref 0.0–0.2)
EOS (ABSOLUTE): 0 10*3/uL (ref 0.0–0.4)
EOS: 0 %
HEMATOCRIT: 29.9 % — AB (ref 37.5–51.0)
HEMOGLOBIN: 9.6 g/dL — AB (ref 13.0–17.7)
Immature Grans (Abs): 0 10*3/uL (ref 0.0–0.1)
Immature Granulocytes: 0 %
LYMPHS ABS: 1.7 10*3/uL (ref 0.7–3.1)
Lymphs: 31 %
MCH: 28.6 pg (ref 26.6–33.0)
MCHC: 32.1 g/dL (ref 31.5–35.7)
MCV: 89 fL (ref 79–97)
MONOS ABS: 0.5 10*3/uL (ref 0.1–0.9)
Monocytes: 10 %
Neutrophils Absolute: 3.2 10*3/uL (ref 1.4–7.0)
Neutrophils: 59 %
Platelets: 95 10*3/uL — CL (ref 150–379)
RBC: 3.36 x10E6/uL — ABNORMAL LOW (ref 4.14–5.80)
RDW: 16.4 % — ABNORMAL HIGH (ref 12.3–15.4)
WBC: 5.5 10*3/uL (ref 3.4–10.8)

## 2018-04-20 LAB — COMPREHENSIVE METABOLIC PANEL
ALBUMIN: 3.7 g/dL (ref 3.5–4.7)
ALK PHOS: 117 IU/L (ref 39–117)
ALT: 26 IU/L (ref 0–44)
AST: 26 IU/L (ref 0–40)
Albumin/Globulin Ratio: 1.2 (ref 1.2–2.2)
BILIRUBIN TOTAL: 0.4 mg/dL (ref 0.0–1.2)
BUN / CREAT RATIO: 8 — AB (ref 10–24)
BUN: 19 mg/dL (ref 8–27)
CO2: 24 mmol/L (ref 20–29)
Calcium: 8.9 mg/dL (ref 8.6–10.2)
Chloride: 103 mmol/L (ref 96–106)
Creatinine, Ser: 2.36 mg/dL — ABNORMAL HIGH (ref 0.76–1.27)
GFR calc Af Amer: 27 mL/min/{1.73_m2} — ABNORMAL LOW (ref >59)
GFR calc non Af Amer: 24 mL/min/{1.73_m2} — ABNORMAL LOW (ref >59)
GLOBULIN, TOTAL: 3.1 g/dL (ref 1.5–4.5)
Glucose: 85 mg/dL (ref 65–99)
Potassium: 4.3 mmol/L (ref 3.5–5.2)
SODIUM: 140 mmol/L (ref 134–144)
Total Protein: 6.8 g/dL (ref 6.0–8.5)

## 2018-04-20 LAB — URIC ACID: URIC ACID: 7.2 mg/dL (ref 3.7–8.6)

## 2018-04-20 NOTE — Telephone Encounter (Signed)
Certainly

## 2018-04-20 NOTE — Telephone Encounter (Signed)
Advised patient it was ok to take Miralax.

## 2018-04-21 DIAGNOSIS — I129 Hypertensive chronic kidney disease with stage 1 through stage 4 chronic kidney disease, or unspecified chronic kidney disease: Secondary | ICD-10-CM | POA: Diagnosis not present

## 2018-04-21 DIAGNOSIS — M19032 Primary osteoarthritis, left wrist: Secondary | ICD-10-CM | POA: Diagnosis not present

## 2018-04-21 DIAGNOSIS — N189 Chronic kidney disease, unspecified: Secondary | ICD-10-CM | POA: Diagnosis not present

## 2018-04-21 DIAGNOSIS — M19022 Primary osteoarthritis, left elbow: Secondary | ICD-10-CM | POA: Diagnosis not present

## 2018-04-21 DIAGNOSIS — M19012 Primary osteoarthritis, left shoulder: Secondary | ICD-10-CM | POA: Diagnosis not present

## 2018-04-21 DIAGNOSIS — M1030 Gout due to renal impairment, unspecified site: Secondary | ICD-10-CM | POA: Diagnosis not present

## 2018-04-21 LAB — IRON AND TIBC
IRON SATURATION: 31 % (ref 15–55)
IRON: 68 ug/dL (ref 38–169)
TIBC: 219 ug/dL — AB (ref 250–450)
UIBC: 151 ug/dL (ref 111–343)

## 2018-04-21 LAB — SPECIMEN STATUS REPORT

## 2018-04-22 ENCOUNTER — Telehealth: Payer: Self-pay | Admitting: Family Medicine

## 2018-04-22 ENCOUNTER — Other Ambulatory Visit: Payer: Self-pay | Admitting: Family Medicine

## 2018-04-22 DIAGNOSIS — D508 Other iron deficiency anemias: Secondary | ICD-10-CM

## 2018-04-22 DIAGNOSIS — M19012 Primary osteoarthritis, left shoulder: Secondary | ICD-10-CM | POA: Diagnosis not present

## 2018-04-22 DIAGNOSIS — M19022 Primary osteoarthritis, left elbow: Secondary | ICD-10-CM | POA: Diagnosis not present

## 2018-04-22 DIAGNOSIS — N189 Chronic kidney disease, unspecified: Secondary | ICD-10-CM | POA: Diagnosis not present

## 2018-04-22 DIAGNOSIS — M19032 Primary osteoarthritis, left wrist: Secondary | ICD-10-CM | POA: Diagnosis not present

## 2018-04-22 DIAGNOSIS — I129 Hypertensive chronic kidney disease with stage 1 through stage 4 chronic kidney disease, or unspecified chronic kidney disease: Secondary | ICD-10-CM | POA: Diagnosis not present

## 2018-04-22 DIAGNOSIS — M1030 Gout due to renal impairment, unspecified site: Secondary | ICD-10-CM | POA: Diagnosis not present

## 2018-04-22 DIAGNOSIS — I471 Supraventricular tachycardia: Secondary | ICD-10-CM

## 2018-04-22 MED ORDER — FERROUS SULFATE DRIED ER 160 (50 FE) MG PO TBCR
160.0000 mg | EXTENDED_RELEASE_TABLET | Freq: Every day | ORAL | 2 refills | Status: DC
Start: 2018-04-22 — End: 2018-12-31

## 2018-04-22 NOTE — Telephone Encounter (Signed)
Left message for Glenn Jarvis advising her that RX has been sent to [facility].

## 2018-04-22 NOTE — Telephone Encounter (Signed)
Pt's daughter [name] stated she just spoke with Lexine Baton and was advised of pt's iron and advised what pt could try. [name] is requesting if Simona Huh would send an Rx for iron pill to [facility] because they will deliver the medication to pt's home. [name] stated that she can't get out tonight to get pt the OTC because of her family situation. Please advise. Thanks TNP

## 2018-04-22 NOTE — Telephone Encounter (Signed)
Ferrous Sulfate 325 mg qd #30 & 2RF sent to [facility].

## 2018-04-23 DIAGNOSIS — I129 Hypertensive chronic kidney disease with stage 1 through stage 4 chronic kidney disease, or unspecified chronic kidney disease: Secondary | ICD-10-CM | POA: Diagnosis not present

## 2018-04-23 DIAGNOSIS — M19032 Primary osteoarthritis, left wrist: Secondary | ICD-10-CM | POA: Diagnosis not present

## 2018-04-23 DIAGNOSIS — M1030 Gout due to renal impairment, unspecified site: Secondary | ICD-10-CM | POA: Diagnosis not present

## 2018-04-23 DIAGNOSIS — M19012 Primary osteoarthritis, left shoulder: Secondary | ICD-10-CM | POA: Diagnosis not present

## 2018-04-23 DIAGNOSIS — M19022 Primary osteoarthritis, left elbow: Secondary | ICD-10-CM | POA: Diagnosis not present

## 2018-04-23 DIAGNOSIS — N189 Chronic kidney disease, unspecified: Secondary | ICD-10-CM | POA: Diagnosis not present

## 2018-04-26 DIAGNOSIS — I129 Hypertensive chronic kidney disease with stage 1 through stage 4 chronic kidney disease, or unspecified chronic kidney disease: Secondary | ICD-10-CM | POA: Diagnosis not present

## 2018-04-26 DIAGNOSIS — M19022 Primary osteoarthritis, left elbow: Secondary | ICD-10-CM | POA: Diagnosis not present

## 2018-04-26 DIAGNOSIS — M19032 Primary osteoarthritis, left wrist: Secondary | ICD-10-CM | POA: Diagnosis not present

## 2018-04-26 DIAGNOSIS — N189 Chronic kidney disease, unspecified: Secondary | ICD-10-CM | POA: Diagnosis not present

## 2018-04-26 DIAGNOSIS — M19012 Primary osteoarthritis, left shoulder: Secondary | ICD-10-CM | POA: Diagnosis not present

## 2018-04-26 DIAGNOSIS — M1030 Gout due to renal impairment, unspecified site: Secondary | ICD-10-CM | POA: Diagnosis not present

## 2018-04-28 DIAGNOSIS — M1030 Gout due to renal impairment, unspecified site: Secondary | ICD-10-CM | POA: Diagnosis not present

## 2018-04-28 DIAGNOSIS — M19012 Primary osteoarthritis, left shoulder: Secondary | ICD-10-CM | POA: Diagnosis not present

## 2018-04-28 DIAGNOSIS — N189 Chronic kidney disease, unspecified: Secondary | ICD-10-CM | POA: Diagnosis not present

## 2018-04-28 DIAGNOSIS — M19032 Primary osteoarthritis, left wrist: Secondary | ICD-10-CM | POA: Diagnosis not present

## 2018-04-28 DIAGNOSIS — I129 Hypertensive chronic kidney disease with stage 1 through stage 4 chronic kidney disease, or unspecified chronic kidney disease: Secondary | ICD-10-CM | POA: Diagnosis not present

## 2018-04-28 DIAGNOSIS — M19022 Primary osteoarthritis, left elbow: Secondary | ICD-10-CM | POA: Diagnosis not present

## 2018-04-30 DIAGNOSIS — M19012 Primary osteoarthritis, left shoulder: Secondary | ICD-10-CM | POA: Diagnosis not present

## 2018-04-30 DIAGNOSIS — I129 Hypertensive chronic kidney disease with stage 1 through stage 4 chronic kidney disease, or unspecified chronic kidney disease: Secondary | ICD-10-CM | POA: Diagnosis not present

## 2018-04-30 DIAGNOSIS — M1030 Gout due to renal impairment, unspecified site: Secondary | ICD-10-CM | POA: Diagnosis not present

## 2018-04-30 DIAGNOSIS — N189 Chronic kidney disease, unspecified: Secondary | ICD-10-CM | POA: Diagnosis not present

## 2018-04-30 DIAGNOSIS — M19032 Primary osteoarthritis, left wrist: Secondary | ICD-10-CM | POA: Diagnosis not present

## 2018-04-30 DIAGNOSIS — M19022 Primary osteoarthritis, left elbow: Secondary | ICD-10-CM | POA: Diagnosis not present

## 2018-05-04 DIAGNOSIS — I129 Hypertensive chronic kidney disease with stage 1 through stage 4 chronic kidney disease, or unspecified chronic kidney disease: Secondary | ICD-10-CM | POA: Diagnosis not present

## 2018-05-04 DIAGNOSIS — M19022 Primary osteoarthritis, left elbow: Secondary | ICD-10-CM | POA: Diagnosis not present

## 2018-05-04 DIAGNOSIS — N189 Chronic kidney disease, unspecified: Secondary | ICD-10-CM | POA: Diagnosis not present

## 2018-05-04 DIAGNOSIS — M19032 Primary osteoarthritis, left wrist: Secondary | ICD-10-CM | POA: Diagnosis not present

## 2018-05-04 DIAGNOSIS — M19012 Primary osteoarthritis, left shoulder: Secondary | ICD-10-CM | POA: Diagnosis not present

## 2018-05-04 DIAGNOSIS — M1030 Gout due to renal impairment, unspecified site: Secondary | ICD-10-CM | POA: Diagnosis not present

## 2018-05-12 ENCOUNTER — Encounter: Payer: Self-pay | Admitting: Physician Assistant

## 2018-05-12 NOTE — Progress Notes (Signed)
Cardiology Office Note Date:  05/14/2018  Patient ID:  Glenn Smolinsky Sr., DOB September 22, 1927, MRN [id number] PCP:  Margo Common, PA  Cardiologist:  Dr. Fletcher Anon, MD    Chief Complaint: Hospital follow up  History of Present Illness: Glenn Guidice. is a 84 y.o. male with history of recently diagnosed SVT, CKD stage IV, anemia of chronic disease, HTN, HLD, GERD, and gout who presents for hospital follow up after recent admission to [facility] from 4/1-4/5 for SVT in the setting of acute gout flare.   Prior echo in 10/2017 showed an EF of 60-65%, normal wall motion, normal LV diastolic function.   Patient initially presented to the ED for left arm and hand pain/swelling felt to be related to his gout. While in the ED he developed tachycardia with a heart rate of 144 bpm that improved with IV diltiazem. His EKG was suggestive of SVT. Troponin was minimally elevated at 0.04 and felt to be demand ischemia in the setting of his SVT and CKD. Potassium was noted to be 3.9, SCr 2.28, WBC 11.7, HGB 9.6, uric acid 8.5, TSH normal. He continued to have episodes of SVT during his admission despite escalation of beta blocker. He was fairly asymptomatic during these episodes. He was started on amiodarone prior to discharge in an effort to decrease his ectopy burden. Following this, his heart rate did drop into the 40s bpm while on both amiodarone and beta blocker leading to stopping of beta blocker and decreasing of amiodarone. He was discharged on amiodarone 200 mg daily, amlodipine 5 mg daily.  Follow-up labs at his PCP on 4/22 demonstrated a serum creatinine of 2.36, potassium 4.3, normal liver function, WBC 5.5, Hgb 9.6, PLT 95, uric acid 7.2, TIBC 219.  He comes in doing well today.  No palpitations.  No chest pain, nausea, vomiting, dizziness, diaphoresis, presyncope, or syncope.  He was never able to feel episodes of SVT as above.  Weight has been stable at home.  Tolerating amiodarone without issues.   Blood pressure well controlled today.  Heart rate well controlled.  No orthopnea, lower extremity swelling, or early satiety.  Both patient and son feel like he is doing well today.   Past Medical History:  Diagnosis Date  . Arthritis   . GERD (gastroesophageal reflux disease)   . Hemorrhoid 1986  . Hyperlipidemia   . Hypertension   . SVT (supraventricular tachycardia) ([place])   . Thyroid disease   . Vertigo     Past Surgical History:  Procedure Laterality Date  . APPENDECTOMY  1968  . BACK SURGERY    . COLONOSCOPY N/A 11/27/2017   Procedure: COLONOSCOPY;  Surgeon: Toledo, Benay Pike, MD;  Location: [facility] ENDOSCOPY;  Service: Gastroenterology;  Laterality: N/A;  . ESOPHAGOGASTRODUODENOSCOPY (EGD) WITH PROPOFOL N/A 11/25/2017   Procedure: ESOPHAGOGASTRODUODENOSCOPY (EGD) WITH PROPOFOL;  Surgeon: Toledo, Benay Pike, MD;  Location: [facility] ENDOSCOPY;  Service: Gastroenterology;  Laterality: N/A;  . EXCISIONAL HEMORRHOIDECTOMY    . INGUINAL HERNIA REPAIR Right 03/17/2016   Procedure: LAPAROSCOPIC INGUINAL HERNIA;  Surgeon: Florene Glen, MD;  Location: [facility] ORS;  Service: General;  Laterality: Right;  . SPINE SURGERY  1984    Current Meds  Medication Sig  . allopurinol (ZYLOPRIM) 100 MG tablet Take 0.5 tablets (50 mg total) by mouth daily.  Marland Kitchen amiodarone (PACERONE) 200 MG tablet Take 1 tablet (200 mg total) by mouth daily.  Marland Kitchen amLODipine (NORVASC) 5 MG tablet Take 1 tablet (5 mg total) by mouth  daily.  . colchicine 0.6 MG tablet Take 0.5 tablets (0.3 mg total) by mouth daily.  . ferrous sulfate (SLOW FE) 160 (50 Fe) MG TBCR SR tablet Take 1 tablet (160 mg total) by mouth daily.  Marland Kitchen gabapentin (NEURONTIN) 100 MG capsule Take 100 mg by mouth at bedtime.  Marland Kitchen levothyroxine (SYNTHROID, LEVOTHROID) 50 MCG tablet TAKE 1 TABLET BY MOUTH ONCE DAILY FOR THYROID, ON AN EMPTY STOMACH. WAIT 30 MINUTES BEFORE TAKING OTHER MEDS.  . Melatonin 5 MG TABS Take 1 tablet (5 mg total) by mouth at bedtime.  .  pantoprazole (PROTONIX) 40 MG tablet Take 1 tablet (40 mg total) by mouth daily.  . traZODone (DESYREL) 50 MG tablet Take 1 tablet (50 mg total) by mouth at bedtime.  . triamcinolone (KENALOG) 0.025 % cream TRIAMCINOLONE ACETONIDE, 0.025% (External Cream)  1 (one) Cream Cream apply daily as needed for 0 days  Quantity: 60;  Refills: 0   Ordered :13-Apr-2014  Glenn Jarvis ;  Started 13-Apr-2014 Active Comments: Medication taken as needed.     Allergies:   Codeine and Cortizone-10  [hydrocortisone]   Social History:  The patient  reports that he quit smoking about 52 years ago. He has a 50.00 pack-year smoking history. He has never used smokeless tobacco. He reports that he does not drink alcohol or use drugs.   Family History:  The patient's family history includes Alzheimer's disease in his father; Healthy in his sister.  ROS:   Review of Systems  Constitutional: Positive for malaise/fatigue. Negative for chills, diaphoresis, fever and weight loss.  HENT: Negative for congestion.   Eyes: Negative for discharge and redness.  Respiratory: Positive for shortness of breath. Negative for cough, hemoptysis, sputum production and wheezing.   Cardiovascular: Negative for chest pain, palpitations, orthopnea, claudication, leg swelling and PND.  Gastrointestinal: Negative for abdominal pain, blood in stool, heartburn, melena, nausea and vomiting.  Genitourinary: Negative for hematuria.  Musculoskeletal: Negative for falls and myalgias.  Skin: Negative for rash.  Neurological: Positive for dizziness and weakness. Negative for tingling, tremors, sensory change, speech change, focal weakness and loss of consciousness.  Endo/Heme/Allergies: Does not bruise/bleed easily.  Psychiatric/Behavioral: Negative for substance abuse. The patient is not nervous/anxious.   All other systems reviewed and are negative.    PHYSICAL EXAM:  VS:  BP 128/64 (BP Location: Left Arm, Patient Position: Sitting,  Cuff Size: Normal)   Pulse 61   Ht 5\' 7"  (1.702 m)   Wt 139 lb 4 oz (63.2 kg)   BMI 21.81 kg/m  BMI: Body mass index is 21.81 kg/m.  Physical Exam  Constitutional: He is oriented to person, place, and time. He appears well-developed and well-nourished.  HENT:  Head: Normocephalic and atraumatic.  Eyes: Right eye exhibits no discharge. Left eye exhibits no discharge.  Neck: Normal range of motion. No JVD present.  Cardiovascular: Normal rate, regular rhythm, S1 normal, S2 normal and normal heart sounds. Exam reveals no distant heart sounds, no friction rub, no midsystolic click and no opening snap.  No murmur heard. Pulmonary/Chest: Effort normal and breath sounds normal. No respiratory distress. He has no decreased breath sounds. He has no wheezes. He has no rales. He exhibits no tenderness.  Abdominal: Soft. He exhibits no distension. There is no tenderness.  Musculoskeletal: He exhibits no edema.  Neurological: He is alert and oriented to person, place, and time.  Skin: Skin is warm and dry. No cyanosis. Nails show no clubbing.  Psychiatric: He has a normal mood  and affect. His speech is normal and behavior is normal. Judgment and thought content normal.     EKG:  Was ordered and interpreted by me today. Shows NSR, 61 bpm, rare PAC, QTc 422, nonspecific ST-T changes along lateral leads  Recent Labs: 04/01/2018: TSH 2.130 04/19/2018: ALT 26; BUN 19; Creatinine, Ser 2.36; Hemoglobin 9.6; Platelets 95; Potassium 4.3; Sodium 140  No results found for requested labs within last 8760 hours.   CrCl cannot be calculated (Patient's most recent lab result is older than the maximum 21 days allowed.).   Wt Readings from Last 3 Encounters:  05/14/18 139 lb 4 oz (63.2 kg)  04/19/18 138 lb (62.6 kg)  03/29/18 150 lb (68 kg)     Other studies reviewed: Additional studies/records reviewed today include: summarized above  ASSESSMENT AND PLAN:  1. Paroxysmal SVT: Currently maintaining sinus  rhythm with a heart rate in the 60s bpm with a rare PAC noted on 12-lead.  Continue amiodarone 200 mg daily for rhythm control given the patient was asymptomatic during his episodes of SVT.  The use of amiodarone should hopefully decrease his ectopy burden which ultimately may limit episodes of tachycardia mediated cardiomyopathy.  Should he have breakthrough episodes of SVT while on antiarrhythmic therapy we could consider EP referral for possible SVT ablation however given the patient's advanced age and questionable mental status he may not be an ideal candidate for this.  2. History of elevated troponin: Level of 0.04 during admission, not cycled.  Never with symptoms concerning for angina.  Felt to be secondary to supply demand ischemia in the setting of his SVT and CKD.  Recent echocardiogram from 10/2017 demonstrate a preserved LV systolic function.  Check echocardiogram to evaluate for preserved LV systolic function in the setting of his shortness of breath.  Though, I suspect his shortness of breath is likely multifactorial including anemia of chronic disease, advanced age, and physical deconditioning.  3. High risk medication: Baseline and recent liver function unremarkable.  Baseline TSH normal.  Recommend patient follow-up with ophthalmology for annual eye exams.  QTc stable as above.  4. Essential hypertension: Blood pressure today well-controlled as above.  Continue current medication per  5. CKD stage IV: Followed by nephrology.  Recent BMP from 4/22 demonstrated stable serum creatinine.  6. Anemia of chronic disease: Per PCP.  7. Thrombocytopenia: Per PCP.  Disposition: F/u with Dr. Fletcher Anon in 3 months.  Current medicines are reviewed at length with the patient today.  The patient did not have any concerns regarding medicines.  Signed, Christell Faith, PA-C 05/14/2018 2:20 PM     [facility] [address] [place], [place] [address] [phone]

## 2018-05-14 ENCOUNTER — Encounter: Payer: Self-pay | Admitting: Physician Assistant

## 2018-05-14 ENCOUNTER — Ambulatory Visit (INDEPENDENT_AMBULATORY_CARE_PROVIDER_SITE_OTHER): Payer: Medicare HMO | Admitting: Physician Assistant

## 2018-05-14 VITALS — BP 128/64 | HR 61 | Ht 67.0 in | Wt 139.2 lb

## 2018-05-14 DIAGNOSIS — R7989 Other specified abnormal findings of blood chemistry: Secondary | ICD-10-CM

## 2018-05-14 DIAGNOSIS — I471 Supraventricular tachycardia: Secondary | ICD-10-CM

## 2018-05-14 DIAGNOSIS — Z79899 Other long term (current) drug therapy: Secondary | ICD-10-CM

## 2018-05-14 DIAGNOSIS — D696 Thrombocytopenia, unspecified: Secondary | ICD-10-CM | POA: Diagnosis not present

## 2018-05-14 DIAGNOSIS — I10 Essential (primary) hypertension: Secondary | ICD-10-CM | POA: Diagnosis not present

## 2018-05-14 DIAGNOSIS — N184 Chronic kidney disease, stage 4 (severe): Secondary | ICD-10-CM

## 2018-05-14 DIAGNOSIS — R0602 Shortness of breath: Secondary | ICD-10-CM | POA: Diagnosis not present

## 2018-05-14 DIAGNOSIS — D638 Anemia in other chronic diseases classified elsewhere: Secondary | ICD-10-CM

## 2018-05-14 DIAGNOSIS — R748 Abnormal levels of other serum enzymes: Secondary | ICD-10-CM

## 2018-05-14 DIAGNOSIS — R778 Other specified abnormalities of plasma proteins: Secondary | ICD-10-CM

## 2018-05-14 NOTE — Patient Instructions (Signed)
Medication Instructions:  Your physician recommends that you continue on your current medications as directed. Please refer to the Current Medication list given to you today.   Labwork: none  Testing/Procedures: Your physician has requested that you have an echocardiogram. Echocardiography is a painless test that uses sound waves to create images of your heart. It provides your doctor with information about the size and shape of your heart and how well your heart's chambers and valves are working. This procedure takes approximately one hour. There are no restrictions for this procedure. You may get an IV if needed to receive an ultrasound enhancing agent through to better visualize your heart.    Follow-Up: Your physician recommends that you schedule a follow-up appointment in: [place].    If you need a refill on your cardiac medications before your next appointment, please call your pharmacy.     Echocardiogram An echocardiogram, or echocardiography, uses sound waves (ultrasound) to produce an image of your heart. The echocardiogram is simple, painless, obtained within a short period of time, and offers valuable information to your health care provider. The images from an echocardiogram can provide information such as:  Evidence of coronary artery disease (CAD).  Heart size.  Heart muscle function.  Heart valve function.  Aneurysm detection.  Evidence of a past heart attack.  Fluid buildup around the heart.  Heart muscle thickening.  Assess heart valve function.  Tell a health care provider about:  Any allergies you have.  All medicines you are taking, including vitamins, herbs, eye drops, creams, and over-the-counter medicines.  Any problems you or family members have had with anesthetic medicines.  Any blood disorders you have.  Any surgeries you have had.  Any medical conditions you have.  Whether you are pregnant or may be pregnant. What  happens before the procedure? No special preparation is needed. Eat and drink normally. What happens during the procedure?  In order to produce an image of your heart, gel will be applied to your chest and a wand-like tool (transducer) will be moved over your chest. The gel will help transmit the sound waves from the transducer. The sound waves will harmlessly bounce off your heart to allow the heart images to be captured in real-time motion. These images will then be recorded.  You may need an IV to receive a medicine that improves the quality of the pictures. What happens after the procedure? You may return to your normal schedule including diet, activities, and medicines, unless your health care provider tells you otherwise. This information is not intended to replace advice given to you by your health care provider. Make sure you discuss any questions you have with your health care provider. Document Released: 12/12/2000 Document Revised: 08/02/2016 Document Reviewed: 08/22/2013 Elsevier Interactive Patient Education  2017 [employer].

## 2018-05-28 ENCOUNTER — Other Ambulatory Visit: Payer: Self-pay

## 2018-05-28 MED ORDER — AMLODIPINE BESYLATE 5 MG PO TABS: 5.0000 mg | ORAL_TABLET | Freq: Every day | ORAL | 1 refills | Status: DC

## 2018-05-28 MED ORDER — AMIODARONE HCL 200 MG PO TABS: 200.0000 mg | ORAL_TABLET | Freq: Every day | ORAL | 1 refills | Status: DC

## 2018-05-28 MED ORDER — TRAZODONE HCL 50 MG PO TABS: 50.0000 mg | ORAL_TABLET | Freq: Every day | ORAL | 1 refills | Status: DC

## 2018-05-28 NOTE — Telephone Encounter (Signed)
Glenn Jarvis sent a refill request for Trazodone 50 mg,  Amlodipine 5 mg and Amiodarone 200 mg

## 2018-06-01 ENCOUNTER — Ambulatory Visit (INDEPENDENT_AMBULATORY_CARE_PROVIDER_SITE_OTHER): Payer: Medicare HMO

## 2018-06-01 ENCOUNTER — Other Ambulatory Visit: Payer: Self-pay

## 2018-06-01 DIAGNOSIS — R0602 Shortness of breath: Secondary | ICD-10-CM | POA: Diagnosis not present

## 2018-06-17 ENCOUNTER — Other Ambulatory Visit: Payer: Self-pay | Admitting: Family Medicine

## 2018-06-17 MED ORDER — ALLOPURINOL 100 MG PO TABS: 50.0000 mg | ORAL_TABLET | Freq: Every day | ORAL | 3 refills | Status: DC

## 2018-06-17 NOTE — Telephone Encounter (Signed)
[facility] faxed a refill request for the following medication. Thanks CC  allopurinol (ZYLOPRIM) 100 MG tablet

## 2018-06-24 ENCOUNTER — Ambulatory Visit: Payer: Medicare HMO

## 2018-08-06 ENCOUNTER — Ambulatory Visit: Payer: Medicare HMO | Admitting: Cardiovascular Disease

## 2018-08-09 ENCOUNTER — Encounter: Payer: Self-pay | Admitting: Cardiovascular Disease

## 2018-08-12 NOTE — Progress Notes (Signed)
     Patient's daughter Alan Mulder is here today concerned about her father having anger problems. The patient is not with her today. He liver at her house and she reports he is very frustrated that he is not physically able to care for himself and cannot liver in his own home anymore. He has no problems at all with memory or confusion and is very cognisent of his surroundings. He never forgets names or recent conversations. However she is his primary caregiver and he frequently lashes out verbally creating a very stressful environment. She states she doesn't want to put him a home, but might have to if his anger issues do not improve. She is wondering if there is a pill that could help. She states that the patient keeps very close track of his medications and she is not sure if he would take a medication.  Advised her that he needs to come in for evaluation. Printed prescription for fluoxetine to try if patient is agreeable. Recommended she and her siblings talk to patient about trying medication. If he is agreeable then return in about a month for follow up and additional medication.   Lelon Huh, MD  [facility]

## 2018-08-13 ENCOUNTER — Ambulatory Visit (INDEPENDENT_AMBULATORY_CARE_PROVIDER_SITE_OTHER): Payer: Medicare HMO | Admitting: Family Medicine

## 2018-08-13 DIAGNOSIS — F39 Unspecified mood [affective] disorder: Secondary | ICD-10-CM

## 2018-08-13 MED ORDER — FLUOXETINE HCL 20 MG PO CAPS: 20.0000 mg | ORAL_CAPSULE | Freq: Every day | ORAL | 1 refills | Status: DC

## 2018-08-19 ENCOUNTER — Telehealth: Payer: Self-pay | Admitting: Family Medicine

## 2018-08-19 NOTE — Telephone Encounter (Signed)
I called the patient to schedule his AWV w/ [facility].  He said that he will check with his son to see when he can bring him and call back. VDM (DD)

## 2018-08-26 ENCOUNTER — Other Ambulatory Visit: Payer: Self-pay | Admitting: Family Medicine

## 2018-08-27 ENCOUNTER — Other Ambulatory Visit: Payer: Self-pay | Admitting: Family Medicine

## 2018-09-13 ENCOUNTER — Ambulatory Visit: Payer: Self-pay | Admitting: Family Medicine

## 2018-09-17 ENCOUNTER — Ambulatory Visit: Payer: Self-pay | Admitting: Family Medicine

## 2018-10-12 ENCOUNTER — Encounter: Payer: Self-pay | Admitting: Family Medicine

## 2018-10-12 ENCOUNTER — Other Ambulatory Visit: Payer: Self-pay

## 2018-10-12 ENCOUNTER — Ambulatory Visit (INDEPENDENT_AMBULATORY_CARE_PROVIDER_SITE_OTHER): Payer: Medicare HMO | Admitting: Family Medicine

## 2018-10-12 VITALS — BP 164/88 | HR 58 | Temp 97.5°F | Ht 72.0 in | Wt 140.2 lb

## 2018-10-12 DIAGNOSIS — Z23 Encounter for immunization: Secondary | ICD-10-CM | POA: Diagnosis not present

## 2018-10-12 DIAGNOSIS — N183 Chronic kidney disease, stage 3 unspecified: Secondary | ICD-10-CM

## 2018-10-12 DIAGNOSIS — F39 Unspecified mood [affective] disorder: Secondary | ICD-10-CM | POA: Diagnosis not present

## 2018-10-12 DIAGNOSIS — I471 Supraventricular tachycardia: Secondary | ICD-10-CM | POA: Diagnosis not present

## 2018-10-12 DIAGNOSIS — M109 Gout, unspecified: Secondary | ICD-10-CM

## 2018-10-12 DIAGNOSIS — E039 Hypothyroidism, unspecified: Secondary | ICD-10-CM | POA: Diagnosis not present

## 2018-10-12 DIAGNOSIS — I10 Essential (primary) hypertension: Secondary | ICD-10-CM | POA: Diagnosis not present

## 2018-10-12 NOTE — Progress Notes (Signed)
Patient: Glenn Minasyan Sr. Male    DOB: 12-07-1927   84 y.o.   MRN: [id number] Visit Date: 10/12/2018  Today's Provider: Vernie Murders, PA   Chief Complaint  Patient presents with  . Follow-up    daughter is just wanting a routine check up   Subjective:    HPI  Pt's daughter reports pt is here for flu vaccine and a regular check up.  Pt reports that sometimes he has an episode of gout in left foot but takes medication everyday and not had any symptoms of gout lately.     Past Medical History:  Diagnosis Date  . Arthritis   . GERD (gastroesophageal reflux disease)   . Hemorrhoid 1986  . Hyperlipidemia   . Hypertension   . SVT (supraventricular tachycardia) ([place])   . Thyroid disease   . Vertigo    Past Surgical History:  Procedure Laterality Date  . APPENDECTOMY  1968  . BACK SURGERY    . COLONOSCOPY N/A 11/27/2017   Procedure: COLONOSCOPY;  Surgeon: Toledo, Benay Pike, MD;  Location: [facility] ENDOSCOPY;  Service: Gastroenterology;  Laterality: N/A;  . ESOPHAGOGASTRODUODENOSCOPY (EGD) WITH PROPOFOL N/A 11/25/2017   Procedure: ESOPHAGOGASTRODUODENOSCOPY (EGD) WITH PROPOFOL;  Surgeon: Toledo, Benay Pike, MD;  Location: [facility] ENDOSCOPY;  Service: Gastroenterology;  Laterality: N/A;  . EXCISIONAL HEMORRHOIDECTOMY    . INGUINAL HERNIA REPAIR Right 03/17/2016   Procedure: LAPAROSCOPIC INGUINAL HERNIA;  Surgeon: Florene Glen, MD;  Location: [facility] ORS;  Service: General;  Laterality: Right;  . SPINE SURGERY  1984   Family History  Problem Relation Age of Onset  . Alzheimer's disease Father   . Healthy Sister    Allergies  Allergen Reactions  . Codeine   . Cortizone-10  [Hydrocortisone]     Current Outpatient Medications:  .  allopurinol (ZYLOPRIM) 100 MG tablet, Take 0.5 tablets (50 mg total) by mouth daily., Disp: 30 tablet, Rfl: 3 .  amiodarone (PACERONE) 200 MG tablet, Take 1 tablet (200 mg total) by mouth daily., Disp: 90 tablet, Rfl: 1 .  amLODipine (NORVASC) 5  MG tablet, Take 1 tablet (5 mg total) by mouth daily., Disp: 90 tablet, Rfl: 1 .  colchicine 0.6 MG tablet, Take 0.5 tablets (0.3 mg total) by mouth daily., Disp: 15 tablet, Rfl: 0 .  ferrous sulfate (SLOW FE) 160 (50 Fe) MG TBCR SR tablet, Take 1 tablet (160 mg total) by mouth daily., Disp: 30 each, Rfl: 2 .  FLUoxetine (PROZAC) 20 MG capsule, Take 1 capsule (20 mg total) by mouth daily., Disp: 30 capsule, Rfl: 1 .  gabapentin (NEURONTIN) 100 MG capsule, Take 100 mg by mouth at bedtime., Disp: , Rfl:  .  levothyroxine (SYNTHROID, LEVOTHROID) 50 MCG tablet, TAKE 1 TABLET BY MOUTH ONCE DAILY FOR THYROID, ON AN EMPTY STOMACH. WAIT 30 MINUTES BEFORE TAKING OTHER MEDS., Disp: 90 tablet, Rfl: 3 .  Melatonin 5 MG TABS, Take 1 tablet (5 mg total) by mouth at bedtime., Disp: 30 tablet, Rfl: 0 .  pantoprazole (PROTONIX) 40 MG tablet, Take 1 tablet (40 mg total) by mouth daily., Disp: 90 tablet, Rfl: 3 .  traZODone (DESYREL) 50 MG tablet, Take 1 tablet (50 mg total) by mouth at bedtime., Disp: 90 tablet, Rfl: 1 .  triamcinolone (KENALOG) 0.025 % cream, TRIAMCINOLONE ACETONIDE, 0.025% (External Cream)  1 (one) Cream Cream apply daily as needed for 0 days  Quantity: 60;  Refills: 0   Ordered :13-Apr-2014  Drozdowski, [place] ;  Started  13-Apr-2014 Active Comments: Medication taken as needed. , Disp: , Rfl:   Review of Systems  Constitutional: Negative.   HENT: Negative.   Eyes: Negative.   Respiratory: Negative.   Cardiovascular: Negative.   Gastrointestinal: Negative.   Genitourinary: Negative.   Musculoskeletal: Negative.   Psychiatric/Behavioral: Negative.    Social History   Tobacco Use  . Smoking status: Former Smoker    Packs/day: 2.00    Years: 25.00    Pack years: 50.00    Last attempt to quit: 12/28/1965    Years since quitting: 52.8  . Smokeless tobacco: Never Used  Substance Use Topics  . Alcohol use: No    Frequency: Never    Comment: 1 beer occasionally   Objective:   BP (!)  164/88 (BP Location: Right Arm, Patient Position: Sitting, Cuff Size: Normal)   Pulse (!) 58   Temp (!) 97.5 F (36.4 C) (Oral)   Ht 6' (1.829 m)   Wt 140 lb 3.2 oz (63.6 kg)   SpO2 99%   BMI 19.01 kg/m   Wt Readings from Last 3 Encounters:  10/12/18 140 lb 3.2 oz (63.6 kg)  05/14/18 139 lb 4 oz (63.2 kg)  04/19/18 138 lb (62.6 kg)   Vitals:   10/12/18 1406  BP: (!) 164/88  Pulse: (!) 58  Temp: (!) 97.5 F (36.4 C)  TempSrc: Oral  SpO2: 99%  Weight: 140 lb 3.2 oz (63.6 kg)  Height: 6' (1.829 m)   Physical Exam  Constitutional: He is oriented to person, place, and time. He appears well-developed and well-nourished. No distress.  HENT:  Head: Normocephalic and atraumatic.  Right Ear: Hearing normal.  Left Ear: Hearing normal.  Nose: Nose normal.  Mouth/Throat: Oropharynx is clear and moist.  Edentulous - compensated.  Eyes: Conjunctivae and lids are normal. Right eye exhibits no discharge. Left eye exhibits no discharge. No scleral icterus.  Cardiovascular: Normal rate and regular rhythm.  Pulmonary/Chest: Effort normal and breath sounds normal. No respiratory distress.  Abdominal: Soft. Bowel sounds are normal.  Musculoskeletal: Normal range of motion.  Neurological: He is alert and oriented to person, place, and time.  Skin: Skin is intact. No lesion and no rash noted.  Psychiatric: He has a normal mood and affect. His speech is normal and behavior is normal. Thought content normal.      Assessment & Plan:     1. Benign hypertension Stable on the Amlodipine 5 mg qd. Recheck labs and follow up pending reports. - CBC with Differential/Platelet - Comprehensive metabolic panel  2. PSVT (paroxysmal supraventricular tachycardia) (HCC) No recent recurrence of tachycardia, chest pains, dyspnea or peripheral edema. Still taking Amiodarone 200 mg qd. Recheck labs and advised to follow up with Dr. Rockey Situ (cardiologist) as regularly as planned. - CBC with  Differential/Platelet - Comprehensive metabolic panel - TSH - T4  3. Hypothyroidism, unspecified type No significant weight change. No myxedema, exophthalmos, tremor or palpitations. Will recheck labs and continue Levothyroxine 50 mcg qd. - CBC with Differential/Platelet - Comprehensive metabolic panel - TSH - T4  4. Gout of multiple sites, unspecified cause, unspecified chronicity Stable on the Allopurinol 100 mg 1/2 tablet daily. Has not had a flare in the past 6 months. Not using colchicine since April 2019. Recheck CBC and Uric Acid level. Continues to drink "3 bottles of water a day". - CBC with Differential/Platelet - Uric acid  5. Chronic kidney disease (CKD), stage III (moderate) (HCC) Followed by Dr. Candiss Norse (nephrologist). Recheck labs to assess  anemia of chronic disease. Appetite diminished but weight stable. May need B-complex supplement and encouraged to eat a little more at mealtimes. - CBC with Differential/Platelet - Comprehensive metabolic panel  6. Needs flu shot - Flu vaccine HIGH DOSE PF (Fluzone High dose)  7. Mood disorder ([place]) Stable and in good spirits today. Presently on the Prozac 20 mg qd, Trazodone 50 mg hs with Melatonin 5 mg hs for insomnia. Goes to bed at 9:00 pm and gets up at 4:00 am each day. Recheck pending lab reports.      Vernie Murders, PA  [facility]

## 2018-10-13 LAB — CBC WITH DIFFERENTIAL/PLATELET
BASOS: 1 %
Basophils Absolute: 0 10*3/uL (ref 0.0–0.2)
EOS (ABSOLUTE): 0.1 10*3/uL (ref 0.0–0.4)
Eos: 1 %
HEMOGLOBIN: 9.2 g/dL — AB (ref 13.0–17.7)
Hematocrit: 28.8 % — ABNORMAL LOW (ref 37.5–51.0)
IMMATURE GRANS (ABS): 0 10*3/uL (ref 0.0–0.1)
Immature Granulocytes: 0 %
LYMPHS: 33 %
Lymphocytes Absolute: 2 10*3/uL (ref 0.7–3.1)
MCH: 27.9 pg (ref 26.6–33.0)
MCHC: 31.9 g/dL (ref 31.5–35.7)
MCV: 87 fL (ref 79–97)
Monocytes Absolute: 0.4 10*3/uL (ref 0.1–0.9)
Monocytes: 7 %
NEUTROS ABS: 3.6 10*3/uL (ref 1.4–7.0)
Neutrophils: 58 %
Platelets: 119 10*3/uL — ABNORMAL LOW (ref 150–450)
RBC: 3.3 x10E6/uL — ABNORMAL LOW (ref 4.14–5.80)
RDW: 15 % (ref 12.3–15.4)
WBC: 6.1 10*3/uL (ref 3.4–10.8)

## 2018-10-13 LAB — COMPREHENSIVE METABOLIC PANEL
A/G RATIO: 1.1 — AB (ref 1.2–2.2)
ALBUMIN: 3.6 g/dL (ref 3.5–4.7)
ALT: 15 IU/L (ref 0–44)
AST: 18 IU/L (ref 0–40)
Alkaline Phosphatase: 126 IU/L — ABNORMAL HIGH (ref 39–117)
BILIRUBIN TOTAL: 0.2 mg/dL (ref 0.0–1.2)
BUN/Creatinine Ratio: 10 (ref 10–24)
BUN: 22 mg/dL (ref 8–27)
CHLORIDE: 103 mmol/L (ref 96–106)
CO2: 23 mmol/L (ref 20–29)
Calcium: 8.6 mg/dL (ref 8.6–10.2)
Creatinine, Ser: 2.14 mg/dL — ABNORMAL HIGH (ref 0.76–1.27)
GFR calc non Af Amer: 26 mL/min/{1.73_m2} — ABNORMAL LOW (ref >59)
GFR, EST AFRICAN AMERICAN: 31 mL/min/{1.73_m2} — AB (ref >59)
GLOBULIN, TOTAL: 3.4 g/dL (ref 1.5–4.5)
Glucose: 86 mg/dL (ref 65–99)
POTASSIUM: 4.1 mmol/L (ref 3.5–5.2)
Sodium: 140 mmol/L (ref 134–144)
TOTAL PROTEIN: 7 g/dL (ref 6.0–8.5)

## 2018-10-13 LAB — TSH: TSH: 13.83 u[IU]/mL — ABNORMAL HIGH (ref 0.450–4.500)

## 2018-10-13 LAB — URIC ACID: Uric Acid: 6.3 mg/dL (ref 3.7–8.6)

## 2018-10-13 LAB — T4: T4, Total: 9.3 ug/dL (ref 4.5–12.0)

## 2018-10-27 ENCOUNTER — Other Ambulatory Visit: Payer: Self-pay | Admitting: Family Medicine

## 2018-10-27 DIAGNOSIS — E039 Hypothyroidism, unspecified: Secondary | ICD-10-CM

## 2018-12-30 NOTE — Progress Notes (Signed)
Patient: Glenn Bingley Sr. Male    DOB: [birth year]   85 y.o.   MRN: [id number] Visit Date: 12/31/2018  Today's Provider: Vernie Murders, PA   Chief Complaint  Patient presents with  . Follow-up  . Hypertension  . Hypothyroidism  . Chronic Kidney Disease  . Gout   Subjective:     HPI    Hypertension, follow-up:  BP Readings from Last 3 Encounters:  12/31/18 (!) 150/74  10/12/18 (!) 164/88  05/14/18 128/64    He was last seen for hypertension 3 months ago.  BP at that visit was 164/88. Management since that visit includes; labs checked, no changes.He reports good compliance with treatment. He is not having side effects. none He is not exercising. He is adherent to low salt diet.   Outside blood pressures are not checking. He is experiencing none.  Patient denies none.   Cardiovascular risk factors include advanced age (older than 47 for men, 35 for women).  Use of agents associated with hypertension: none.   ---------------------------------------------------------------   PSVT (paroxysmal supraventricular tachycardia) ([place]) From 10/12/2018-labs checked. Advised to follow up with Dr. Rockey Situ (cardiologist) as regularly as planned.  Hypothyroidism, unspecified type From 10/12/2018-labs checked, no changes.   Gout of multiple sites, unspecified cause, unspecified chronicity From 10/12/2018-labs checked, no changes.   Chronic kidney disease (CKD), stage III (moderate) (HCC) From 10/12/2018-labs checked. Advised to continue follow up with nephrologist (Dr. Candiss Norse) and take Multivitamin with Iron daily. Recheck blood counts in 3 months.  Mood disorder ([place]) From 10/12/2018-Stable. Presently on the Prozac 20 mg qd, Trazodone 50 mg hs with Melatonin 5 mg hs for insomnia. Recheck in 3 months.  Past Medical History:  Diagnosis Date  . Arthritis   . GERD (gastroesophageal reflux disease)   . Hemorrhoid 1986  . Hyperlipidemia   . Hypertension   . SVT  (supraventricular tachycardia) ([place])   . Thyroid disease   . Vertigo    Past Surgical History:  Procedure Laterality Date  . APPENDECTOMY  1968  . BACK SURGERY    . COLONOSCOPY N/A 11/27/2017   Procedure: COLONOSCOPY;  Surgeon: Toledo, Benay Pike, MD;  Location: [facility] ENDOSCOPY;  Service: Gastroenterology;  Laterality: N/A;  . ESOPHAGOGASTRODUODENOSCOPY (EGD) WITH PROPOFOL N/A 11/25/2017   Procedure: ESOPHAGOGASTRODUODENOSCOPY (EGD) WITH PROPOFOL;  Surgeon: Toledo, Benay Pike, MD;  Location: [facility] END[facility]COPY;  Service: Gastroenterology;  Laterality: N/A;  . EXCISIONAL HEMORRHOIDECTOMY    . INGUINAL HERNIA REPAIR Right 03/17/2016   Procedure: LAPAROSCOPIC INGUINAL HERNIA;  Surgeon: Florene Glen, MD;  Location: [facility] ORS;  Service: General;  Laterality: Right;  . SPINE SURGERY  1984   Family History  Problem Relation Age of Onset  . Alzheimer's disease Father   . Healthy Sister    Allergies  Allergen Reactions  . Codeine   . Cortizone-10  [Hydrocortisone]     Current Outpatient Medications:  .  allopurinol (ZYLOPRIM) 100 MG tablet, Take 0.5 tablets (50 mg total) by mouth daily., Disp: 30 tablet, Rfl: 3 .  amiodarone (PACERONE) 200 MG tablet, TAKE 1 TABLET BY MOUTH DAILY, Disp: 90 tablet, Rfl: 1 .  amLODipine (NORVASC) 5 MG tablet, TAKE 1 TABLET BY MOUTH DAILY, Disp: 90 tablet, Rfl: 1 .  levothyroxine (SYNTHROID, LEVOTHROID) 50 MCG tablet, TAKE 1 TABLET BY MOUTH ONCE DAILY ON AN EMPTY STOMACH. WAIT 30 MINUTES BEFORE TAKING OTHER MEDS., Disp: 90 tablet, Rfl: 3 .  Melatonin 5 MG TABS, Take 1 tablet (5 mg total) by  mouth at bedtime., Disp: 30 tablet, Rfl: 0 .  pantoprazole (PROTONIX) 40 MG tablet, Take 1 tablet (40 mg total) by mouth daily., Disp: 90 tablet, Rfl: 3 .  traZODone (DESYREL) 50 MG tablet, TAKE 1 TABLET BY MOUTH DAILY AT BEDTIME, Disp: 90 tablet, Rfl: 1 .  colchicine 0.6 MG tablet, Take 0.5 tablets (0.3 mg total) by mouth daily., Disp: 15 tablet, Rfl: 0 .  triamcinolone  (KENALOG) 0.025 % cream, TRIAMCINOLONE ACETONIDE, 0.025% (External Cream)  1 (one) Cream Cream apply daily as needed for 0 days  Quantity: 60;  Refills: 0   Ordered :13-Apr-2014  [name] ;  Started 13-Apr-2014 Active Comments: Medication taken as needed. , Disp: , Rfl:   Review of Systems  Constitutional: Negative for appetite change, chills and fever.  Respiratory: Negative for chest tightness, shortness of breath and wheezing.   Cardiovascular: Negative for chest pain and palpitations.  Gastrointestinal: Negative for abdominal pain, nausea and vomiting.   Social History   Tobacco Use  . Smoking status: Former Smoker    Packs/day: 2.00    Years: 25.00    Pack years: 50.00    Last attempt to quit: 12/28/1965    Years since quitting: 53.0  . Smokeless tobacco: Never Used  Substance Use Topics  . Alcohol use: No    Frequency: Never    Comment: 1 beer occasionally     Objective:   BP (!) 150/74 (BP Location: Right Arm, Patient Position: Sitting, Cuff Size: Normal)   Pulse 64   Temp 97.6 F (36.4 C) (Oral)   Resp 16   Wt 130 lb (59 kg)   SpO2 96%   BMI 17.63 kg/m    Wt Readings from Last 3 Encounters:  12/31/18 130 lb (59 kg)  10/12/18 140 lb 3.2 oz (63.6 kg)  05/14/18 139 lb 4 oz (63.2 kg)   Vitals:   12/31/18 0817  BP: (!) 150/74  Pulse: 64  Resp: 16  Temp: 97.6 F (36.4 C)  TempSrc: Oral  SpO2: 96%  Weight: 130 lb (59 kg)   Physical Exam Constitutional:      General: He is not in acute distress.    Appearance: He is well-developed.  HENT:     Head: Normocephalic and atraumatic.     Right Ear: Hearing and tympanic membrane normal.     Left Ear: Hearing and tympanic membrane normal.     Nose: Nose normal.     Mouth/Throat:     Pharynx: Oropharynx is clear.     Comments: Edentulous - compensated. Eyes:     General: Lids are normal. No scleral icterus.       Right eye: No discharge.        Left eye: No discharge.     Conjunctiva/sclera:  Conjunctivae normal.  Neck:     Musculoskeletal: Normal range of motion and neck supple.  Cardiovascular:     Rate and Rhythm: Normal rate and regular rhythm.     Pulses: Normal pulses.     Heart sounds: Normal heart sounds.  Pulmonary:     Effort: Pulmonary effort is normal. No respiratory distress.     Breath sounds: Normal breath sounds.  Abdominal:     General: Bowel sounds are normal.     Palpations: Abdomen is soft.  Musculoskeletal: Normal range of motion.  Skin:    Findings: No lesion or rash.  Neurological:     Mental Status: He is alert and oriented to person, place, and time.  Psychiatric:        Speech: Speech normal.        Behavior: Behavior normal.        Thought Content: Thought content normal.       Assessment & Plan    1. Benign hypertension Stable and controlled BP on the Amlodipine 5 mg qd. Recheck routine labs and follow up pending reports.  - CBC with Differential/Platelet - Comprehensive metabolic panel - TSH - T4  2. PSVT (paroxysmal supraventricular tachycardia) (HCC) Followed by Dr. Fletcher Anon (cardiologist). No recent palpitations, chest pains or edema. Tolerating Amlodipine 5 mg qd and Amiodarone 200 mg qd. No syncopal episodes or dizziness. Recheck CBC, CMP and thyroid function. Continue follow up with cardiologist. - CBC with Differential/Platelet - Comprehensive metabolic panel - TSH - T4  3. Gastroesophageal reflux disease, esophagitis presence not specified Still taking the Pantroprozole 40 mg qd and no longer having heartburn. No melena, hematemesis or hematochezia. Recheck CBC and follow up pending lab reports. - CBC with Differential/Platelet  4. Adult hypothyroidism Has lost 10 lbs in the past 3 months. Still taking the Levothyroxine 50 mcg qd. Will check labs. Having some constipation and no help with stool softeners. May use Miralax prn. May need to decrease Levothyroxine dosage and consider vitamin supplements. - CBC with  Differential/Platelet - Comprehensive metabolic panel - TSH - T4  5. Chronic kidney disease (CKD), stage III (moderate) (HCC) Feeling well with no dysuria or frequency. Has been restricting salt intake and has not been rechecked by Dr. Candiss Norse (nephrologist) in the past year (per patient). Will recheck labs. - CBC with Differential/Platelet - Comprehensive metabolic panel - TSH  6. Gout of multiple sites, unspecified cause, unspecified chronicity No recent flare. Continues Allopurinol 50 mg qd without side effects. - CBC with Differential/Platelet - Comprehensive metabolic panel - Uric acid     Vernie Murders, PA  [facility]

## 2018-12-31 ENCOUNTER — Ambulatory Visit (INDEPENDENT_AMBULATORY_CARE_PROVIDER_SITE_OTHER): Payer: Medicare HMO | Admitting: Family Medicine

## 2018-12-31 ENCOUNTER — Encounter: Payer: Self-pay | Admitting: Family Medicine

## 2018-12-31 VITALS — BP 150/74 | HR 64 | Temp 97.6°F | Resp 16 | Wt 130.0 lb

## 2018-12-31 DIAGNOSIS — E039 Hypothyroidism, unspecified: Secondary | ICD-10-CM

## 2018-12-31 DIAGNOSIS — M109 Gout, unspecified: Secondary | ICD-10-CM | POA: Diagnosis not present

## 2018-12-31 DIAGNOSIS — K219 Gastro-esophageal reflux disease without esophagitis: Secondary | ICD-10-CM | POA: Diagnosis not present

## 2018-12-31 DIAGNOSIS — N183 Chronic kidney disease, stage 3 unspecified: Secondary | ICD-10-CM

## 2018-12-31 DIAGNOSIS — I10 Essential (primary) hypertension: Secondary | ICD-10-CM | POA: Diagnosis not present

## 2018-12-31 DIAGNOSIS — I471 Supraventricular tachycardia: Secondary | ICD-10-CM

## 2019-01-01 LAB — COMPREHENSIVE METABOLIC PANEL
A/G RATIO: 1.3 (ref 1.2–2.2)
ALBUMIN: 4 g/dL (ref 3.2–4.6)
ALK PHOS: 124 IU/L — AB (ref 39–117)
ALT: 17 IU/L (ref 0–44)
AST: 19 IU/L (ref 0–40)
BUN/Creatinine Ratio: 10 (ref 10–24)
BUN: 24 mg/dL (ref 10–36)
Bilirubin Total: 0.4 mg/dL (ref 0.0–1.2)
CALCIUM: 9.3 mg/dL (ref 8.6–10.2)
CO2: 22 mmol/L (ref 20–29)
Chloride: 103 mmol/L (ref 96–106)
Creatinine, Ser: 2.51 mg/dL — ABNORMAL HIGH (ref 0.76–1.27)
GFR calc Af Amer: 25 mL/min/{1.73_m2} — ABNORMAL LOW (ref >59)
GFR calc non Af Amer: 22 mL/min/{1.73_m2} — ABNORMAL LOW (ref >59)
GLOBULIN, TOTAL: 3.2 g/dL (ref 1.5–4.5)
Glucose: 87 mg/dL (ref 65–99)
Potassium: 4.1 mmol/L (ref 3.5–5.2)
Sodium: 140 mmol/L (ref 134–144)
TOTAL PROTEIN: 7.2 g/dL (ref 6.0–8.5)

## 2019-01-01 LAB — CBC WITH DIFFERENTIAL/PLATELET
BASOS: 1 %
Basophils Absolute: 0.1 10*3/uL (ref 0.0–0.2)
EOS (ABSOLUTE): 0.1 10*3/uL (ref 0.0–0.4)
Eos: 2 %
HEMOGLOBIN: 10.8 g/dL — AB (ref 13.0–17.7)
Hematocrit: 32.8 % — ABNORMAL LOW (ref 37.5–51.0)
IMMATURE GRANULOCYTES: 0 %
Immature Grans (Abs): 0 10*3/uL (ref 0.0–0.1)
LYMPHS ABS: 1.7 10*3/uL (ref 0.7–3.1)
LYMPHS: 29 %
MCH: 29.3 pg (ref 26.6–33.0)
MCHC: 32.9 g/dL (ref 31.5–35.7)
MCV: 89 fL (ref 79–97)
MONOCYTES: 9 %
Monocytes Absolute: 0.5 10*3/uL (ref 0.1–0.9)
Neutrophils Absolute: 3.4 10*3/uL (ref 1.4–7.0)
Neutrophils: 59 %
Platelets: 117 10*3/uL — ABNORMAL LOW (ref 150–450)
RBC: 3.68 x10E6/uL — ABNORMAL LOW (ref 4.14–5.80)
RDW: 14 % (ref 12.3–15.4)
WBC: 5.7 10*3/uL (ref 3.4–10.8)

## 2019-01-01 LAB — URIC ACID: URIC ACID: 6.2 mg/dL (ref 3.7–8.6)

## 2019-01-01 LAB — TSH: TSH: 19.22 u[IU]/mL — AB (ref 0.450–4.500)

## 2019-01-01 LAB — T4: T4 TOTAL: 9.3 ug/dL (ref 4.5–12.0)

## 2019-01-03 ENCOUNTER — Telehealth: Payer: Self-pay

## 2019-01-03 NOTE — Telephone Encounter (Signed)
Pt returned missed call - Please call between 12:00 and after.  Thanks, [employer]

## 2019-01-03 NOTE — Telephone Encounter (Signed)
Mrs. Glenn Jarvis requesting a prescription for Ensure and a prescription for vitamins with iron. Patient reports that in the past she has had prescriptions for these. Please advise.  CB# [phone] [facility]

## 2019-01-03 NOTE — Telephone Encounter (Signed)
Called Y[facility] Cell number no answer and called home number patient advised as directed below but requested to wait on his daughter call to explain to her.

## 2019-01-03 NOTE — Telephone Encounter (Signed)
-----   Message from Margo Common, [place] sent at 01/03/2019  8:28 AM EST ----- Hgb improved but kidney function worse. Normal uric acid level. Thyroid tests show normal T4 level but elevated TSH - continue Levothyroxine 50 mcg qd. With kidney function worsening, should recheck with Dr. Candiss Norse (nephrologist). May need endocrinology evaluation/referral if thyroid tests no better with taking medication and a multivitamin with iron daily. Recheck levels in 2 months and increase water intake. May add Ensure mid morning and mid afternoon as a snack.

## 2019-01-03 NOTE — Telephone Encounter (Signed)
LMTCB

## 2019-01-04 ENCOUNTER — Telehealth: Payer: Self-pay | Admitting: Family Medicine

## 2019-01-04 ENCOUNTER — Other Ambulatory Visit: Payer: Self-pay | Admitting: Family Medicine

## 2019-01-04 DIAGNOSIS — R5383 Other fatigue: Secondary | ICD-10-CM

## 2019-01-04 DIAGNOSIS — D508 Other iron deficiency anemias: Secondary | ICD-10-CM

## 2019-01-04 MED ORDER — TAB-A-VITE/IRON PO TABS: 1.0000 | ORAL_TABLET | Freq: Every day | ORAL | 1 refills | Status: DC

## 2019-01-04 MED ORDER — ENSURE COMPLETE PO LIQD: 237.0000 mL | Freq: Two times a day (BID) | ORAL | 0 refills | Status: DC

## 2019-01-04 NOTE — Telephone Encounter (Signed)
°  Pt is asking if Glenn Jarvis could call in Rx's for pt's: Iron Ensure Drink  Pt's insurance will pay for the Iron and Ensure Drink.  Thanks, [employer]

## 2019-01-04 NOTE — Telephone Encounter (Signed)
Can try to get the Ensure twice a day for weight loss and Multivitamin with Iron once a day for low hemoglobin. This being a new year, insurances may, or may not, cover these because they can be gotten over-the-counter without prescription.

## 2019-01-04 NOTE — Telephone Encounter (Signed)
Done

## 2019-01-04 NOTE — Telephone Encounter (Signed)
Patient reports that she knows that she can buy this OTC but she prefers a prescription because she is sure that it will be cover.

## 2019-01-05 NOTE — Telephone Encounter (Signed)
This was done yesterday when we spoke with his daughter. Prescription was written for 60 bottle. R:0 Take 237 mls by mouth 2 (two) times daily between meals.

## 2019-03-12 ENCOUNTER — Other Ambulatory Visit: Payer: Self-pay | Admitting: Family Medicine

## 2019-03-13 ENCOUNTER — Other Ambulatory Visit: Payer: Self-pay | Admitting: Family Medicine

## 2019-03-13 DIAGNOSIS — R5383 Other fatigue: Secondary | ICD-10-CM

## 2019-04-27 ENCOUNTER — Other Ambulatory Visit: Payer: Self-pay | Admitting: Family Medicine

## 2019-06-23 ENCOUNTER — Encounter: Payer: Self-pay | Admitting: Family Medicine

## 2019-07-20 ENCOUNTER — Other Ambulatory Visit: Payer: Self-pay | Admitting: Family Medicine

## 2019-09-23 ENCOUNTER — Ambulatory Visit: Payer: Self-pay | Admitting: Family Medicine

## 2020-01-05 ENCOUNTER — Other Ambulatory Visit: Payer: Self-pay | Admitting: Family Medicine

## 2020-01-05 DIAGNOSIS — E039 Hypothyroidism, unspecified: Secondary | ICD-10-CM

## 2020-01-05 MED ORDER — PANTOPRAZOLE SODIUM 40 MG PO TBEC: 40.0000 mg | DELAYED_RELEASE_TABLET | Freq: Every day | ORAL | 3 refills | Status: DC

## 2020-01-05 NOTE — Telephone Encounter (Signed)
Pt's daughter [name] is taking care of pt. She states pt is out of his acid reflux medication and needing a refill.   She doesn't want to buy over the counter medication.  Please call into: [facility], [place]. Phone:  [phone]  Fax:  [phone]     [name] is asking for a call back at [phone] to let her know it was refilled.  Thanks, [employer]

## 2020-01-05 NOTE — Telephone Encounter (Signed)
RX sent to [facility]. Patient's daughter [name] advised.

## 2020-01-05 NOTE — Telephone Encounter (Signed)
Requested medication (s) are due for refill today: YES  Requested medication (s) are on the active medication list: YES  Last refill:  07/21/2019  Future visit scheduled: YES  Notes to clinic:  no valid encounter within last 6 months   Requested Prescriptions  Pending Prescriptions Disp Refills   traZODone (DESYREL) 50 MG tablet [Pharmacy Med Name: TRAZODONE HCL 50 MG TAB] 90 tablet 1    Sig: TAKE 1 TABLET BY MOUTH AT BEDTIME      Psychiatry: Antidepressants - Serotonin Modulator Failed - 01/05/2020 12:21 PM      Failed - Valid encounter within last 6 months    Recent Outpatient Visits           1 year ago Benign hypertension   [facility], Vickki Muff, [place]   1 year ago Benign hypertension   [facility], Vickki Muff, [place]   1 year ago Mood disorder [facility])   [place], Kirstie Peri, MD   1 year ago PSVT (paroxysmal supraventricular tachycardia) [facility])   [place], Vickki Muff, [place]   1 year ago Left wrist pain   [place], Vickki Muff, [place]       Future Appointments             In 4 weeks [place], Vickki Muff, [place], [facility]              amiodarone (PACERONE) 200 MG tablet [employer] [facility] Med Name: AMIODARONE HCL 200 MG TAB] 90 tablet 1    Sig: TAKE 1 TABLET BY MOUTH ONCE DAILY      Not Delegated - Cardiovascular: Antiarrhythmic Agents - amiodarone Failed - 01/05/2020 12:21 PM      Failed - This refill cannot be delegated      Failed - TSH in normal range and within 180 days    TSH  Date Value Ref Range Status  12/31/2018 19.220 (H) 0.450 - 4.500 uIU/mL Final          Failed - Mg Level in normal range and within 180 days    No results found for: MG        Failed - K in normal range and within 180 days    Potassium  Date Value Ref Range Status  12/31/2018 4.1 3.5 - 5.2 mmol/L Final  05/10/2014 4.6 3.5 - 5.1 mmol/L Final          Failed - Ca in  normal range and within 180 days    Calcium  Date Value Ref Range Status  12/31/2018 9.3 8.6 - 10.2 mg/dL Final   Calcium, Total  Date Value Ref Range Status  05/10/2014 8.7 8.5 - 10.1 mg/dL Final          Failed - AST in normal range and within 180 days    AST  Date Value Ref Range Status  12/31/2018 19 0 - 40 IU/L Final          Failed - ALT in normal range and within 180 days    ALT  Date Value Ref Range Status  12/31/2018 17 0 - 44 IU/L Final          Failed - Patient had ECG in the last 180 days.      Failed - Last BP in normal range    BP Readings from Last 1 Encounters:  12/31/18 (!) 150/74          Failed - Valid encounter  within last 6 months    Recent Outpatient Visits           1 year ago Benign hypertension   [place], Vickki Muff, [place]   1 year ago Benign hypertension   [facility], Vickki Muff, [place]   1 year ago Mood disorder [facility])   [facility] Birdie Sons, [place]   1 year ago PSVT (paroxysmal supraventricular tachycardia) [facility])   [place], Vickki Muff, [place]   1 year ago Left wrist pain   [facility], Vickki Muff, [place]       Future Appointments             In 4 weeks [place], Vickki Muff, [place], [facility]            Passed - Last Heart Rate in normal range    Pulse Readings from Last 1 Encounters:  12/31/18 64            levothyroxine (SYNTHROID) 50 MCG tablet [Pharmacy Med Name: LEVOTHYROXINE SODIUM 50 MCG TAB] 90 tablet 3    Sig: TAKE 1 TABLET BY MOUTH ONCE DAILY ON AN EMPTY STOMACH. WAIT 30 MINUTES BEFORE TAKING OTHER MEDS.      Endocrinology:  Hypothyroid Agents Failed - 01/05/2020 12:21 PM      Failed - TSH needs to be rechecked within 3 months after an abnormal result. Refill until TSH is due.      Failed - TSH in normal range and within 360 days    TSH  Date Value Ref Range Status  12/31/2018 19.220 (H) 0.450  - 4.500 uIU/mL Final          Failed - Valid encounter within last 12 months    Recent Outpatient Visits           1 year ago Benign hypertension   [place], Vickki Muff, [place]   1 year ago Benign hypertension   [facility], Vickki Muff, [place]   1 year ago Mood disorder [facility])   [facility] Birdie Sons, MD   1 year ago PSVT (paroxysmal supraventricular tachycardia) [facility])   [place], Vickki Muff, [place]   1 year ago Left wrist pain   [place], Vickki Muff, [place]       Future Appointments             In 4 weeks [place], Vickki Muff, [place], [place]

## 2020-01-06 ENCOUNTER — Other Ambulatory Visit: Payer: Self-pay | Admitting: Family Medicine

## 2020-01-06 NOTE — Telephone Encounter (Signed)
Refill request of amlodipine 5 mg tab Per protocol, no office visit within the last 6 months. NOV  Scheduled for 02/02/20. Pt of [employer], [place]

## 2020-01-23 ENCOUNTER — Ambulatory Visit: Payer: Medicare HMO | Admitting: Family Medicine

## 2020-02-02 ENCOUNTER — Ambulatory Visit: Payer: Medicare PPO | Admitting: Family Medicine

## 2020-02-02 ENCOUNTER — Encounter: Payer: Self-pay | Admitting: Family Medicine

## 2020-02-02 ENCOUNTER — Other Ambulatory Visit: Payer: Self-pay

## 2020-02-02 VITALS — BP 182/82 | HR 64 | Temp 97.1°F | Wt 142.0 lb

## 2020-02-02 DIAGNOSIS — G4700 Insomnia, unspecified: Secondary | ICD-10-CM

## 2020-02-02 DIAGNOSIS — E782 Mixed hyperlipidemia: Secondary | ICD-10-CM | POA: Diagnosis not present

## 2020-02-02 DIAGNOSIS — E039 Hypothyroidism, unspecified: Secondary | ICD-10-CM

## 2020-02-02 DIAGNOSIS — K219 Gastro-esophageal reflux disease without esophagitis: Secondary | ICD-10-CM

## 2020-02-02 DIAGNOSIS — I471 Supraventricular tachycardia: Secondary | ICD-10-CM

## 2020-02-02 DIAGNOSIS — I10 Essential (primary) hypertension: Secondary | ICD-10-CM

## 2020-02-02 DIAGNOSIS — R609 Edema, unspecified: Secondary | ICD-10-CM

## 2020-02-02 DIAGNOSIS — Z23 Encounter for immunization: Secondary | ICD-10-CM | POA: Diagnosis not present

## 2020-02-02 DIAGNOSIS — M109 Gout, unspecified: Secondary | ICD-10-CM

## 2020-02-02 NOTE — Progress Notes (Signed)
Patient: Glenn Muse Sr. Male    DOB: 02-06-1927   86 y.o.   MRN: [id number] Visit Date: 02/02/2020  Today's Provider: Vernie Murders, PA   Chief Complaint  Patient presents with  . Hypertension  . Hyperlipidemia  . Hypothyroidism  . Anemia   Subjective:     HPI  The patient  Is a 86 year old male who presents for follow up of chronic health issues.  He was last seen on 12/31/2018.   In addition to his chronic issues, the patient's daughter reports that he is having a flare of his gout.  He is not currently on Colchicine but is taking his Allopurinol.   She has also asked for him to have his flu shot today, a prescription for his support hose and something for indigestion.  At one point the patient was on Pantoprazole but it is not in with his blister pack of medications.    1. Benign hypertension  At the time of his last visit his pressure was stable and no changes were made in his treatment.  Renal panel was checked at that time and results are included.  He reports good tolerance of his medications.  BP Readings from Last 3 Encounters:  02/02/20 (!) 188/86  12/31/18 (!) 150/74  10/12/18 (!) 164/88   Wt Readings from Last 3 Encounters:  02/02/20 142 lb (64.4 kg)  12/31/18 130 lb (59 kg)  10/12/18 140 lb 3.2 oz (63.6 kg)   Lab Results  Component Value Date   CREATININE 2.51 (H) 12/31/2018   BUN 24 12/31/2018   NA 140 12/31/2018   K 4.1 12/31/2018   CL 103 12/31/2018   CO2 22 12/31/2018   2. Hypothyroidism, unspecified type Patient is also due for his thyroid level to be checked.   Lab Results  Component Value Date   TSH 19.220 (H) 12/31/2018   T3TOTAL 81 05/26/2017   T4TOTAL 9.3 12/31/2018   3. Mixed hyperlipidemia  Last cholesterol check was in Jan 2020.  Patient is not currently on any medication for this issue.    Lab Results  Component Value Date   CHOL 160 04/02/2017   HDL 50 04/02/2017   LDLCALC 98 04/02/2017   TRIG 58 04/02/2017   CHOLHDL 3.2 04/02/2017   Past Medical History:  Diagnosis Date  . Arthritis   . GERD (gastroesophageal reflux disease)   . Hemorrhoid 1986  . Hyperlipidemia   . Hypertension   . SVT (supraventricular tachycardia) ([place])   . Thyroid disease   . Vertigo    Family History  Problem Relation Age of Onset  . Alzheimer's disease Father   . Healthy Sister     Allergies  Allergen Reactions  . Codeine   . Cortizone-10  [Hydrocortisone]      Current Outpatient Medications:  .  allopurinol (ZYLOPRIM) 100 MG tablet, TAKE 1/2 TABLET BY MOUTH ONCE DAILY, Disp: 30 tablet, Rfl: 3 .  amiodarone (PACERONE) 200 MG tablet, TAKE 1 TABLET BY MOUTH ONCE DAILY, Disp: 90 tablet, Rfl: 1 .  amLODipine (NORVASC) 5 MG tablet, TAKE 1 TABLET BY MOUTH ONCE DAILY, Disp: 90 tablet, Rfl: 0 .  levothyroxine (SYNTHROID) 50 MCG tablet, TAKE 1 TABLET BY MOUTH ONCE DAILY ON AN EMPTY STOMACH. WAIT 30 MINUTES BEFORE TAKING OTHER MEDS., Disp: 90 tablet, Rfl: 3 .  Melatonin 5 MG TABS, Take 1 tablet (5 mg total) by mouth at bedtime., Disp: 30 tablet, Rfl: 0 .  traZODone (DESYREL)  50 MG tablet, TAKE 1 TABLET BY MOUTH AT BEDTIME, Disp: 90 tablet, Rfl: 0 .  colchicine 0.6 MG tablet, Take 0.5 tablets (0.3 mg total) by mouth daily. (Patient not taking: Reported on 02/02/2020), Disp: 15 tablet, Rfl: 0 .  Ensure (ENSURE), Take 237 mLs by mouth 2 (two) times daily between meals. (Patient not taking: Reported on 02/02/2020), Disp: 14220 mL, Rfl: 3 .  Multiple Vitamins-Iron (MULTIVITAMINS WITH IRON) TABS tablet, Take 1 tablet by mouth daily. (Patient not taking: Reported on 02/02/2020), Disp: 90 tablet, Rfl: 1 .  pantoprazole (PROTONIX) 40 MG tablet, Take 1 tablet (40 mg total) by mouth daily. (Patient not taking: Reported on 02/02/2020), Disp: 90 tablet, Rfl: 3 .  triamcinolone (KENALOG) 0.025 % cream, TRIAMCINOLONE ACETONIDE, 0.025% (External Cream)  1 (one) Cream Cream apply daily as needed for 0 days  Quantity: 60;  Refills: 0   Ordered  :13-Apr-2014  [name] ;  Started 13-Apr-2014 Active Comments: Medication taken as needed. , Disp: , Rfl:   Review of Systems  Constitutional: Negative for chills, fatigue and fever.  HENT: Negative for congestion, sinus pain and sore throat.   Respiratory: Negative for cough, shortness of breath and wheezing.   Cardiovascular: Positive for leg swelling. Negative for chest pain.  Musculoskeletal: Positive for arthralgias, joint swelling and myalgias.  Neurological: Negative for headaches.    Social History   Tobacco Use  . Smoking status: Former Smoker    Packs/day: 2.00    Years: 25.00    Pack years: 50.00    Quit date: 12/28/1965    Years since quitting: 54.1  . Smokeless tobacco: Never Used  Substance Use Topics  . Alcohol use: No    Comment: 1 beer occasionally      Objective:   BP (!) 188/86 (BP Location: Right Arm, Patient Position: Sitting, Cuff Size: Normal)   Pulse 64   Temp (!) 97.1 F (36.2 C) (Skin)   Wt 142 lb (64.4 kg)   SpO2 99%   BMI 19.26 kg/m  Vitals:   02/02/20 1016  BP: (!) 188/86  Pulse: 64  Temp: (!) 97.1 F (36.2 C)  TempSrc: Skin  SpO2: 99%  Weight: 142 lb (64.4 kg)  Body mass index is 19.26 kg/m.  Physical Exam Constitutional:      General: He is not in acute distress.    Appearance: He is well-developed.  HENT:     Head: Normocephalic and atraumatic.     Right Ear: Hearing normal.     Left Ear: Hearing normal.     Nose: Nose normal.  Eyes:     General: Lids are normal. No scleral icterus.       Right eye: No discharge.        Left eye: No discharge.     Conjunctiva/sclera: Conjunctivae normal.  Cardiovascular:     Rate and Rhythm: Normal rate and regular rhythm.     Pulses: Normal pulses.  Pulmonary:     Effort: Pulmonary effort is normal. No respiratory distress.  Abdominal:     General: Abdomen is flat. Bowel sounds are normal.  Musculoskeletal:        General: Swelling present. Normal range of motion.      Cervical back: Neck supple.     Comments: Peripheral edema by the end of the day. No ulcerations or erythema.   Lymphadenopathy:     Cervical: No cervical adenopathy.  Skin:    Findings: No lesion or rash.  Neurological:  Mental Status: He is alert and oriented to person, place, and time.     Comments: A little unsteady gait - walking with cane for support. No recent falls.  Psychiatric:        Speech: Speech normal.        Behavior: Behavior normal.        Thought Content: Thought content normal.       Assessment & Plan   1. Benign hypertension BP showing systolic elevation. Tolerating Amlodipine 5 mg qd. Will check labs to assess CKD stage 3 and electrolyte balance. Restrict sodium and caffeine intake. - CBC with Differential/Platelet - Comprehensive metabolic panel - TSH  2. Hypothyroidism, unspecified type Tolerating Levothyroxine 50 mg qd. Recheck thyroid function. Some peripheral edema occasionally by the end of the day. - CBC with Differential/Platelet - TSH - T4  3. Mixed hyperlipidemia Follow up labs. Recommend limiting fats in diet. - Comprehensive metabolic panel - Lipid Panel With LDL/HDL Ratio - TSH  4. PSVT (paroxysmal supraventricular tachycardia) (HCC) Followed by cardiologist and still taking Amiodarone. Recheck routine labs. - CBC with Differential/Platelet - Comprehensive metabolic panel - TSH - T4  5. Gastroesophageal reflux disease, unspecified whether esophagitis present Rare heart burn. States it is controlled by antacid use and drinking warm fluids with meals. Does not feel it is a significant problem. Denies hematemesis, melena, or significant dysphagia. Occasionally feels large bolus of meat will get stuck if he does not chew it adequately. May use H2 blocker (OTC) if needed occasionally. If needed, may refill the Protonix prescribed by Dr. Brita Romp on 01-04-19. - CBC with Differential/Platelet - Comprehensive metabolic panel  6. Gout of  multiple sites, unspecified cause, unspecified chronicity No recent attacks. Continues Allopurinol 100 mg 1/2 tablet daily and recheck labs.  - Comprehensive metabolic panel - Uric acid  7. Edema, unspecified type End of day after standing a lot has some swelling in feet. Suspect venous insufficiency and given RX for compression stockings (20-30 mm Hg).  - Comprehensive metabolic panel  8. Insomnia, unspecified type Sleeping well using Melatonin and Trazodone at bedtime. No significant confusion or daytime drowsiness.  9. Need for influenza vaccination - Flu Vaccine QUAD High Dose(Fluad)  HPI, Exam and A&P Transcribed under the direction and in the presence of Dennis E. Chrismon, PA-C Electronically Signed: Althea Charon, [place], [facility]

## 2020-02-02 NOTE — Patient Instructions (Signed)
Food Choices for Gastroesophageal Reflux Disease, Adult When you have gastroesophageal reflux disease (GERD), the foods you eat and your eating habits are very important. Choosing the right foods can help ease your discomfort. Think about working with a nutrition specialist (dietitian) to help you make good choices. What are tips for following this plan?  Meals  Choose healthy foods that are low in fat, such as fruits, vegetables, whole grains, low-fat dairy products, and lean meat, fish, and poultry.  Eat small meals often instead of 3 large meals a day. Eat your meals slowly, and in a place where you are relaxed. Avoid bending over or lying down until 2-3 hours after eating.  Avoid eating meals 2-3 hours before bed.  Avoid drinking a lot of liquid with meals.  Cook foods using methods other than frying. Bake, grill, or broil food instead.  Avoid or limit: ? Chocolate. ? Peppermint or spearmint. ? Alcohol. ? Pepper. ? Black and decaffeinated coffee. ? Black and decaffeinated tea. ? Bubbly (carbonated) soft drinks. ? Caffeinated energy drinks and soft drinks.  Limit high-fat foods such as: ? Fatty meat or fried foods. ? Whole milk, cream, butter, or ice cream. ? Nuts and nut butters. ? Pastries, donuts, and sweets made with butter or shortening.  Avoid foods that cause symptoms. These foods may be different for everyone. Common foods that cause symptoms include: ? Tomatoes. ? Oranges, lemons, and limes. ? Peppers. ? Spicy food. ? Onions and garlic. ? Vinegar. Lifestyle  Maintain a healthy weight. Ask your doctor what weight is healthy for you. If you need to lose weight, work with your doctor to do so safely.  Exercise for at least 30 minutes for 5 or more days each week, or as told by your doctor.  Wear loose-fitting clothes.  Do not smoke. If you need help quitting, ask your doctor.  Sleep with the head of your bed higher than your feet. Use a wedge under the  mattress or blocks under the bed frame to raise the head of the bed. Summary  When you have gastroesophageal reflux disease (GERD), food and lifestyle choices are very important in easing your symptoms.  Eat small meals often instead of 3 large meals a day. Eat your meals slowly, and in a place where you are relaxed.  Limit high-fat foods such as fatty meat or fried foods.  Avoid bending over or lying down until 2-3 hours after eating.  Avoid peppermint and spearmint, caffeine, alcohol, and chocolate. This information is not intended to replace advice given to you by your health care provider. Make sure you discuss any questions you have with your health care provider. Document Revised: 04/07/2019 Document Reviewed: 01/20/2017 Elsevier Patient Education  [place]. Gout  Gout is painful swelling of your joints. Gout is a type of arthritis. It is caused by having too much uric acid in your body. Uric acid is a chemical that is made when your body breaks down substances called purines. If your body has too much uric acid, sharp crystals can form and build up in your joints. This causes pain and swelling. Gout attacks can happen quickly and be very painful (acute gout). Over time, the attacks can affect more joints and happen more often (chronic gout). What are the causes?  Too much uric acid in your blood. This can happen because: ? Your kidneys do not remove enough uric acid from your blood. ? Your body makes too much uric acid. ? You eat  too many foods that are high in purines. These foods include organ meats, some seafood, and beer.  Trauma or stress. What increases the risk?  Having a family history of gout.  Being male and middle-aged.  Being male and having gone through menopause.  Being very overweight (obese).  Drinking alcohol, especially beer.  Not having enough water in the body (being dehydrated).  Losing weight too quickly.  Having an organ  transplant.  Having lead poisoning.  Taking certain medicines.  Having kidney disease.  Having a skin condition called psoriasis. What are the signs or symptoms? An attack of acute gout usually happens in just one joint. The most common place is the big toe. Attacks often start at night. Other joints that may be affected include joints of the feet, ankle, knee, fingers, wrist, or elbow. Symptoms of an attack may include:  Very bad pain.  Warmth.  Swelling.  Stiffness.  Shiny, red, or purple skin.  Tenderness. The affected joint may be very painful to touch.  Chills and fever. Chronic gout may cause symptoms more often. More joints may be involved. You may also have white or yellow lumps (tophi) on your hands or feet or in other areas near your joints. How is this treated?  Treatment for this condition has two phases: treating an acute attack and preventing future attacks.  Acute gout treatment may include: ? NSAIDs. ? Steroids. These are taken by mouth or injected into a joint. ? Colchicine. This medicine relieves pain and swelling. It can be given by mouth or through an IV tube.  Preventive treatment may include: ? Taking small doses of NSAIDs or colchicine daily. ? Using a medicine that reduces uric acid levels in your blood. ? Making changes to your diet. You may need to see a food expert (dietitian) about what to eat and drink to prevent gout. Follow these instructions at home: During a gout attack   If told, put ice on the painful area: ? Put ice in a plastic bag. ? Place a towel between your skin and the bag. ? Leave the ice on for 20 minutes, 2-3 times a day.  Raise (elevate) the painful joint above the level of your heart as often as you can.  Rest the joint as much as possible. If the joint is in your leg, you may be given crutches.  Follow instructions from your doctor about what you cannot eat or drink. Avoiding future gout attacks  Eat a low-purine  diet. Avoid foods and drinks such as: ? Liver. ? Kidney. ? Anchovies. ? Asparagus. ? Herring. ? Mushrooms. ? Mussels. ? Beer.  Stay at a healthy weight. If you want to lose weight, talk with your doctor. Do not lose weight too fast.  Start or continue an exercise plan as told by your doctor. Eating and drinking  Drink enough fluids to keep your pee (urine) pale yellow.  If you drink alcohol: ? Limit how much you use to:  0-1 drink a day for women.  0-2 drinks a day for men. ? Be aware of how much alcohol is in your drink. In the [place] one drink equals one 12 oz bottle of beer (355 mL), one 5 oz glass of wine (148 mL), or one 1 oz glass of hard liquor (44 mL). General instructions  Take over-the-counter and prescription medicines only as told by your doctor.  Do not drive or use heavy machinery while taking prescription pain medicine.  Return to your normal  activities as told by your doctor. Ask your doctor what activities are safe for you.  Keep all follow-up visits as told by your doctor. This is important. Contact a doctor if:  You have another gout attack.  You still have symptoms of a gout attack after 10 days of treatment.  You have problems (side effects) because of your medicines.  You have chills or a fever.  You have burning pain when you pee (urinate).  You have pain in your lower back or belly. Get help right away if:  You have very bad pain.  Your pain cannot be controlled.  You cannot pee. Summary  Gout is painful swelling of the joints.  The most common site of pain is the big toe, but it can affect other joints.  Medicines and avoiding some foods can help to prevent and treat gout attacks. This information is not intended to replace advice given to you by your health care provider. Make sure you discuss any questions you have with your health care provider. Document Revised: 07/07/2018 Document Reviewed: 07/07/2018 Elsevier Patient  Education  [place].

## 2020-02-03 ENCOUNTER — Telehealth: Payer: Self-pay | Admitting: *Deleted

## 2020-02-03 DIAGNOSIS — D508 Other iron deficiency anemias: Secondary | ICD-10-CM

## 2020-02-03 MED ORDER — FERROUS SULFATE 325 (65 FE) MG PO TABS: 325.0000 mg | ORAL_TABLET | Freq: Every day | ORAL | 3 refills | Status: DC

## 2020-02-03 NOTE — Telephone Encounter (Signed)
Ferrous sulfate 325 mg qd #30 & 3 RF for iron deficiency anemia.

## 2020-02-03 NOTE — Telephone Encounter (Signed)
Rx was sent to pharmacy. Patient's daughter was advised.

## 2020-02-03 NOTE — Telephone Encounter (Signed)
-----   Message from [place], [place] sent at 02/03/2020  8:08 AM EST ----- Blood tests shows kidney function worsening and still anemic. Recommend he get back on the multivitamin with iron daily and recheck these levels in 3-4 months. Continue other meds and increase water in diet.

## 2020-02-03 NOTE — Telephone Encounter (Signed)
Patient's daughter Glenn Jarvis was notified of results. Yolanda wanted to know it Simona Huh could send a prescription for an iron supplement to [facility]. Glenn Jarvis stated she has a hard time getting patient to take otc medications. However he is usually willing to take his prescribed meds. Please advise?

## 2020-02-07 ENCOUNTER — Other Ambulatory Visit: Payer: Self-pay | Admitting: Family Medicine

## 2020-02-07 NOTE — Telephone Encounter (Signed)
Requested Prescriptions  Pending Prescriptions Disp Refills  . allopurinol (ZYLOPRIM) 100 MG tablet [Pharmacy Med Name: ALLOPURINOL 100 MG TAB] 30 tablet 3    Sig: TAKE 1/2 TABLET BY MOUTH ONCE DAILY     Endocrinology:  Gout Agents Failed - 02/07/2020  1:38 PM      Failed - Cr in normal range and within 360 days    Creat  Date Value Ref Range Status  10/22/2017 3.09 (H) 0.70 - 1.11 mg/dL Final    Comment:    Verified by repeat analysis. . For patients >51 years of age, the reference limit for Creatinine is approximately 13% higher for people identified as African-American. .    Creatinine, Ser  Date Value Ref Range Status  02/02/2020 2.78 (H) 0.76 - 1.27 mg/dL Final         Passed - Uric Acid in normal range and within 360 days    Uric Acid  Date Value Ref Range Status  02/02/2020 6.2 3.8 - 8.4 mg/dL Final    Comment:               Therapeutic target for gout patients: <6.0         Passed - Valid encounter within last 12 months    Recent Outpatient Visits          5 days ago Benign hypertension   [facility], Vickki Muff, [place]   1 year ago Benign hypertension   [facility], Vickki Muff, [place]   1 year ago Benign hypertension   [facility], Vickki Muff, [place]   1 year ago Mood disorder [facility])   [facility] Birdie Sons, [place]   1 year ago PSVT (paroxysmal supraventricular tachycardia) [facility])   [place], Vickki Muff, [place]

## 2020-03-02 ENCOUNTER — Ambulatory Visit (INDEPENDENT_AMBULATORY_CARE_PROVIDER_SITE_OTHER): Payer: Medicare HMO

## 2020-03-02 ENCOUNTER — Other Ambulatory Visit: Payer: Self-pay | Admitting: Podiatry

## 2020-03-02 ENCOUNTER — Encounter: Payer: Self-pay | Admitting: Podiatry

## 2020-03-02 ENCOUNTER — Ambulatory Visit: Payer: Medicare HMO | Admitting: Podiatry

## 2020-03-02 ENCOUNTER — Other Ambulatory Visit: Payer: Self-pay

## 2020-03-02 VITALS — Temp 97.7°F

## 2020-03-02 DIAGNOSIS — M7751 Other enthesopathy of right foot: Secondary | ICD-10-CM

## 2020-03-02 DIAGNOSIS — M10072 Idiopathic gout, left ankle and foot: Secondary | ICD-10-CM | POA: Diagnosis not present

## 2020-03-02 DIAGNOSIS — M79671 Pain in right foot: Secondary | ICD-10-CM

## 2020-03-02 DIAGNOSIS — M7752 Other enthesopathy of left foot: Secondary | ICD-10-CM

## 2020-03-02 DIAGNOSIS — M79676 Pain in unspecified toe(s): Secondary | ICD-10-CM

## 2020-03-02 DIAGNOSIS — B351 Tinea unguium: Secondary | ICD-10-CM | POA: Diagnosis not present

## 2020-03-02 LAB — CBC WITH DIFFERENTIAL/PLATELET
Basophils Absolute: 0 10*3/uL (ref 0.0–0.2)
Basos: 1 %
EOS (ABSOLUTE): 0.1 10*3/uL (ref 0.0–0.4)
Eos: 2 %
Hematocrit: 30 % — ABNORMAL LOW (ref 37.5–51.0)
Hemoglobin: 9.9 g/dL — ABNORMAL LOW (ref 13.0–17.7)
Immature Grans (Abs): 0 10*3/uL (ref 0.0–0.1)
Immature Granulocytes: 0 %
Lymphocytes Absolute: 2.3 10*3/uL (ref 0.7–3.1)
Lymphs: 35 %
MCH: 29.7 pg (ref 26.6–33.0)
MCHC: 33 g/dL (ref 31.5–35.7)
MCV: 90 fL (ref 79–97)
Monocytes Absolute: 0.7 10*3/uL (ref 0.1–0.9)
Monocytes: 10 %
Neutrophils Absolute: 3.4 10*3/uL (ref 1.4–7.0)
Neutrophils: 52 %
Platelets: 116 10*3/uL — ABNORMAL LOW (ref 150–450)
RBC: 3.33 x10E6/uL — ABNORMAL LOW (ref 4.14–5.80)
RDW: 13.6 % (ref 11.6–15.4)
WBC: 6.6 10*3/uL (ref 3.4–10.8)

## 2020-03-02 LAB — COMPREHENSIVE METABOLIC PANEL
ALT: 13 IU/L (ref 0–44)
AST: 26 IU/L (ref 0–40)
Albumin/Globulin Ratio: 1.2 (ref 1.2–2.2)
Albumin: 3.7 g/dL (ref 3.5–4.6)
Alkaline Phosphatase: 97 IU/L (ref 39–117)
BUN/Creatinine Ratio: 12 (ref 10–24)
BUN: 32 mg/dL (ref 10–36)
Bilirubin Total: 0.4 mg/dL (ref 0.0–1.2)
CO2: 20 mmol/L (ref 20–29)
Calcium: 8.8 mg/dL (ref 8.6–10.2)
Chloride: 107 mmol/L — ABNORMAL HIGH (ref 96–106)
Creatinine, Ser: 2.78 mg/dL — ABNORMAL HIGH (ref 0.76–1.27)
GFR calc Af Amer: 22 mL/min/{1.73_m2} — ABNORMAL LOW (ref >59)
GFR calc non Af Amer: 19 mL/min/{1.73_m2} — ABNORMAL LOW (ref >59)
Globulin, Total: 3.2 g/dL (ref 1.5–4.5)
Glucose: 79 mg/dL (ref 65–99)
Potassium: 4.3 mmol/L (ref 3.5–5.2)
Sodium: 141 mmol/L (ref 134–144)
Total Protein: 6.9 g/dL (ref 6.0–8.5)

## 2020-03-02 LAB — URIC ACID: Uric Acid: 6.2 mg/dL (ref 3.8–8.4)

## 2020-03-02 LAB — LIPID PANEL WITH LDL/HDL RATIO
Cholesterol, Total: 199 mg/dL (ref 100–199)
HDL: 58 mg/dL (ref >39)
LDL Chol Calc (NIH): 129 mg/dL — ABNORMAL HIGH (ref 0–99)
LDL/HDL Ratio: 2.2 ratio (ref 0.0–3.6)
Triglycerides: 63 mg/dL (ref 0–149)
VLDL Cholesterol Cal: 12 mg/dL (ref 5–40)

## 2020-03-02 LAB — T4: T4, Total: 5.4 ug/dL (ref 4.5–12.0)

## 2020-03-02 LAB — TSH

## 2020-03-02 MED ORDER — MELOXICAM 7.5 MG PO TABS: 7.5000 mg | ORAL_TABLET | Freq: Every day | ORAL | 2 refills | Status: DC

## 2020-03-05 ENCOUNTER — Other Ambulatory Visit: Payer: Self-pay

## 2020-03-05 ENCOUNTER — Telehealth: Payer: Self-pay

## 2020-03-05 DIAGNOSIS — E039 Hypothyroidism, unspecified: Secondary | ICD-10-CM

## 2020-03-05 NOTE — Telephone Encounter (Signed)
-----   Message from Margo [place], [place] sent at 03/02/2020  1:59 PM EST ----- Check with [facility] for TSH results that was drawn 4 weeks ago.

## 2020-03-05 NOTE — Telephone Encounter (Signed)
The report for the TSH was that there was a technical problem that would not allow for a result.  Then there was not enough specimen to re-test.  I think we will need to get him in for new order and specimen.

## 2020-03-05 NOTE — Telephone Encounter (Signed)
Recommend getting TSH level to follow up on hypothyroidism with high TSH and may need increase in Levothyroxine dosage.

## 2020-03-05 NOTE — Progress Notes (Signed)
   SUBJECTIVE Patient presents to office today complaining of elongated, thickened nails that cause pain while ambulating in shoes. He is unable to trim his own nails.  He also reports pain to the dorsal aspect of the left foot. He reports some associated swelling. He states the pain is mostly at night and denies modifying factors.He has had no treatment for the symptoms. Patient is here for further evaluation and treatment.  Past Medical History:  Diagnosis Date  . Arthritis   . GERD (gastroesophageal reflux disease)   . Hemorrhoid 1986  . Hyperlipidemia   . Hypertension   . SVT (supraventricular tachycardia) ([place])   . Thyroid disease   . Vertigo     OBJECTIVE General Patient is awake, alert, and oriented x 3 and in no acute distress. Derm Skin is dry and supple bilateral. Negative open lesions or macerations. Remaining integument unremarkable. Nails are tender, long, thickened and dystrophic with subungual debris, consistent with onychomycosis, 1-5 bilateral. No signs of infection noted. Vasc  DP and PT pedal pulses palpable bilaterally. Temperature gradient within normal limits.  Neuro Epicritic and protective threshold sensation grossly intact bilaterally.  Musculoskeletal Exam Pain with palpation noted to the 2nd MPJ of the left foot. No symptomatic pedal deformities noted bilateral. Muscular strength within normal limits.  Radiographic Exam:  Normal osseous mineralization. Joint spaces preserved. No fracture/dislocation/boney destruction.    ASSESSMENT 1. Onychodystrophic nails 1-5 bilateral with hyperkeratosis of nails.  2. Onychomycosis of nail due to dermatophyte bilateral 3. 2nd MPJ capsulitis left   PLAN OF CARE 1. Patient evaluated today. X-Rays reviewed.  2. Instructed to maintain good pedal hygiene and foot care.  3. Mechanical debridement of nails 1-5 bilaterally performed using a nail nipper. Filed with dremel without incident.  4. Injection of 0.5 mLs Celestone  Soluspan injected into the 2nd MPJ of the left foot.  5. Prescription for Meloxicam provided to patient. 6. Return to clinic as needed.    Edrick Kins, DPM [facility]  Dr. Edrick Kins, [address]                                        [place], [place] [address]                Office [phone]  Fax [phone]

## 2020-03-09 ENCOUNTER — Other Ambulatory Visit: Payer: Self-pay | Admitting: *Deleted

## 2020-03-09 DIAGNOSIS — E039 Hypothyroidism, unspecified: Secondary | ICD-10-CM

## 2020-03-09 LAB — TSH: TSH: 115 u[IU]/mL — ABNORMAL HIGH (ref 0.450–4.500)

## 2020-03-09 MED ORDER — LEVOTHYROXINE SODIUM 75 MCG PO TABS: 75.0000 ug | ORAL_TABLET | Freq: Every day | ORAL | 3 refills | Status: DC

## 2020-04-02 ENCOUNTER — Ambulatory Visit: Payer: Self-pay | Attending: Internal Medicine

## 2020-04-17 NOTE — Progress Notes (Deleted)
    Established patient visit   I,Genecis Veley,acting as a scribe for [employer], [employer].,have documented all relevant documentation on the behalf of [employer], [employer],as directed by  [employer] while in the presence of [employer], [place].   Patient: Glenn Mullendore Sr.   DOB: 18-Oct-1927   86 y.o. Male  MRN: [id number] Visit Date: 04/17/2020  Today's healthcare provider: Vernie Murders, PA   No chief complaint on file.  Subjective    HPI ***  {Show patient history (optional):23778::" "}   Medications: Outpatient Medications Prior to Visit  Medication Sig  . allopurinol (ZYLOPRIM) 100 MG tablet TAKE 1/2 TABLET BY MOUTH ONCE DAILY  . amiodarone (PACERONE) 200 MG tablet TAKE 1 TABLET BY MOUTH ONCE DAILY  . amLODipine (NORVASC) 5 MG tablet TAKE 1 TABLET BY MOUTH ONCE DAILY  . colchicine 0.6 MG tablet Take 0.5 tablets (0.3 mg total) by mouth daily.  . Ensure (ENSURE) Take 237 mLs by mouth 2 (two) times daily between meals.  . ferrous sulfate 325 (65 FE) MG tablet Take 1 tablet (325 mg total) by mouth daily with breakfast.  . levothyroxine (SYNTHROID) 75 MCG tablet Take 1 tablet (75 mcg total) by mouth daily.  . Melatonin 5 MG TABS Take 1 tablet (5 mg total) by mouth at bedtime.  . meloxicam (MOBIC) 7.5 MG tablet Take 1 tablet (7.5 mg total) by mouth daily.  . Multiple Vitamins-Iron (MULTIVITAMINS WITH IRON) TABS tablet Take 1 tablet by mouth daily.  . pantoprazole (PROTONIX) 40 MG tablet Take 1 tablet (40 mg total) by mouth daily.  . traZODone (DESYREL) 50 MG tablet TAKE 1 TABLET BY MOUTH AT BEDTIME  . triamcinolone (KENALOG) 0.025 % cream TRIAMCINOLONE ACETONIDE, 0.025% (External Cream)  1 (one) Cream Cream apply daily as needed for 0 days  Quantity: 60;  Refills: 0   Ordered :13-Apr-2014  [name] ;  Started 13-Apr-2014 Active Comments: Medication taken as needed.    No facility-administered medications prior to visit.    Review of Systems   Constitutional: Negative for appetite change, chills and fever.  Respiratory: Negative for chest tightness, shortness of breath and wheezing.   Cardiovascular: Negative for chest pain and palpitations.  Gastrointestinal: Negative for abdominal pain, nausea and vomiting.    {Show previous labs (optional):23779::" "}   Objective    There were no vitals taken for this visit. {Show previous vital signs (optional):23777::" "}  Physical Exam  ***  No results found for any visits on 04/20/20.   Assessment & Plan    ***  No follow-ups on file.      {provider attestation***:1}   Vernie Murders, [place] [phone] (phone) [phone] (fax)  [place]

## 2020-04-20 ENCOUNTER — Ambulatory Visit: Payer: Medicare PPO | Admitting: Family Medicine

## 2020-05-22 ENCOUNTER — Ambulatory Visit: Payer: Self-pay | Attending: Internal Medicine

## 2020-05-22 DIAGNOSIS — Z23 Encounter for immunization: Secondary | ICD-10-CM

## 2020-05-22 NOTE — Progress Notes (Signed)
° °  [facility]  Name:  Glenn Lafoy Sr.    MRN: [id number] DOB: 01-17-1927  05/22/2020  Mr. Glenn Jarvis was observed post Covid-19 immunization for 15 minutes without incident. He was provided with Vaccine Information Sheet and instruction to access the V-Safe system.   Mr. Glenn Jarvis was instructed to call 911 with any severe reactions post vaccine:  Difficulty breathing   Swelling of face and throat   A fast heartbeat   A bad rash all over body   Dizziness and weakness   Immunizations Administered    Name Date Dose VIS Date Route   Pfizer COVID-19 Vaccine 05/22/2020 12:05 PM 0.3 mL 02/22/2019 Intramuscular   Manufacturer: [place]   Lot: [id number]   [place]: [id number]

## 2020-06-12 ENCOUNTER — Ambulatory Visit: Payer: Self-pay | Attending: Internal Medicine

## 2020-06-12 DIAGNOSIS — Z23 Encounter for immunization: Secondary | ICD-10-CM

## 2020-06-12 NOTE — Progress Notes (Signed)
   [facility]  Name:  Glenn Palmeri Sr.    MRN: [id number] DOB: Nov 10, 1927  06/12/2020  Mr. Glenn Jarvis was observed post Covid-19 immunization for 15 minutes without incident. He was provided with Vaccine Information Sheet and instruction to access the V-Safe system.   Mr. Glenn Jarvis was instructed to call 911 with any severe reactions post vaccine: Marland Kitchen Difficulty breathing  . Swelling of face and throat  . A fast heartbeat  . A bad rash all over body  . Dizziness and weakness   Immunizations Administered    Name Date Dose VIS Date Route   Pfizer COVID-19 Vaccine 06/12/2020 11:13 AM 0.3 mL 02/22/2019 Intramuscular   Manufacturer: [place]   Lot: [id number]   [place]: [id number]

## 2020-09-13 ENCOUNTER — Other Ambulatory Visit: Payer: Self-pay | Admitting: Podiatry

## 2020-10-01 ENCOUNTER — Other Ambulatory Visit: Payer: Self-pay | Admitting: Family Medicine

## 2020-10-01 NOTE — Telephone Encounter (Signed)
Sent to me by mistake 

## 2020-10-09 ENCOUNTER — Ambulatory Visit: Payer: Medicare PPO | Admitting: Family Medicine

## 2020-10-09 NOTE — Progress Notes (Deleted)
{This patient's chart is due for periodic physician review. Please check 'Cosign Required' and forward to your supervising physician.:1}  Established patient visit   Patient: Glenn Ramsay Sr.   DOB: 19-Feb-1927   86 y.o. Male  MRN: [id number] Visit Date: 10/09/2020  Today's healthcare provider: Vernie Murders, PA   No chief complaint on file.  Subjective    HPI   Hypertension, follow-up  BP Readings from Last 3 Encounters:  02/02/20 (!) 182/82  12/31/18 (!) 150/74  10/12/18 (!) 164/88   Wt Readings from Last 3 Encounters:  02/02/20 142 lb (64.4 kg)  12/31/18 130 lb (59 kg)  10/12/18 140 lb 3.2 oz (63.6 kg)     He was last seen for hypertension 8 months ago.  BP at that visit was 182/82. Management since that visit includes none.  He reports good compliance with treatment. He is not having side effects.  He is following a {diet:21022986} diet. He {is/is not:9024} exercising. He {does/does not:200015} smoke.  Use of agents associated with hypertension: {bp agents assoc with hypertension:511::"none"}.   Outside blood pressures are {***enter patient reported home BP readings, or 'not being checked':1}. Symptoms: {Yes/No:20286} chest pain {Yes/No:20286} chest pressure  {Yes/No:20286} palpitations {Yes/No:20286} syncope  {Yes/No:20286} dyspnea {Yes/No:20286} orthopnea  {Yes/No:20286} paroxysmal nocturnal dyspnea {Yes/No:20286} lower extremity edema   Pertinent labs: Lab Results  Component Value Date   CHOL 199 02/02/2020   HDL 58 02/02/2020   LDLCALC 129 (H) 02/02/2020   TRIG 63 02/02/2020   CHOLHDL 3.2 04/02/2017   Lab Results  Component Value Date   NA 141 02/02/2020   K 4.3 02/02/2020   CREATININE 2.78 (H) 02/02/2020   GFRNONAA 19 (L) 02/02/2020   GFRAA 22 (L) 02/02/2020   GLUCOSE 79 02/02/2020     The ASCVD Risk score (Goff DC Jr., et al., 2013) failed to calculate for the following reasons:   The 2013 ASCVD risk score is only valid for ages 70 to 25    --------------------------------------------------------------------------------------------------- Lipid/Cholesterol, Follow-up  Last lipid panel Other pertinent labs  Lab Results  Component Value Date   CHOL 199 02/02/2020   HDL 58 02/02/2020   LDLCALC 129 (H) 02/02/2020   TRIG 63 02/02/2020   CHOLHDL 3.2 04/02/2017   Lab Results  Component Value Date   ALT 13 02/02/2020   AST 26 02/02/2020   PLT 116 (L) 02/02/2020   TSH 115.000 (H) 03/08/2020     He was last seen for this 8 months ago.  Management since that visit includes none.  He reports good compliance with treatment. He {is/is not:9024} having side effects. {document side effects if present:1}  Symptoms: {Yes/No:20286} chest pain {Yes/No:20286} chest pressure/discomfort  {Yes/No:20286} dyspnea {Yes/No:20286} lower extremity edema  {Yes/No:20286} numbness or tingling of extremity {Yes/No:20286} orthopnea  {Yes/No:20286} palpitations {Yes/No:20286} paroxysmal nocturnal dyspnea  {Yes/No:20286} speech difficulty {Yes/No:20286} syncope   Current diet: {diet habits:16563} Current exercise: {exercise types:16438}  The ASCVD Risk score Mikey Bussing DC Jr., et al., 2013) failed to calculate for the following reasons:   The 2013 ASCVD risk score is only valid for ages 27 to 45  --------------------------------------------------------------------------------------------------- Hypothyroid, follow-up  Lab Results  Component Value Date   TSH 115.000 (H) 03/08/2020   TSH CANCELED 02/02/2020   TSH 19.220 (H) 12/31/2018   T4TOTAL 5.4 02/02/2020   T4TOTAL 9.3 12/31/2018   Wt Readings from Last 3 Encounters:  02/02/20 142 lb (64.4 kg)  12/31/18 130 lb (59 kg)  10/12/18 140 lb 3.2 oz (63.6 kg)  He was last seen for hypothyroid 8 months ago.  Management since that visit includes increased Levothyroxine. He reports {excellent/good/fair/poor:19665} compliance with treatment. He {is/is not:21021397} having side effects.  {document side effects if present:1}  Symptoms: {Yes/No:20286} change in energy level {Yes/No:20286} constipation  {Yes/No:20286} diarrhea {Yes/No:20286} heat / cold intolerance  {Yes/No:20286} nervousness {Yes/No:20286} palpitations  {Yes/No:20286} weight changes    -----------------------------------------------------------------------------------------  {Show patient history (optional):23778::" "}   Medications: Outpatient Medications Prior to Visit  Medication Sig  . allopurinol (ZYLOPRIM) 100 MG tablet TAKE 1/2 TABLET BY MOUTH ONCE DAILY  . amiodarone (PACERONE) 200 MG tablet TAKE 1 TABLET BY MOUTH ONCE DAILY  . amLODipine (NORVASC) 5 MG tablet TAKE 1 TABLET BY MOUTH ONCE DAILY  . Ensure (ENSURE) Take 237 mLs by mouth 2 (two) times daily between meals.  . ferrous sulfate 325 (65 FE) MG tablet Take 1 tablet (325 mg total) by mouth daily with breakfast.  . levothyroxine (SYNTHROID) 75 MCG tablet Take 1 tablet (75 mcg total) by mouth daily.  . Melatonin 5 MG TABS Take 1 tablet (5 mg total) by mouth at bedtime.  . meloxicam (MOBIC) 7.5 MG tablet TAKE 1 TABLET BY MOUTH ONCE DAILY  . Multiple Vitamins-Iron (MULTIVITAMINS WITH IRON) TABS tablet Take 1 tablet by mouth daily.  . pantoprazole (PROTONIX) 40 MG tablet Take 1 tablet (40 mg total) by mouth daily.  . traZODone (DESYREL) 50 MG tablet TAKE 1 TABLET BY MOUTH AT BEDTIME  . triamcinolone (KENALOG) 0.025 % cream TRIAMCINOLONE ACETONIDE, 0.025% (External Cream)  1 (one) Cream Cream apply daily as needed for 0 days  Quantity: 60;  Refills: 0   Ordered :13-Apr-2014  [name] ;  Started 13-Apr-2014 Active Comments: Medication taken as needed.    No facility-administered medications prior to visit.    Review of Systems  {Heme  Chem  Endocrine  Serology  Results Review (optional):23779::" "}  Objective    There were no vitals taken for this visit. {Show previous vital signs (optional):23777::" "}  Physical Exam    ***  No results found for any visits on 10/09/20.  Assessment & Plan     ***  No follow-ups on file.      {provider attestation***:1}   Vernie Murders, [place] [phone] (phone) [phone] (fax)  [place]

## 2020-10-15 ENCOUNTER — Other Ambulatory Visit: Payer: Self-pay

## 2020-10-15 ENCOUNTER — Ambulatory Visit (INDEPENDENT_AMBULATORY_CARE_PROVIDER_SITE_OTHER): Payer: Medicare PPO | Admitting: Family Medicine

## 2020-10-15 VITALS — BP 147/96 | HR 113 | Temp 98.1°F | Wt 139.0 lb

## 2020-10-15 DIAGNOSIS — E039 Hypothyroidism, unspecified: Secondary | ICD-10-CM

## 2020-10-15 DIAGNOSIS — N183 Chronic kidney disease, stage 3 unspecified: Secondary | ICD-10-CM | POA: Diagnosis not present

## 2020-10-15 DIAGNOSIS — Z23 Encounter for immunization: Secondary | ICD-10-CM

## 2020-10-15 DIAGNOSIS — D508 Other iron deficiency anemias: Secondary | ICD-10-CM | POA: Diagnosis not present

## 2020-10-15 DIAGNOSIS — I10 Essential (primary) hypertension: Secondary | ICD-10-CM

## 2020-10-15 DIAGNOSIS — E782 Mixed hyperlipidemia: Secondary | ICD-10-CM

## 2020-10-15 NOTE — Progress Notes (Signed)
Established patient visit   Patient: Glenn Barnier Sr.   DOB: 1927/04/01   86 y.o. Male  MRN: [id number] Visit Date: 10/15/2020  Today's healthcare provider: Vernie Murders, PA   No chief complaint on file.  Subjective    HPI   Hypertension, follow-up  BP Readings from Last 3 Encounters:  02/02/20 (!) 182/82  12/31/18 (!) 150/74  10/12/18 (!) 164/88   Wt Readings from Last 3 Encounters:  02/02/20 142 lb (64.4 kg)  12/31/18 130 lb (59 kg)  10/12/18 140 lb 3.2 oz (63.6 kg)     He was last seen for hypertension 8 months ago.  BP at that visit was. Management since that visit includes none.  He reports good compliance with treatment. He is not having side effects.  He is following a Regular diet.  Symptoms: No chest pain No chest pressure  No palpitations No syncope  No dyspnea No orthopnea  No paroxysmal nocturnal dyspnea No lower extremity edema   Pertinent labs: Lab Results  Component Value Date   CHOL 199 02/02/2020   HDL 58 02/02/2020   LDLCALC 129 (H) 02/02/2020   TRIG 63 02/02/2020   CHOLHDL 3.2 04/02/2017   Lab Results  Component Value Date   NA 141 02/02/2020   K 4.3 02/02/2020   CREATININE 2.78 (H) 02/02/2020   GFRNONAA 19 (L) 02/02/2020   GFRAA 22 (L) 02/02/2020   GLUCOSE 79 02/02/2020     The ASCVD Risk score (Goff DC Jr., et al., 2013) failed to calculate for the following reasons:   The 2013 ASCVD risk score is only valid for ages 76 to 51   --------------------------------------------------------------------------------------------------- Hypothyroid, follow-up  Lab Results  Component Value Date   TSH 115.000 (H) 03/08/2020   TSH CANCELED 02/02/2020   TSH 19.220 (H) 12/31/2018   T4TOTAL 5.4 02/02/2020   T4TOTAL 9.3 12/31/2018   Wt Readings from Last 3 Encounters:  02/02/20 142 lb (64.4 kg)  12/31/18 130 lb (59 kg)  10/12/18 140 lb 3.2 oz (63.6 kg)    He was last seen for hypothyroid 8 months ago.  Management since that  visit includes adjusting Levothyroxine. Patient was to return in 3 months for recheck but did not. He reports good compliance with treatment. He is not having side effects.   Symptoms: No change in energy level No constipation  No diarrhea No heat / cold intolerance  No nervousness No palpitations  No weight changes    -----------------------------------------------------------------------------------------  Past Medical History:  Diagnosis Date   Arthritis    GERD (gastroesophageal reflux disease)    Hemorrhoid 1986   Hyperlipidemia    Hypertension    SVT (supraventricular tachycardia) ([place])    Thyroid disease    Vertigo    Past Surgical History:  Procedure Laterality Date   APPENDECTOMY  1968   BACK SURGERY     COLONOSCOPY N/A 11/27/2017   Procedure: COLONOSCOPY;  Surgeon: Toledo, Benay Pike, MD;  Location: [facility] END[facility]COPY;  Service: Gastroenterology;  Laterality: N/A;   ESOPHAGOGASTRODUODENOSCOPY (EGD) WITH PROPOFOL N/A 11/25/2017   Procedure: ESOPHAGOGASTRODUODENOSCOPY (EGD) WITH PROPOFOL;  Surgeon: Toledo, Benay Pike, MD;  Location: [facility] ENDOSCOPY;  Service: Gastroenterology;  Laterality: N/A;   EXCISIONAL HEMORRHOIDECTOMY     INGUINAL HERNIA REPAIR Right 03/17/2016   Procedure: LAPAROSCOPIC INGUINAL HERNIA;  Surgeon: Florene Glen, MD;  Location: [facility] ORS;  Service: General;  Laterality: Right;   SPINE SURGERY  1984   Family History  Problem Relation Age of Onset  Alzheimer's disease Father    Healthy Sister    Allergies  Allergen Reactions   Codeine    Cortizone-10  [Hydrocortisone]     Medications: Outpatient Medications Prior to Visit  Medication Sig   allopurinol (ZYLOPRIM) 100 MG tablet TAKE 1/2 TABLET BY MOUTH ONCE DAILY   amiodarone (PACERONE) 200 MG tablet TAKE 1 TABLET BY MOUTH ONCE DAILY   amLODipine (NORVASC) 5 MG tablet TAKE 1 TABLET BY MOUTH ONCE DAILY   Ensure (ENSURE) Take 237 mLs by mouth 2 (two) times daily between meals.   ferrous sulfate  325 (65 FE) MG tablet Take 1 tablet (325 mg total) by mouth daily with breakfast.   levothyroxine (SYNTHROID) 75 MCG tablet Take 1 tablet (75 mcg total) by mouth daily.   Melatonin 5 MG TABS Take 1 tablet (5 mg total) by mouth at bedtime.   meloxicam (MOBIC) 7.5 MG tablet TAKE 1 TABLET BY MOUTH ONCE DAILY   Multiple Vitamins-Iron (MULTIVITAMINS WITH IRON) TABS tablet Take 1 tablet by mouth daily.   pantoprazole (PROTONIX) 40 MG tablet Take 1 tablet (40 mg total) by mouth daily.   traZODone (DESYREL) 50 MG tablet TAKE 1 TABLET BY MOUTH AT BEDTIME   triamcinolone (KENALOG) 0.025 % cream TRIAMCINOLONE ACETONIDE, 0.025% (External Cream)  1 (one) Cream Cream apply daily as needed for 0 days  Quantity: 60;  Refills: 0   Ordered :13-Apr-2014  [name] ;  Started 13-Apr-2014 Active Comments: Medication taken as needed.    No facility-administered medications prior to visit.    Review of Systems  Constitutional: Negative.   HENT: Negative.   Respiratory: Negative.   Cardiovascular: Negative.   Gastrointestinal: Negative.   Genitourinary: Negative.   Musculoskeletal: Negative.        Objective    BP (!) 147/96 (BP Location: Right Arm, Patient Position: Sitting, Cuff Size: Normal)    Pulse (!) 113    Temp 98.1 F (36.7 C) (Oral)    Wt 139 lb (63 kg)    SpO2 100%    BMI 18.85 kg/m    BP Readings from Last 3 Encounters:  10/15/20 (!) 147/96  02/02/20 (!) 182/82  12/31/18 (!) 150/74   Wt Readings from Last 3 Encounters:  10/15/20 139 lb (63 kg)  02/02/20 142 lb (64.4 kg)  12/31/18 130 lb (59 kg)    Physical Exam Constitutional:      General: He is not in acute distress.    Appearance: He is well-developed.  HENT:     Head: Normocephalic and atraumatic.     Right Ear: Hearing normal.     Left Ear: Hearing normal.     Nose: Nose normal.  Eyes:     General: Lids are normal. No scleral icterus.       Right eye: No discharge.        Left eye: No discharge.      Conjunctiva/sclera: Conjunctivae normal.  Cardiovascular:     Rate and Rhythm: Normal rate.     Heart sounds: Normal heart sounds.  Pulmonary:     Effort: Pulmonary effort is normal. No respiratory distress.     Breath sounds: Normal breath sounds.  Musculoskeletal:        General: Normal range of motion.     Cervical back: Neck supple.  Skin:    Findings: No lesion or rash.  Neurological:     Mental Status: He is alert and oriented to person, place, and time.  Psychiatric:  Speech: Speech normal.        Behavior: Behavior normal.        Thought Content: Thought content normal.     No results found for any visits on 10/15/20.  Assessment & Plan     1. Benign hypertension BP improving with Amlodipine 5 mg qd. Continue to restrict salt intake, caffeine and ETOH. Recheck labs. - CBC with Differential/Platelet - Comprehensive metabolic panel - TSH  2. Hypothyroidism, unspecified type Heart rate elevated today with a little drop in weight. Recheck TSH to see if adjustments of Levothyroxine needed. TSH on 03-08-20 was 115.000 when Levothyroxine was increased to 75 mcg qd. - TSH  3. Iron deficiency anemia secondary to inadequate dietary iron intake Hgb was 9.9 with hct 30 on 02-02-20. Denies significant fatigue and appetite normal per patient. Continue iron supplement and recheck CBC. - CBC with Differential/Platelet  4. Stage 3 chronic kidney disease, unspecified whether stage 3a or 3b CKD ([place]) Followed by Dr. Holley Raring (nephrologist). Creatinine 2.78 with GFR 22 on 02-02-20. Recheck CMP today.  5. Need for prophylactic vaccination and inoculation against influenza - Flu Vaccine QUAD High Dose(Fluad)  6. Mixed hyperlipidemia Total cholesterol 199, triglycerides 63, HDL 58 and LDL 129 on 02-02-20. Continue present diet and activities. Recheck lipid panel. - Lipid Panel With LDL/HDL Ratio   No follow-ups on file.      Andres Shad, PA, have reviewed all documentation  for this visit. The documentation on 10/15/20 for the exam, diagnosis, procedures, and orders are all accurate and complete.    Vernie Murders, [place] [phone] (phone) [phone] (fax)  [place]

## 2020-10-16 LAB — COMPREHENSIVE METABOLIC PANEL
ALT: 11 IU/L (ref 0–44)
AST: 22 IU/L (ref 0–40)
Albumin/Globulin Ratio: 1.3 (ref 1.2–2.2)
Albumin: 3.7 g/dL (ref 3.5–4.6)
Alkaline Phosphatase: 93 IU/L (ref 44–121)
BUN/Creatinine Ratio: 13 (ref 10–24)
BUN: 41 mg/dL — ABNORMAL HIGH (ref 10–36)
Bilirubin Total: <0.2 mg/dL (ref 0.0–1.2)
CO2: 22 mmol/L (ref 20–29)
Calcium: 8.1 mg/dL — ABNORMAL LOW (ref 8.6–10.2)
Chloride: 106 mmol/L (ref 96–106)
Creatinine, Ser: 3.21 mg/dL — ABNORMAL HIGH (ref 0.76–1.27)
GFR calc Af Amer: 18 mL/min/{1.73_m2} — ABNORMAL LOW (ref >59)
GFR calc non Af Amer: 16 mL/min/{1.73_m2} — ABNORMAL LOW (ref >59)
Globulin, Total: 2.9 g/dL (ref 1.5–4.5)
Glucose: 89 mg/dL (ref 65–99)
Potassium: 5.1 mmol/L (ref 3.5–5.2)
Sodium: 139 mmol/L (ref 134–144)
Total Protein: 6.6 g/dL (ref 6.0–8.5)

## 2020-10-16 LAB — CBC WITH DIFFERENTIAL/PLATELET
Basophils Absolute: 0 10*3/uL (ref 0.0–0.2)
Basos: 0 %
EOS (ABSOLUTE): 0 10*3/uL (ref 0.0–0.4)
Eos: 1 %
Hematocrit: 31.7 % — ABNORMAL LOW (ref 37.5–51.0)
Hemoglobin: 10.1 g/dL — ABNORMAL LOW (ref 13.0–17.7)
Immature Grans (Abs): 0 10*3/uL (ref 0.0–0.1)
Immature Granulocytes: 0 %
Lymphocytes Absolute: 2.4 10*3/uL (ref 0.7–3.1)
Lymphs: 33 %
MCH: 29.5 pg (ref 26.6–33.0)
MCHC: 31.9 g/dL (ref 31.5–35.7)
MCV: 93 fL (ref 79–97)
Monocytes Absolute: 0.5 10*3/uL (ref 0.1–0.9)
Monocytes: 7 %
Neutrophils Absolute: 4.3 10*3/uL (ref 1.4–7.0)
Neutrophils: 59 %
Platelets: 109 10*3/uL — ABNORMAL LOW (ref 150–450)
RBC: 3.42 x10E6/uL — ABNORMAL LOW (ref 4.14–5.80)
RDW: 12.9 % (ref 11.6–15.4)
WBC: 7.3 10*3/uL (ref 3.4–10.8)

## 2020-10-16 LAB — LIPID PANEL WITH LDL/HDL RATIO
Cholesterol, Total: 176 mg/dL (ref 100–199)
HDL: 59 mg/dL (ref >39)
LDL Chol Calc (NIH): 107 mg/dL — ABNORMAL HIGH (ref 0–99)
LDL/HDL Ratio: 1.8 ratio (ref 0.0–3.6)
Triglycerides: 50 mg/dL (ref 0–149)
VLDL Cholesterol Cal: 10 mg/dL (ref 5–40)

## 2020-10-16 LAB — TSH: TSH: 98.1 u[IU]/mL — ABNORMAL HIGH (ref 0.450–4.500)

## 2020-10-18 ENCOUNTER — Telehealth: Payer: Self-pay

## 2020-10-18 DIAGNOSIS — E039 Hypothyroidism, unspecified: Secondary | ICD-10-CM

## 2020-10-18 DIAGNOSIS — N183 Chronic kidney disease, stage 3 unspecified: Secondary | ICD-10-CM

## 2020-10-18 NOTE — Telephone Encounter (Signed)
-----   Message from Margo Common, [place] sent at 10/16/2020  8:01 AM EDT ----- Thyroid function low causing kidney function to be very low. Increase Levothyroxine to 100 mcg qd #90 and recheck levels in 4-6 weeks.

## 2020-10-18 NOTE — Telephone Encounter (Signed)
Attempted to contact patient, no answer left a voicemail. Okay for [facility] to advise patient.  

## 2020-10-19 NOTE — Telephone Encounter (Signed)
Labs ordered.

## 2020-10-19 NOTE — Telephone Encounter (Signed)
Pt's daughter given result/recommendations per [employer], "Thyroid function low causing kidney function to be very low. Increase Levothyroxine to 100 mcg qd #90 and recheck levels in 4-6 weeks"; she verbalized understanding; pt's daughter given hours of operation for [facility] and will bring him in; no orders for labs noted in chart; will route to office for placement of orders.

## 2020-10-19 NOTE — Telephone Encounter (Signed)
Attempted to contact patient. Left VM for him to return call to office for lab results.

## 2020-10-19 NOTE — Addendum Note (Signed)
Addended by: Julieta Bellini on: 10/19/2020 03:14 PM   Modules accepted: Orders

## 2020-10-23 ENCOUNTER — Telehealth: Payer: Self-pay | Admitting: Family Medicine

## 2020-10-23 ENCOUNTER — Other Ambulatory Visit: Payer: Self-pay | Admitting: Family Medicine

## 2020-10-23 DIAGNOSIS — E039 Hypothyroidism, unspecified: Secondary | ICD-10-CM

## 2020-10-23 MED ORDER — LEVOTHYROXINE SODIUM 100 MCG PO TABS: 100.0000 ug | ORAL_TABLET | Freq: Every day | ORAL | 0 refills | Status: DC

## 2020-10-23 NOTE — Telephone Encounter (Signed)
Daughter called about thyroid and kidney medication / please send to [facility] asap/ please call her when sent

## 2020-10-23 NOTE — Telephone Encounter (Signed)
Done

## 2020-11-08 ENCOUNTER — Ambulatory Visit: Payer: Medicare PPO | Admitting: Family Medicine

## 2020-11-19 ENCOUNTER — Ambulatory Visit: Payer: Medicare PPO | Admitting: Family Medicine

## 2020-11-19 NOTE — Progress Notes (Deleted)
 {  This patient's chart is due for periodic physician review. Please check 'Cosign Required' and forward to your supervising physician.:1}  Established patient visit   Patient: Glenn Brosch Sr.   DOB: [birth year]   86 y.o. Male  MRN: [id number] Visit Date: 11/19/2020  Today's healthcare provider: Vernie Murders, PA   No chief complaint on file.  Subjective    HPI  The patient is a 86 year old male who presents today for follow up.  He was last seen 4 weeks ago.   His thyroid function was low on labs from that day.  He was instructed to increase his Synthroid to 100 mcg and follow up today.  Patient also had abnormal kidney function on that visit.     {Show patient history (optional):23778::" "}   Medications: Outpatient Medications Prior to Visit  Medication Sig  . allopurinol (ZYLOPRIM) 100 MG tablet TAKE 1/2 TABLET BY MOUTH ONCE DAILY  . amiodarone (PACERONE) 200 MG tablet TAKE 1 TABLET BY MOUTH ONCE DAILY  . amLODipine (NORVASC) 5 MG tablet TAKE 1 TABLET BY MOUTH ONCE DAILY  . Ensure (ENSURE) Take 237 mLs by mouth 2 (two) times daily between meals.  . ferrous sulfate 325 (65 FE) MG tablet Take 1 tablet (325 mg total) by mouth daily with breakfast.  . levothyroxine (SYNTHROID) 100 MCG tablet Take 1 tablet (100 mcg total) by mouth daily.  . Melatonin 5 MG TABS Take 1 tablet (5 mg total) by mouth at bedtime.  . meloxicam (MOBIC) 7.5 MG tablet TAKE 1 TABLET BY MOUTH ONCE DAILY  . Multiple Vitamins-Iron (MULTIVITAMINS WITH IRON) TABS tablet Take 1 tablet by mouth daily.  . pantoprazole (PROTONIX) 40 MG tablet Take 1 tablet (40 mg total) by mouth daily.  . traZODone (DESYREL) 50 MG tablet TAKE 1 TABLET BY MOUTH AT BEDTIME  . triamcinolone (KENALOG) 0.025 % cream TRIAMCINOLONE ACETONIDE, 0.025% (External Cream)  1 (one) Cream Cream apply daily as needed for 0 days  Quantity: 60;  Refills: 0   Ordered :13-Apr-2014  [name] ;  Started 13-Apr-2014 Active Comments:  Medication taken as needed.    No facility-administered medications prior to visit.    Review of Systems  {Heme  Chem  Endocrine  Serology  Results Review (optional):23779::" "}  Objective    There were no vitals taken for this visit. {Show previous vital signs (optional):23777::" "}  Physical Exam  ***  No results found for any visits on 11/19/20.  Assessment & Plan     ***  No follow-ups on file.      {provider attestation***:1}   Vernie Murders, [place] [phone] (phone) [phone] (fax)  [facility] {This patient's chart is due for periodic physician review. Please check 'Cosign Required' and forward to your supervising physician.:1}

## 2020-12-13 NOTE — Progress Notes (Deleted)
  {  This patient's chart is due for periodic physician review. Please check 'Cosign Required' and forward to your supervising physician.:1}  Established patient visit   Patient: Glenn Roessner Sr.   DOB: 30-Jul-1927   86 y.o. Male  MRN: [id number] Visit Date: 12/14/2020  Today's healthcare provider: Vernie Murders, PA-C   No chief complaint on file.  Subjective    HPI    {Show patient history (optional):23778::" "}   Medications: Outpatient Medications Prior to Visit  Medication Sig  . allopurinol (ZYLOPRIM) 100 MG tablet TAKE 1/2 TABLET BY MOUTH ONCE DAILY  . amiodarone (PACERONE) 200 MG tablet TAKE 1 TABLET BY MOUTH ONCE DAILY  . amLODipine (NORVASC) 5 MG tablet TAKE 1 TABLET BY MOUTH ONCE DAILY  . Ensure (ENSURE) Take 237 mLs by mouth 2 (two) times daily between meals.  . ferrous sulfate 325 (65 FE) MG tablet Take 1 tablet (325 mg total) by mouth daily with breakfast.  . levothyroxine (SYNTHROID) 100 MCG tablet Take 1 tablet (100 mcg total) by mouth daily.  . Melatonin 5 MG TABS Take 1 tablet (5 mg total) by mouth at bedtime.  . meloxicam (MOBIC) 7.5 MG tablet TAKE 1 TABLET BY MOUTH ONCE DAILY  . Multiple Vitamins-Iron (MULTIVITAMINS WITH IRON) TABS tablet Take 1 tablet by mouth daily.  . pantoprazole (PROTONIX) 40 MG tablet Take 1 tablet (40 mg total) by mouth daily.  . traZODone (DESYREL) 50 MG tablet TAKE 1 TABLET BY MOUTH AT BEDTIME  . triamcinolone (KENALOG) 0.025 % cream TRIAMCINOLONE ACETONIDE, 0.025% (External Cream)  1 (one) Cream Cream apply daily as needed for 0 days  Quantity: 60;  Refills: 0   Ordered :13-Apr-2014  [name] ;  Started 13-Apr-2014 Active Comments: Medication taken as needed.    No facility-administered medications prior to visit.    Review of Systems  {Heme  Chem  Endocrine  Serology  Results Review (optional):23779::" "}  Objective    There were no vitals taken for this visit. {Show previous vital signs  (optional):23777::" "}  Physical Exam  ***  No results found for any visits on 12/14/20.  Assessment & Plan     ***  No follow-ups on file.      {provider attestation***:1}   Vernie Murders, Hershal Coria  [facility] [phone] (phone) [phone] (fax)  [place]

## 2020-12-14 ENCOUNTER — Ambulatory Visit: Payer: Medicare PPO | Admitting: Family Medicine

## 2020-12-17 ENCOUNTER — Telehealth: Payer: Self-pay | Admitting: Family Medicine

## 2020-12-17 NOTE — Telephone Encounter (Signed)
Copied from [place] [phone]. Topic: Medicare AWV >> Dec 17, 2020  1:18 PM Cher Nakai R wrote: Reason for CRM:  Left message for patient to call back and schedule Medicare Annual Wellness Visit (AWV) in office.   If not able to come in office, please offer to do virtually.   Last AWV 04/02/2017  Please schedule at anytime with [facility] Health Advisor.  If any questions, please contact me at [phone]

## 2021-01-02 ENCOUNTER — Ambulatory Visit (INDEPENDENT_AMBULATORY_CARE_PROVIDER_SITE_OTHER): Payer: Medicare PPO

## 2021-01-02 ENCOUNTER — Other Ambulatory Visit: Payer: Self-pay

## 2021-01-02 DIAGNOSIS — Z23 Encounter for immunization: Secondary | ICD-10-CM

## 2021-01-03 ENCOUNTER — Encounter: Payer: Self-pay | Admitting: Internal Medicine

## 2021-01-03 ENCOUNTER — Ambulatory Visit: Payer: Medicare PPO | Admitting: Family Medicine

## 2021-01-03 ENCOUNTER — Emergency Department: Payer: Medicare PPO

## 2021-01-03 ENCOUNTER — Observation Stay
Admission: EM | Admit: 2021-01-03 | Discharge: 2021-01-04 | Disposition: A | Discharge status: Home / Self Care | Payer: Medicare PPO | Attending: Internal Medicine | Admitting: Internal Medicine

## 2021-01-03 ENCOUNTER — Other Ambulatory Visit: Payer: Self-pay

## 2021-01-03 DIAGNOSIS — N184 Chronic kidney disease, stage 4 (severe): Secondary | ICD-10-CM | POA: Diagnosis not present

## 2021-01-03 DIAGNOSIS — D631 Anemia in chronic kidney disease: Secondary | ICD-10-CM | POA: Diagnosis present

## 2021-01-03 DIAGNOSIS — K921 Melena: Secondary | ICD-10-CM | POA: Insufficient documentation

## 2021-01-03 DIAGNOSIS — Z87891 Personal history of nicotine dependence: Secondary | ICD-10-CM | POA: Diagnosis not present

## 2021-01-03 DIAGNOSIS — K5521 Angiodysplasia of colon with hemorrhage: Secondary | ICD-10-CM | POA: Diagnosis not present

## 2021-01-03 DIAGNOSIS — R4701 Aphasia: Secondary | ICD-10-CM | POA: Insufficient documentation

## 2021-01-03 DIAGNOSIS — G9341 Metabolic encephalopathy: Secondary | ICD-10-CM | POA: Diagnosis not present

## 2021-01-03 DIAGNOSIS — Z20822 Contact with and (suspected) exposure to covid-19: Secondary | ICD-10-CM | POA: Diagnosis not present

## 2021-01-03 DIAGNOSIS — D508 Other iron deficiency anemias: Secondary | ICD-10-CM

## 2021-01-03 DIAGNOSIS — R410 Disorientation, unspecified: Secondary | ICD-10-CM

## 2021-01-03 DIAGNOSIS — D649 Anemia, unspecified: Secondary | ICD-10-CM | POA: Diagnosis not present

## 2021-01-03 DIAGNOSIS — K922 Gastrointestinal hemorrhage, unspecified: Secondary | ICD-10-CM

## 2021-01-03 DIAGNOSIS — K641 Second degree hemorrhoids: Secondary | ICD-10-CM | POA: Diagnosis not present

## 2021-01-03 DIAGNOSIS — Z79899 Other long term (current) drug therapy: Secondary | ICD-10-CM | POA: Insufficient documentation

## 2021-01-03 DIAGNOSIS — E039 Hypothyroidism, unspecified: Secondary | ICD-10-CM | POA: Diagnosis not present

## 2021-01-03 DIAGNOSIS — R2681 Unsteadiness on feet: Secondary | ICD-10-CM | POA: Diagnosis not present

## 2021-01-03 DIAGNOSIS — R5381 Other malaise: Secondary | ICD-10-CM

## 2021-01-03 DIAGNOSIS — K625 Hemorrhage of anus and rectum: Secondary | ICD-10-CM | POA: Diagnosis present

## 2021-01-03 DIAGNOSIS — I129 Hypertensive chronic kidney disease with stage 1 through stage 4 chronic kidney disease, or unspecified chronic kidney disease: Secondary | ICD-10-CM | POA: Diagnosis not present

## 2021-01-03 LAB — DIFFERENTIAL
Abs Immature Granulocytes: 0.06 10*3/uL (ref 0.00–0.07)
Basophils Absolute: 0.1 10*3/uL (ref 0.0–0.1)
Basophils Relative: 1 %
Eosinophils Absolute: 0 10*3/uL (ref 0.0–0.5)
Eosinophils Relative: 0 %
Immature Granulocytes: 1 %
Lymphocytes Relative: 10 %
Lymphs Abs: 1 10*3/uL (ref 0.7–4.0)
Monocytes Absolute: 0.6 10*3/uL (ref 0.1–1.0)
Monocytes Relative: 6 %
Neutro Abs: 9 10*3/uL — ABNORMAL HIGH (ref 1.7–7.7)
Neutrophils Relative %: 82 %

## 2021-01-03 LAB — CBC
HCT: 23.3 % — ABNORMAL LOW (ref 39.0–52.0)
Hemoglobin: 7.3 g/dL — ABNORMAL LOW (ref 13.0–17.0)
MCH: 30.4 pg (ref 26.0–34.0)
MCHC: 31.3 g/dL (ref 30.0–36.0)
MCV: 97.1 fL (ref 80.0–100.0)
Platelets: 88 10*3/uL — ABNORMAL LOW (ref 150–400)
RBC: 2.4 MIL/uL — ABNORMAL LOW (ref 4.22–5.81)
RDW: 14.5 % (ref 11.5–15.5)
WBC: 10.8 10*3/uL — ABNORMAL HIGH (ref 4.0–10.5)
nRBC: 0 % (ref 0.0–0.2)

## 2021-01-03 LAB — IRON AND TIBC
Iron: 37 ug/dL — ABNORMAL LOW (ref 45–182)
Saturation Ratios: 16 % — ABNORMAL LOW (ref 17.9–39.5)
TIBC: 239 ug/dL — ABNORMAL LOW (ref 250–450)
UIBC: 202 ug/dL

## 2021-01-03 LAB — RETICULOCYTES
Immature Retic Fract: 11.6 % (ref 2.3–15.9)
RBC.: 2.45 MIL/uL — ABNORMAL LOW (ref 4.22–5.81)
Retic Count, Absolute: 33.1 10*3/uL (ref 19.0–186.0)
Retic Ct Pct: 1.4 % (ref 0.4–3.1)

## 2021-01-03 LAB — RESP PANEL BY RT-PCR (FLU A&B, COVID) ARPGX2
Influenza A by PCR: NEGATIVE
Influenza B by PCR: NEGATIVE
SARS Coronavirus 2 by RT PCR: NEGATIVE

## 2021-01-03 LAB — COMPREHENSIVE METABOLIC PANEL
ALT: 11 U/L (ref 0–44)
AST: 20 U/L (ref 15–41)
Albumin: 2.9 g/dL — ABNORMAL LOW (ref 3.5–5.0)
Alkaline Phosphatase: 53 U/L (ref 38–126)
Anion gap: 10 (ref 5–15)
BUN: 44 mg/dL — ABNORMAL HIGH (ref 8–23)
CO2: 20 mmol/L — ABNORMAL LOW (ref 22–32)
Calcium: 7.7 mg/dL — ABNORMAL LOW (ref 8.9–10.3)
Chloride: 109 mmol/L (ref 98–111)
Creatinine, Ser: 3.45 mg/dL — ABNORMAL HIGH (ref 0.61–1.24)
GFR, Estimated: 16 mL/min — ABNORMAL LOW (ref >60)
Glucose, Bld: 133 mg/dL — ABNORMAL HIGH (ref 70–99)
Potassium: 4.8 mmol/L (ref 3.5–5.1)
Sodium: 139 mmol/L (ref 135–145)
Total Bilirubin: 0.6 mg/dL (ref 0.3–1.2)
Total Protein: 6 g/dL — ABNORMAL LOW (ref 6.5–8.1)

## 2021-01-03 LAB — APTT: aPTT: 24 seconds (ref 24–36)

## 2021-01-03 LAB — CBG MONITORING, ED
Glucose-Capillary: 109 mg/dL — ABNORMAL HIGH (ref 70–99)
Glucose-Capillary: 125 mg/dL — ABNORMAL HIGH (ref 70–99)

## 2021-01-03 LAB — URIC ACID: Uric Acid, Serum: 7.5 mg/dL (ref 3.7–8.6)

## 2021-01-03 LAB — PREPARE RBC (CROSSMATCH)

## 2021-01-03 LAB — HEMOGLOBIN AND HEMATOCRIT, BLOOD
HCT: 21.4 % — ABNORMAL LOW (ref 39.0–52.0)
Hemoglobin: 6.9 g/dL — ABNORMAL LOW (ref 13.0–17.0)

## 2021-01-03 LAB — FERRITIN: Ferritin: 35 ng/mL (ref 24–336)

## 2021-01-03 LAB — PROTIME-INR
INR: 1.2 (ref 0.8–1.2)
Prothrombin Time: 14.5 seconds (ref 11.4–15.2)

## 2021-01-03 MED ORDER — PEG 3350-KCL-NA BICARB-NACL 420 G PO SOLR
4000.0000 mL | Freq: Once | ORAL | Status: DC
  Filled 2021-01-03: qty 4000

## 2021-01-03 MED ORDER — CALCIUM CARBONATE-VITAMIN D 500-200 MG-UNIT PO TABS
1.0000 | ORAL_TABLET | Freq: Two times a day (BID) | ORAL | Status: DC
  Administered 2021-01-03 (×2): 1 via ORAL
  Filled 2021-01-03 (×3): qty 1

## 2021-01-03 MED ORDER — MELATONIN 5 MG PO TABS
5.0000 mg | ORAL_TABLET | Freq: Every day | ORAL | Status: DC
  Administered 2021-01-03: 5 mg via ORAL
  Filled 2021-01-03: qty 1

## 2021-01-03 MED ORDER — SODIUM BICARBONATE 650 MG PO TABS
650.0000 mg | ORAL_TABLET | Freq: Two times a day (BID) | ORAL | Status: DC
Start: 2021-01-03 — End: 2021-01-05
  Administered 2021-01-03 (×2): 650 mg via ORAL
  Filled 2021-01-03 (×3): qty 1

## 2021-01-03 MED ORDER — LEVOTHYROXINE SODIUM 100 MCG PO TABS: 100.0000 ug | ORAL_TABLET | Freq: Every day | ORAL | Status: DC

## 2021-01-03 MED ORDER — AMIODARONE HCL 200 MG PO TABS
200.0000 mg | ORAL_TABLET | Freq: Every day | ORAL | Status: DC
  Administered 2021-01-03 – 2021-01-04 (×2): 200 mg via ORAL
  Filled 2021-01-03 (×2): qty 1

## 2021-01-03 MED ORDER — ENSURE ENLIVE PO LIQD: 237.0000 mL | Freq: Two times a day (BID) | ORAL | Status: DC

## 2021-01-03 MED ORDER — ACETAMINOPHEN 650 MG RE SUPP: 650.0000 mg | Freq: Four times a day (QID) | RECTAL | Status: DC | PRN

## 2021-01-03 MED ORDER — SODIUM CHLORIDE 0.9% FLUSH
3.0000 mL | Freq: Once | INTRAVENOUS | Status: AC
Start: 2021-01-03 — End: 2021-01-03
  Administered 2021-01-03: 3 mL via INTRAVENOUS

## 2021-01-03 MED ORDER — PANTOPRAZOLE SODIUM 40 MG IV SOLR
40.0000 mg | Freq: Two times a day (BID) | INTRAVENOUS | Status: DC
  Administered 2021-01-03 – 2021-01-04 (×3): 40 mg via INTRAVENOUS
  Filled 2021-01-03 (×3): qty 40

## 2021-01-03 MED ORDER — FERROUS SULFATE 325 (65 FE) MG PO TABS
325.0000 mg | ORAL_TABLET | Freq: Every day | ORAL | Status: DC
  Filled 2021-01-03: qty 1

## 2021-01-03 MED ORDER — ACETAMINOPHEN 325 MG PO TABS: 650.0000 mg | ORAL_TABLET | Freq: Four times a day (QID) | ORAL | Status: DC | PRN

## 2021-01-03 MED ORDER — TRAZODONE HCL 50 MG PO TABS
50.0000 mg | ORAL_TABLET | Freq: Every day | ORAL | Status: DC
  Administered 2021-01-03: 50 mg via ORAL
  Filled 2021-01-03: qty 1

## 2021-01-03 MED ORDER — SODIUM CHLORIDE 0.9% IV SOLUTION: Freq: Once | INTRAVENOUS | Status: AC

## 2021-01-03 MED ORDER — TAB-A-VITE/IRON PO TABS
1.0000 | ORAL_TABLET | Freq: Every day | ORAL | Status: DC
  Filled 2021-01-03: qty 1

## 2021-01-03 MED ORDER — ALLOPURINOL 100 MG PO TABS
50.0000 mg | ORAL_TABLET | Freq: Every day | ORAL | Status: DC
  Administered 2021-01-03: 50 mg via ORAL
  Filled 2021-01-03: qty 0.5

## 2021-01-03 NOTE — Care Plan (Signed)
Pt to unit via stretcher at this time, pt was able to move to bed with minimal assistance. Pt has odor to him and in his brief, with Linna Hoff, RN as a witness, he had caked stool with bright red blood jelly like in brief. Brief was saturated. In report from [place], [place] from ED, she stated he did not have any stool with her. It took this Probation officer 10 minutes to clean off as it was hardened to patients body. Pt voided upon arrival to unit, 373ml . VSS. Pt is AxOx4. Pt changed into gown, cleansed at this time. Pt oriented to room and how to call for help at this time. Pt states he uses a walker and cane at home and dont walk much, Noted bruising to bilateral arms. IV re- enforced as dressing as coming off.

## 2021-01-03 NOTE — ED Notes (Signed)
Called dietary regarding pt clear liquid meal tray at this time. Per dietary, tray is on the way. Informed pt and family member at this time

## 2021-01-03 NOTE — H&P (Addendum)
History and Physical    Glenn Jarvis WCB:[id number] DOB: [birth year] DOA: 01/03/2021  PCP: Margo Common, PA-C (Confirm with patient/family/NH records and if not entered, this has to be entered at [facility] point of entry) Patient coming from: Home  I have personally briefly reviewed patient's old medical records in [place]  Chief Complaint: Pilar Plate blood per rectum  HPI: Glenn Jarvis. is a 87 y.o. male with medical history significant of GI bleed secondary to colonic AVM (colonoscopy 2018), CKD stage IV, HTN, SVT, hypothyroidism, presented with lower GI bleed.  First episode happened last night, denies any abdominal pain, no nauseous vomit, first episode was a small BM mixed with blood, moderate, she has had 5-6 more such episodes, and more blood dominant.  Patient also stated stool is formed and brownish in color.  Patient felt dizzy afterward when large bloody BM, and family reported patient was sudden stopped talking, but remained conscious, the whole episode lasted about 10 minutes. ED Course: Code stroke or triggered, CT head negative for acute findings.  No focal neuro deficit, blood work acute anemia with hemoglobin 7.3  Review of Systems: As per HPI otherwise 14 point review of systems negative.    Past Medical History:  Diagnosis Date  . Arthritis   . GERD (gastroesophageal reflux disease)   . Hemorrhoid 1986  . Hyperlipidemia   . Hypertension   . SVT (supraventricular tachycardia) ([place])   . Thyroid disease   . Vertigo     Past Surgical History:  Procedure Laterality Date  . APPENDECTOMY  1968  . BACK SURGERY    . COLONOSCOPY N/A 11/27/2017   Procedure: COLONOSCOPY;  Surgeon: Toledo, Benay Pike, MD;  Location: [facility] ENDOSCOPY;  Service: Gastroenterology;  Laterality: N/A;  . ESOPHAGOGASTRODUODENOSCOPY (EGD) WITH PROPOFOL N/A 11/25/2017   Procedure: ESOPHAGOGASTRODUODENOSCOPY (EGD) WITH PROPOFOL;  Surgeon: Toledo, Benay Pike, MD;  Location: [facility] ENDOSCOPY;   Service: Gastroenterology;  Laterality: N/A;  . EXCISIONAL HEMORRHOIDECTOMY    . INGUINAL HERNIA REPAIR Right 03/17/2016   Procedure: LAPAROSCOPIC INGUINAL HERNIA;  Surgeon: Florene Glen, MD;  Location: [facility] ORS;  Service: General;  Laterality: Right;  . [place]     reports that he quit smoking about 55 years ago. He has a 50.00 pack-year smoking history. He has never used smokeless tobacco. He reports that he does not drink alcohol and does not use drugs.  Allergies  Allergen Reactions  . Codeine   . Cortizone-10  [Hydrocortisone]     Family History  Problem Relation Age of Onset  . Alzheimer's disease Father   . Healthy Sister      Prior to Admission medications   Medication Sig Start Date End Date Taking? Authorizing Provider  allopurinol (ZYLOPRIM) 100 MG tablet TAKE 1/2 TABLET BY MOUTH ONCE DAILY 02/07/20   Chrismon, Vickki Muff, PA-C  amiodarone (PACERONE) 200 MG tablet TAKE 1 TABLET BY MOUTH ONCE DAILY 10/01/20   Chrismon, Simona Huh E, PA-C  amLODipine (NORVASC) 5 MG tablet TAKE 1 TABLET BY MOUTH ONCE DAILY 10/01/20   Chrismon, Vickki Muff, PA-C  Ensure (ENSURE) Take 237 mLs by mouth 2 (two) times daily between meals. 03/14/19   Chrismon, Vickki Muff, PA-C  ferrous sulfate 325 (65 FE) MG tablet Take 1 tablet (325 mg total) by mouth daily with breakfast. 02/03/20   Chrismon, Vickki Muff, PA-C  levothyroxine (SYNTHROID) 100 MCG tablet Take 1 tablet (100 mcg total) by mouth daily. 10/23/20   Chrismon, Vickki Muff, PA-C  Melatonin 5 MG TABS Take 1 tablet (5 mg total) by mouth at bedtime. 04/02/18   Loletha Grayer, MD  meloxicam (MOBIC) 7.5 MG tablet TAKE 1 TABLET BY MOUTH ONCE DAILY 09/16/20   Edrick Kins, DPM  Multiple Vitamins-Iron (MULTIVITAMINS WITH IRON) TABS tablet Take 1 tablet by mouth daily. 01/04/19   Chrismon, Vickki Muff, PA-C  pantoprazole (PROTONIX) 40 MG tablet Take 1 tablet (40 mg total) by mouth daily. 01/05/20   Virginia Crews, MD  traZODone (DESYREL) 50 MG tablet TAKE 1  TABLET BY MOUTH AT BEDTIME 10/01/20   Chrismon, Vickki Muff, PA-C  triamcinolone (KENALOG) 0.025 % cream TRIAMCINOLONE ACETONIDE, 0.025% (External Cream)  1 (one) Cream Cream apply daily as needed for 0 days  Quantity: 60;  Refills: 0   Ordered :13-Apr-2014  [name] ;  Started 13-Apr-2014 Active Comments: Medication taken as needed.  04/13/14   [provider]    Physical Exam: Vitals:   01/03/21 1105 01/03/21 1145 01/03/21 1216 01/03/21 1252  BP:  135/71 (!) 142/67 (!) 142/67  Pulse:  66 (!) 58 (!) 58  Resp:  18 18 16   Temp:  97.8 F (36.6 C)    TempSrc:  Oral    SpO2:  100% 98% 100%  Weight: 63 kg     Height: 6' (1.829 m)       Constitutional: NAD, calm, comfortable Vitals:   01/03/21 1105 01/03/21 1145 01/03/21 1216 01/03/21 1252  BP:  135/71 (!) 142/67 (!) 142/67  Pulse:  66 (!) 58 (!) 58  Resp:  18 18 16   Temp:  97.8 F (36.6 C)    TempSrc:  Oral    SpO2:  100% 98% 100%  Weight: 63 kg     Height: 6' (1.829 m)      Eyes: PERRL, lids and conjunctivae normal ENMT: Mucous membranes are moist. Posterior pharynx clear of any exudate or lesions.Normal dentition.  Neck: normal, supple, no masses, no thyromegaly Respiratory: clear to auscultation bilaterally, no wheezing, no crackles. Normal respiratory effort. No accessory muscle use.  Cardiovascular: Regular rate and rhythm, no murmurs / rubs / gallops. No extremity edema. 2+ pedal pulses. No carotid bruits.  Abdomen: no tenderness, no masses palpated. No hepatosplenomegaly. Bowel sounds positive.  Musculoskeletal: no clubbing / cyanosis. No joint deformity upper and lower extremities. Good ROM, no contractures. Normal muscle tone.  Skin: no rashes, lesions, ulcers. No induration Neurologic: CN 2-12 grossly intact. Sensation intact, DTR normal. Strength 5/5 in all 4.  Psychiatric: Normal judgment and insight. Alert and oriented x 3. Normal mood.     Labs on Admission: I have personally reviewed following  labs and imaging studies  CBC: Recent Labs  Lab 01/03/21 1039  WBC 10.8*  NEUTROABS 9.0*  HGB 7.3*  HCT 23.3*  MCV 97.1  PLT 88*   Basic Metabolic Panel: Recent Labs  Lab 01/03/21 1039  NA 139  K 4.8  CL 109  CO2 20*  GLUCOSE 133*  BUN 44*  CREATININE 3.45*  CALCIUM 7.7*   GFR: Estimated Creatinine Clearance: 12.2 mL/min (A) (by C-G formula based on SCr of 3.45 mg/dL (H)). Liver Function Tests: Recent Labs  Lab 01/03/21 1039  AST 20  ALT 11  ALKPHOS 53  BILITOT 0.6  PROT 6.0*  ALBUMIN 2.9*   No results for input(s): LIPASE, AMYLASE in the last 168 hours. No results for input(s): AMMONIA in the last 168 hours. Coagulation Profile: Recent Labs  Lab 01/03/21 1039  INR 1.2  Cardiac Enzymes: No results for input(s): CKTOTAL, CKMB, CKMBINDEX, TROPONINI in the last 168 hours. BNP (last 3 results) No results for input(s): PROBNP in the last 8760 hours. HbA1C: No results for input(s): HGBA1C in the last 72 hours. CBG: Recent Labs  Lab 01/03/21 1010 01/03/21 1033  GLUCAP 109* 125*   Lipid Profile: No results for input(s): CHOL, HDL, LDLCALC, TRIG, CHOLHDL, LDLDIRECT in the last 72 hours. Thyroid Function Tests: No results for input(s): TSH, T4TOTAL, FREET4, T3FREE, THYROIDAB in the last 72 hours. Anemia Panel: No results for input(s): VITAMINB12, FOLATE, FERRITIN, TIBC, IRON, RETICCTPCT in the last 72 hours. Urine analysis:    Component Value Date/Time   COLORURINE YELLOW (A) 03/29/2018 1300   APPEARANCEUR CLEAR (A) 03/29/2018 1300   LABSPEC 1.014 03/29/2018 1300   PHURINE 5.0 03/29/2018 1300   GLUCOSEU NEGATIVE 03/29/2018 1300   HGBUR SMALL (A) 03/29/2018 1300   BILIRUBINUR NEGATIVE 03/29/2018 1300   KETONESUR NEGATIVE 03/29/2018 1300   PROTEINUR 30 (A) 03/29/2018 1300   NITRITE NEGATIVE 03/29/2018 1300   LEUKOCYTESUR NEGATIVE 03/29/2018 1300    Radiological Exams on Admission: CT HEAD CODE STROKE WO CONTRAST  Result Date:  01/03/2021 CLINICAL DATA:  Code stroke.  Aphasia. EXAM: CT HEAD WITHOUT CONTRAST TECHNIQUE: Contiguous axial images were obtained from the base of the skull through the vertex without intravenous contrast. COMPARISON:  11/23/2017 FINDINGS: Brain: No evidence of acute infarction, hemorrhage, hydrocephalus, extra-axial collection or mass lesion/mass effect. Chronic white matter disease superimposed on extensive dilated perivascular spaces. Cerebral volume loss which is mild for age. Vascular: No hyperdense vessel or unexpected calcification. Skull: Negative Sinuses/Orbits: Negative Other: These results were called by telephone at the time of interpretation on 01/03/2021 at 10:34 am to provider [facility] , who verbally acknowledged these results. ASPECTS [facility] Stroke Program Early CT Score) - Ganglionic level infarction (caudate, lentiform nuclei, internal capsule, insula, M1-M3 cortex): 7 - Supraganglionic infarction (M4-M6 cortex): 3 Total score (0-10 with 10 being normal): 10 IMPRESSION: 1. No acute finding. 2. Chronic small vessel disease superimposed on extensive dilated perivascular spaces. Electronically Signed   By: Monte Fantasia M.D.   On: 01/03/2021 10:36    EKG: Independently reviewed. Sinus, borderline bradycardia.  Assessment/Plan Active Problems:   GI bleed  (please populate well all problems here in Problem List. (For example, if patient is on BP meds at home and you resume or decide to hold them, it is a problem that needs to be her. Same for CAD, COPD, HLD and so on)  Acute blood loss anemia secondary to lower GI bleed -Likely 2nd to either colon AVM vs diverticulosis -PPI, GI plan to pan-scope in AM. Allow clear liquid diet and NPO after midnight. -No significant tachycardia or drop of BP, plan to repeat H/H tonight, transfuse if patient becomes symptomatic or significant drop of H&H. -Iron level, Reticulocyte count  Pre-syncope -Likely 2/2 to acute blood loss, probably had a  transient vasovagal reaction -Ortho static vital sign x2 -Consider PRBC if becomes symptomatic or other signs of unstable hemodynamic.  CKD stage IV -Euvolumic -With metabolic acidosis, non-anion gap metabolic acidosis, will start bicarb p.o. supplement. -Slowly worsening Cre compared to before. -D/W pt, appears does not have a nephro, will consult transition care team. -Check uric acid level  Hx of SVT -Continue amiodarone  HTN -Hold Amlodipine for now.  Hypothyroid -Continue Synthroid  DVT prophylaxis: SCD Code Status: Full Code Family Communication: None at bedside Disposition Plan: Expect more than 2 midnight hospital stay  for GI workin Consults called: GI Admission status: Tele admit   Lequita Halt MD [facility] Pager [phone]  01/03/2021, 1:24 PM

## 2021-01-03 NOTE — ED Notes (Signed)
Cancelled Code Stroke per [facility] by Dr. Theda Sers called [place] in [place]

## 2021-01-03 NOTE — Progress Notes (Signed)
   01/03/21 0945  Clinical Encounter Type  Visited With Patient and family together  Visit Type Initial;Spiritual support;Social support  Referral From Nurse  Consult/Referral To Chaplain  Responded to [facility] for code stroke in ED. I introduce myself to PT and family member. Pt son said Pt was doing a lot better. While on visit code stroke cancelled. Ch will follow up later.

## 2021-01-03 NOTE — ED Notes (Signed)
Per MD Theda Sers, cancel CODE STROKE at this time. Receptionist Luanne notified at this time

## 2021-01-03 NOTE — ED Notes (Signed)
CODE STROKE activated

## 2021-01-03 NOTE — ED Triage Notes (Signed)
Pt to ED via [facility], see first RN note. This RN spoke with pt's son, pt's son states that he told EMS pt's father's speech resolving however speech is not resolved, son reports pt is normally A&O and able to speak in full and complete sentences. Pt with noted L arm drift, L arm numbness. Pt with noted difficulty speaking full words, noted to be stuttering at this time.

## 2021-01-03 NOTE — ED Notes (Signed)
Code stroke activated from triage, pt taken to CT and then to [facility], blood noted on pts arms, pants and blankets, per family pt has had blood in his stool since last night, states this AM pt was using the toilet and began not speaking, initial NIHSS 0, pt has non focal symptoms, Dr. Theda Sers at bedside, Dr. Tamala Julian at bedside, Code Stroke canceled @ 1035 per Dr. Theda Sers, report off to [facility]

## 2021-01-03 NOTE — ED Notes (Signed)
Clear liquid diet tray given to pt at this time

## 2021-01-03 NOTE — ED Notes (Signed)
[employer] not working on 2 different computers at this time, will send covid swab with chart label at this time

## 2021-01-03 NOTE — ED Notes (Signed)
This RN, Minette Brine RN, and Denton Ar RN attempted to get 2nd PIV for this pt. If needed, will contact IV team for consult

## 2021-01-03 NOTE — ED Provider Notes (Signed)
[facility] Emergency Department Provider Note ____________________________________________   Event Date/Time   First MD Initiated Contact with Patient 01/03/21 1015     (approximate)  I have reviewed the triage vital signs and the nursing notes.  HISTORY  Chief Complaint Altered Mental Status   HPI Glenn Shadd Sr. is a 87 y.o. malewho presents to the ED for evaluation of transient altered mentation.  Chart review indicates history of SVT on amiodarone, HTN, hypothyroidism, GERD, CKD and gout.  Patient visited ED initially as a stroke alert due to transient episode of aphasia/unresponsiveness while in the toilet.  No falls, trauma or reported syncopal episode.  Patient reports, and son at the bedside corroborates, that patient has had about 12 hours of lower GI bleeding.  They report hematochezia, about 6 episodes, without abdominal pain or emesis.  No recent fevers or acute illnesses.  History somewhat limited due to acuity of condition upon presentation.  Chart review shows 2018 colonoscopy with both diverticular disease and multiple AVMs  Past Medical History:  Diagnosis Date  . Arthritis   . GERD (gastroesophageal reflux disease)   . Hemorrhoid 1986  . Hyperlipidemia   . Hypertension   . SVT (supraventricular tachycardia) ([place])   . Thyroid disease   . Vertigo     Patient Active Problem List   Diagnosis Date Noted  . Sustained SVT ([place]) 03/31/2018  . PSVT (paroxysmal supraventricular tachycardia) ([place]) 03/29/2018  . Syncope and collapse 11/24/2017  . Edema 10/05/2015  . Allergic rhinitis 07/05/2015  . Arthritis 07/05/2015  . Benign hypertension 07/05/2015  . Chronic kidney disease (CKD), stage III (moderate) ([place]) 07/05/2015  . Acid reflux 07/05/2015  . Gout 07/05/2015  . HLD (hyperlipidemia) 07/05/2015  . Adult hypothyroidism 07/05/2015  . Cannot sleep 07/05/2015  . Hernia, inguinal 07/05/2015  . LBP (low back pain) 07/05/2015   . Cramp in muscle 07/05/2015  . Leg paresthesia 07/05/2015  . CA of prostate ([place]) 07/05/2015  . Cutaneous eruption 07/05/2015  . Head revolving around 07/05/2015  . Hypothyroidism 07/03/2015  . Right inguinal hernia 05/30/2014    Past Surgical History:  Procedure Laterality Date  . APPENDECTOMY  1968  . BACK SURGERY    . COLONOSCOPY N/A 11/27/2017   Procedure: COLONOSCOPY;  Surgeon: Toledo, Benay Pike, MD;  Location: [facility] ENDOSCOPY;  Service: Gastroenterology;  Laterality: N/A;  . ESOPHAGOGASTRODUODENOSCOPY (EGD) WITH PROPOFOL N/A 11/25/2017   Procedure: ESOPHAGOGASTRODUODENOSCOPY (EGD) WITH PROPOFOL;  Surgeon: Toledo, Benay Pike, MD;  Location: [facility] ENDOSCOPY;  Service: Gastroenterology;  Laterality: N/A;  . EXCISIONAL HEMORRHOIDECTOMY    . INGUINAL HERNIA REPAIR Right 03/17/2016   Procedure: LAPAROSCOPIC INGUINAL HERNIA;  Surgeon: Florene Glen, MD;  Location: [facility] ORS;  Service: General;  Laterality: Right;  . [place]    Prior to Admission medications   Medication Sig Start Date End Date Taking? Authorizing Provider  allopurinol (ZYLOPRIM) 100 MG tablet TAKE 1/2 TABLET BY MOUTH ONCE DAILY 02/07/20   Chrismon, Vickki Muff, PA-C  amiodarone (PACERONE) 200 MG tablet TAKE 1 TABLET BY MOUTH ONCE DAILY 10/01/20   Chrismon, Simona Huh E, PA-C  amLODipine (NORVASC) 5 MG tablet TAKE 1 TABLET BY MOUTH ONCE DAILY 10/01/20   Chrismon, Vickki Muff, PA-C  Ensure (ENSURE) Take 237 mLs by mouth 2 (two) times daily between meals. 03/14/19   Chrismon, Vickki Muff, PA-C  ferrous sulfate 325 (65 FE) MG tablet Take 1 tablet (325 mg total) by mouth daily with breakfast. 02/03/20   Chrismon, Simona Huh  E, PA-C  levothyroxine (SYNTHROID) 100 MCG tablet Take 1 tablet (100 mcg total) by mouth daily. 10/23/20   Chrismon, Vickki Muff, PA-C  Melatonin 5 MG TABS Take 1 tablet (5 mg total) by mouth at bedtime. 04/02/18   Loletha Grayer, MD  meloxicam (MOBIC) 7.5 MG tablet TAKE 1 TABLET BY MOUTH ONCE DAILY 09/16/20   Edrick Kins, DPM  Multiple Vitamins-Iron (MULTIVITAMINS WITH IRON) TABS tablet Take 1 tablet by mouth daily. 01/04/19   Chrismon, Vickki Muff, PA-C  pantoprazole (PROTONIX) 40 MG tablet Take 1 tablet (40 mg total) by mouth daily. 01/05/20   Virginia Crews, MD  traZODone (DESYREL) 50 MG tablet TAKE 1 TABLET BY MOUTH AT BEDTIME 10/01/20   Chrismon, Vickki Muff, PA-C  triamcinolone (KENALOG) 0.025 % cream TRIAMCINOLONE ACETONIDE, 0.025% (External Cream)  1 (one) Cream Cream apply daily as needed for 0 days  Quantity: 60;  Refills: 0   Ordered :13-Apr-2014  [name] ;  Started 13-Apr-2014 Active Comments: Medication taken as needed.  04/13/14   [provider]    Allergies Codeine and Cortizone-10  [hydrocortisone]  Family History  Problem Relation Age of Onset  . Alzheimer's disease Father   . Healthy Sister     Social History Social History   Tobacco Use  . Smoking status: Former Smoker    Packs/day: 2.00    Years: 25.00    Pack years: 50.00    Quit date: 12/28/1965    Years since quitting: 55.0  . Smokeless tobacco: Never Used  Vaping Use  . Vaping Use: Never used  Substance Use Topics  . Alcohol use: No    Comment: 1 beer occasionally  . Drug use: No    Review of Systems  Constitutional: No fever/chills Eyes: No visual changes. ENT: No sore throat. Cardiovascular: Denies chest pain. Respiratory: Denies shortness of breath. Gastrointestinal: No abdominal pain.  No nausea, no vomiting. No constipation. Positive for hematochezia Genitourinary: Negative for dysuria. Musculoskeletal: Negative for back pain. Skin: Negative for rash. Neurological: Negative for headaches, focal weakness or numbness.  Positive for transient episode of unresponsiveness   ____________________________________________   PHYSICAL EXAM:  VITAL SIGNS: Vitals:   01/03/21 1145  BP: 135/71  Pulse: 66  Resp: 18  Temp: 97.8 F (36.6 C)  SpO2: 100%     Constitutional: Alert  and oriented.  Chronically ill-appearing without distress.  Follows commands in all 4 extremities. Eyes: Conjunctivae are normal. PERRL. EOMI. Head: Atraumatic. Nose: No congestion/rhinnorhea. Mouth/Throat: Mucous membranes are moist.  Oropharynx non-erythematous. Neck: No stridor. No cervical spine tenderness to palpation. Cardiovascular: Normal rate, regular rhythm. Grossly normal heart sounds.  Good peripheral circulation. Respiratory: Normal respiratory effort.  No retractions. Lungs CTAB. Gastrointestinal: Soft , nondistended, nontender to palpation. No CVA tenderness. Pants and sheets covered with bright red blood.  No pooling.  No active bleeding identified. Unable to perform DRE due to patient being in the hallway. Musculoskeletal: No lower extremity tenderness nor edema.  No joint effusions. No signs of acute trauma. Neurologic:  Normal speech and language. No gross focal neurologic deficits are appreciated.  Cranial nerves II through XII intact 5/5 strength and sensation in all 4 extremities Skin:  Skin is warm, dry and intact. No rash noted. Psychiatric: Mood and affect are normal. Speech and behavior are normal.  ____________________________________________   LABS (all labs ordered are listed, but only abnormal results are displayed)  Labs Reviewed  CBC - Abnormal; Notable for the following components:  Result Value   WBC 10.8 (*)    RBC 2.40 (*)    Hemoglobin 7.3 (*)    HCT 23.3 (*)    Platelets 88 (*)    All other components within normal limits  DIFFERENTIAL - Abnormal; Notable for the following components:   Neutro Abs 9.0 (*)    All other components within normal limits  COMPREHENSIVE METABOLIC PANEL - Abnormal; Notable for the following components:   CO2 20 (*)    Glucose, Bld 133 (*)    BUN 44 (*)    Creatinine, Ser 3.45 (*)    Calcium 7.7 (*)    Total Protein 6.0 (*)    Albumin 2.9 (*)    GFR, Estimated 16 (*)    All other components within normal  limits  CBG MONITORING, ED - Abnormal; Notable for the following components:   Glucose-Capillary 109 (*)    All other components within normal limits  CBG MONITORING, ED - Abnormal; Notable for the following components:   Glucose-Capillary 125 (*)    All other components within normal limits  RESP PANEL BY RT-PCR (FLU A&B, COVID) ARPGX2  PROTIME-INR  APTT  TYPE AND SCREEN   ____________________________________________  12 Lead EKG  Sinus rhythm, rate of 57 bpm.  Normal axis and intervals.  No evidence of acute ischemia. ____________________________________________  RADIOLOGY  ED MD interpretation: CT head reviewed by me without evidence of acute intracranial pathology.  Official radiology report(s): CT HEAD CODE STROKE WO CONTRAST  Result Date: 01/03/2021 CLINICAL DATA:  Code stroke.  Aphasia. EXAM: CT HEAD WITHOUT CONTRAST TECHNIQUE: Contiguous axial images were obtained from the base of the skull through the vertex without intravenous contrast. COMPARISON:  11/23/2017 FINDINGS: Brain: No evidence of acute infarction, hemorrhage, hydrocephalus, extra-axial collection or mass lesion/mass effect. Chronic white matter disease superimposed on extensive dilated perivascular spaces. Cerebral volume loss which is mild for age. Vascular: No hyperdense vessel or unexpected calcification. Skull: Negative Sinuses/Orbits: Negative Other: These results were called by telephone at the time of interpretation on 01/03/2021 at 10:34 am to provider [facility] , who verbally acknowledged these results. ASPECTS [facility] Stroke Program Early CT Score) - Ganglionic level infarction (caudate, lentiform nuclei, internal capsule, insula, M1-M3 cortex): 7 - Supraganglionic infarction (M4-M6 cortex): 3 Total score (0-10 with 10 being normal): 10 IMPRESSION: 1. No acute finding. 2. Chronic small vessel disease superimposed on extensive dilated perivascular spaces. Electronically Signed   By: Monte Fantasia M.D.   On:  01/03/2021 10:36    ____________________________________________   PROCEDURES and INTERVENTIONS  Procedure(s) performed (including Critical Care):  .1-3 Lead EKG Interpretation Performed by: Vladimir Crofts, MD Authorized by: Vladimir Crofts, MD     Interpretation: normal     ECG rate:  60   ECG rate assessment: normal     Rhythm: sinus rhythm     Ectopy: none     Conduction: normal   .Critical Care Performed by: Vladimir Crofts, MD Authorized by: Vladimir Crofts, MD   Critical care provider statement:    Critical care time (minutes):  45   Critical care was necessary to treat or prevent imminent or life-threatening deterioration of the following conditions:  Circulatory failure and CNS failure or compromise   Critical care was time spent personally by me on the following activities:  Discussions with consultants, evaluation of patient's response to treatment, examination of patient, ordering and performing treatments and interventions, ordering and review of laboratory studies, ordering and review of radiographic studies, pulse oximetry,  re-evaluation of patient's condition, obtaining history from patient or surrogate and review of old charts    Medications  sodium chloride flush (NS) 0.9 % injection 3 mL (3 mLs Intravenous Given 01/03/21 1041)    ____________________________________________   MDM / ED COURSE   87 year old male with history of lower GI bleeding, not on AC, presents to the ED after an unresponsive episode in the setting of acute lower GI bleeding requiring medical admission.  Normal vitals on room air.  Exam demonstrates a cachectic patient without evidence of neurologic deficits or distress.  Benign abdomen.  No evidence of upper GI bleeding.  Blood work with new normocytic anemia with three-point hemoglobin drop, likely due to his lower GI bleeding.  There is hemodynamic stability, no indications for emergent transfusions in the ED at this time, but patient is agreeable  if the indication were to develop.  CT head and neurology consultation without evidence of acute CVA.  Will admit to hospitalist medicine with GI consulting for his lower GI bleeding.  Clinical Course as of 01/03/21 1214  Thu Jan 03, 2021  1018 I see patient in wheelchair on the way to CT. Nonverbal, no distress [DS]  1027 Neuro in the ED. No bleed or signs of CVA on noncon CT head [DS]  1054 Neuro cancels code stroke [DS]  1110 Son at the bedside provides majority of history. [DS]  1200 Educated patient and son on drop in hemoglobin, need for admission.  They are agreeable to blood transfusion as needed. [DS]  1208 Dr. Allen Norris, plan to see at about 4pm , after he returns from clinic.  Nuclear scan to help localize bleeding [DS]    Clinical Course User Index [DS] Vladimir Crofts, MD    ____________________________________________   FINAL CLINICAL IMPRESSION(S) / ED DIAGNOSES  Final diagnoses:  Lower GI bleed  Normocytic anemia  Confusion     ED Discharge Orders    None       Terrye Dombrosky Tamala Julian   Note:  This document was prepared using Dragon voice recognition software and may include unintentional dictation errors.   Vladimir Crofts, MD 01/03/21 [phone]

## 2021-01-03 NOTE — ED Notes (Signed)
Pt to be transported by [employer] at this time

## 2021-01-03 NOTE — ED Notes (Signed)
Pt given ice water at this time.  

## 2021-01-03 NOTE — Progress Notes (Deleted)
Acute Office Visit  Subjective:    Patient ID: Glenn Jarvis., male    DOB: February 03, 1927, 87 y.o.   MRN: [id number]  No chief complaint on file.   HPI Patient is in today for evaluation of blood in his stool.  The patient came tot he office yesterday and had his Covid Booster.  Patient's daughter believes that the vaccine is what caused her father to have blood in the stool.  It was reported that they called 911 and responders advised that the patient see his PCP.   Past Medical History:  Diagnosis Date  . Arthritis   . GERD (gastroesophageal reflux disease)   . Hemorrhoid 1986  . Hyperlipidemia   . Hypertension   . SVT (supraventricular tachycardia) ([place])   . Thyroid disease   . Vertigo     Past Surgical History:  Procedure Laterality Date  . APPENDECTOMY  1968  . BACK SURGERY    . COLONOSCOPY N/A 11/27/2017   Procedure: COLONOSCOPY;  Surgeon: Toledo, Benay Pike, MD;  Location: [facility] ENDOSCOPY;  Service: Gastroenterology;  Laterality: N/A;  . ESOPHAGOGASTRODUODENOSCOPY (EGD) WITH PROPOFOL N/A 11/25/2017   Procedure: ESOPHAGOGASTRODUODENOSCOPY (EGD) WITH PROPOFOL;  Surgeon: Toledo, Benay Pike, MD;  Location: [facility] ENDOSCOPY;  Service: Gastroenterology;  Laterality: N/A;  . EXCISIONAL HEMORRHOIDECTOMY    . INGUINAL HERNIA REPAIR Right 03/17/2016   Procedure: LAPAROSCOPIC INGUINAL HERNIA;  Surgeon: Florene Glen, MD;  Location: [facility] ORS;  Service: General;  Laterality: Right;  . SPINE SURGERY  1984    Family History  Problem Relation Age of Onset  . Alzheimer's disease Father   . Healthy Sister     Social History   Socioeconomic History  . Marital status: Widowed    Spouse name: Not on file  . Number of children: Not on file  . Years of education: Not on file  . Highest education level: Not on file  Occupational History  . Not on file  Tobacco Use  . Smoking status: Former Smoker    Packs/day: 2.00    Years: 25.00    Pack years: 50.00    Quit date:  12/28/1965    Years since quitting: 55.0  . Smokeless tobacco: Never Used  Vaping Use  . Vaping Use: Never used  Substance and Sexual Activity  . Alcohol use: No    Comment: 1 beer occasionally  . Drug use: No  . Sexual activity: Not Currently  Other Topics Concern  . Not on file  Social History Narrative  . Not on file   Social Determinants of Health   Financial Resource Strain: Not on file  Food Insecurity: Not on file  Transportation Needs: Not on file  Physical Activity: Not on file  Stress: Not on file  Social Connections: Not on file  Intimate Partner Violence: Not on file    Outpatient Medications Prior to Visit  Medication Sig Dispense Refill  . allopurinol (ZYLOPRIM) 100 MG tablet TAKE 1/2 TABLET BY MOUTH ONCE DAILY 30 tablet 3  . amiodarone (PACERONE) 200 MG tablet TAKE 1 TABLET BY MOUTH ONCE DAILY 90 tablet 1  . amLODipine (NORVASC) 5 MG tablet TAKE 1 TABLET BY MOUTH ONCE DAILY 90 tablet 0  . Ensure (ENSURE) Take 237 mLs by mouth 2 (two) times daily between meals. 14220 mL 3  . ferrous sulfate 325 (65 FE) MG tablet Take 1 tablet (325 mg total) by mouth daily with breakfast. 30 tablet 3  . levothyroxine (SYNTHROID) 100 MCG tablet Take 1  tablet (100 mcg total) by mouth daily. 90 tablet 0  . Melatonin 5 MG TABS Take 1 tablet (5 mg total) by mouth at bedtime. 30 tablet 0  . meloxicam (MOBIC) 7.5 MG tablet TAKE 1 TABLET BY MOUTH ONCE DAILY 60 tablet 2  . Multiple Vitamins-Iron (MULTIVITAMINS WITH IRON) TABS tablet Take 1 tablet by mouth daily. 90 tablet 1  . pantoprazole (PROTONIX) 40 MG tablet Take 1 tablet (40 mg total) by mouth daily. 90 tablet 3  . traZODone (DESYREL) 50 MG tablet TAKE 1 TABLET BY MOUTH AT BEDTIME 90 tablet 0  . triamcinolone (KENALOG) 0.025 % cream TRIAMCINOLONE ACETONIDE, 0.025% (External Cream)  1 (one) Cream Cream apply daily as needed for 0 days  Quantity: 60;  Refills: 0   Ordered :13-Apr-2014  [name] ;  Started  13-Apr-2014 Active Comments: Medication taken as needed.      No facility-administered medications prior to visit.    Allergies  Allergen Reactions  . Codeine   . Cortizone-10  [Hydrocortisone]     Review of Systems     Objective:    Physical Exam  There were no vitals taken for this visit. Wt Readings from Last 3 Encounters:  01/03/21 138 lb 14.2 oz (63 kg)  10/15/20 139 lb (63 kg)  02/02/20 142 lb (64.4 kg)    There are no preventive care reminders to display for this patient.  There are no preventive care reminders to display for this patient.   Lab Results  Component Value Date   TSH 98.100 (H) 10/15/2020   Lab Results  Component Value Date   WBC 7.3 10/15/2020   HGB 10.1 (L) 10/15/2020   HCT 31.7 (L) 10/15/2020   MCV 93 10/15/2020   PLT 109 (L) 10/15/2020   Lab Results  Component Value Date   NA 139 10/15/2020   K 5.1 10/15/2020   CO2 22 10/15/2020   GLUCOSE 89 10/15/2020   BUN 41 (H) 10/15/2020   CREATININE 3.21 (H) 10/15/2020   BILITOT <0.2 10/15/2020   ALKPHOS 93 10/15/2020   AST 22 10/15/2020   ALT 11 10/15/2020   PROT 6.6 10/15/2020   ALBUMIN 3.7 10/15/2020   CALCIUM 8.1 (L) 10/15/2020   ANIONGAP 7 04/02/2018   Lab Results  Component Value Date   CHOL 176 10/15/2020   Lab Results  Component Value Date   HDL 59 10/15/2020   Lab Results  Component Value Date   LDLCALC 107 (H) 10/15/2020   Lab Results  Component Value Date   TRIG 50 10/15/2020   Lab Results  Component Value Date   CHOLHDL 3.2 04/02/2017   No results found for: HGBA1C     Assessment & Plan:   Problem List Items Addressed This Visit   None      No orders of the defined types were placed in this encounter.    Juluis Mire, CMA

## 2021-01-03 NOTE — ED Notes (Signed)
Called lab to add on reticulocytes at this time. Per lab tech, will add on to previously sent lab work

## 2021-01-03 NOTE — Consult Note (Signed)
Lucilla Lame, MD [facility]  [address] [place], [place] [address] Phone: [phone] Fax : [phone]  Consultation  Referring Provider:     Dr. Tamala Julian Primary Care Physician:  Margo Common, PA-C Primary Gastroenterologist:  Dr. Alice Reichert         Reason for Consultation:     Lower GI bleed  Date of Admission:  01/03/2021 Date of Consultation:  01/03/2021         HPI:   Glenn Jarvis. is a 87 y.o. male who has a history of lower GI bleed in the past and had a colonoscopy by Dr. Alice Reichert in 2018.  At that time the patient was found to have AVMs that were treated with coagulation and diverticulosis.  The patient states that he started to have rectal bleeding so he came to the emergency room.  There was a report of altered mental status and he was evaluated by neurology.  The patient has a history of SVT hypertension hypothyroidism GERD, chronic kidney disease and gout.  The patient son is with the patient at the bedside in the ED today.  The patient states that he started to have bright red blood per rectum the day prior to coming to the ER.  He states that his last bowel movement this morning was brown without any sign of bleeding.  He denies any abdominal pain change in bowel habits constipation or diarrhea.  The patient denies being on any blood thinners.  The patient also denies any unexplained weight loss recently.  Has been relatively well from a GI point of view since his last GI bleed back in 2018.  Past Medical History:  Diagnosis Date  . Arthritis   . GERD (gastroesophageal reflux disease)   . Hemorrhoid 1986  . Hyperlipidemia   . Hypertension   . SVT (supraventricular tachycardia) ([place])   . Thyroid disease   . Vertigo     Past Surgical History:  Procedure Laterality Date  . APPENDECTOMY  1968  . BACK SURGERY    . COLONOSCOPY N/A 11/27/2017   Procedure: COLONOSCOPY;  Surgeon: Toledo, Benay Pike, MD;  Location: [facility] ENDOSCOPY;  Service: Gastroenterology;   Laterality: N/A;  . ESOPHAGOGASTRODUODENOSCOPY (EGD) WITH PROPOFOL N/A 11/25/2017   Procedure: ESOPHAGOGASTRODUODENOSCOPY (EGD) WITH PROPOFOL;  Surgeon: Toledo, Benay Pike, MD;  Location: [facility] ENDOSCOPY;  Service: Gastroenterology;  Laterality: N/A;  . EXCISIONAL HEMORRHOIDECTOMY    . INGUINAL HERNIA REPAIR Right 03/17/2016   Procedure: LAPAROSCOPIC INGUINAL HERNIA;  Surgeon: Florene Glen, MD;  Location: [facility] ORS;  Service: General;  Laterality: Right;  . [place]    Prior to Admission medications   Medication Sig Start Date End Date Taking? Authorizing Provider  allopurinol (ZYLOPRIM) 100 MG tablet TAKE 1/2 TABLET BY MOUTH ONCE DAILY 02/07/20   Chrismon, Vickki Muff, PA-C  amiodarone (PACERONE) 200 MG tablet TAKE 1 TABLET BY MOUTH ONCE DAILY 10/01/20   Chrismon, Simona Huh E, PA-C  amLODipine (NORVASC) 5 MG tablet TAKE 1 TABLET BY MOUTH ONCE DAILY 10/01/20   Chrismon, Vickki Muff, PA-C  Ensure (ENSURE) Take 237 mLs by mouth 2 (two) times daily between meals. 03/14/19   Chrismon, Vickki Muff, PA-C  ferrous sulfate 325 (65 FE) MG tablet Take 1 tablet (325 mg total) by mouth daily with breakfast. 02/03/20   Chrismon, Vickki Muff, PA-C  levothyroxine (SYNTHROID) 100 MCG tablet Take 1 tablet (100 mcg total) by mouth daily. 10/23/20   Chrismon, Vickki Muff, PA-C  Melatonin 5  MG TABS Take 1 tablet (5 mg total) by mouth at bedtime. 04/02/18   Loletha Grayer, MD  meloxicam (MOBIC) 7.5 MG tablet TAKE 1 TABLET BY MOUTH ONCE DAILY 09/16/20   Edrick Kins, DPM  Multiple Vitamins-Iron (MULTIVITAMINS WITH IRON) TABS tablet Take 1 tablet by mouth daily. 01/04/19   Chrismon, Vickki Muff, PA-C  pantoprazole (PROTONIX) 40 MG tablet Take 1 tablet (40 mg total) by mouth daily. 01/05/20   Virginia Crews, MD  traZODone (DESYREL) 50 MG tablet TAKE 1 TABLET BY MOUTH AT BEDTIME 10/01/20   Chrismon, Vickki Muff, PA-C  triamcinolone (KENALOG) 0.025 % cream TRIAMCINOLONE ACETONIDE, 0.025% (External Cream)  1 (one) Cream Cream apply daily  as needed for 0 days  Quantity: 60;  Refills: 0   Ordered :13-Apr-2014  [name] ;  Started 13-Apr-2014 Active Comments: Medication taken as needed.  04/13/14   [provider]    Family History  Problem Relation Age of Onset  . Alzheimer's disease Father   . Healthy Sister      Social History   Tobacco Use  . Smoking status: Former Smoker    Packs/day: 2.00    Years: 25.00    Pack years: 50.00    Quit date: 12/28/1965    Years since quitting: 55.0  . Smokeless tobacco: Never Used  Vaping Use  . Vaping Use: Never used  Substance Use Topics  . Alcohol use: No    Comment: 1 beer occasionally  . Drug use: No    Allergies as of 01/03/2021 - Review Complete 01/03/2021  Allergen Reaction Noted  . Codeine  07/05/2015  . Cortizone-10  [hydrocortisone]  07/05/2015    Review of Systems:    All systems reviewed and negative except where noted in HPI.   Physical Exam:  Vital signs in last 24 hours: Temp:  [97.8 F (36.6 C)] 97.8 F (36.6 C) (01/06 1145) Pulse Rate:  [58-66] 58 (01/06 1216) Resp:  [18] 18 (01/06 1216) BP: (135-142)/(67-71) 142/67 (01/06 1216) SpO2:  [98 %-100 %] 98 % (01/06 1216) Weight:  [63 kg] 63 kg (01/06 1105)   General:   Pleasant, cooperative in NAD Head:  Normocephalic and atraumatic. Eyes:   No icterus.   Conjunctiva pink. PERRLA. Ears:  Normal auditory acuity. Neck:  Supple; no masses or thyroidomegaly Lungs: Respirations even and unlabored. Lungs clear to auscultation bilaterally.   No wheezes, crackles, or rhonchi.  Heart:  Regular rate and rhythm;  Without murmur, clicks, rubs or gallops Abdomen:  Soft, nondistended, nontender. Normal bowel sounds. No appreciable masses or hepatomegaly.  No rebound or guarding.  Rectal:  Not performed. Msk:  Symmetrical without gross deformities.   Extremities:  Without edema, cyanosis or clubbing. Neurologic:  Alert and oriented x3;  grossly normal neurologically.  Patient is hard of  hearing Skin:  Intact without significant lesions or rashes. Cervical Nodes:  No significant cervical adenopathy. Psych:  Alert and cooperative. Normal affect.  LAB RESULTS: Recent Labs    01/03/21 1039  WBC 10.8*  HGB 7.3*  HCT 23.3*  PLT 88*   BMET Recent Labs    01/03/21 1039  NA 139  K 4.8  CL 109  CO2 20*  GLUCOSE 133*  BUN 44*  CREATININE 3.45*  CALCIUM 7.7*   LFT Recent Labs    01/03/21 1039  PROT 6.0*  ALBUMIN 2.9*  AST 20  ALT 11  ALKPHOS 53  BILITOT 0.6   PT/INR Recent Labs    01/03/21 1039  LABPROT 14.5  INR 1.2    STUDIES: CT HEAD CODE STROKE WO CONTRAST  Result Date: 01/03/2021 CLINICAL DATA:  Code stroke.  Aphasia. EXAM: CT HEAD WITHOUT CONTRAST TECHNIQUE: Contiguous axial images were obtained from the base of the skull through the vertex without intravenous contrast. COMPARISON:  11/23/2017 FINDINGS: Brain: No evidence of acute infarction, hemorrhage, hydrocephalus, extra-axial collection or mass lesion/mass effect. Chronic white matter disease superimposed on extensive dilated perivascular spaces. Cerebral volume loss which is mild for age. Vascular: No hyperdense vessel or unexpected calcification. Skull: Negative Sinuses/Orbits: Negative Other: These results were called by telephone at the time of interpretation on 01/03/2021 at 10:34 am to provider [facility] , who verbally acknowledged these results. ASPECTS [facility] Stroke Program Early CT Score) - Ganglionic level infarction (caudate, lentiform nuclei, internal capsule, insula, M1-M3 cortex): 7 - Supraganglionic infarction (M4-M6 cortex): 3 Total score (0-10 with 10 being normal): 10 IMPRESSION: 1. No acute finding. 2. Chronic small vessel disease superimposed on extensive dilated perivascular spaces. Electronically Signed   By: Monte Fantasia M.D.   On: 01/03/2021 10:36      Impression / Plan:   Assessment: Active Problems:   * No active hospital problems. *   Glenn Tomes Sr. is a  87 y.o. y/o male with who comes in with rectal bleeding prior to coming to the ER.  He reports that he had a brown stool earlier this morning.  The patient has a history of AVMs and diverticular disease.  He is not having any abdominal pain or change in bowel habits.  The patient is likely having bleeding from his diverticulosis versus recurrent AVMs.  Plan:  I discussed the options in depth with the patient and his son including Full observation or undergoing a colonoscopy.  The patient's hemoglobin 2 months ago was 10.2 which is around his baseline and this morning was 7.3.  The patient has elected to undergo a colonoscopy.  The patient will be prepped today for a colonoscopy to be done tomorrow.  The patient and his son have been explained the plan and agree with it.  Thank you for involving me in the care of this patient.      LOS: 0 days   Lucilla Lame, MD, [facility] 01/03/2021, 12:52 PM,  Pager [phone] 7am-5pm  Check AMION for 5pm -7am coverage and on weekends   Note: This dictation was prepared with Dragon dictation along with smaller phrase technology. Any transcriptional errors that result from this process are unintentional.

## 2021-01-03 NOTE — ED Notes (Signed)
Pt A&Ox4 at this time. Pt given warm blanket at this time.

## 2021-01-03 NOTE — ED Notes (Addendum)
Neurology MD Theda Sers, Stroke RN and EDP at bedside

## 2021-01-03 NOTE — ED Triage Notes (Signed)
Pt comes into the ED via EMS from home, states he was sitting on the bedside commode and suddenly stopped talking, pt was following commands but unable to talk, lasting approximately 61mins and in route he began to speak and speech is normal now. SB on the monitor 58, 193/84,  Bright red blood with stools. No previous hx of seizures. CBG 177, 100%RA

## 2021-01-03 NOTE — Progress Notes (Signed)
CODE STROKE- PHARMACY COMMUNICATION Responded in person. Evaluation per MD decided against tPA and Code stroke canceled _0 .  Time CODE STROKE called/page received:1019  Time response to CODE STROKE was made (in person or via phone): 1023  Time Stroke Kit retrieved from [employer] (only if needed):n/a  Name of Provider/Nurse contacted: Minette Brine RN  Past Medical History:  Diagnosis Date  . Arthritis   . GERD (gastroesophageal reflux disease)   . Hemorrhoid 1986  . Hyperlipidemia   . Hypertension   . SVT (supraventricular tachycardia) ([place])   . Thyroid disease   . Vertigo    Prior to Admission medications   Medication Sig Start Date End Date Taking? Authorizing Provider  allopurinol (ZYLOPRIM) 100 MG tablet TAKE 1/2 TABLET BY MOUTH ONCE DAILY 02/07/20   Chrismon, Vickki Muff, PA-C  amiodarone (PACERONE) 200 MG tablet TAKE 1 TABLET BY MOUTH ONCE DAILY 10/01/20   Chrismon, Simona Huh E, PA-C  amLODipine (NORVASC) 5 MG tablet TAKE 1 TABLET BY MOUTH ONCE DAILY 10/01/20   Chrismon, Vickki Muff, PA-C  Ensure (ENSURE) Take 237 mLs by mouth 2 (two) times daily between meals. 03/14/19   Chrismon, Vickki Muff, PA-C  ferrous sulfate 325 (65 FE) MG tablet Take 1 tablet (325 mg total) by mouth daily with breakfast. 02/03/20   Chrismon, Vickki Muff, PA-C  levothyroxine (SYNTHROID) 100 MCG tablet Take 1 tablet (100 mcg total) by mouth daily. 10/23/20   Chrismon, Vickki Muff, PA-C  Melatonin 5 MG TABS Take 1 tablet (5 mg total) by mouth at bedtime. 04/02/18   Loletha Grayer, MD  meloxicam (MOBIC) 7.5 MG tablet TAKE 1 TABLET BY MOUTH ONCE DAILY 09/16/20   Edrick Kins, DPM  Multiple Vitamins-Iron (MULTIVITAMINS WITH IRON) TABS tablet Take 1 tablet by mouth daily. 01/04/19   Chrismon, Vickki Muff, PA-C  pantoprazole (PROTONIX) 40 MG tablet Take 1 tablet (40 mg total) by mouth daily. 01/05/20   Virginia Crews, MD  traZODone (DESYREL) 50 MG tablet TAKE 1 TABLET BY MOUTH AT BEDTIME 10/01/20   Chrismon, Vickki Muff, PA-C  triamcinolone  (KENALOG) 0.025 % cream TRIAMCINOLONE ACETONIDE, 0.025% (External Cream)  1 (one) Cream Cream apply daily as needed for 0 days  Quantity: 60;  Refills: 0   Ordered :13-Apr-2014  [name] ;  Started 13-Apr-2014 Active Comments: Medication taken as needed.  04/13/14   [provider]    Lorna Dibble ,PharmD Clinical Pharmacist  01/03/2021  12:37 PM

## 2021-01-03 NOTE — Consult Note (Signed)
Neurology Consult H&P  CC: code stroke  History is obtained from: Staff and patient.  HPI: Glenn Morozov Sr. is a 87 y.o. male PMHx as reviewed below brought in from home my EMS tated he was sitting on bedside commode and stopped talking however was following commands consistently. Lasted approximately 10 mins and in route speech was normal. Patient with blood on his arms, blankets, pajama bottoms and reported bright red blood per rectum since last night.  Denies headache, visual/hearing changes, N/V/F/CP  No previous hx of seizures.  LKW: unclear tpa given?: No, NIHSS0/active GI bleed. IR Thrombectomy? No, Modified Rankin Scale: 0-Completely asymptomatic and back to baseline post- stroke NIHSS: 0  ROS: A complete ROS was performed and is negative except as noted in the HPI.   Past Medical History:  Diagnosis Date  . Arthritis   . GERD (gastroesophageal reflux disease)   . Hemorrhoid 1986  . Hyperlipidemia   . Hypertension   . SVT (supraventricular tachycardia) ([place])   . Thyroid disease   . Vertigo    Family History  Problem Relation Age of Onset  . Alzheimer's disease Father   . Healthy Sister     Social History:  reports that he quit smoking about 55 years ago. He has a 50.00 pack-year smoking history. He has never used smokeless tobacco. He reports that he does not drink alcohol and does not use drugs.   Prior to Admission medications   Medication Sig Start Date End Date Taking? Authorizing Provider  allopurinol (ZYLOPRIM) 100 MG tablet TAKE 1/2 TABLET BY MOUTH ONCE DAILY 02/07/20   Chrismon, Vickki Muff, PA-C  amiodarone (PACERONE) 200 MG tablet TAKE 1 TABLET BY MOUTH ONCE DAILY 10/01/20   Chrismon, Simona Huh E, PA-C  amLODipine (NORVASC) 5 MG tablet TAKE 1 TABLET BY MOUTH ONCE DAILY 10/01/20   Chrismon, Vickki Muff, PA-C  Ensure (ENSURE) Take 237 mLs by mouth 2 (two) times daily between meals. 03/14/19   Chrismon, Vickki Muff, PA-C  ferrous sulfate 325 (65 FE) MG tablet Take 1  tablet (325 mg total) by mouth daily with breakfast. 02/03/20   Chrismon, Vickki Muff, PA-C  levothyroxine (SYNTHROID) 100 MCG tablet Take 1 tablet (100 mcg total) by mouth daily. 10/23/20   Chrismon, Vickki Muff, PA-C  Melatonin 5 MG TABS Take 1 tablet (5 mg total) by mouth at bedtime. 04/02/18   Loletha Grayer, MD  meloxicam (MOBIC) 7.5 MG tablet TAKE 1 TABLET BY MOUTH ONCE DAILY 09/16/20   Edrick Kins, DPM  Multiple Vitamins-Iron (MULTIVITAMINS WITH IRON) TABS tablet Take 1 tablet by mouth daily. 01/04/19   Chrismon, Vickki Muff, PA-C  pantoprazole (PROTONIX) 40 MG tablet Take 1 tablet (40 mg total) by mouth daily. 01/05/20   Virginia Crews, MD  traZODone (DESYREL) 50 MG tablet TAKE 1 TABLET BY MOUTH AT BEDTIME 10/01/20   Chrismon, Vickki Muff, PA-C  triamcinolone (KENALOG) 0.025 % cream TRIAMCINOLONE ACETONIDE, 0.025% (External Cream)  1 (one) Cream Cream apply daily as needed for 0 days  Quantity: 60;  Refills: 0   Ordered :13-Apr-2014  [name] ;  Started 13-Apr-2014 Active Comments: Medication taken as needed.  04/13/14   [provider]    Exam: Current vital signs: Ht 6' (1.829 m)   Wt 63 kg   BMI 18.84 kg/m   Physical Exam  Constitutional: Appears well-developed and well-nourished.  Psych: Affect appropriate to situation Eyes: No scleral injection HENT: No OP obstrucion Head: Normocephalic.  Cardiovascular: Normal rate and regular rhythm.  Respiratory: Effort normal and breath sounds normal to anterior ascultation GI: Soft.  No distension. There is no tenderness.  Skin: WDI  Neuro: Mental Status: Patient is awake, alert, oriented to person, place, month, year, and situation. Patient is able to give a clear and coherent history. No signs of aphasia or neglect. Cranial Nerves: II: Visual Fields are full. Pupils are equal, round, and reactive to light. III,IV, VI: EOMI without ptosis or diploplia.  V: Facial sensation is symmetric to temperature VII: Facial  movement is symmetric.  VIII: hearing is intact to voice X: Uvula elevates symmetrically XI: Shoulder shrug is symmetric. XII: tongue is midline without atrophy or fasciculations.  Motor: Tone is normal. Bulk is normal. 5/5 strength was present in all four extremities. Sensory: Sensation is symmetric to light touch and temperature in the arms and legs. Deep Tendon Reflexes: 2+ and symmetric in the biceps and patellae. Plantars: Toes are downgoing bilaterally. Cerebellar: FNF and HKS are intact bilaterally.  I have reviewed labs in epic and the pertinent results are: Results for KYLER, GERMER SR. (MRN [id number]) as of 01/03/2021 11:16  Ref. Range 10/15/2020 16:37  TSH Latest Ref Range: 0.450 - 4.500 uIU/mL 98.100 (H)    Ref. Range 10/15/2020 16:37  BUN Latest Ref Range: 10 - 36 mg/dL 41 (H)  Creatinine Latest Ref Range: 0.76 - 1.27 mg/dL 3.21 (H)  Calcium Latest Ref Range: 8.6 - 10.2 mg/dL 8.1 (L)    Ref. Range 01/03/2021 10:33  Glucose-Capillary Latest Ref Range: 70 - 99 mg/dL 125 (H)    I have reviewed the images obtained: NCT head showed no acute ischemic changes  Assessment: Glenn Morrish Sr. is a 87 y.o. male PMHx HTN, HLD arrives with active GI bleed and labs suggesting metabolic derangement which may be etiology. The patient is hypophonic and symptoms are not entirely clear. Exam showed patient to be neurologically intact and code stroke cancelled.   Plan: - Recommend correcting metabolic derangements. - If indicated, metabolic/infectious workup. - May need GI consult. - Hold antiplatelets/anticoagulants. - Management of comorbid medical conditions per primary team. - May consider MRI brain without contrast and if positive please call neurology for further recommendations.  This patient is critically ill and at significant risk of neurological worsening, death and care requires constant monitoring of vital signs, hemodynamics,respiratory and cardiac monitoring,  neurological assessment, discussion with family, other specialists and medical decision making of high complexity. I spent 70 minutes of neurocritical care time  in the care of  this patient. This was time spent independent of any time provided by nurse practitioner or PA.  Electronically signed by:  Lynnae Sandhoff, MD Page: [phone] 01/03/2021, 11:09 AM

## 2021-01-04 ENCOUNTER — Inpatient Hospital Stay: Payer: Medicare PPO | Admitting: Anesthesiology

## 2021-01-04 ENCOUNTER — Encounter
Admission: EM | Disposition: A | Discharge status: Home / Self Care | Payer: Self-pay | Source: Home / Self Care | Attending: Emergency Medicine

## 2021-01-04 ENCOUNTER — Encounter: Payer: Self-pay | Admitting: Internal Medicine

## 2021-01-04 DIAGNOSIS — D649 Anemia, unspecified: Secondary | ICD-10-CM

## 2021-01-04 DIAGNOSIS — D631 Anemia in chronic kidney disease: Secondary | ICD-10-CM | POA: Diagnosis present

## 2021-01-04 DIAGNOSIS — K922 Gastrointestinal hemorrhage, unspecified: Secondary | ICD-10-CM | POA: Diagnosis not present

## 2021-01-04 HISTORY — PX: COLONOSCOPY WITH PROPOFOL: SHX5780

## 2021-01-04 LAB — CBC
HCT: 28 % — ABNORMAL LOW (ref 39.0–52.0)
Hemoglobin: 9.1 g/dL — ABNORMAL LOW (ref 13.0–17.0)
MCH: 30 pg (ref 26.0–34.0)
MCHC: 32.5 g/dL (ref 30.0–36.0)
MCV: 92.4 fL (ref 80.0–100.0)
Platelets: 92 10*3/uL — ABNORMAL LOW (ref 150–400)
RBC: 3.03 MIL/uL — ABNORMAL LOW (ref 4.22–5.81)
RDW: 14.6 % (ref 11.5–15.5)
WBC: 6.2 10*3/uL (ref 4.0–10.5)
nRBC: 0 % (ref 0.0–0.2)

## 2021-01-04 LAB — BASIC METABOLIC PANEL
Anion gap: 9 (ref 5–15)
BUN: 42 mg/dL — ABNORMAL HIGH (ref 8–23)
CO2: 23 mmol/L (ref 22–32)
Calcium: 8.3 mg/dL — ABNORMAL LOW (ref 8.9–10.3)
Chloride: 104 mmol/L (ref 98–111)
Creatinine, Ser: 3.38 mg/dL — ABNORMAL HIGH (ref 0.61–1.24)
GFR, Estimated: 16 mL/min — ABNORMAL LOW (ref >60)
Glucose, Bld: 109 mg/dL — ABNORMAL HIGH (ref 70–99)
Potassium: 4.9 mmol/L (ref 3.5–5.1)
Sodium: 136 mmol/L (ref 135–145)

## 2021-01-04 SURGERY — COLONOSCOPY WITH PROPOFOL
Anesthesia: General

## 2021-01-04 MED ORDER — PROPOFOL 10 MG/ML IV BOLUS
INTRAVENOUS | Status: DC | PRN
  Administered 2021-01-04 (×3): 20 mg via INTRAVENOUS

## 2021-01-04 MED ORDER — GLYCOPYRROLATE 0.2 MG/ML IJ SOLN
INTRAMUSCULAR | Status: DC | PRN
  Administered 2021-01-04: .2 mg via INTRAVENOUS

## 2021-01-04 MED ORDER — LIDOCAINE HCL (CARDIAC) PF 100 MG/5ML IV SOSY
PREFILLED_SYRINGE | INTRAVENOUS | Status: DC | PRN
  Administered 2021-01-04: 20 mg via INTRAVENOUS
  Administered 2021-01-04: 60 mg via INTRAVENOUS

## 2021-01-04 MED ORDER — FERROUS SULFATE 325 (65 FE) MG PO TABS: 325.0000 mg | ORAL_TABLET | ORAL | 0 refills | Status: DC

## 2021-01-04 MED ORDER — FERROUS SULFATE 325 (65 FE) MG PO TABS: 325.0000 mg | ORAL_TABLET | ORAL | 3 refills | Status: DC

## 2021-01-04 MED ORDER — PROPOFOL 500 MG/50ML IV EMUL
INTRAVENOUS | Status: DC | PRN
  Administered 2021-01-04: 135 ug/kg/min via INTRAVENOUS

## 2021-01-04 MED ORDER — AMLODIPINE BESYLATE 5 MG PO TABS
5.0000 mg | ORAL_TABLET | Freq: Every day | ORAL | Status: DC
  Administered 2021-01-04: 5 mg via ORAL
  Filled 2021-01-04: qty 1

## 2021-01-04 MED ORDER — SODIUM CHLORIDE 0.9 % IV SOLN: INTRAVENOUS | Status: DC | PRN

## 2021-01-04 NOTE — Discharge Summary (Signed)
Physician Discharge Summary  Glenn Goodlin Sr. JXB:[id number] DOB: 1927/06/01 DOA: 01/03/2021  PCP: Margo Common, PA-C  Admit date: 01/03/2021 Discharge date: 01/04/2021  Discharge disposition: Home with home health PT   Recommendations for Outpatient Follow-Up:   Follow-up with PCP in 1 week   Discharge Diagnosis:   Principal Problem:   Severe anemia Active Problems:   Lower GI bleed    Discharge Condition: Stable.  Diet recommendation:  Diet Order            Diet heart healthy/carb modified Room service appropriate? Yes; Fluid consistency: Thin  Diet effective now           Diet - low sodium heart healthy                   Code Status: Full Code     Hospital Course:   Mr. Glenn Mchaffie. 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  He felt dizzy after having a large bloody bowel movement.  Apparently, he suddenly stopped talking but he remained conscious.  Initial hemoglobin in the ED was 7.3 but it dropped to 6.9.  He was admitted to the hospital for acute lower GI bleeding complicated by acute blood loss anemia and presyncope.  He was transfused with 2 units of packed red blood cells.  He was evaluated by the gastroenterologist, Dr. Allen Norris.  He underwent colonoscopy.  Colonoscopy revealed nonbleeding colonic angiodysplastic lesions that were treated with argon plasma coagulation.  He also has nonbleeding internal hemorrhoids.  There were no further episodes of rectal bleeding.  Posttransfusion hemoglobin was 9.1.  He was evaluated by PT who recommended home health PT.  His condition has improved and he is deemed stable for discharge to home today.  Discharge plan was discussed with his daughter, Ms. Glenn Jarvis.   Medical Consultants:    [facility]   Discharge  Exam:    Vitals:   01/04/21 1448 01/04/21 1458 01/04/21 1508 01/04/21 1600  BP: (!) 117/56 135/67 (!) 152/81 (!) 142/82  Pulse: 67 65 74 76  Resp: 13 11 11 18   Temp:    98.1 F (36.7 C)  TempSrc:      SpO2: 100% 100% 100% 98%  Weight:      Height:         GEN: NAD SKIN: Warm and dry EYES: EOMI ENT: MMM CV: RRR PULM: CTA B ABD: soft, ND, NT, +BS CNS: AAO x 3, non focal EXT: No edema or tenderness   The results of significant diagnostics from this hospitalization (including imaging, microbiology, ancillary and laboratory) are listed below for reference.     Procedures and Diagnostic Studies:   CT HEAD CODE STROKE WO CONTRAST  Result Date: 01/03/2021 CLINICAL DATA:  Code stroke.  Aphasia. EXAM: CT HEAD WITHOUT CONTRAST TECHNIQUE: Contiguous axial images were obtained from the base of the skull through the vertex without intravenous contrast. COMPARISON:  11/23/2017 FINDINGS: Brain: No evidence of acute infarction, hemorrhage, hydrocephalus, extra-axial collection or mass lesion/mass effect. Chronic white matter disease superimposed on extensive dilated perivascular spaces. Cerebral volume loss which is mild for age. Vascular: No hyperdense vessel or unexpected calcification. Skull: Negative Sinuses/Orbits: Negative Other: These results were called by telephone at the time of interpretation on 01/03/2021 at 10:34 am to provider [facility] , who verbally acknowledged  these results. ASPECTS [facility] Stroke Program Early CT Score) - Ganglionic level infarction (caudate, lentiform nuclei, internal capsule, insula, M1-M3 cortex): 7 - Supraganglionic infarction (M4-M6 cortex): 3 Total score (0-10 with 10 being normal): 10 IMPRESSION: 1. No acute finding. 2. Chronic small vessel disease superimposed on extensive dilated perivascular spaces. Electronically Signed   By: Monte Fantasia M.D.   On: 01/03/2021 10:36     Labs:   Basic Metabolic Panel: Recent Labs  Lab 01/03/21 1039  01/04/21 0516  NA 139 136  K 4.8 4.9  CL 109 104  CO2 20* 23  GLUCOSE 133* 109*  BUN 44* 42*  CREATININE 3.45* 3.38*  CALCIUM 7.7* 8.3*   GFR Estimated Creatinine Clearance: 12.4 mL/min (A) (by C-G formula based on SCr of 3.38 mg/dL (H)). Liver Function Tests: Recent Labs  Lab 01/03/21 1039  AST 20  ALT 11  ALKPHOS 53  BILITOT 0.6  PROT 6.0*  ALBUMIN 2.9*   No results for input(s): LIPASE, AMYLASE in the last 168 hours. No results for input(s): AMMONIA in the last 168 hours. Coagulation profile Recent Labs  Lab 01/03/21 1039  INR 1.2    CBC: Recent Labs  Lab 01/03/21 1039 01/03/21 2000 01/04/21 0516  WBC 10.8*  --  6.2  NEUTROABS 9.0*  --   --   HGB 7.3* 6.9* 9.1*  HCT 23.3* 21.4* 28.0*  MCV 97.1  --  92.4  PLT 88*  --  92*   Cardiac Enzymes: No results for input(s): CKTOTAL, CKMB, CKMBINDEX, TROPONINI in the last 168 hours. BNP: Invalid input(s): POCBNP CBG: Recent Labs  Lab 01/03/21 1010 01/03/21 1033  GLUCAP 109* 125*   D-Dimer No results for input(s): DDIMER in the last 72 hours. Hgb A1c No results for input(s): HGBA1C in the last 72 hours. Lipid Profile No results for input(s): CHOL, HDL, LDLCALC, TRIG, CHOLHDL, LDLDIRECT in the last 72 hours. Thyroid function studies No results for input(s): TSH, T4TOTAL, T3FREE, THYROIDAB in the last 72 hours.  Invalid input(s): FREET3 Anemia work up Recent Labs    01/03/21 1039  FERRITIN 35  TIBC 239*  IRON 37*  RETICCTPCT 1.4   Microbiology Recent Results (from the past 240 hour(s))  Resp Panel by RT-PCR (Flu A&B, Covid) Nasopharyngeal Swab     Status: None   Collection Time: 01/03/21 12:19 PM   Specimen: Nasopharyngeal Swab; Nasopharyngeal(NP) swabs in vial transport medium  Result Value Ref Range Status   SARS Coronavirus 2 by RT PCR NEGATIVE NEGATIVE Final    Comment: (NOTE) SARS-CoV-2 target nucleic acids are NOT DETECTED.  The SARS-CoV-2 RNA is generally detectable in upper  respiratory specimens during the acute phase of infection. The lowest concentration of SARS-CoV-2 viral copies this assay can detect is 138 copies/mL. A negative result does not preclude SARS-Cov-2 infection and should not be used as the sole basis for treatment or other patient management decisions. A negative result may occur with  improper specimen collection/handling, submission of specimen other than nasopharyngeal swab, presence of viral mutation(s) within the areas targeted by this assay, and inadequate number of viral copies(<138 copies/mL). A negative result must be combined with clinical observations, patient history, and epidemiological information. The expected result is Negative.  Fact Sheet for Patients:  EntrepreneurPulse.com.au  Fact Sheet for Healthcare Providers:  IncredibleEmployment.be  This test is no t yet approved or cleared by the [place] FDA and  has been authorized for detection and/or diagnosis of SARS-CoV-2 by FDA under an Emergency Use Authorization (  EUA). This EUA will remain  in effect (meaning this test can be used) for the duration of the COVID-19 declaration under Section 564(b)(1) of the Act, 21 U.S.C.section 360bbb-3(b)(1), unless the authorization is terminated  or revoked sooner.       Influenza A by PCR NEGATIVE NEGATIVE Final   Influenza B by PCR NEGATIVE NEGATIVE Final    Comment: (NOTE) The Xpert Xpress SARS-CoV-2/FLU/RSV plus assay is intended as an aid in the diagnosis of influenza from Nasopharyngeal swab specimens and should not be used as a sole basis for treatment. Nasal washings and aspirates are unacceptable for Xpert Xpress SARS-CoV-2/FLU/RSV testing.  Fact Sheet for Patients: EntrepreneurPulse.com.au  Fact Sheet for Healthcare Providers: IncredibleEmployment.be  This test is not yet approved or cleared by the [place] FDA and has been  authorized for detection and/or diagnosis of SARS-CoV-2 by FDA under an Emergency Use Authorization (EUA). This EUA will remain in effect (meaning this test can be used) for the duration of the COVID-19 declaration under Section 564(b)(1) of the Act, 21 U.S.C. section 360bbb-3(b)(1), unless the authorization is terminated or revoked.  Performed at [facility], [address] [place], [place] [address]      Discharge Instructions:   Discharge Instructions    Diet - low sodium heart healthy   Complete by: As directed    Increase activity slowly   Complete by: As directed      Allergies as of 01/04/2021      Reactions   Codeine    Cortizone-10  [hydrocortisone]       Medication List    STOP taking these medications   meloxicam 7.5 MG tablet Commonly known as: MOBIC     TAKE these medications   allopurinol 100 MG tablet Commonly known as: ZYLOPRIM TAKE 1/2 TABLET BY MOUTH ONCE DAILY   amiodarone 200 MG tablet Commonly known as: PACERONE TAKE 1 TABLET BY MOUTH ONCE DAILY   amLODipine 5 MG tablet Commonly known as: NORVASC TAKE 1 TABLET BY MOUTH ONCE DAILY   ferrous sulfate 325 (65 FE) MG tablet Take 1 tablet (325 mg total) by mouth every other day. What changed: when to take this   levothyroxine 100 MCG tablet Commonly known as: SYNTHROID Take 1 tablet (100 mcg total) by mouth daily.   multivitamins with iron Tabs tablet Take 1 tablet by mouth daily.   pantoprazole 40 MG tablet Commonly known as: PROTONIX Take 1 tablet (40 mg total) by mouth daily.   traZODone 50 MG tablet Commonly known as: DESYREL TAKE 1 TABLET BY MOUTH AT BEDTIME         Time coordinating discharge: 32 minutes  Signed:  Giah Fickett  [facility] 01/04/2021, 8:13 PM   Pager on [email or web address]. If 7PM-7AM, please contact night-coverage at [email or web address]

## 2021-01-04 NOTE — Plan of Care (Signed)
Progressing toward functional goals

## 2021-01-04 NOTE — Op Note (Signed)
[facility] Gastroenterology Patient Name: Glenn Jarvis Procedure Date: 01/04/2021 2:08 PM MRN: [id number] Account #: [id number] Date of Birth: 1927/10/03 Admit Type: Inpatient Age: 87 Room: [facility] ENDO ROOM 4 Gender: Male Note Status: Finalized Procedure:             Colonoscopy Indications:           Hematochezia Providers:             Lucilla Lame MD, MD Referring MD:          Vickki Muff. Chrismon, MD (Referring MD) Medicines:             Propofol per Anesthesia Complications:         No immediate complications. Procedure:             Pre-Anesthesia Assessment:                        - Prior to the procedure, a History and Physical was                         performed, and patient medications and allergies were                         reviewed. The patient's tolerance of previous                         anesthesia was also reviewed. The risks and benefits                         of the procedure and the sedation options and risks                         were discussed with the patient. All questions were                         answered, and informed consent was obtained. Prior                         Anticoagulants: The patient has taken no previous                         anticoagulant or antiplatelet agents. ASA Grade                         Assessment: III - A patient with severe systemic                         disease. After reviewing the risks and benefits, the                         patient was deemed in satisfactory condition to                         undergo the procedure.                        After obtaining informed consent, the colonoscope was  passed under direct vision. Throughout the procedure,                         the patient's blood pressure, pulse, and oxygen                         saturations were monitored continuously. The                         Colonoscope was introduced through the anus and                          advanced to the the cecum, identified by appendiceal                         orifice and ileocecal valve. The colonoscopy was                         performed without difficulty. The patient tolerated                         the procedure well. The quality of the bowel                         preparation was excellent. Findings:      The perianal and digital rectal examinations were normal.      A single large angiodysplastic lesion without bleeding was found in the       cecum. Coagulation for tissue destruction using argon plasma at 2       liters/minute and 30 watts was successful.      A single large angiodysplastic lesion without bleeding was found in the       ascending colon. Coagulation for tissue destruction using argon plasma       at 2 liters/minute and 20 watts was successful.      Non-bleeding internal hemorrhoids were found during retroflexion. The       hemorrhoids were Grade II (internal hemorrhoids that prolapse but reduce       spontaneously). Impression:            - A single non-bleeding colonic angiodysplastic                         lesion. Treated with argon plasma coagulation (APC).                        - A single non-bleeding colonic angiodysplastic                         lesion. Treated with argon plasma coagulation (APC).                        - Non-bleeding internal hemorrhoids.                        - No specimens collected. Recommendation:        - Resume previous diet.                        - Continue present medications.                        -  Return patient to hospital ward for ongoing care. Procedure Code(s):     --- Professional ---                        [phone], Colonoscopy, flexible; with ablation of                         tumor(s), polyp(s), or other lesion(s) (includes pre-                         and post-dilation and guide wire passage, when                         performed) Diagnosis Code(s):     --- Professional ---                         K92.1, Melena (includes Hematochezia)                        K55.20, Angiodysplasia of colon without hemorrhage CPT copyright 2019 American Medical Association. All rights reserved. The codes documented in this report are preliminary and upon coder review may  be revised to meet current compliance requirements. Lucilla Lame MD, MD 01/04/2021 2:36:15 PM This report has been signed electronically. Number of Addenda: 0 Note Initiated On: 01/04/2021 2:08 PM Scope Withdrawal Time: 0 hours 9 minutes 44 seconds  Total Procedure Duration: 0 hours 14 minutes 17 seconds  Estimated Blood Loss:  Estimated blood loss: none.      [facility]

## 2021-01-04 NOTE — Anesthesia Preprocedure Evaluation (Signed)
Anesthesia Evaluation  Patient identified by MRN, date of birth, ID band Patient awake    Reviewed: Allergy & Precautions, NPO status , Patient's Chart, lab work & pertinent test results, reviewed documented beta blocker date and time   History of Anesthesia Complications Negative for: history of anesthetic complications  Airway Mallampati: II  TM Distance: >3 FB     Dental  (+) Chipped, Upper Dentures, Lower Dentures, Dental Advidsory Given   Pulmonary shortness of breath and with exertion, neg COPD, neg recent URI, former smoker,           Cardiovascular hypertension, Pt. on medications (-) angina(-) CAD, (-) Past MI, (-) Cardiac Stents and (-) CABG + dysrhythmias Supra Ventricular Tachycardia (-) Valvular Problems/Murmurs     Neuro/Psych negative neurological ROS  negative psych ROS   GI/Hepatic Neg liver ROS, GERD  Controlled,  Endo/Other  neg diabetesHypothyroidism   Renal/GU Renal InsufficiencyRenal disease     Musculoskeletal  (+) Arthritis ,   Abdominal   Peds  Hematology  (+) Blood dyscrasia, anemia ,   Anesthesia Other Findings Past Medical History: No date: Arthritis No date: GERD (gastroesophageal reflux disease) 1986: Hemorrhoid No date: Hyperlipidemia No date: Hypertension No date: Thyroid disease No date: Vertigo   Reproductive/Obstetrics negative OB ROS                             Anesthesia Physical  Anesthesia Plan  ASA: III  Anesthesia Plan: General   Post-op Pain Management:    Induction: Intravenous  PONV Risk Score and Plan: 2 and Propofol infusion and TIVA  Airway Management Planned: Nasal Cannula and Natural Airway  Additional Equipment:   Intra-op Plan:   Post-operative Plan:   Informed Consent: I have reviewed the patients History and Physical, chart, labs and discussed the procedure including the risks, benefits and alternatives for the  proposed anesthesia with the patient or authorized representative who has indicated his/her understanding and acceptance.       Plan Discussed with: CRNA  Anesthesia Plan Comments:         Anesthesia Quick Evaluation

## 2021-01-04 NOTE — TOC Progression Note (Addendum)
Transition of Care (TOC) - Progression Note    Patient Details  Name: Glenn Jarvis. MRN: [id number] Date of Birth: 08-Mar-1927  Transition of Care [facility]) CM/SW Contact  Anselm Pancoast, RN Phone Number: 01/04/2021, 6:24 PM  Clinical Narrative:    Spoke to patient via telephone and updated on Code 44. Patient very [place]. Discussed option of HHC and patient agreed stating "whatever gets me back home". Patient was very pleasant and had no preference for [facility] agencies.   Confirmed with Helene Kelp @ [facility] availability with agency. Requested order from MD. Confirmed patient is currently on RA.   RN asked about transport as family is not able to pick patient up.         Expected Discharge Plan and Services           Expected Discharge Date: 01/04/21                                     Social Determinants of Health (SDOH) Interventions    Readmission Risk Interventions No flowsheet data found.

## 2021-01-04 NOTE — Evaluation (Signed)
Physical Therapy Evaluation Patient Details Name: Glenn Glenn Jarvis Sr. MRN: [id number] DOB: [birth year] Today's Date: 01/04/2021   History of Present Illness  Pt is a 87 y/o male with  admitted to the hospital on 01/03/21 after he became dizzy following a bloody bowel, with his family reporting he stopped talking for 10 minutes but remained conscious. PMH of GI bleed 2/2 to colonic AVM (2018), CKD stage IV, HTN, SVT, hypothyroidism, vertigo.  Clinical Impression  Patient received by physical therapy in the bed.  He was pleasant and willing to participate with physical therapy this morning. Pt's son was present in the room during evaluation.  Completed supine to sit transfer with CGA, some cueing to improve seated stability with BIL UE on the EOB.  Completed sit<>stand transfer using RW, gait belt and minA for safety.  Pt demonstrated moderate instability in the standing position, requiring minA and RW to maintain safe standing position.  Completed 193ft of ambulation with minA and RW, VCS to slow pace as he tends to shuffle and get ahead of his feet.  Patient able to make the appropriate changes following verbal cues. Patient is scheduled to have a colonoscopy this afternoon and is wanting to discharge home where he lives with his dtr who is willing and able to assist with needs. Patient tolerated interventions well today with moderate increase in fatigue and SOB, SpO2: 98% following ambulation today.     Follow Up Recommendations Home health PT    Equipment Recommendations  None recommended by PT    Recommendations for Other Services       Precautions / Restrictions Precautions Precautions: None Restrictions Weight Bearing Restrictions: No      Mobility  Bed Mobility Overal bed mobility: Modified Independent             General bed mobility comments: SBA for patient safety    Transfers Overall transfer level: Needs assistance Equipment used: Standard walker              General transfer comment: Min A for patient safety and maintaining balance with transfer due to LE weakness  Ambulation/Gait Ambulation/Gait assistance: Min assist Gait Distance (Feet): 160 Feet Assistive device: Rolling walker (2 wheeled) Gait Pattern/deviations: Step-through pattern;Trunk flexed;Shuffle     General Gait Details: shuffling noted with ambulation with slight trunk flexion  Stairs            Wheelchair Mobility    Modified Rankin (Stroke Patients Only)       Balance Overall balance assessment: Needs assistance Sitting-balance support: Bilateral upper extremity supported   Sitting balance - Comments: Requires BIL UE to maintain seated EOB balance   Standing balance support: Bilateral upper extremity supported   Standing balance comment: Requires MinA, RW for BIL UE support               Pertinent Vitals/Pain Pain Assessment: No/denies pain    Home Living Family/patient expects to be discharged to:: Private residence Living Arrangements: Children (He lives with his adult daughter who is willing and able to assist with needs) Available Help at Discharge: Family;Available PRN/intermittently Type of Home: House Home Access: Stairs to enter Entrance Stairs-Rails: Can reach both Entrance Stairs-Number of Steps: 3 steps to enter front and back door with bilateral rails Home Layout: One level Home Equipment: Walker - 2 wheels;Shower seat;Cane - single point;Grab bars - tub/shower Additional Comments: Pt reports he uses his SPC for all his mobility, unless he gets tired then he uses his RW.  He has a shower chair that he uses when he needs to shower.    Prior Function Level of Independence: Independent with assistive device(s)         Comments: Pt reports he uses his [facility] for all his mobility, unless he gets tired then he uses his RW.  He has a shower chair that he uses when he needs to shower.     Hand Dominance   Dominant Hand: Right     Extremity/Trunk Assessment        Lower Extremity Assessment Lower Extremity Assessment: Generalized weakness RLE Deficits / Details: BIL LE MMT 3/5 RLE Sensation: WNL RLE Coordination: WNL LLE Sensation: WNL LLE Coordination: WNL    Cervical / Trunk Assessment Cervical / Trunk Assessment: Kyphotic  Communication   Communication: HOH  Cognition Arousal/Alertness: Awake/alert Behavior During Therapy: WFL for tasks assessed/performed Overall Cognitive Status: Within Functional Limits for tasks assessed               General Comments: Pt alert and willing to participate with PT today      General Comments General comments (skin integrity, edema, etc.): unremarkable at this time        Assessment/Plan    PT Assessment Patient needs continued PT services  PT Problem List Decreased strength;Decreased safety awareness;Decreased activity tolerance;Decreased balance       PT Treatment Interventions Therapeutic exercise;Gait training;Balance training;Stair training;Therapeutic activities;Functional mobility training    PT Goals (Current goals can be found in the Care Plan section)  Acute Rehab PT Goals Patient Stated Goal: Return home using cane to walk PT Goal Formulation: With patient/family Time For Goal Achievement: 01/04/21 Potential to Achieve Goals: Good    Frequency Min 2X/week           AM-PAC PT "6 Clicks" Mobility  Outcome Measure Help needed turning from your back to your side while in a flat bed without using bedrails?: None Help needed moving from lying on your back to sitting on the side of a flat bed without using bedrails?: A Little Help needed moving to and from a bed to a chair (including a wheelchair)?: A Little Help needed standing up from a chair using your arms (e.g., wheelchair or bedside chair)?: A Little Help needed to walk in hospital room?: A Little Help needed climbing 3-5 steps with a railing? : A Little 6 Click Score: 19     End of Session Equipment Utilized During Treatment: Gait belt   Patient left: in bed;with call bell/phone within reach;with family/visitor present;with bed alarm set Nurse Communication: Other (comment) (Left note for nurse that DTR would like to be informed of when the pt is transported to colonoscopy) PT Visit Diagnosis: Unsteadiness on feet (R26.81);Other abnormalities of gait and mobility (R26.89);Muscle weakness (generalized) (M62.81)    Time: 6226-3335 PT Time Calculation (min) (ACUTE ONLY): 29 min   Charges:   PT Evaluation $PT Eval Low Complexity: 1 Low PT Treatments $Gait Training: 8-22 mins        Duanne Guess, PT, DPT 01/04/21, 1:00 PM   Isaias Cowman 01/04/2021, 12:53 PM

## 2021-01-04 NOTE — Transfer of Care (Signed)
Immediate Anesthesia Transfer of Care Note  Patient: Glenn Korb Sr.  Procedure(s) Performed: COLONOSCOPY WITH PROPOFOL (N/A )  Patient Location: Endoscopy Unit  Anesthesia Type:General  Level of Consciousness: drowsy and patient cooperative  Airway & Oxygen Therapy: Patient Spontanous Breathing and Patient connected to face mask oxygen  Post-op Assessment: Report given to RN and Post -op Vital signs reviewed and stable  Post vital signs: Reviewed and stable  Last Vitals:  Vitals Value Taken Time  BP    Temp    Pulse    Resp    SpO2      Last Pain:  Vitals:   01/04/21 1344  TempSrc:   PainSc: 0-No pain         Complications: No complications documented.

## 2021-01-04 NOTE — Anesthesia Procedure Notes (Signed)
Procedure Name: General with mask airway Performed by: Fletcher-Harrison, Kassondra Geil, CRNA Pre-anesthesia Checklist: Patient identified, Suction available, Emergency Drugs available and Patient being monitored Patient Re-evaluated:Patient Re-evaluated prior to induction Oxygen Delivery Method: Simple face mask Induction Type: IV induction Placement Confirmation: positive ETCO2 and CO2 detector Dental Injury: Teeth and Oropharynx as per pre-operative assessment        

## 2021-01-04 NOTE — Care Management Obs Status (Signed)
[place] NOTIFICATION   Patient Details  Name: Glenn Kroft Sr. MRN: [id number] Date of Birth: [birth year]   Medicare Observation Status Notification Given:  Yes    Anselm Pancoast, RN 01/04/2021, 6:20 PM

## 2021-01-04 NOTE — Care Management (Signed)
TOC consult for home health needs Home health has assessed patient and recommends home health.   Patient currently off the floor for procedure

## 2021-01-04 NOTE — Anesthesia Postprocedure Evaluation (Signed)
Anesthesia Post Note  Patient: Glenn Carrozza Sr.  Procedure(s) Performed: COLONOSCOPY WITH PROPOFOL (N/A )  Patient location during evaluation: Endoscopy Anesthesia Type: General Level of consciousness: awake and alert Pain management: pain level controlled Vital Signs Assessment: post-procedure vital signs reviewed and stable Respiratory status: spontaneous breathing and respiratory function stable Cardiovascular status: stable Anesthetic complications: no   No complications documented.   Last Vitals:  Vitals:   01/04/21 1448 01/04/21 1458  BP: (!) 117/56 135/67  Pulse: 67 65  Resp: 13 11  Temp:    SpO2: 100% 100%    Last Pain:  Vitals:   01/04/21 1458  TempSrc:   PainSc: Asleep                 Anastacia Reinecke K

## 2021-01-04 NOTE — Care Management CC44 (Signed)
Condition Code 44 Documentation Completed  Patient Details  Name: Glenn Hylton Sr. MRN: [id number] Date of Birth: 1927-12-02   Condition Code 44 given:  Yes Patient signature on Condition Code 44 notice:  Yes Documentation of 2 MD's agreement:  Yes Code 44 added to claim:  Yes    Anselm Pancoast, RN 01/04/2021, 6:20 PM

## 2021-01-04 NOTE — Care Plan (Signed)
Colonoscopy consent signed and witnessed by writer at this time.

## 2021-01-05 ENCOUNTER — Encounter: Payer: Self-pay | Admitting: Emergency Medicine

## 2021-01-05 ENCOUNTER — Other Ambulatory Visit: Payer: Self-pay

## 2021-01-05 ENCOUNTER — Emergency Department
Admission: EM | Admit: 2021-01-05 | Discharge: 2021-01-05 | Disposition: A | Discharge status: Home / Self Care | Payer: Medicare PPO | Attending: Emergency Medicine | Admitting: Emergency Medicine

## 2021-01-05 DIAGNOSIS — N183 Chronic kidney disease, stage 3 unspecified: Secondary | ICD-10-CM | POA: Diagnosis not present

## 2021-01-05 DIAGNOSIS — Z87891 Personal history of nicotine dependence: Secondary | ICD-10-CM | POA: Diagnosis not present

## 2021-01-05 DIAGNOSIS — K625 Hemorrhage of anus and rectum: Secondary | ICD-10-CM | POA: Diagnosis present

## 2021-01-05 DIAGNOSIS — I129 Hypertensive chronic kidney disease with stage 1 through stage 4 chronic kidney disease, or unspecified chronic kidney disease: Secondary | ICD-10-CM | POA: Insufficient documentation

## 2021-01-05 DIAGNOSIS — Z79899 Other long term (current) drug therapy: Secondary | ICD-10-CM | POA: Insufficient documentation

## 2021-01-05 DIAGNOSIS — E039 Hypothyroidism, unspecified: Secondary | ICD-10-CM | POA: Insufficient documentation

## 2021-01-05 LAB — BPAM RBC
Blood Product Expiration Date: 202202092359
ISSUE DATE / TIME: 202201070010
Unit Type and Rh: 5100

## 2021-01-05 LAB — PROTIME-INR
INR: 1.1 (ref 0.8–1.2)
Prothrombin Time: 14.2 seconds (ref 11.4–15.2)

## 2021-01-05 LAB — COMPREHENSIVE METABOLIC PANEL
ALT: 15 U/L (ref 0–44)
AST: 34 U/L (ref 15–41)
Albumin: 3 g/dL — ABNORMAL LOW (ref 3.5–5.0)
Alkaline Phosphatase: 66 U/L (ref 38–126)
Anion gap: 8 (ref 5–15)
BUN: 37 mg/dL — ABNORMAL HIGH (ref 8–23)
CO2: 23 mmol/L (ref 22–32)
Calcium: 8.1 mg/dL — ABNORMAL LOW (ref 8.9–10.3)
Chloride: 108 mmol/L (ref 98–111)
Creatinine, Ser: 3.2 mg/dL — ABNORMAL HIGH (ref 0.61–1.24)
GFR, Estimated: 17 mL/min — ABNORMAL LOW (ref >60)
Glucose, Bld: 106 mg/dL — ABNORMAL HIGH (ref 70–99)
Potassium: 4.3 mmol/L (ref 3.5–5.1)
Sodium: 139 mmol/L (ref 135–145)
Total Bilirubin: 0.6 mg/dL (ref 0.3–1.2)
Total Protein: 6 g/dL — ABNORMAL LOW (ref 6.5–8.1)

## 2021-01-05 LAB — CBC
HCT: 24.2 % — ABNORMAL LOW (ref 39.0–52.0)
Hemoglobin: 7.8 g/dL — ABNORMAL LOW (ref 13.0–17.0)
MCH: 30.6 pg (ref 26.0–34.0)
MCHC: 32.2 g/dL (ref 30.0–36.0)
MCV: 94.9 fL (ref 80.0–100.0)
Platelets: 88 10*3/uL — ABNORMAL LOW (ref 150–400)
RBC: 2.55 MIL/uL — ABNORMAL LOW (ref 4.22–5.81)
RDW: 14.7 % (ref 11.5–15.5)
WBC: 9 10*3/uL (ref 4.0–10.5)
nRBC: 0 % (ref 0.0–0.2)

## 2021-01-05 LAB — HEMOGLOBIN AND HEMATOCRIT, BLOOD
HCT: 23.6 % — ABNORMAL LOW (ref 39.0–52.0)
Hemoglobin: 7.6 g/dL — ABNORMAL LOW (ref 13.0–17.0)

## 2021-01-05 LAB — TYPE AND SCREEN
ABO/RH(D): O POS
ABO/RH(D): O POS
Antibody Screen: NEGATIVE
Antibody Screen: NEGATIVE
Unit division: 0

## 2021-01-05 LAB — APTT: aPTT: 31 seconds (ref 24–36)

## 2021-01-05 NOTE — ED Triage Notes (Signed)
FIRST NURSE: Pt had colonoscopy 2 days ago with some polyps removed.  Pt reports rectal bleeding for last 12 hours. VSS

## 2021-01-05 NOTE — Discharge Instructions (Addendum)
Please seek medical attention for any high fevers, chest pain, shortness of breath, change in behavior, persistent vomiting, bloody stool or any other new or concerning symptoms.  

## 2021-01-05 NOTE — ED Notes (Signed)
Pt reports he had colonoscopy yesterday and woke up a 5 today with rectal bleeding. Pt AOX4, denies pain, NAD noted.

## 2021-01-05 NOTE — ED Triage Notes (Signed)
Pt reports rectal bleeding since 5am this morning. Pt reports the blood is bright red, denies pain and reports the bleeding happened once when he used the bathroom this am. Pt reports recent colonoscopy

## 2021-01-05 NOTE — ED Provider Notes (Signed)
[facility] Emergency Department Provider Note  ____________________________________________   I have reviewed the triage vital signs and the nursing notes.   HISTORY  Chief Complaint Rectal Bleeding   History limited by: Not Limited   HPI Glenn Davitt Sr. is a 87 y.o. male who presents to the emergency department today after an episode of bright red blood per rectum. The patient was discharged from the hospital yesterday for GI bleed. Underwent colonoscopy. At 5 am today had a bowel movement that had bright red blood with it. He denies any further episodes of bleeding. Denies any weakness or shortness of breath. Has not had any abdominal pain since colonoscopy.    Records reviewed. Per medical record review patient has a history of hospitalization with discharge yesterday for rectal bleeding. Had a colonoscopy performed which showed two angiodysplastic lesions which were treated and non bleeding internal hemorrhoids.   Past Medical History:  Diagnosis Date  . Arthritis   . GERD (gastroesophageal reflux disease)   . Hemorrhoid 1986  . Hyperlipidemia   . Hypertension   . SVT (supraventricular tachycardia) ([place])   . Thyroid disease   . Vertigo     Patient Active Problem List   Diagnosis Date Noted  . Severe anemia 01/04/2021  . Lower GI bleed   . Normocytic anemia   . Sustained SVT ([place]) 03/31/2018  . PSVT (paroxysmal supraventricular tachycardia) ([place]) 03/29/2018  . Syncope and collapse 11/24/2017  . Edema 10/05/2015  . Allergic rhinitis 07/05/2015  . Arthritis 07/05/2015  . Benign hypertension 07/05/2015  . Chronic kidney disease (CKD), stage III (moderate) ([place]) 07/05/2015  . Acid reflux 07/05/2015  . Gout 07/05/2015  . HLD (hyperlipidemia) 07/05/2015  . Adult hypothyroidism 07/05/2015  . Cannot sleep 07/05/2015  . Hernia, inguinal 07/05/2015  . LBP (low back pain) 07/05/2015  . Cramp in muscle 07/05/2015  . Leg paresthesia 07/05/2015   . CA of prostate ([place]) 07/05/2015  . Cutaneous eruption 07/05/2015  . Head revolving around 07/05/2015  . Hypothyroidism 07/03/2015  . Right inguinal hernia 05/30/2014    Past Surgical History:  Procedure Laterality Date  . APPENDECTOMY  1968  . BACK SURGERY    . COLONOSCOPY N/A 11/27/2017   Procedure: COLONOSCOPY;  Surgeon: Toledo, Benay Pike, MD;  Location: [facility] ENDOSCOPY;  Service: Gastroenterology;  Laterality: N/A;  . COLONOSCOPY WITH PROPOFOL N/A 01/04/2021   Procedure: COLONOSCOPY WITH PROPOFOL;  Surgeon: Lucilla Lame, MD;  Location: [facility] ENDOSCOPY;  Service: Endoscopy;  Laterality: N/A;  . ESOPHAGOGASTRODUODENOSCOPY (EGD) WITH PROPOFOL N/A 11/25/2017   Procedure: ESOPHAGOGASTRODUODENOSCOPY (EGD) WITH PROPOFOL;  Surgeon: Toledo, Benay Pike, MD;  Location: [facility] ENDOSCOPY;  Service: Gastroenterology;  Laterality: N/A;  . EXCISIONAL HEMORRHOIDECTOMY    . INGUINAL HERNIA REPAIR Right 03/17/2016   Procedure: LAPAROSCOPIC INGUINAL HERNIA;  Surgeon: Florene Glen, MD;  Location: [facility] ORS;  Service: General;  Laterality: Right;  . [place]    Prior to Admission medications   Medication Sig Start Date End Date Taking? Authorizing Provider  allopurinol (ZYLOPRIM) 100 MG tablet TAKE 1/2 TABLET BY MOUTH ONCE DAILY Patient taking differently: Take 50 mg by mouth daily. 02/07/20   Chrismon, Vickki Muff, PA-C  amiodarone (PACERONE) 200 MG tablet TAKE 1 TABLET BY MOUTH ONCE DAILY Patient taking differently: Take 200 mg by mouth daily. 10/01/20   Chrismon, Vickki Muff, PA-C  amLODipine (NORVASC) 5 MG tablet TAKE 1 TABLET BY MOUTH ONCE DAILY Patient taking differently: Take 5 mg by mouth daily. 10/01/20  Chrismon, Vickki Muff, PA-C  ferrous sulfate 325 (65 FE) MG tablet Take 1 tablet (325 mg total) by mouth every other day. 01/04/21   Jennye Boroughs, MD  levothyroxine (SYNTHROID) 100 MCG tablet Take 1 tablet (100 mcg total) by mouth daily. 10/23/20   Chrismon, Vickki Muff, PA-C  Multiple  Vitamins-Iron (MULTIVITAMINS WITH IRON) TABS tablet Take 1 tablet by mouth daily. 01/04/19   Chrismon, Vickki Muff, PA-C  pantoprazole (PROTONIX) 40 MG tablet Take 1 tablet (40 mg total) by mouth daily. 01/05/20   Virginia Crews, MD  traZODone (DESYREL) 50 MG tablet TAKE 1 TABLET BY MOUTH AT BEDTIME Patient taking differently: Take 50 mg by mouth at bedtime. 10/01/20   Chrismon, Vickki Muff, PA-C    Allergies Codeine and Cortizone-10  [hydrocortisone]  Family History  Problem Relation Age of Onset  . Alzheimer's disease Father   . Healthy Sister     Social History Social History   Tobacco Use  . Smoking status: Former Smoker    Packs/day: 2.00    Years: 25.00    Pack years: 50.00    Quit date: 12/28/1965    Years since quitting: 55.0  . Smokeless tobacco: Never Used  Vaping Use  . Vaping Use: Never used  Substance Use Topics  . Alcohol use: No    Comment: 1 beer occasionally  . Drug use: No    Review of Systems Constitutional: No fever/chills Eyes: No visual changes. ENT: No sore throat. Cardiovascular: Denies chest pain. Respiratory: Denies shortness of breath. Gastrointestinal: Positive for rectal bleed.  Genitourinary: Negative for dysuria. Musculoskeletal: Negative for back pain. Skin: Negative for rash. Neurological: Negative for headaches, focal weakness or numbness.  ____________________________________________   PHYSICAL EXAM:  VITAL SIGNS: ED Triage Vitals  Enc Vitals Group     BP 01/05/21 1046 (!) 162/72     Pulse Rate 01/05/21 1034 70     Resp 01/05/21 1034 18     Temp 01/05/21 1034 98.6 F (37 C)     Temp Source 01/05/21 1034 Oral     SpO2 01/05/21 1034 99 %     Weight 01/05/21 1031 165 lb (74.8 kg)     Height 01/05/21 1031 6\' 1"  (1.854 m)     Head Circumference --      Peak Flow --      Pain Score 01/05/21 1031 0   Constitutional: Alert and oriented.  Eyes: Conjunctivae are normal.  ENT      Head: Normocephalic and atraumatic.      Nose:  No congestion/rhinnorhea.      Mouth/Throat: Mucous membranes are moist.      Neck: No stridor. Hematological/Lymphatic/Immunilogical: No cervical lymphadenopathy. Cardiovascular: Normal rate, regular rhythm.  No murmurs, rubs, or gallops.  Respiratory: Normal respiratory effort without tachypnea nor retractions. Breath sounds are clear and equal bilaterally. No wheezes/rales/rhonchi. Gastrointestinal: Soft and non tender. No rebound. No guarding.  Genitourinary: Deferred Musculoskeletal: Normal range of motion in all extremities. No lower extremity edema. Neurologic:  Normal speech and language. No gross focal neurologic deficits are appreciated.  Skin:  Skin is warm, dry and intact. No rash noted. Psychiatric: Mood and affect are normal. Speech and behavior are normal. Patient exhibits appropriate insight and judgment.  ____________________________________________    LABS (pertinent positives/negatives)  CMP na 139, k 4.3, glu 106, cr 3.20 CBC wbc 9.0, hgb 7.8, plt 88 INR 1.1 PTT 31 Repeat hg 7.6 ____________________________________________   EKG  None  ____________________________________________    RADIOLOGY  None  ____________________________________________   PROCEDURES  Procedures  ____________________________________________   INITIAL IMPRESSION / ASSESSMENT AND PLAN / ED COURSE  Pertinent labs & imaging results that were available during my care of the patient were reviewed by me and considered in my medical decision making (see chart for details).   Patient presented to the emergency department today because of concerns for rectal bleed.  Patient was just discharged from the hospital yesterday for the same complaint.  Patient only had 1 episode today at around 5 AM.  Initial hemoglobin here did show slightly worsening anemia from discharge hemoglobin yesterday.  Patient was observed in the emergency department for a number of hours.  Repeat hemoglobin  essentially unchanged.  Patient without any further bloody bowel movements.  I did have his discussion with Dr. Alice Reichert with GI.  At this time he did not feel like patient would necessarily benefit from hospitalization and certainly not from repeat colonoscopy.  I did discuss the case with the patient's daughter over the telephone.  At this time given that patient has remained stable and has had no further bloody bowel movements I do think it is reasonable for patient to be discharged home.  We did discuss bleeding return precautions.  Discussed importance of frequent and regular blood checks with his primary care doctor.  ____________________________________________   FINAL CLINICAL IMPRESSION(S) / ED DIAGNOSES  Final diagnoses:  Rectal bleeding     Note: This dictation was prepared with Dragon dictation. Any transcriptional errors that result from this process are unintentional     Nance Pear, MD 01/05/21 1939

## 2021-01-09 ENCOUNTER — Telehealth: Payer: Self-pay

## 2021-01-09 NOTE — Telephone Encounter (Signed)
Copied from [place] [id number]. Topic: General - Other >> Jan 09, 2021 11:09 AM Celene Kras wrote: Reason for CRM: Alan Mulder, pts daughter, calling stating that the doctor from the hospital has advised her to have pt come in once a week to have his hemoglobin checked by PCP. Please advise.

## 2021-01-10 NOTE — Telephone Encounter (Signed)
Order CBC with platelets to follow up GI bleed and schedule follow up appointment next week.

## 2021-01-15 NOTE — Telephone Encounter (Signed)
Daughter would like to do the labs at the time he is seen next week

## 2021-01-17 ENCOUNTER — Telehealth: Payer: Self-pay | Admitting: Family Medicine

## 2021-01-17 NOTE — Telephone Encounter (Signed)
Home Health Verbal Orders - Caller/Agency: [place] /[facility] Number: [phone] VM can be left  Requesting PT Frequency: 1x a week for 2 weeks  2x's a week for 1 week 1x a week for 4 weeks  Every other week for two weeks

## 2021-01-17 NOTE — Telephone Encounter (Signed)
Agree with verbal orders above. Send authorization.

## 2021-01-18 NOTE — Telephone Encounter (Signed)
Verbal orders given  

## 2021-01-22 ENCOUNTER — Ambulatory Visit: Payer: Medicare PPO | Admitting: Family Medicine

## 2021-01-22 ENCOUNTER — Encounter: Payer: Self-pay | Admitting: Family Medicine

## 2021-01-22 ENCOUNTER — Other Ambulatory Visit: Payer: Self-pay

## 2021-01-22 VITALS — BP 158/67 | HR 70 | Temp 98.3°F | Ht 67.0 in | Wt 136.0 lb

## 2021-01-22 DIAGNOSIS — G4762 Sleep related leg cramps: Secondary | ICD-10-CM

## 2021-01-22 DIAGNOSIS — D649 Anemia, unspecified: Secondary | ICD-10-CM | POA: Diagnosis not present

## 2021-01-22 DIAGNOSIS — K922 Gastrointestinal hemorrhage, unspecified: Secondary | ICD-10-CM

## 2021-01-22 NOTE — Progress Notes (Signed)
Established Patient Office Visit  Subjective:  Patient ID: Glenn Walen., male    DOB: Feb 04, 1927  Age: 87 y.o. MRN: [id number]  CC: No chief complaint on file.   HPI Mr. Glenn Masden Sr. 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  He felt dizzy after having a large bloody bowel movement.  Apparently, he suddenly stopped talking but he remained conscious.   Initial hemoglobin in the ED was 7.3 but it dropped to 6.9.  He was admitted to the hospital for acute lower GI bleeding complicated by acute blood loss anemia and presyncope.  He was transfused with 2 units of packed red blood cells.  He was evaluated by the gastroenterologist, Dr. Allen Norris.  He underwent colonoscopy.  Colonoscopy revealed nonbleeding colonic angiodysplastic lesions that were treated with argon plasma coagulation.  He also has nonbleeding internal hemorrhoids.  There were no further episodes of rectal bleeding.  Posttransfusion hemoglobin was 9.1  On 01-05-21, he had another bright red BM with Hgb drop to 7.8 in the ER. Was stable and discharged home.  Past Medical History:  Diagnosis Date   Arthritis    GERD (gastroesophageal reflux disease)    Hemorrhoid 1986   Hyperlipidemia    Hypertension    SVT (supraventricular tachycardia) ([place])    Thyroid disease    Vertigo     Past Surgical History:  Procedure Laterality Date   APPENDECTOMY  1968   BACK SURGERY     COLONOSCOPY N/A 11/27/2017   Procedure: COLONOSCOPY;  Surgeon: Toledo, Benay Pike, MD;  Location: [facility] ENDOSCOPY;  Service: Gastroenterology;  Laterality: N/A;   COLONOSCOPY WITH PROPOFOL N/A 01/04/2021   Procedure: COLONOSCOPY WITH PROPOFOL;  Surgeon: Lucilla Lame, MD;  Location[facility] ENDOSCOPY;  Service: Endoscopy;  Laterality: N/A;   ESOPHAGOGASTRODUODENOSCOPY  (EGD) WITH PROPOFOL N/A 11/25/2017   Procedure: ESOPHAGOGASTRODUODENOSCOPY (EGD) WITH PROPOFOL;  Surgeon: Toledo, Benay Pike, MD;  Location: [facility] ENDOSCOPY;  Service: Gastroenterology;  Laterality: N/A;   EXCISIONAL HEMORRHOIDECTOMY     INGUINAL HERNIA REPAIR Right 03/17/2016   Procedure: LAPAROSCOPIC INGUINAL HERNIA;  Surgeon: Florene Glen, MD;  Location: [facility] ORS;  Service: General;  Laterality: Right;   [place]    Family History  Problem Relation Age of Onset   Alzheimer's disease Father    Healthy Sister     Social History   Socioeconomic History   Marital status: Widowed    Spouse name: Not on file   Number of children: Not on file   Years of education: Not on file   Highest education level: Not on file  Occupational History   Occupation: retired  Tobacco Use   Smoking status: Former Smoker    Packs/day: 2.00    Years: 25.00    Pack years: 50.00    Quit date: 12/28/1965    Years since quitting: 55.1   Smokeless tobacco: Never Used  Vaping Use   Vaping Use: Never used  Substance and Sexual Activity   Alcohol use: No    Comment: 1 beer occasionally   Drug use: No   Sexual activity: Not Currently  Other Topics Concern   Not on file  Social History Narrative   Not on file   Social Determinants of Health   Financial Resource Strain: Not on file  Food Insecurity: Not on  file  Transportation Needs: Not on file  Physical Activity: Not on file  Stress: Not on file  Social Connections: Not on file  Intimate Partner Violence: Not on file    Outpatient Medications Prior to Visit  Medication Sig Dispense Refill   allopurinol (ZYLOPRIM) 100 MG tablet TAKE 1/2 TABLET BY MOUTH ONCE DAILY (Patient taking differently: Take 50 mg by mouth daily.) 30 tablet 3   amiodarone (PACERONE) 200 MG tablet TAKE 1 TABLET BY MOUTH ONCE DAILY (Patient taking differently: Take 200 mg by mouth daily.) 90 tablet 1   amLODipine (NORVASC) 5 MG tablet TAKE 1 TABLET BY MOUTH  ONCE DAILY (Patient taking differently: Take 5 mg by mouth daily.) 90 tablet 0   ferrous sulfate 325 (65 FE) MG tablet Take 1 tablet (325 mg total) by mouth every other day. 30 tablet 0   levothyroxine (SYNTHROID) 100 MCG tablet Take 1 tablet (100 mcg total) by mouth daily. 90 tablet 0   Multiple Vitamins-Iron (MULTIVITAMINS WITH IRON) TABS tablet Take 1 tablet by mouth daily. 90 tablet 1   pantoprazole (PROTONIX) 40 MG tablet Take 1 tablet (40 mg total) by mouth daily. 90 tablet 3   traZODone (DESYREL) 50 MG tablet TAKE 1 TABLET BY MOUTH AT BEDTIME (Patient taking differently: Take 50 mg by mouth at bedtime.) 90 tablet 0   No facility-administered medications prior to visit.    Allergies  Allergen Reactions   Codeine    Cortizone-10  [Hydrocortisone]     ROS Review of Systems  Gastrointestinal: Negative for abdominal distention, abdominal pain, anal bleeding, blood in stool, constipation, diarrhea, nausea, rectal pain and vomiting.      Objective:    Physical Exam Constitutional:      General: He is not in acute distress.    Appearance: He is well-developed and well-nourished.  HENT:     Head: Normocephalic and atraumatic.     Right Ear: Hearing normal.     Left Ear: Hearing normal.     Nose: Nose normal.  Eyes:     General: Lids are normal. No scleral icterus.       Right eye: No discharge.        Left eye: No discharge.     Conjunctiva/sclera: Conjunctivae normal.  Cardiovascular:     Rate and Rhythm: Regular rhythm.     Heart sounds: Normal heart sounds.  Pulmonary:     Effort: Pulmonary effort is normal. No respiratory distress.     Breath sounds: Normal breath sounds.  Abdominal:     General: Bowel sounds are normal.     Palpations: Abdomen is soft.  Musculoskeletal:        General: Normal range of motion.  Skin:    General: Skin is intact.     Findings: No lesion or rash.  Neurological:     Mental Status: He is alert and oriented to person, place, and time.   Psychiatric:        Mood and Affect: Mood and affect normal.        Speech: Speech normal.        Behavior: Behavior normal.        Thought Content: Thought content normal.     BP (!) 158/67 (BP Location: Right Arm, Patient Position: Sitting, Cuff Size: Large)   Pulse 70   Temp 98.3 F (36.8 C) (Oral)   Ht 5\' 7"  (1.702 m)   Wt 136 lb (61.7 kg)   SpO2 100%   BMI 21.30  kg/m  Wt Readings from Last 3 Encounters:  01/05/21 165 lb (74.8 kg)  01/04/21 138 lb 14.2 oz (63 kg)  10/15/20 139 lb (63 kg)     There are no preventive care reminders to display for this patient.  There are no preventive care reminders to display for this patient.  Lab Results  Component Value Date   TSH 98.100 (H) 10/15/2020   Lab Results  Component Value Date   WBC 9.0 01/05/2021   HGB 7.6 (L) 01/05/2021   HCT 23.6 (L) 01/05/2021   MCV 94.9 01/05/2021   PLT 88 (L) 01/05/2021   Lab Results  Component Value Date   NA 139 01/05/2021   K 4.3 01/05/2021   CO2 23 01/05/2021   GLUCOSE 106 (H) 01/05/2021   BUN 37 (H) 01/05/2021   CREATININE 3.20 (H) 01/05/2021   BILITOT 0.6 01/05/2021   ALKPHOS 66 01/05/2021   AST 34 01/05/2021   ALT 15 01/05/2021   PROT 6.0 (L) 01/05/2021   ALBUMIN 3.0 (L) 01/05/2021   CALCIUM 8.1 (L) 01/05/2021   ANIONGAP 8 01/05/2021   Lab Results  Component Value Date   CHOL 176 10/15/2020   Lab Results  Component Value Date   HDL 59 10/15/2020   Lab Results  Component Value Date   LDLCALC 107 (H) 10/15/2020   Lab Results  Component Value Date   TRIG 50 10/15/2020   Lab Results  Component Value Date   CHOLHDL 3.2 04/02/2017   No results found for: HGBA1C    Assessment & Plan:   1. Lower GI bleed Hospitalized 01-03-21 to 01-04-21 with severe anemia from lower GI bleed. Has a history of colonic AVM's per colonoscopy in 2018. After transfusion of 2 units of PRBC's, Dr. Allen Norris (GI) found non-bleeding colonic angiodysplastic lesion that were treated with argon  plasma coagulation. Hgb came up to 9.9 after the transfusions. Was discharged home on 01-04-21. Had another bright red BM on 01-05-21 and returned to the ER where Hgb was 7.8. He stabilized and was discharged home after ER staff notified and discussed case with GI (Dr. Alice Reichert). Presently seems stable and good spirits. Will recheck labs. Encouraged to drink plenty of fluids and eat well. - CBC with Differential/Platelet - Comprehensive metabolic panel - Iron  2. Nocturnal leg cramps Recheck labs for dehydration and/or electrolyte imbalance from GI bleeds. Recheck CBC and CMP. Drink plenty of fluids and follow up with nephrologist. - CBC with Differential/Platelet - Comprehensive metabolic panel  3. Severe anemia - CBC with Differential/Platelet - Iron   No orders of the defined types were placed in this encounter.   Follow-up: No follow-ups on file.    Sylvester Harder, CMA   I, Simona Huh Bertran Zeimet, PA-C, have reviewed all documentation for this visit. The documentation on 01/22/21 for the exam, diagnosis, procedures, and orders are all accurate and complete.

## 2021-01-23 ENCOUNTER — Other Ambulatory Visit: Payer: Self-pay | Admitting: Family Medicine

## 2021-01-23 LAB — COMPREHENSIVE METABOLIC PANEL
ALT: 7 IU/L (ref 0–44)
AST: 18 IU/L (ref 0–40)
Albumin/Globulin Ratio: 1.1 — ABNORMAL LOW (ref 1.2–2.2)
Albumin: 3.4 g/dL — ABNORMAL LOW (ref 3.5–4.6)
Alkaline Phosphatase: 80 IU/L (ref 44–121)
BUN/Creatinine Ratio: 8 — ABNORMAL LOW (ref 10–24)
BUN: 31 mg/dL (ref 10–36)
Bilirubin Total: <0.2 mg/dL (ref 0.0–1.2)
CO2: 22 mmol/L (ref 20–29)
Calcium: 8.5 mg/dL — ABNORMAL LOW (ref 8.6–10.2)
Chloride: 106 mmol/L (ref 96–106)
Creatinine, Ser: 3.81 mg/dL — ABNORMAL HIGH (ref 0.76–1.27)
GFR calc Af Amer: 15 mL/min/{1.73_m2} — ABNORMAL LOW (ref >59)
GFR calc non Af Amer: 13 mL/min/{1.73_m2} — ABNORMAL LOW (ref >59)
Globulin, Total: 3.2 g/dL (ref 1.5–4.5)
Glucose: 98 mg/dL (ref 65–99)
Potassium: 5.2 mmol/L (ref 3.5–5.2)
Sodium: 141 mmol/L (ref 134–144)
Total Protein: 6.6 g/dL (ref 6.0–8.5)

## 2021-01-23 LAB — CBC WITH DIFFERENTIAL/PLATELET
Basophils Absolute: 0 10*3/uL (ref 0.0–0.2)
Basos: 1 %
EOS (ABSOLUTE): 0.1 10*3/uL (ref 0.0–0.4)
Eos: 1 %
Hematocrit: 22.6 % — ABNORMAL LOW (ref 37.5–51.0)
Hemoglobin: 7.4 g/dL — ABNORMAL LOW (ref 13.0–17.7)
Immature Grans (Abs): 0 10*3/uL (ref 0.0–0.1)
Immature Granulocytes: 0 %
Lymphocytes Absolute: 2.8 10*3/uL (ref 0.7–3.1)
Lymphs: 33 %
MCH: 30.5 pg (ref 26.6–33.0)
MCHC: 32.7 g/dL (ref 31.5–35.7)
MCV: 93 fL (ref 79–97)
Monocytes Absolute: 0.7 10*3/uL (ref 0.1–0.9)
Monocytes: 8 %
Neutrophils Absolute: 4.9 10*3/uL (ref 1.4–7.0)
Neutrophils: 57 %
Platelets: 154 10*3/uL (ref 150–450)
RBC: 2.43 x10E6/uL — CL (ref 4.14–5.80)
RDW: 12.8 % (ref 11.6–15.4)
WBC: 8.5 10*3/uL (ref 3.4–10.8)

## 2021-01-23 LAB — IRON: Iron: 19 ug/dL — ABNORMAL LOW (ref 38–169)

## 2021-01-23 NOTE — Telephone Encounter (Signed)
Requested Prescriptions  Pending Prescriptions Disp Refills  . amLODipine (NORVASC) 5 MG tablet [Pharmacy Med Name: AMLODIPINE BESYLATE 5 MG TAB] 90 tablet 0    Sig: TAKE 1 TABLET BY MOUTH ONCE DAILY     Cardiovascular:  Calcium Channel Blockers Failed - 01/23/2021  9:16 AM      Failed - Last BP in normal range    BP Readings from Last 1 Encounters:  01/22/21 (!) 158/67         Passed - Valid encounter within last 6 months    Recent Outpatient Visits          Yesterday Lower GI bleed   [place], PA-C   3 months ago Benign hypertension   [facility], Vickki Muff, PA-C   11 months ago Benign hypertension   [facility], Vickki Muff, PA-C   2 years ago Benign hypertension   [facility], Vickki Muff, PA-C   2 years ago Benign hypertension   [facility], Vickki Muff, [place]

## 2021-01-24 ENCOUNTER — Other Ambulatory Visit: Payer: Self-pay | Admitting: Family Medicine

## 2021-01-24 DIAGNOSIS — E039 Hypothyroidism, unspecified: Secondary | ICD-10-CM

## 2021-01-24 NOTE — Telephone Encounter (Signed)
Requested medication (s) are due for refill today: yes  Requested medication (s) are on the active medication list: yes  Last refill:  10/23/20  Future visit scheduled: no  Notes to clinic:  Please review for refill. Unable to refill per protocol. Last TSH level on 10/15/20 abnormal. Patient due for follow up labs    Requested Prescriptions  Pending Prescriptions Disp Refills   levothyroxine (SYNTHROID) 100 MCG tablet [Pharmacy Med Name: LEVOTHYROXINE SODIUM 100 MCG TAB] 90 tablet 0    Sig: TAKE 1 TABLET BY MOUTH ONCE DAILY      Endocrinology:  Hypothyroid Agents Failed - 01/24/2021  2:10 PM      Failed - TSH needs to be rechecked within 3 months after an abnormal result. Refill until TSH is due.      Failed - TSH in normal range and within 360 days    TSH  Date Value Ref Range Status  10/15/2020 98.100 (H) 0.450 - 4.500 uIU/mL Final          Passed - Valid encounter within last 12 months    Recent Outpatient Visits           2 days ago Lower GI bleed   [place], PA-C   3 months ago Benign hypertension   [place], PA-C   11 months ago Benign hypertension   [facility], Vickki Muff, PA-C   2 years ago Benign hypertension   [facility], Vickki Muff, PA-C   2 years ago Benign hypertension   [facility], Vickki Muff, [place]

## 2021-02-11 ENCOUNTER — Other Ambulatory Visit: Payer: Self-pay | Admitting: Family Medicine

## 2021-02-11 NOTE — Telephone Encounter (Signed)
Requested Prescriptions  Pending Prescriptions Disp Refills  . traZODone (DESYREL) 50 MG tablet [Pharmacy Med Name: TRAZODONE HCL 50 MG TAB] 90 tablet 0    Sig: TAKE 1 TABLET BY MOUTH AT BEDTIME     Psychiatry: Antidepressants - Serotonin Modulator Passed - 02/11/2021 11:43 AM      Passed - Valid encounter within last 6 months    Recent Outpatient Visits          2 weeks ago Lower GI bleed   [place], PA-C   3 months ago Benign hypertension   [facility], Vickki Muff, PA-C   1 year ago Benign hypertension   [facility], Vickki Muff, PA-C   2 years ago Benign hypertension   [facility], Vickki Muff, PA-C   2 years ago Benign hypertension   [facility], Vickki Muff, PA-C             Refused Prescriptions Disp Refills  . allopurinol (ZYLOPRIM) 100 MG tablet [Pharmacy Med Name: ALLOPURINOL 100 MG TAB] 30 tablet 3    Sig: TAKE 1/2 TABLET BY MOUTH ONCE DAILY     Endocrinology:  Gout Agents Failed - 02/11/2021 11:43 AM      Failed - Cr in normal range and within 360 days    Creat  Date Value Ref Range Status  10/22/2017 3.09 (H) 0.70 - 1.11 mg/dL Final    Comment:    Verified by repeat analysis. . For patients >55 years of age, the reference limit for Creatinine is approximately 13% higher for people identified as African-American. .    Creatinine, Ser  Date Value Ref Range Status  01/22/2021 3.81 (H) 0.76 - 1.27 mg/dL Final         Passed - Uric Acid in normal range and within 360 days    Uric Acid, Serum  Date Value Ref Range Status  01/03/2021 7.5 3.7 - 8.6 mg/dL Final    Comment:    Performed at [facility], [address], [place], [place] [address]   Uric Acid  Date Value Ref Range Status  02/02/2020 6.2 3.8 - 8.4 mg/dL Final    Comment:               Therapeutic target for gout patients: <6.0         Passed - Valid encounter  within last 12 months    Recent Outpatient Visits          2 weeks ago Lower GI bleed   [place], PA-C   3 months ago Benign hypertension   [facility], Vickki Muff, PA-C   1 year ago Benign hypertension   [facility], Vickki Muff, PA-C   2 years ago Benign hypertension   [facility], Vickki Muff, PA-C   2 years ago Benign hypertension   [facility], [place] E, PA-C             . amLODipine (NORVASC) 5 MG tablet [Pharmacy Med Name: AMLODIPINE BESYLATE 5 MG TAB] 90 tablet 0    Sig: TAKE 1 TABLET BY MOUTH ONCE DAILY     Cardiovascular:  Calcium Channel Blockers Failed - 02/11/2021 11:43 AM      Failed - Last BP in normal range    BP Readings from Last 1 Encounters:  01/22/21 (!) 158/67         Passed -  Valid encounter within last 6 months    Recent Outpatient Visits          2 weeks ago Lower GI bleed   [place], PA-C   3 months ago Benign hypertension   [facility], Vickki Muff, PA-C   1 year ago Benign hypertension   [facility], Vickki Muff, PA-C   2 years ago Benign hypertension   [facility], Vickki Muff, PA-C   2 years ago Benign hypertension   [facility], Vickki Muff, [place]

## 2021-02-20 ENCOUNTER — Telehealth: Payer: Self-pay

## 2021-02-20 DIAGNOSIS — E034 Atrophy of thyroid (acquired): Secondary | ICD-10-CM

## 2021-02-20 DIAGNOSIS — N183 Chronic kidney disease, stage 3 unspecified: Secondary | ICD-10-CM

## 2021-02-20 DIAGNOSIS — D508 Other iron deficiency anemias: Secondary | ICD-10-CM

## 2021-02-20 NOTE — Telephone Encounter (Signed)
Pt is due for lab work.

## 2021-02-20 NOTE — Telephone Encounter (Signed)
Labs ordered.

## 2021-02-21 ENCOUNTER — Other Ambulatory Visit: Payer: Self-pay | Admitting: Physician Assistant

## 2021-02-21 DIAGNOSIS — E039 Hypothyroidism, unspecified: Secondary | ICD-10-CM

## 2021-02-21 LAB — COMP. METABOLIC PANEL (12)
AST: 24 IU/L (ref 0–40)
Albumin/Globulin Ratio: 1 — ABNORMAL LOW (ref 1.2–2.2)
Albumin: 3.3 g/dL — ABNORMAL LOW (ref 3.5–4.6)
Alkaline Phosphatase: 94 IU/L (ref 44–121)
BUN/Creatinine Ratio: 8 — ABNORMAL LOW (ref 10–24)
BUN: 26 mg/dL (ref 10–36)
Bilirubin Total: 0.3 mg/dL (ref 0.0–1.2)
Calcium: 8.5 mg/dL — ABNORMAL LOW (ref 8.6–10.2)
Chloride: 105 mmol/L (ref 96–106)
Creatinine, Ser: 3.24 mg/dL — ABNORMAL HIGH (ref 0.76–1.27)
GFR calc Af Amer: 18 mL/min/{1.73_m2} — ABNORMAL LOW (ref >59)
GFR calc non Af Amer: 16 mL/min/{1.73_m2} — ABNORMAL LOW (ref >59)
Globulin, Total: 3.3 g/dL (ref 1.5–4.5)
Glucose: 82 mg/dL (ref 65–99)
Potassium: 5.1 mmol/L (ref 3.5–5.2)
Sodium: 138 mmol/L (ref 134–144)
Total Protein: 6.6 g/dL (ref 6.0–8.5)

## 2021-02-21 LAB — CBC WITH DIFFERENTIAL/PLATELET
Basophils Absolute: 0 10*3/uL (ref 0.0–0.2)
Basos: 0 %
EOS (ABSOLUTE): 0.1 10*3/uL (ref 0.0–0.4)
Eos: 1 %
Hematocrit: 25.3 % — ABNORMAL LOW (ref 37.5–51.0)
Hemoglobin: 8.1 g/dL — ABNORMAL LOW (ref 13.0–17.7)
Immature Grans (Abs): 0 10*3/uL (ref 0.0–0.1)
Immature Granulocytes: 0 %
Lymphocytes Absolute: 2.1 10*3/uL (ref 0.7–3.1)
Lymphs: 31 %
MCH: 29.7 pg (ref 26.6–33.0)
MCHC: 32 g/dL (ref 31.5–35.7)
MCV: 93 fL (ref 79–97)
Monocytes Absolute: 0.5 10*3/uL (ref 0.1–0.9)
Monocytes: 8 %
Neutrophils Absolute: 4.2 10*3/uL (ref 1.4–7.0)
Neutrophils: 60 %
Platelets: 128 10*3/uL — ABNORMAL LOW (ref 150–450)
RBC: 2.73 x10E6/uL — CL (ref 4.14–5.80)
RDW: 12.7 % (ref 11.6–15.4)
WBC: 6.9 10*3/uL (ref 3.4–10.8)

## 2021-02-21 LAB — TSH: TSH: 12.1 u[IU]/mL — ABNORMAL HIGH (ref 0.450–4.500)

## 2021-02-21 LAB — IRON,TIBC AND FERRITIN PANEL
Ferritin: 58 ng/mL (ref 30–400)
Iron Saturation: 9 % — CL (ref 15–55)
Iron: 22 ug/dL — ABNORMAL LOW (ref 38–169)
Total Iron Binding Capacity: 233 ug/dL — ABNORMAL LOW (ref 250–450)
UIBC: 211 ug/dL (ref 111–343)

## 2021-02-21 MED ORDER — LEVOTHYROXINE SODIUM 112 MCG PO TABS: 112.0000 ug | ORAL_TABLET | Freq: Every day | ORAL | 0 refills | Status: DC

## 2021-02-21 NOTE — Progress Notes (Signed)
Levothyroxine increased from 147mcg to 117mcg based on labs

## 2021-03-01 ENCOUNTER — Other Ambulatory Visit: Payer: Self-pay | Admitting: Family Medicine

## 2021-03-01 DIAGNOSIS — D508 Other iron deficiency anemias: Secondary | ICD-10-CM

## 2021-03-01 MED ORDER — FERROUS SULFATE 325 (65 FE) MG PO TABS: 325.0000 mg | ORAL_TABLET | ORAL | 0 refills | Status: DC

## 2021-03-01 NOTE — Telephone Encounter (Signed)
Medication: ferrous sulfate 325 (65 FE) MG tablet  Has the pt contacted their pharmacy? Yes but they advised her to call  Preferred pharmacy: [facility], [place].  Please be advised refills may take up to 3 business days.  We ask that you follow up with your pharmacy.

## 2021-03-01 NOTE — Telephone Encounter (Signed)
Requested medication (s) are due for refill today - yes  Requested medication (s) are on the active medication list -yes  Future visit scheduled -no  Last refill: 1 month  Notes to clinic: Request RF of medication prescribed by outside provider  Requested Prescriptions  Pending Prescriptions Disp Refills   ferrous sulfate 325 (65 FE) MG tablet 30 tablet 0    Sig: Take 1 tablet (325 mg total) by mouth every other day.      Endocrinology:  Minerals - Iron Supplementation Failed - 03/01/2021 10:06 AM      Failed - HGB in normal range and within 360 days    Hemoglobin  Date Value Ref Range Status  02/20/2021 8.1 (L) 13.0 - 17.7 g/dL Final          Failed - HCT in normal range and within 360 days    Hematocrit  Date Value Ref Range Status  02/20/2021 25.3 (L) 37.5 - 51.0 % Final          Failed - RBC in normal range and within 360 days    RBC  Date Value Ref Range Status  02/20/2021 2.73 (LL) 4.14 - 5.80 x10E6/uL Final  01/05/2021 2.55 (L) 4.22 - 5.81 MIL/uL Final          Failed - Fe (serum) in normal range and within 360 days    Iron  Date Value Ref Range Status  02/20/2021 22 (L) 38 - 169 ug/dL Final   Iron Saturation  Date Value Ref Range Status  02/20/2021 9 (LL) 15 - 55 % Final          Passed - Ferritin in normal range and within 360 days    Ferritin  Date Value Ref Range Status  02/20/2021 58 30 - 400 ng/mL Final          Passed - Valid encounter within last 12 months    Recent Outpatient Visits           1 month ago Lower GI bleed   [facility], Vickki Muff, PA-C   4 months ago Benign hypertension   [facility], Vickki Muff, PA-C   1 year ago Benign hypertension   [facility], Vickki Muff, PA-C   2 years ago Benign hypertension   [facility], Vickki Muff, PA-C   2 years ago Benign hypertension   [facility], Vickki Muff, PA-C                     Requested Prescriptions  Pending Prescriptions Disp Refills   ferrous sulfate 325 (65 FE) MG tablet 30 tablet 0    Sig: Take 1 tablet (325 mg total) by mouth every other day.      Endocrinology:  Minerals - Iron Supplementation Failed - 03/01/2021 10:06 AM      Failed - HGB in normal range and within 360 days    Hemoglobin  Date Value Ref Range Status  02/20/2021 8.1 (L) 13.0 - 17.7 g/dL Final          Failed - HCT in normal range and within 360 days    Hematocrit  Date Value Ref Range Status  02/20/2021 25.3 (L) 37.5 - 51.0 % Final          Failed - RBC in normal range and within 360 days    RBC  Date Value Ref Range Status  02/20/2021 2.73 (LL) 4.14 - 5.80 x10E6/uL Final  01/05/2021 2.55 (L) 4.22 - 5.81 MIL/uL Final          Failed - Fe (serum) in normal range and within 360 days    Iron  Date Value Ref Range Status  02/20/2021 22 (L) 38 - 169 ug/dL Final   Iron Saturation  Date Value Ref Range Status  02/20/2021 9 (LL) 15 - 55 % Final          Passed - Ferritin in normal range and within 360 days    Ferritin  Date Value Ref Range Status  02/20/2021 58 30 - 400 ng/mL Final          Passed - Valid encounter within last 12 months    Recent Outpatient Visits           1 month ago Lower GI bleed   [place], Vickki Muff, PA-C   4 months ago Benign hypertension   [facility], Vickki Muff, PA-C   1 year ago Benign hypertension   [facility], Vickki Muff, PA-C   2 years ago Benign hypertension   [facility], Vickki Muff, PA-C   2 years ago Benign hypertension   [facility], Vickki Muff, [place]

## 2021-03-12 ENCOUNTER — Other Ambulatory Visit: Payer: Self-pay | Admitting: Family Medicine

## 2021-03-12 NOTE — Telephone Encounter (Signed)
Requested Prescriptions  Pending Prescriptions Disp Refills  . allopurinol (ZYLOPRIM) 100 MG tablet [Pharmacy Med Name: ALLOPURINOL 100 MG TAB] 30 tablet 3    Sig: TAKE 1/2 TABLET BY MOUTH ONCE DAILY     Endocrinology:  Gout Agents Failed - 03/12/2021 11:48 AM      Failed - Cr in normal range and within 360 days    Creat  Date Value Ref Range Status  10/22/2017 3.09 (H) 0.70 - 1.11 mg/dL Final    Comment:    Verified by repeat analysis. . For patients >36 years of age, the reference limit for Creatinine is approximately 13% higher for people identified as African-American. .    Creatinine, Ser  Date Value Ref Range Status  02/20/2021 3.24 (H) 0.76 - 1.27 mg/dL Final    Comment:                   **Effective February 25, 2021 [facility] will begin**                  reporting the 2021 CKD-EPI creatinine equation that                  estimates kidney function without a race variable.          Passed - Uric Acid in normal range and within 360 days    Uric Acid, Serum  Date Value Ref Range Status  01/03/2021 7.5 3.7 - 8.6 mg/dL Final    Comment:    Performed at [facility], [address] [place], [place] [address]   Uric Acid  Date Value Ref Range Status  02/02/2020 6.2 3.8 - 8.4 mg/dL Final    Comment:               Therapeutic target for gout patients: <6.0         Passed - Valid encounter within last 12 months    Recent Outpatient Visits          1 month ago Lower GI bleed   [place], PA-C   4 months ago Benign hypertension   [facility], Vickki Muff, PA-C   1 year ago Benign hypertension   [facility], Vickki Muff, PA-C   2 years ago Benign hypertension   [facility], Vickki Muff, PA-C   2 years ago Benign hypertension   [facility], Vickki Muff, [place]

## 2021-05-09 ENCOUNTER — Other Ambulatory Visit: Payer: Self-pay | Admitting: Family Medicine

## 2021-05-09 ENCOUNTER — Other Ambulatory Visit: Payer: Self-pay | Admitting: Physician Assistant

## 2021-05-09 DIAGNOSIS — E039 Hypothyroidism, unspecified: Secondary | ICD-10-CM

## 2021-05-09 NOTE — Telephone Encounter (Signed)
Requested medication (s) are due for refill today: Yes  Requested medication (s) are on the active medication list: Yes  Last refill:  10/01/20  Future visit scheduled: No  Notes to clinic: Unable to refill per protocol, cannot delegate. .     Requested Prescriptions  Pending Prescriptions Disp Refills   amiodarone (PACERONE) 200 MG tablet [Pharmacy Med Name: AMIODARONE HCL 200 MG TAB] 90 tablet 1    Sig: TAKE 1 TABLET BY MOUTH ONCE DAILY      Not Delegated - Cardiovascular: Antiarrhythmic Agents - amiodarone Failed - 05/09/2021  4:40 PM      Failed - This refill cannot be delegated      Failed - TSH in normal range and within 180 days    TSH  Date Value Ref Range Status  02/20/2021 12.100 (H) 0.450 - 4.500 uIU/mL Final          Failed - Mg Level in normal range and within 180 days    No results found for: MG        Failed - Ca in normal range and within 180 days    Calcium  Date Value Ref Range Status  02/20/2021 8.5 (L) 8.6 - 10.2 mg/dL Final   Calcium, Total  Date Value Ref Range Status  05/10/2014 8.7 8.5 - 10.1 mg/dL Final          Failed - Patient had ECG in the last 180 days      Failed - Last BP in normal range    BP Readings from Last 1 Encounters:  01/22/21 (!) 158/67          Passed - K in normal range and within 180 days    Potassium  Date Value Ref Range Status  02/20/2021 5.1 3.5 - 5.2 mmol/L Final  05/10/2014 4.6 3.5 - 5.1 mmol/L Final          Passed - AST in normal range and within 180 days    AST  Date Value Ref Range Status  02/20/2021 24 0 - 40 IU/L Final          Passed - ALT in normal range and within 180 days    ALT  Date Value Ref Range Status  01/22/2021 7 0 - 44 IU/L Final          Passed - Last Heart Rate in normal range    Pulse Readings from Last 1 Encounters:  01/22/21 70          Passed - Valid encounter within last 6 months    Recent Outpatient Visits           3 months ago Lower GI bleed   [employer], Vickki Muff, PA-C   6 months ago Benign hypertension   [facility], Vickki Muff, PA-C   1 year ago Benign hypertension   [facility], Vickki Muff, PA-C   2 years ago Benign hypertension   [facility], Vickki Muff, PA-C   2 years ago Benign hypertension   [facility], Vickki Muff, PA-C                 Signed Prescriptions Disp Refills   traZODone (DESYREL) 50 MG tablet 90 tablet 0    Sig: TAKE 1 TABLET BY MOUTH AT BEDTIME      Psychiatry: Antidepressants - Serotonin Modulator Passed - 05/09/2021  4:40 PM      Passed - Valid encounter  within last 6 months    Recent Outpatient Visits           3 months ago Lower GI bleed   [place], PA-C   6 months ago Benign hypertension   [facility], Vickki Muff, PA-C   1 year ago Benign hypertension   [facility], Vickki Muff, PA-C   2 years ago Benign hypertension   [facility], Vickki Muff, PA-C   2 years ago Benign hypertension   [facility], Vickki Muff, PA-C                  amLODipine (NORVASC) 5 MG tablet 90 tablet 0    Sig: TAKE 1 TABLET BY MOUTH ONCE DAILY      Cardiovascular:  Calcium Channel Blockers Failed - 05/09/2021  4:40 PM      Failed - Last BP in normal range    BP Readings from Last 1 Encounters:  01/22/21 (!) 158/67          Passed - Valid encounter within last 6 months    Recent Outpatient Visits           3 months ago Lower GI bleed   [place], PA-C   6 months ago Benign hypertension   [facility], Vickki Muff, PA-C   1 year ago Benign hypertension   [facility], Vickki Muff, PA-C   2 years ago Benign hypertension   [facility], Vickki Muff, PA-C   2 years ago Benign  hypertension   [facility], Vickki Muff, [place]

## 2021-05-10 ENCOUNTER — Other Ambulatory Visit: Payer: Self-pay | Admitting: Family Medicine

## 2021-07-29 ENCOUNTER — Other Ambulatory Visit: Payer: Self-pay | Admitting: Family Medicine

## 2021-07-29 DIAGNOSIS — E039 Hypothyroidism, unspecified: Secondary | ICD-10-CM

## 2021-08-26 ENCOUNTER — Telehealth: Payer: Self-pay

## 2021-08-26 NOTE — Telephone Encounter (Signed)
Attempted to cal daughter [name] to schedule AWV by Phone for patient. Last AWV 03/2017. Advised on vm to call me at [phone] and let me know a good time in eveing to schedule this nurse visit for this free AWV.

## 2021-09-17 ENCOUNTER — Encounter: Payer: Self-pay | Admitting: Family Medicine

## 2021-09-26 ENCOUNTER — Other Ambulatory Visit: Payer: Self-pay | Admitting: Family Medicine

## 2021-09-26 DIAGNOSIS — E039 Hypothyroidism, unspecified: Secondary | ICD-10-CM

## 2021-09-26 MED ORDER — AMLODIPINE BESYLATE 5 MG PO TABS: 5.0000 mg | ORAL_TABLET | Freq: Every day | ORAL | 0 refills | Status: DC

## 2021-09-26 MED ORDER — LEVOTHYROXINE SODIUM 112 MCG PO TABS: ORAL_TABLET | ORAL | 0 refills | Status: DC

## 2021-09-26 MED ORDER — TRAZODONE HCL 50 MG PO TABS: 50.0000 mg | ORAL_TABLET | Freq: Every day | ORAL | 0 refills | Status: DC

## 2021-09-26 MED ORDER — LEVOTHYROXINE SODIUM 112 MCG PO TABS
ORAL_TABLET | ORAL | 0 refills | Status: DC
Start: 2021-09-26 — End: 2021-09-26

## 2021-09-26 NOTE — Addendum Note (Signed)
Addended by: Matilde Sprang on: 09/26/2021 04:13 PM   Modules accepted: Orders

## 2021-09-26 NOTE — Addendum Note (Signed)
Addended by: Matilde Sprang on: 09/26/2021 04:08 PM   Modules accepted: Orders

## 2021-09-27 ENCOUNTER — Other Ambulatory Visit: Payer: Self-pay | Admitting: Family Medicine

## 2021-09-27 MED ORDER — TRAZODONE HCL 50 MG PO TABS: 50.0000 mg | ORAL_TABLET | Freq: Every day | ORAL | 0 refills | Status: DC

## 2021-09-27 MED ORDER — AMLODIPINE BESYLATE 5 MG PO TABS: 5.0000 mg | ORAL_TABLET | Freq: Every day | ORAL | 0 refills | Status: DC

## 2021-09-27 MED ORDER — LEVOTHYROXINE SODIUM 112 MCG PO TABS: ORAL_TABLET | ORAL | 0 refills | Status: DC

## 2021-09-27 NOTE — Telephone Encounter (Signed)
Resent into the pharmacy.

## 2021-09-27 NOTE — Addendum Note (Signed)
Addended by: Wilburt Finlay on: 09/27/2021 11:27 AM   Modules accepted: Orders

## 2021-10-18 ENCOUNTER — Ambulatory Visit: Payer: Medicare PPO | Admitting: Podiatry

## 2021-10-22 ENCOUNTER — Ambulatory Visit (INDEPENDENT_AMBULATORY_CARE_PROVIDER_SITE_OTHER): Payer: Medicare PPO | Admitting: Family Medicine

## 2021-10-22 ENCOUNTER — Telehealth: Payer: Self-pay | Admitting: Physician Assistant

## 2021-10-22 ENCOUNTER — Other Ambulatory Visit: Payer: Self-pay

## 2021-10-22 VITALS — BP 194/94 | HR 54 | Temp 97.6°F

## 2021-10-22 DIAGNOSIS — C61 Malignant neoplasm of prostate: Secondary | ICD-10-CM

## 2021-10-22 DIAGNOSIS — Z23 Encounter for immunization: Secondary | ICD-10-CM

## 2021-10-22 DIAGNOSIS — I739 Peripheral vascular disease, unspecified: Secondary | ICD-10-CM

## 2021-10-22 DIAGNOSIS — I10 Essential (primary) hypertension: Secondary | ICD-10-CM

## 2021-10-22 DIAGNOSIS — I471 Supraventricular tachycardia: Secondary | ICD-10-CM

## 2021-10-22 DIAGNOSIS — M109 Gout, unspecified: Secondary | ICD-10-CM | POA: Diagnosis not present

## 2021-10-22 DIAGNOSIS — D508 Other iron deficiency anemias: Secondary | ICD-10-CM | POA: Diagnosis not present

## 2021-10-22 DIAGNOSIS — E039 Hypothyroidism, unspecified: Secondary | ICD-10-CM

## 2021-10-22 DIAGNOSIS — R202 Paresthesia of skin: Secondary | ICD-10-CM

## 2021-10-22 DIAGNOSIS — D649 Anemia, unspecified: Secondary | ICD-10-CM

## 2021-10-22 DIAGNOSIS — E782 Mixed hyperlipidemia: Secondary | ICD-10-CM

## 2021-10-22 DIAGNOSIS — N183 Chronic kidney disease, stage 3 unspecified: Secondary | ICD-10-CM

## 2021-10-22 MED ORDER — COLCHICINE-PROBENECID 0.5-500 MG PO TABS: 1.0000 | ORAL_TABLET | Freq: Every day | ORAL | 3 refills | Status: DC

## 2021-10-22 MED ORDER — AMOXICILLIN-POT CLAVULANATE 875-125 MG PO TABS: 1.0000 | ORAL_TABLET | Freq: Two times a day (BID) | ORAL | 0 refills | Status: DC

## 2021-10-22 MED ORDER — AMLODIPINE BESYLATE 5 MG PO TABS: 5.0000 mg | ORAL_TABLET | Freq: Every day | ORAL | 3 refills | Status: DC

## 2021-10-22 NOTE — Telephone Encounter (Signed)
Advised daughter that we have referred him to the vascular office and they will decide what needs to be done.

## 2021-10-22 NOTE — Progress Notes (Signed)
Acute Office Visit  Subjective:    Patient ID: Glenn Jarvis., male    DOB: 1927/09/15, 87 y.o.   MRN: [id number]  No chief complaint on file.   HPI Patient is in today for evaluation of gout.  His daughter is present with him.  She reports he has had this flare for a few weeks but has not wanted to go to the doctor.  She states it has been 3 weeks.  He has prescription for Allopurinol but it is uncertain if he has been taking it.   Today his left ankle has been swollen, red, tender and warm to the touch.  He is in good spirits and overall feels fairly well.  He does not seek out medical care very often. He has chronic renal failure with his last GFR being 15.  This was done at the first of this year. He states he takes his medications as prescribed.  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 Past Medical History:  Diagnosis Date   Arthritis    GERD (gastroesophageal reflux disease)    Hemorrhoid 1986   Hyperlipidemia    Hypertension    SVT (supraventricular tachycardia) ([place])    Thyroid disease    Vertigo     Past Surgical History:  Procedure Laterality Date   APPENDECTOMY  1968   BACK SURGERY     COLONOSCOPY N/A 11/27/2017   Procedure: COLONOSCOPY;  Surgeon: Toledo, Benay Pike, MD;  Location: [facility] ENDOSCOPY;  Service: Gastroenterology;  Laterality: N/A;   COLONOSCOPY WITH PROPOFOL N/A 01/04/2021   Procedure: COLONOSCOPY WITH PROPOFOL;  Surgeon: Lucilla Lame, MD;  Location: [facility] ENDOSCOPY;  Service: Endoscopy;  Laterality: N/A;   ESOPHAGOGASTRODUODENOSCOPY (EGD) WITH PROPOFOL N/A 11/25/2017   Procedure: ESOPHAGOGASTRODUODENOSCOPY (EGD) WITH PROPOFOL;  Surgeon: Toledo, Benay Pike, MD;  Location: [facility] ENDOSCOPY;  Service: Gastroenterology;  Laterality: N/A;   EXCISIONAL HEMORRHOIDECTOMY     INGUINAL HERNIA REPAIR Right 03/17/2016   Procedure: LAPAROSCOPIC  INGUINAL HERNIA;  Surgeon: Florene Glen, MD;  Location: [facility] ORS;  Service: General;  Laterality: Right;   [place]    Family History  Problem Relation Age of Onset   Alzheimer's disease Father    Healthy Sister     Social History   Socioeconomic History   Marital status: Widowed    Spouse name: Not on file   Number of children: Not on file   Years of education: Not on file   Highest education level: Not on file  Occupational History   Occupation: retired  Tobacco Use   Smoking status: Former    Packs/day: 2.00    Years: 25.00    Pack years: 50.00    Types: Cigarettes    Quit date: 12/28/1965    Years since quitting: 55.8   Smokeless tobacco: Never  Vaping Use   Vaping Use: Never used  Substance and Sexual Activity   Alcohol use: No    Comment: 1 beer occasionally   Drug use: No   Sexual activity: Not Currently  Other Topics Concern   Not on file  Social History Narrative   Not on file   Social Determinants of Health   Financial Resource Strain: Not on file  Food Insecurity: Not on file  Transportation Needs: Not on file  Physical Activity: Not on file  Stress: Not on file  Social Connections: Not on file  Intimate Partner Violence: Not on file    Outpatient Medications Prior to Visit  Medication Sig Dispense Refill   allopurinol (ZYLOPRIM) 100 MG tablet TAKE 1/2 TABLET BY MOUTH ONCE DAILY 30 tablet 3   amiodarone (PACERONE) 200 MG tablet Take 1 tablet (200 mg total) by mouth daily. 90 tablet 1   amLODipine (NORVASC) 5 MG tablet Take 1 tablet (5 mg total) by mouth daily. 90 tablet 0   ferrous sulfate 325 (65 FE) MG tablet Take 1 tablet (325 mg total) by mouth every other day. 30 tablet 0   levothyroxine (SYNTHROID) 112 MCG tablet TAKE 1 TABLET BY MOUTH ONCE DAILY ON AN EMPTY STOMACH. WAIT 30 MINUTES BEFORE TAKING OTHER MEDS. 90 tablet 0   Multiple Vitamins-Iron (MULTIVITAMINS WITH IRON) TABS tablet Take 1 tablet by mouth daily. 90 tablet 1    pantoprazole (PROTONIX) 40 MG tablet TAKE 1 TABLET BY MOUTH ONCE DAILY 90 tablet 3   traZODone (DESYREL) 50 MG tablet Take 1 tablet (50 mg total) by mouth at bedtime. 90 tablet 0   No facility-administered medications prior to visit.    Allergies  Allergen Reactions   Codeine    Cortizone-10  [Hydrocortisone]     Review of Systems  Musculoskeletal:  Positive for arthralgias, gait problem and joint swelling. Negative for back pain and myalgias.      Objective:    Physical Exam Vitals reviewed.  Constitutional:      General: He is not in acute distress.    Appearance: He is well-developed.     Comments: He appears younger than his age of 63.  HENT:     Head: Normocephalic and atraumatic.     Right Ear: Hearing normal.     Left Ear: Hearing normal.     Nose: Nose normal.  Eyes:     General: Lids are normal. No scleral icterus.       Right eye: No discharge.        Left eye: No discharge.     Conjunctiva/sclera: Conjunctivae normal.  Cardiovascular:     Rate and Rhythm: Normal rate and regular rhythm.     Heart sounds: Normal heart sounds.     Comments: I have had difficulty finding DP or PT pulses Pulmonary:     Effort: Pulmonary effort is normal. No respiratory distress.     Breath sounds: Normal breath sounds.  Musculoskeletal:        General: Swelling present.     Cervical back: Neck supple.     Comments: He has some swelling of the left foot, particularly the distal portion with some erythema warmth and tenderness.  There is also skin breakdown dorsum of the foot with no breakdown between the toes.  There is no foul smell to the foot.  Skin:    Findings: No lesion or rash.  Neurological:     Mental Status: He is alert and oriented to person, place, and time.  Psychiatric:        Mood and Affect: Mood normal.        Speech: Speech normal.        Behavior: Behavior normal.        Thought Content: Thought content normal.    BP (!) 194/94 (BP Location: Right Arm,  Patient Position: Sitting, Cuff Size: Normal)   Pulse (!) 54   Temp 97.6 F (36.4 C) (Oral)   SpO2 100%  Wt Readings from Last 3 Encounters:  01/22/21  136 lb (61.7 kg)  01/05/21 165 lb (74.8 kg)  01/04/21 138 lb 14.2 oz (63 kg)    Health Maintenance Due  Topic Date Due   Pneumonia Vaccine 24+ Years old (1 - PCV) Never done   Zoster Vaccines- Shingrix (1 of 2) Never done   COVID-19 Vaccine (4 - Booster for Pfizer series) 02/27/2021   INFLUENZA VACCINE  07/29/2021    There are no preventive care reminders to display for this patient.   Lab Results  Component Value Date   TSH 12.100 (H) 02/20/2021   Lab Results  Component Value Date   WBC 6.9 02/20/2021   HGB 8.1 (L) 02/20/2021   HCT 25.3 (L) 02/20/2021   MCV 93 02/20/2021   PLT 128 (L) 02/20/2021   Lab Results  Component Value Date   NA 138 02/20/2021   K 5.1 02/20/2021   CO2 22 01/22/2021   GLUCOSE 82 02/20/2021   BUN 26 02/20/2021   CREATININE 3.24 (H) 02/20/2021   BILITOT 0.3 02/20/2021   ALKPHOS 94 02/20/2021   AST 24 02/20/2021   ALT 7 01/22/2021   PROT 6.6 02/20/2021   ALBUMIN 3.3 (L) 02/20/2021   CALCIUM 8.5 (L) 02/20/2021   ANIONGAP 8 01/05/2021   Lab Results  Component Value Date   CHOL 176 10/15/2020   Lab Results  Component Value Date   HDL 59 10/15/2020   Lab Results  Component Value Date   LDLCALC 107 (H) 10/15/2020   Lab Results  Component Value Date   TRIG 50 10/15/2020   Lab Results  Component Value Date   CHOLHDL 3.2 04/02/2017   No results found for: HGBA1C     Assessment & Plan:   Problem List Items Addressed This Visit   None  1. Gout of multiple sites, unspecified cause, unspecified chronicity Probable gout of the foot and ankle.  At this time we will go for his simple treatment of colchicine daily.  May need prednisone.  This may actually be also cellulitis with possible PAD. - Uric acid - amoxicillin-clavulanate (AUGMENTIN) 875-125 MG tablet; Take 1 tablet by mouth  2 (two) times daily.  Dispense: 20 tablet; Refill: 0  2. Need for influenza vaccination  - Flu Vaccine QUAD High Dose(Fluad)  3. Iron deficiency anemia secondary to inadequate dietary iron intake  - CBC with Differential/Platelet - Comprehensive metabolic panel - Iron, TIBC and Ferritin Panel  4. Acute gout of left foot, unspecified cause As above. - colchicine-probenecid 0.5-500 MG tablet; Take 1 tablet by mouth daily.  Dispense: 90 tablet; Refill: 3  5. PVD (peripheral vascular disease) ([place]) I think a vascular evaluation at this time will be appropriate.  He has no PAD then to proceed with treatment without worried about limb viability. - Ambulatory referral to Vascular Surgery  6. Benign hypertension Patient tolerated hypertension.  Try amlodipine 5 mg daily - amLODipine (NORVASC) 5 MG tablet; Take 1 tablet (5 mg total) by mouth daily.  Dispense: 90 tablet; Refill: 3  7. Accelerated hypertension   8. PSVT (paroxysmal supraventricular tachycardia) (HCC)   9. Stage 3 chronic kidney disease, unspecified whether stage 3a or 3b CKD ([place]) Patient has end-stage renal disease now.  Nephrology referral probably appropriate.  I am not sure how much this nice gentleman is willing to do at age 62. Chronic care management consultation probably be appropriate to help patient and family navigate of life care. 10. Normocytic anemia   11. H/o CA of prostate ([place])   12. Mixed  hyperlipidemia On no meds.  13. Adult hypothyroidism Synthroid 112  14. Leg paresthesia      I, Wilhemena Durie, MD, have reviewed all documentation for this visit. The documentation on 10/27/21 for the exam, diagnosis, procedures, and orders are all accurate and complete.  Juluis Mire, CMA

## 2021-10-22 NOTE — Telephone Encounter (Signed)
Patients daughter called in wanting to know if patient is gonna have an ultrasound done. Please call back

## 2021-10-23 LAB — COMPREHENSIVE METABOLIC PANEL
ALT: 9 IU/L (ref 0–44)
AST: 19 IU/L (ref 0–40)
Albumin/Globulin Ratio: 1.1 — ABNORMAL LOW (ref 1.2–2.2)
Albumin: 3.4 g/dL — ABNORMAL LOW (ref 3.5–4.6)
Alkaline Phosphatase: 103 IU/L (ref 44–121)
BUN/Creatinine Ratio: 13 (ref 10–24)
BUN: 42 mg/dL — ABNORMAL HIGH (ref 10–36)
Bilirubin Total: 0.3 mg/dL (ref 0.0–1.2)
CO2: 20 mmol/L (ref 20–29)
Calcium: 8.6 mg/dL (ref 8.6–10.2)
Chloride: 108 mmol/L — ABNORMAL HIGH (ref 96–106)
Creatinine, Ser: 3.26 mg/dL — ABNORMAL HIGH (ref 0.76–1.27)
Globulin, Total: 3.2 g/dL (ref 1.5–4.5)
Glucose: 87 mg/dL (ref 70–99)
Potassium: 4.9 mmol/L (ref 3.5–5.2)
Sodium: 141 mmol/L (ref 134–144)
Total Protein: 6.6 g/dL (ref 6.0–8.5)
eGFR: 17 mL/min/{1.73_m2} — ABNORMAL LOW (ref >59)

## 2021-10-23 LAB — IRON,TIBC AND FERRITIN PANEL
Ferritin: 75 ng/mL (ref 30–400)
Iron Saturation: 14 % — ABNORMAL LOW (ref 15–55)
Iron: 35 ug/dL — ABNORMAL LOW (ref 38–169)
Total Iron Binding Capacity: 254 ug/dL (ref 250–450)
UIBC: 219 ug/dL (ref 111–343)

## 2021-10-23 LAB — CBC WITH DIFFERENTIAL/PLATELET
Basophils Absolute: 0 10*3/uL (ref 0.0–0.2)
Basos: 0 %
EOS (ABSOLUTE): 0.1 10*3/uL (ref 0.0–0.4)
Eos: 1 %
Hematocrit: 30.6 % — ABNORMAL LOW (ref 37.5–51.0)
Hemoglobin: 10 g/dL — ABNORMAL LOW (ref 13.0–17.7)
Immature Grans (Abs): 0 10*3/uL (ref 0.0–0.1)
Immature Granulocytes: 0 %
Lymphocytes Absolute: 2.1 10*3/uL (ref 0.7–3.1)
Lymphs: 31 %
MCH: 29.9 pg (ref 26.6–33.0)
MCHC: 32.7 g/dL (ref 31.5–35.7)
MCV: 92 fL (ref 79–97)
Monocytes Absolute: 0.5 10*3/uL (ref 0.1–0.9)
Monocytes: 7 %
Neutrophils Absolute: 4.2 10*3/uL (ref 1.4–7.0)
Neutrophils: 61 %
Platelets: 116 10*3/uL — ABNORMAL LOW (ref 150–450)
RBC: 3.34 x10E6/uL — ABNORMAL LOW (ref 4.14–5.80)
RDW: 13.9 % (ref 11.6–15.4)
WBC: 6.9 10*3/uL (ref 3.4–10.8)

## 2021-10-23 LAB — URIC ACID: Uric Acid: 7.9 mg/dL (ref 3.8–8.4)

## 2021-10-31 ENCOUNTER — Other Ambulatory Visit: Payer: Self-pay | Admitting: *Deleted

## 2021-10-31 DIAGNOSIS — E782 Mixed hyperlipidemia: Secondary | ICD-10-CM

## 2021-10-31 DIAGNOSIS — N183 Chronic kidney disease, stage 3 unspecified: Secondary | ICD-10-CM

## 2021-10-31 DIAGNOSIS — I10 Essential (primary) hypertension: Secondary | ICD-10-CM

## 2021-10-31 DIAGNOSIS — E039 Hypothyroidism, unspecified: Secondary | ICD-10-CM

## 2021-11-01 ENCOUNTER — Telehealth: Payer: Self-pay | Admitting: *Deleted

## 2021-11-01 NOTE — Chronic Care Management (AMB) (Signed)
  Chronic Care Management   Outreach Note  11/01/2021 Name: [name] MRN: [id number] DOB: 09/05/1927  Glenn Mulling Sr. is a 87 y.o. year old male who is a primary care patient of Mikey Kirschner, [place]. I reached out to Glenn Mulling Sr. by phone today in response to a referral sent by Mr. Terrell Ostrand Sr.'s primary care provider.  An unsuccessful telephone outreach was attempted today. The patient was referred to the case management team for assistance with care management and care coordination.   Follow Up Plan: A HIPAA compliant phone message was left for the patient providing contact information and requesting a return call.  If patient returns call to provider office, please advise to call Embedded Care Management Care Guide Bricia Taher at [place], [place] [facility]  Direct Dial: [phone]

## 2021-11-04 NOTE — Chronic Care Management (AMB) (Signed)
  Chronic Care Management   Outreach Note  11/04/2021 Name: Glenn Cheek Sr. MRN: [id number] DOB: 1927/08/28  Glenn Mulling Sr. is a 87 y.o. year old male who is a primary care patient of Mikey Kirschner, [place]. I reached out to Glenn Mulling Sr. by phone today in response to a referral sent by Mr. Glenn Arterberry Sr.'s primary care provider.  A second unsuccessful telephone outreach was attempted today. The patient was referred to the case management team for assistance with care management and care coordination.   Follow Up Plan: A HIPAA compliant phone message was left for the patient providing contact information and requesting a return call.  If patient returns call to provider office, please advise to call Embedded Care Management Care Guide [name] at [place], [place] [facility]  Direct Dial: [phone]

## 2021-11-07 ENCOUNTER — Other Ambulatory Visit: Payer: Self-pay

## 2021-11-07 ENCOUNTER — Encounter: Payer: Self-pay | Admitting: Physician Assistant

## 2021-11-07 ENCOUNTER — Ambulatory Visit (INDEPENDENT_AMBULATORY_CARE_PROVIDER_SITE_OTHER): Payer: Medicare PPO | Admitting: Physician Assistant

## 2021-11-07 ENCOUNTER — Ambulatory Visit: Payer: Medicare PPO | Admitting: Physician Assistant

## 2021-11-07 VITALS — BP 193/99 | HR 64 | Ht 72.0 in | Wt 127.5 lb

## 2021-11-07 DIAGNOSIS — L03116 Cellulitis of left lower limb: Secondary | ICD-10-CM | POA: Diagnosis not present

## 2021-11-07 DIAGNOSIS — M109 Gout, unspecified: Secondary | ICD-10-CM | POA: Diagnosis not present

## 2021-11-07 DIAGNOSIS — I10 Essential (primary) hypertension: Secondary | ICD-10-CM

## 2021-11-07 NOTE — Progress Notes (Signed)
Established patient visit   Patient: Glenn Shaker Sr.   DOB: 02-15-1927   87 y.o. Male  MRN: [id number] Visit Date: 11/07/2021  Today's healthcare provider: Mikey Kirschner, PA-C   Cc. Follow up left foot/ankle cellulitis  Subjective    HPI  Glenn Jarvis is a 87 y/o male who presents today for follow up of pain and erythema of the left foot. He reports taking the amoxicillin for a few days and felt better, so he stopped taking it. He is still taking the colchicine. Reports overall improvement of pain, swelling, and redness in his foot. He is able to bare some weight now.  He has a follow up appointment schedule w/ a vascular surgeon in December.   Medications: Outpatient Medications Prior to Visit  Medication Sig   allopurinol (ZYLOPRIM) 100 MG tablet TAKE 1/2 TABLET BY MOUTH ONCE DAILY   amiodarone (PACERONE) 200 MG tablet Take 1 tablet (200 mg total) by mouth daily.   amLODipine (NORVASC) 5 MG tablet Take 1 tablet (5 mg total) by mouth daily.   amLODipine (NORVASC) 5 MG tablet Take 1 tablet (5 mg total) by mouth daily.   amoxicillin-clavulanate (AUGMENTIN) 875-125 MG tablet Take 1 tablet by mouth 2 (two) times daily.   colchicine-probenecid 0.5-500 MG tablet Take 1 tablet by mouth daily.   ferrous sulfate 325 (65 FE) MG tablet Take 1 tablet (325 mg total) by mouth every other day.   levothyroxine (SYNTHROID) 112 MCG tablet TAKE 1 TABLET BY MOUTH ONCE DAILY ON AN EMPTY STOMACH. WAIT 30 MINUTES BEFORE TAKING OTHER MEDS.   Multiple Vitamins-Iron (MULTIVITAMINS WITH IRON) TABS tablet Take 1 tablet by mouth daily.   pantoprazole (PROTONIX) 40 MG tablet TAKE 1 TABLET BY MOUTH ONCE DAILY   traZODone (DESYREL) 50 MG tablet Take 1 tablet (50 mg total) by mouth at bedtime.   No facility-administered medications prior to visit.    Review of Systems  Respiratory:  Negative for shortness of breath.   Cardiovascular:  Negative for chest pain.  Musculoskeletal:  Positive for gait problem.   Skin:  Negative for color change.  All other systems reviewed and are negative.  Last CBC Lab Results  Component Value Date   WBC 6.9 10/22/2021   HGB 10.0 (L) 10/22/2021   HCT 30.6 (L) 10/22/2021   MCV 92 10/22/2021   MCH 29.9 10/22/2021   RDW 13.9 10/22/2021   PLT 116 (L) 84/66/5993   Last metabolic panel Lab Results  Component Value Date   GLUCOSE 87 10/22/2021   NA 141 10/22/2021   K 4.9 10/22/2021   CL 108 (H) 10/22/2021   CO2 20 10/22/2021   BUN 42 (H) 10/22/2021   CREATININE 3.26 (H) 10/22/2021   EGFR 17 (L) 10/22/2021   CALCIUM 8.6 10/22/2021   PROT 6.6 10/22/2021   ALBUMIN 3.4 (L) 10/22/2021   LABGLOB 3.2 10/22/2021   AGRATIO 1.1 (L) 10/22/2021   BILITOT 0.3 10/22/2021   ALKPHOS 103 10/22/2021   AST 19 10/22/2021   ALT 9 10/22/2021   ANIONGAP 8 01/05/2021   Last lipids Lab Results  Component Value Date   CHOL 176 10/15/2020   HDL 59 10/15/2020   LDLCALC 107 (H) 10/15/2020   TRIG 50 10/15/2020   CHOLHDL 3.2 04/02/2017   Last hemoglobin A1c No results found for: HGBA1C Last thyroid functions Lab Results  Component Value Date   TSH 12.100 (H) 02/20/2021   T3TOTAL 81 05/26/2017   T4TOTAL 5.4 02/02/2020   Last  vitamin D No results found for: Davy Pique, VD25OH Last vitamin B12 and Folate Lab Results  Component Value Date   VITAMINB12 801 04/01/2018   FOLATE 24.0 01/21/2018       Objective    BP (!) 193/99 (BP Location: Right Arm, Patient Position: Sitting, Cuff Size: Large)   Pulse 64   Ht 6' (1.829 m)   Wt 127 lb 8 oz (57.8 kg)   SpO2 100%   BMI 17.29 kg/m  BP Readings from Last 3 Encounters:  11/07/21 (!) 193/99  10/22/21 (!) 194/94  01/22/21 (!) 158/67   Wt Readings from Last 3 Encounters:  11/07/21 127 lb 8 oz (57.8 kg)  01/22/21 136 lb (61.7 kg)  01/05/21 165 lb (74.8 kg)      Physical Exam Constitutional:      Appearance: He is ill-appearing.  Cardiovascular:     Pulses:          Dorsalis pedis pulses are  2+ on the left side.  Pulmonary:     Effort: Pulmonary effort is normal.  Feet:     Left foot:     Skin integrity: Ulcer and skin breakdown present. No erythema.     Toenail Condition: Left toenails are normal.     Comments: Ulcer underneath left first and second toes, crusted over, non weeping. No longer as edematous, not erythematous, no signs of cellulitis today. Nontender.  Skin:    General: Skin is warm.  Neurological:     Mental Status: He is alert.     No results found for any visits on 11/07/21.  Assessment & Plan     Left foot cellulitis vs gout Advised pt to continue to take antibiotics until he is finished Continue taking Colchicine as prescribed. Will f/u with vascular in December-- if erythema and pain recur, please call office.  2. HTN Pt started amlodpine 5 mg two weeks ago, no improvement. Instructed patient to take 10 mg-- take two pills.  F/u 1 mo  Return in about 1 month (around 12/07/2021) for hypertension.      I, Mikey Kirschner, PA-C have reviewed all documentation for this visit. The documentation on  11/07/2021  for the exam, diagnosis, procedures, and orders are all accurate and complete.    Mikey Kirschner, PA-C  [facility] [phone] (phone) [phone] (fax)  [place]

## 2021-11-11 NOTE — Chronic Care Management (AMB) (Signed)
  Chronic Care Management   Outreach Note  11/11/2021 Name: Glenn Snee Sr. MRN: [id number] DOB: 1927/06/27  [name] is a 87 y.o. year old male who is a primary care patient of Mikey Kirschner, [place]. I reached out to [name] by phone today in response to a referral sent by Mr. Jorgen Wolfinger Sr.'s primary care provider.  Third unsuccessful telephone outreach was attempted today. The patient was referred to the case management team for assistance with care management and care coordination. The patient's primary care provider has been notified of our unsuccessful attempts to make or maintain contact with the patient. The care management team is pleased to engage with this patient at any time in the future should he/she be interested in assistance from the care management team.   Follow Up Plan: We have been unable to make contact with the patient for follow up. The care management team is available to follow up with the patient after provider conversation with the patient regarding recommendation for care management engagement and subsequent re-referral to the care management team.   Julian Hy, [place] Management  Direct Dial: [phone]

## 2021-11-27 ENCOUNTER — Other Ambulatory Visit (INDEPENDENT_AMBULATORY_CARE_PROVIDER_SITE_OTHER): Payer: Self-pay | Admitting: Nurse Practitioner

## 2021-11-27 DIAGNOSIS — I739 Peripheral vascular disease, unspecified: Secondary | ICD-10-CM

## 2021-11-29 ENCOUNTER — Encounter (INDEPENDENT_AMBULATORY_CARE_PROVIDER_SITE_OTHER): Payer: Medicare PPO

## 2021-11-29 ENCOUNTER — Encounter (INDEPENDENT_AMBULATORY_CARE_PROVIDER_SITE_OTHER): Payer: Medicare PPO | Admitting: Nurse Practitioner

## 2021-12-10 NOTE — Progress Notes (Deleted)
Established patient visit   Patient: Glenn Antonson Sr.   DOB: Oct 03, 1927   87 y.o. Male  MRN: [id number] Visit Date: 12/11/2021  Today's healthcare provider: Mikey Kirschner, PA-C   No chief complaint on file.  Subjective    HPI  Hypertension, follow-up  BP Readings from Last 3 Encounters:  11/07/21 (!) 193/99  10/22/21 (!) 194/94  01/22/21 (!) 158/67   Wt Readings from Last 3 Encounters:  11/07/21 127 lb 8 oz (57.8 kg)  01/22/21 136 lb (61.7 kg)  01/05/21 165 lb (74.8 kg)     He was last seen for hypertension 1 months ago.  BP at that visit was 193/99. Management since that visit includes increased Amlodipine to 33m.  He reports {excellent/good/fair/poor:19665} compliance with treatment. He {is/is not:9024} having side effects. {document side effects if present:1} He is following a {diet:21022986} diet. He {is/is not:9024} exercising.  Outside blood pressures are {***enter patient reported home BP readings, or 'not being checked':1}. Symptoms: {Yes/No:20286} chest pain {Yes/No:20286} chest pressure  {Yes/No:20286} palpitations {Yes/No:20286} syncope  {Yes/No:20286} dyspnea {Yes/No:20286} orthopnea  {Yes/No:20286} paroxysmal nocturnal dyspnea {Yes/No:20286} lower extremity edema   Pertinent labs: Lab Results  Component Value Date   CHOL 176 10/15/2020   HDL 59 10/15/2020   LDLCALC 107 (H) 10/15/2020   TRIG 50 10/15/2020   CHOLHDL 3.2 04/02/2017   Lab Results  Component Value Date   NA 141 10/22/2021   K 4.9 10/22/2021   CREATININE 3.26 (H) 10/22/2021   EGFR 17 (L) 10/22/2021   GLUCOSE 87 10/22/2021   TSH 12.100 (H) 02/20/2021     The ASCVD Risk score (Arnett DK, et al., 2019) failed to calculate for the following reasons:   The 2019 ASCVD risk score is only valid for ages 459to 717  ---------------------------------------------------------------------------------------------------   {Link to patient history deactivated due to formatting  error:1}  Medications: Outpatient Medications Prior to Visit  Medication Sig   allopurinol (ZYLOPRIM) 100 MG tablet TAKE 1/2 TABLET BY MOUTH ONCE DAILY   amiodarone (PACERONE) 200 MG tablet Take 1 tablet (200 mg total) by mouth daily.   amLODipine (NORVASC) 5 MG tablet Take 1 tablet (5 mg total) by mouth daily.   amoxicillin-clavulanate (AUGMENTIN) 875-125 MG tablet Take 1 tablet by mouth 2 (two) times daily.   colchicine-probenecid 0.5-500 MG tablet Take 1 tablet by mouth daily.   ferrous sulfate 325 (65 FE) MG tablet Take 1 tablet (325 mg total) by mouth every other day.   levothyroxine (SYNTHROID) 112 MCG tablet TAKE 1 TABLET BY MOUTH ONCE DAILY ON AN EMPTY STOMACH. WAIT 30 MINUTES BEFORE TAKING OTHER MEDS.   Multiple Vitamins-Iron (MULTIVITAMINS WITH IRON) TABS tablet Take 1 tablet by mouth daily.   pantoprazole (PROTONIX) 40 MG tablet TAKE 1 TABLET BY MOUTH ONCE DAILY   traZODone (DESYREL) 50 MG tablet Take 1 tablet (50 mg total) by mouth at bedtime.   No facility-administered medications prior to visit.    Review of Systems  {Labs   Heme   Chem   Endocrine   Serology   Results Review (optional):23779}   Objective    There were no vitals taken for this visit. {Show previous vital signs (optional):23777}  Physical Exam  ***  No results found for any visits on 12/11/21.  Assessment & Plan     ***  No follow-ups on file.      {provider attestation***:1}   LMikey Kirschner PA-C  [phone](phone) [phone](fax)  [facility]  °

## 2021-12-11 ENCOUNTER — Ambulatory Visit: Payer: Medicare PPO | Admitting: Physician Assistant

## 2022-01-17 ENCOUNTER — Ambulatory Visit (INDEPENDENT_AMBULATORY_CARE_PROVIDER_SITE_OTHER): Payer: Medicare PPO | Admitting: Nurse Practitioner

## 2022-01-17 ENCOUNTER — Ambulatory Visit (INDEPENDENT_AMBULATORY_CARE_PROVIDER_SITE_OTHER): Payer: Medicare PPO

## 2022-01-17 ENCOUNTER — Encounter (INDEPENDENT_AMBULATORY_CARE_PROVIDER_SITE_OTHER): Payer: Self-pay | Admitting: Nurse Practitioner

## 2022-01-17 ENCOUNTER — Other Ambulatory Visit: Payer: Self-pay

## 2022-01-17 VITALS — BP 132/88 | HR 98 | Resp 15 | Wt 113.0 lb

## 2022-01-17 DIAGNOSIS — E782 Mixed hyperlipidemia: Secondary | ICD-10-CM | POA: Diagnosis not present

## 2022-01-17 DIAGNOSIS — M1990 Unspecified osteoarthritis, unspecified site: Secondary | ICD-10-CM | POA: Diagnosis not present

## 2022-01-17 DIAGNOSIS — I739 Peripheral vascular disease, unspecified: Secondary | ICD-10-CM

## 2022-01-18 ENCOUNTER — Encounter (INDEPENDENT_AMBULATORY_CARE_PROVIDER_SITE_OTHER): Payer: Self-pay | Admitting: Nurse Practitioner

## 2022-01-18 NOTE — Progress Notes (Signed)
Subjective:    Patient ID: Glenn Mulling Sr., male    DOB: [birth year], 88 y.o.   MRN: [id number] Chief Complaint  Patient presents with   New Patient (Initial Visit)    Ref Rosanna Randy PVD consult    Glenn Jarvis is a 88 year old male that presents today as a referral from Dr. Rosanna Randy in regards to concern for cold feet.  The patient denies any significant claudication-like symptoms or rest pain.  There is a small wound on his right second toe.  The patient notes that the wound happened spontaneously.  He had previously been having some pain and discomfort in his feet however he was treated for gap had helped all the pain he is experiencing.  He does note that his feet frequently feel cold.  The patient has ABI 1.19 on the right and 1.02 on the left.  He has a TBI 0.78 on the left and 0.85 on the right.  He has triphasic/monophasic waveforms on the right with triphasic/biphasic on the left with normal toe waveforms bilaterally.   Review of Systems  Cardiovascular:  Positive for leg swelling.  Skin:  Positive for wound.  All other systems reviewed and are negative.     Objective:   Physical Exam Vitals reviewed.  HENT:     Head: Normocephalic.  Cardiovascular:     Rate and Rhythm: Normal rate.     Pulses:          Dorsalis pedis pulses are detected w/ Doppler on the right side and detected w/ Doppler on the left side.       Posterior tibial pulses are detected w/ Doppler on the right side and detected w/ Doppler on the left side.  Pulmonary:     Effort: Pulmonary effort is normal.  Musculoskeletal:       Feet:  Skin:    General: Skin is dry.     Capillary Refill: Capillary refill takes 2 to 3 seconds.  Neurological:     Mental Status: He is alert and oriented to person, place, and time.     Gait: Gait abnormal.  Psychiatric:        Mood and Affect: Mood normal.        Behavior: Behavior normal.        Thought Content: Thought content normal.        Judgment: Judgment normal.     BP 132/88 (BP Location: Left Arm)    Pulse 98    Resp 15    Wt 113 lb (51.3 kg)    BMI 15.33 kg/m   Past Medical History:  Diagnosis Date   Arthritis    GERD (gastroesophageal reflux disease)    Hemorrhoid 1986   Hyperlipidemia    Hypertension    SVT (supraventricular tachycardia) (HCC)    Thyroid disease    Vertigo     Social History   Socioeconomic History   Marital status: Widowed    Spouse name: Not on file   Number of children: Not on file   Years of education: Not on file   Highest education level: Not on file  Occupational History   Occupation: retired  Tobacco Use   Smoking status: Former    Packs/day: 2.00    Years: 25.00    Pack years: 50.00    Types: Cigarettes    Quit date: 12/28/1965    Years since quitting: 56.0   Smokeless tobacco: Never  Vaping Use   Vaping Use: Never used  Substance  and Sexual Activity   Alcohol use: No    Comment: 1 beer occasionally   Drug use: No   Sexual activity: Not Currently  Other Topics Concern   Not on file  Social History Narrative   Not on file   Social Determinants of Health   Financial Resource Strain: Not on file  Food Insecurity: Not on file  Transportation Needs: Not on file  Physical Activity: Not on file  Stress: Not on file  Social Connections: Not on file  Intimate Partner Violence: Not on file    Past Surgical History:  Procedure Laterality Date   [place] N/A 11/27/2017   Procedure: COLONOSCOPY;  Surgeon: Toledo, Benay Pike, MD;  Location: [facility] ENDOSCOPY;  Service: Gastroenterology;  Laterality: N/A;   COLONOSCOPY WITH PROPOFOL N/A 01/04/2021   Procedure: COLONOSCOPY WITH PROPOFOL;  Surgeon: Lucilla Lame, MD;  Location: [facility] ENDOSCOPY;  Service: Endoscopy;  Laterality: N/A;   ESOPHAGOGASTRODUODENOSCOPY (EGD) WITH PROPOFOL N/A 11/25/2017   Procedure: ESOPHAGOGASTRODUODENOSCOPY (EGD) WITH PROPOFOL;  Surgeon: Toledo, Benay Pike, MD;  Location: [facility]  ENDOSCOPY;  Service: Gastroenterology;  Laterality: N/A;   EXCISIONAL HEMORRHOIDECTOMY     INGUINAL HERNIA REPAIR Right 03/17/2016   Procedure: LAPAROSCOPIC INGUINAL HERNIA;  Surgeon: Florene Glen, MD;  Location: [facility] ORS;  Service: General;  Laterality: Right;   [place]    Family History  Problem Relation Age of Onset   Alzheimer's disease Father    Healthy Sister     Allergies  Allergen Reactions   Codeine    Cortizone-10  [Hydrocortisone]     CBC Latest Ref Rng & Units 10/22/2021 02/20/2021 01/22/2021  WBC 3.4 - 10.8 x10E3/uL 6.9 6.9 8.5  Hemoglobin 13.0 - 17.7 g/dL 10.0(L) 8.1(L) 7.4(L)  Hematocrit 37.5 - 51.0 % 30.6(L) 25.3(L) 22.6(L)  Platelets 150 - 450 x10E3/uL 116(L) 128(L) 154      CMP     Component Value Date/Time   NA 141 10/22/2021 1417   NA 140 05/10/2014 2244   K 4.9 10/22/2021 1417   K 4.6 05/10/2014 2244   CL 108 (H) 10/22/2021 1417   CL 109 (H) 05/10/2014 2244   CO2 20 10/22/2021 1417   CO2 23 05/10/2014 2244   GLUCOSE 87 10/22/2021 1417   GLUCOSE 106 (H) 01/05/2021 1044   GLUCOSE 120 (H) 05/10/2014 2244   BUN 42 (H) 10/22/2021 1417   BUN 30 (H) 05/10/2014 2244   CREATININE 3.26 (H) 10/22/2021 1417   CREATININE 3.09 (H) 10/22/2017 1333   CALCIUM 8.6 10/22/2021 1417   CALCIUM 8.7 05/10/2014 2244   PROT 6.6 10/22/2021 1417   ALBUMIN 3.4 (L) 10/22/2021 1417   AST 19 10/22/2021 1417   ALT 9 10/22/2021 1417   ALKPHOS 103 10/22/2021 1417   BILITOT 0.3 10/22/2021 1417   GFRNONAA 16 (L) 02/20/2021 1528   GFRNONAA 17 (L) 01/05/2021 1044   GFRNONAA 17 (L) 10/22/2017 1333   GFRAA 18 (L) 02/20/2021 1528   GFRAA 20 (L) 10/22/2017 1333     No results found.     Assessment & Plan:   1. PVD (peripheral vascular disease) ([place]) Today the patient's noninvasive studies do not indicate significant PAD however he does have decreased capillary refill.  The patient may have a slight component of microvascular disease.  Due to the recent GI  bleed the patient has had will not add aspirin or any other antiplatelet regimen.  We will have the patient  return in 4 weeks with no studies to evaluate the small wound that is currently on his left toe to ensure its wound healing.  If this wound heals without issue we will continue with conservative follow-up. 2. Mixed hyperlipidemia Continue statin as ordered and reviewed, no changes at this time   3. Arthritis Continue NSAID medications as already ordered, these medications have been reviewed and there are no changes at this time.  Continued activity and therapy was stressed.    Current Outpatient Medications on File Prior to Visit  Medication Sig Dispense Refill   allopurinol (ZYLOPRIM) 100 MG tablet TAKE 1/2 TABLET BY MOUTH ONCE DAILY 30 tablet 3   amiodarone (PACERONE) 200 MG tablet Take 1 tablet (200 mg total) by mouth daily. 90 tablet 1   amLODipine (NORVASC) 5 MG tablet Take 1 tablet (5 mg total) by mouth daily. 90 tablet 3   colchicine-probenecid 0.5-500 MG tablet Take 1 tablet by mouth daily. 90 tablet 3   ferrous sulfate 325 (65 FE) MG tablet Take 1 tablet (325 mg total) by mouth every other day. 30 tablet 0   levothyroxine (SYNTHROID) 112 MCG tablet TAKE 1 TABLET BY MOUTH ONCE DAILY ON AN EMPTY STOMACH. WAIT 30 MINUTES BEFORE TAKING OTHER MEDS. 90 tablet 0   Multiple Vitamins-Iron (MULTIVITAMINS WITH IRON) TABS tablet Take 1 tablet by mouth daily. 90 tablet 1   pantoprazole (PROTONIX) 40 MG tablet TAKE 1 TABLET BY MOUTH ONCE DAILY 90 tablet 3   traZODone (DESYREL) 50 MG tablet Take 1 tablet (50 mg total) by mouth at bedtime. 90 tablet 0   amoxicillin-clavulanate (AUGMENTIN) 875-125 MG tablet Take 1 tablet by mouth 2 (two) times daily. (Patient not taking: Reported on 01/17/2022) 20 tablet 0   No current facility-administered medications on file prior to visit.    There are no Patient Instructions on file for this visit. No follow-ups on file.   Kris Hartmann, NP

## 2022-02-14 ENCOUNTER — Other Ambulatory Visit: Payer: Self-pay

## 2022-02-14 ENCOUNTER — Ambulatory Visit (INDEPENDENT_AMBULATORY_CARE_PROVIDER_SITE_OTHER): Payer: Medicare PPO | Admitting: Vascular Surgery

## 2022-02-14 ENCOUNTER — Encounter (INDEPENDENT_AMBULATORY_CARE_PROVIDER_SITE_OTHER): Payer: Self-pay | Admitting: Vascular Surgery

## 2022-02-14 VITALS — BP 195/95 | HR 71 | Resp 17 | Ht 72.0 in | Wt 121.0 lb

## 2022-02-14 DIAGNOSIS — I10 Essential (primary) hypertension: Secondary | ICD-10-CM | POA: Diagnosis not present

## 2022-02-14 DIAGNOSIS — L97511 Non-pressure chronic ulcer of other part of right foot limited to breakdown of skin: Secondary | ICD-10-CM | POA: Diagnosis not present

## 2022-02-14 DIAGNOSIS — E782 Mixed hyperlipidemia: Secondary | ICD-10-CM | POA: Diagnosis not present

## 2022-02-14 DIAGNOSIS — L97519 Non-pressure chronic ulcer of other part of right foot with unspecified severity: Secondary | ICD-10-CM | POA: Insufficient documentation

## 2022-02-14 NOTE — Assessment & Plan Note (Signed)
blood pressure control important in reducing the progression of atherosclerotic disease. On appropriate oral medications.  

## 2022-02-14 NOTE — Assessment & Plan Note (Signed)
Tiny scab residual.  Seems to be healing well.  Recommended he follow-up with the podiatrist.  We will see him back in 6 months with repeat ABIs.

## 2022-02-14 NOTE — Assessment & Plan Note (Signed)
lipid control important in reducing the progression of atherosclerotic disease. Continue statin therapy  

## 2022-02-14 NOTE — Progress Notes (Signed)
MRN : [id number]  Glenn Heaphy Sr. is a 88 y.o. (1927/08/08) male who presents with chief complaint of  Chief Complaint  Patient presents with   Follow-up    4 week f/u no studies  .  History of Present Illness: Patient returns today in follow up of his toe ulceration. This is healing well.  Does have some swelling in his legs.  Recently had ABIs which were okay.  No pain.  Current Outpatient Medications  Medication Sig Dispense Refill   allopurinol (ZYLOPRIM) 100 MG tablet TAKE 1/2 TABLET BY MOUTH ONCE DAILY 30 tablet 3   amiodarone (PACERONE) 200 MG tablet Take 1 tablet (200 mg total) by mouth daily. 90 tablet 1   amLODipine (NORVASC) 5 MG tablet Take 1 tablet (5 mg total) by mouth daily. 90 tablet 3   colchicine-probenecid 0.5-500 MG tablet Take 1 tablet by mouth daily. 90 tablet 3   ferrous sulfate 325 (65 FE) MG tablet Take 1 tablet (325 mg total) by mouth every other day. 30 tablet 0   levothyroxine (SYNTHROID) 112 MCG tablet TAKE 1 TABLET BY MOUTH ONCE DAILY ON AN EMPTY STOMACH. WAIT 30 MINUTES BEFORE TAKING OTHER MEDS. 90 tablet 0   Multiple Vitamins-Iron (MULTIVITAMINS WITH IRON) TABS tablet Take 1 tablet by mouth daily. 90 tablet 1   pantoprazole (PROTONIX) 40 MG tablet TAKE 1 TABLET BY MOUTH ONCE DAILY 90 tablet 3   traZODone (DESYREL) 50 MG tablet Take 1 tablet (50 mg total) by mouth at bedtime. 90 tablet 0   amoxicillin-clavulanate (AUGMENTIN) 875-125 MG tablet Take 1 tablet by mouth 2 (two) times daily. (Patient not taking: Reported on 02/14/2022) 20 tablet 0   No current facility-administered medications for this visit.    Past Medical History:  Diagnosis Date   Arthritis    GERD (gastroesophageal reflux disease)    Hemorrhoid 1986   Hyperlipidemia    Hypertension    SVT (supraventricular tachycardia) ([place])    Thyroid disease    Vertigo     Past Surgical History:  Procedure Laterality Date   APPENDECTOMY  1968   BACK SURGERY     COLONOSCOPY N/A  11/27/2017   Procedure: COLONOSCOPY;  Surgeon: Toledo, Benay Pike, MD;  Location: [facility] ENDOSCOPY;  Service: Gastroenterology;  Laterality: N/A;   COLONOSCOPY WITH PROPOFOL N/A 01/04/2021   Procedure: COLONOSCOPY WITH PROPOFOL;  Surgeon: Lucilla Lame, MD;  Location: [facility] ENDOSCOPY;  Service: Endoscopy;  Laterality: N/A;   ESOPHAGOGASTRODUODENOSCOPY (EGD) WITH PROPOFOL N/A 11/25/2017   Procedure: ESOPHAGOGASTRODUODENOSCOPY (EGD) WITH PROPOFOL;  Surgeon: Toledo, Benay Pike, MD;  Location: [facility] ENDOSCOPY;  Service: Gastroenterology;  Laterality: N/A;   EXCISIONAL HEMORRHOIDECTOMY     INGUINAL HERNIA REPAIR Right 03/17/2016   Procedure: LAPAROSCOPIC INGUINAL HERNIA;  Surgeon: Florene Glen, MD;  Location: [facility] ORS;  Service: General;  Laterality: Right;   Gray     Social History   Tobacco Use   Smoking status: Former    Packs/day: 2.00    Years: 25.00    Pack years: 50.00    Types: Cigarettes    Quit date: 12/28/1965    Years since quitting: 56.1   Smokeless tobacco: Never  Vaping Use   Vaping Use: Never used  Substance Use Topics   Alcohol use: No    Comment: 1 beer occasionally   Drug use: No      Family History  Problem Relation Age of Onset   Alzheimer's disease Father    Healthy Sister  Allergies  Allergen Reactions   Codeine    Cortizone-10  [Hydrocortisone]      REVIEW OF SYSTEMS (Negative unless checked)  Constitutional: [] Weight loss  [] Fever  [] Chills Cardiac: [] Chest pain   [] Chest pressure   [] Palpitations   [] Shortness of breath when laying flat   [] Shortness of breath at rest   [] Shortness of breath with exertion. Vascular:  [] Pain in legs with walking   [] Pain in legs at rest   [] Pain in legs when laying flat   [] Claudication   [] Pain in feet when walking  [] Pain in feet at rest  [] Pain in feet when laying flat   [] History of DVT   [] Phlebitis   [x] Swelling in legs   [] Varicose veins   [x] Non-healing ulcers Pulmonary:   [] Uses home  oxygen   [] Productive cough   [] Hemoptysis   [] Wheeze  [] COPD   [] Asthma Neurologic:  [] Dizziness  [] Blackouts   [] Seizures   [] History of stroke   [] History of TIA  [] Aphasia   [] Temporary blindness   [] Dysphagia   [] Weakness or numbness in arms   [] Weakness or numbness in legs Musculoskeletal:  [] Arthritis   [] Joint swelling   [] Joint pain   [] Low back pain Hematologic:  [] Easy bruising  [] Easy bleeding   [] Hypercoagulable state   [] Anemic   Gastrointestinal:  [] Blood in stool   [] Vomiting blood  [] Gastroesophageal reflux/heartburn   [] Abdominal pain Genitourinary:  [] Chronic kidney disease   [] Difficult urination  [] Frequent urination  [] Burning with urination   [] Hematuria Skin:  [] Rashes   [x] Ulcers   [x] Wounds Psychological:  [] History of anxiety   []  History of major depression.  Physical Examination  BP (!) 195/95 (BP Location: Right Arm)    Pulse 71    Resp 17    Ht 6' (1.829 m)    Wt 121 lb (54.9 kg)    BMI 16.41 kg/m  Gen:  WD/WN, NAD. Appears younger than stated age. Head: [place]/AT, No temporalis wasting. Ear/Nose/Throat: Hearing grossly intact, nares w/o erythema or drainage Eyes: Conjunctiva clear. Sclera non-icteric Neck: Supple.  Trachea midline Pulmonary:  Good air movement, no use of accessory muscles.  Cardiac: RRR, no JVD Vascular:  Vessel Right Left  Radial Palpable Palpable                          PT Palpable Palpable  DP Palpable Palpable   Gastrointestinal: soft, non-tender/non-distended. No guarding/reflex.  Musculoskeletal: M/S 5/5 throughout.  No deformity or atrophy. Trace LE edema. Neurologic: Sensation grossly intact in extremities.  Symmetrical.  Speech is fluent.  Psychiatric: Judgment intact, Mood & affect appropriate for pt's clinical situation. Dermatologic: tiny scab on the right second toe      Labs No results found for this or any previous visit (from the past 2160 hour(s)).  Radiology VAS [place] ABI WITH/WO TBI  Result Date: 01/20/2022   LOWER EXTREMITY DOPPLER STUDY Patient Name:  Glenn Jarvis  Date of Exam:   01/17/2022 Medical Rec #: [id number]        Accession #:    [id number] Date of Birth: 1927-11-16         Patient Gender: M Patient Age:   54 years Exam Location:  [facility] Procedure:      VAS [place] ABI WITH/WO TBI Referring Phys: Hortencia Pilar --------------------------------------------------------------------------------  Indications: Cold Feet, Pulses difficult to feel at the level of the Ankle.  Performing Technologist: Almira Coaster RVS  Examination Guidelines: A  complete evaluation includes at minimum, Doppler waveform signals and systolic blood pressure reading at the level of bilateral brachial, anterior tibial, and posterior tibial arteries, when vessel segments are accessible. Bilateral testing is considered an integral part of a complete examination. Photoelectric Plethysmograph (PPG) waveforms and toe systolic pressure readings are included as required and additional duplex testing as needed. Limited examinations for reoccurring indications may be performed as noted.  ABI Findings: +---------+------------------+-----+----------+--------+  Right     Rt Pressure (mmHg) Index Waveform   Comment   +---------+------------------+-----+----------+--------+  Brachial  133                                           +---------+------------------+-----+----------+--------+  PTA       164                1.19  triphasic            +---------+------------------+-----+----------+--------+  PERO      125                0.91  monophasic           +---------+------------------+-----+----------+--------+  Great Toe 108                0.78  Normal               +---------+------------------+-----+----------+--------+ +---------+------------------+-----+---------+-------+  Left      Lt Pressure (mmHg) Index Waveform  Comment  +---------+------------------+-----+---------+-------+  Brachial  138                                          +---------+------------------+-----+---------+-------+  PTA       141                1.02  triphasic          +---------+------------------+-----+---------+-------+  PERO      116                0.87  biphasic           +---------+------------------+-----+---------+-------+  Great Toe 117                0.85  Normal             +---------+------------------+-----+---------+-------+ +-------+-----------+-----------+------------+------------+  ABI/TBI Today's ABI Today's TBI Previous ABI Previous TBI  +-------+-----------+-----------+------------+------------+  Right   1.19        .78                                    +-------+-----------+-----------+------------+------------+  Left    1.02        .85                                    +-------+-----------+-----------+------------+------------+  Summary: Right: Resting right ankle-brachial index is within normal range. No evidence of significant right lower extremity arterial disease. The right toe-brachial index is normal. Left: Resting left ankle-brachial index is within normal range. No evidence of significant left lower extremity arterial disease. The left toe-brachial index is normal.  *See table(s) above for measurements and observations.  Electronically signed by Hortencia Pilar MD  on 01/20/2022 at 5:06:41 PM.    Final     Assessment/Plan  Ulcer of right second toe (HCC) Tiny scab residual.  Seems to be healing well.  Recommended he follow-up with the podiatrist.  We will see him back in 6 months with repeat ABIs.  HLD (hyperlipidemia) lipid control important in reducing the progression of atherosclerotic disease. Continue statin therapy   Benign hypertension blood pressure control important in reducing the progression of atherosclerotic disease. On appropriate oral medications.    Leotis Pain, MD  02/14/2022 11:46 AM    This note was created with Dragon medical transcription system.  Any errors from dictation are purely unintentional

## 2022-02-15 ENCOUNTER — Other Ambulatory Visit: Payer: Self-pay | Admitting: Family Medicine

## 2022-02-17 ENCOUNTER — Telehealth: Payer: Self-pay | Admitting: Physician Assistant

## 2022-02-17 MED ORDER — AMIODARONE HCL 200 MG PO TABS: 200.0000 mg | ORAL_TABLET | Freq: Every day | ORAL | 0 refills | Status: DC

## 2022-02-17 NOTE — Telephone Encounter (Signed)
Requested medication (s) are due for refill today: expired medication  Requested medication (s) are on the active medication list: yes  Last refill:  trazadone - 09/27/21 #90 0 refills , protonix-02/11/21 #90 3 refills   Future visit scheduled: no  Notes to clinic:  do you want to refill Rx? Protonix expired. Do you want to renew Rx?     Requested Prescriptions  Pending Prescriptions Disp Refills   traZODone (DESYREL) 50 MG tablet [Pharmacy Med Name: TRAZODONE HCL 50 MG TAB] 90 tablet 0    Sig: TAKE 1 TABLET BY MOUTH AT BEDTIME     Psychiatry: Antidepressants - Serotonin Modulator Passed - 02/15/2022 11:57 AM      Passed - Valid encounter within last 6 months    Recent Outpatient Visits           3 months ago Acute gout of left foot, unspecified cause   [facility] [place], [place], PA-C   3 months ago Gout of multiple sites, unspecified cause, unspecified chronicity   [facility] Jer[place] Banana., MD   1 year ago Lower GI bleed   [place], Vickki Muff, PA-C   1 year ago Benign hypertension   [facility], Vickki Muff, PA-C   2 years ago Benign hypertension   [facility], Vickki Muff, PA-C               pantoprazole (PROTONIX) 40 MG tablet [Pharmacy Med Name: PANTOPRAZOLE SODIUM 40 MG DR TAB] 90 tablet 3    Sig: TAKE 1 TABLET BY MOUTH ONCE DAILY     Gastroenterology: Proton Pump Inhibitors Passed - 02/15/2022 11:57 AM      Passed - Valid encounter within last 12 months    Recent Outpatient Visits           3 months ago Acute gout of left foot, unspecified cause   [facility] [place], [place], PA-C   3 months ago Gout of multiple sites, unspecified cause, unspecified chronicity   [facility] Jerrol Banana., MD   1 year ago Lower GI bleed   [place], Vickki Muff, PA-C   1 year ago Benign hypertension    [facility], Vickki Muff, PA-C   2 years ago Benign hypertension   [facility], Vickki Muff, [place]

## 2022-02-17 NOTE — Telephone Encounter (Signed)
[facility] faxed refill request for the following medications:  amiodarone (PACERONE) 200 MG tablet   Please advise.

## 2022-02-19 NOTE — Telephone Encounter (Signed)
Pt has called back in re to Drubel's suggestion to have father see a cardiologist, states that if history of father's high bp was reviewed would see it always runs very high 200/200 at times,  (clarified stat) but not  right now. States never been referred by prev dr. Does not think needs cardio dr but would need to provide a referral if that is dr opinion. Wants a conversation to clarify to make sure history reviewed in full . FU with Jolanda at [phone] to review.

## 2022-02-20 ENCOUNTER — Other Ambulatory Visit: Payer: Self-pay

## 2022-02-20 DIAGNOSIS — I10 Essential (primary) hypertension: Secondary | ICD-10-CM

## 2022-03-20 ENCOUNTER — Other Ambulatory Visit: Payer: Self-pay | Admitting: Family Medicine

## 2022-03-20 ENCOUNTER — Other Ambulatory Visit: Payer: Self-pay | Admitting: Physician Assistant

## 2022-03-20 DIAGNOSIS — E039 Hypothyroidism, unspecified: Secondary | ICD-10-CM

## 2022-03-27 ENCOUNTER — Ambulatory Visit: Payer: Self-pay

## 2022-03-27 ENCOUNTER — Telehealth: Payer: Self-pay | Admitting: Physician Assistant

## 2022-03-27 ENCOUNTER — Encounter: Payer: Self-pay | Admitting: Physician Assistant

## 2022-03-27 ENCOUNTER — Ambulatory Visit: Payer: Medicare PPO | Admitting: Family Medicine

## 2022-03-27 ENCOUNTER — Other Ambulatory Visit: Payer: Self-pay

## 2022-03-27 ENCOUNTER — Ambulatory Visit
Admission: RE | Admit: 2022-03-27 | Discharge: 2022-03-27 | Disposition: A | Discharge status: Home / Self Care | Payer: Medicare PPO | Attending: Physician Assistant | Admitting: Physician Assistant

## 2022-03-27 ENCOUNTER — Ambulatory Visit (INDEPENDENT_AMBULATORY_CARE_PROVIDER_SITE_OTHER): Payer: Medicare PPO | Admitting: Physician Assistant

## 2022-03-27 ENCOUNTER — Ambulatory Visit
Admission: RE | Admit: 2022-03-27 | Discharge: 2022-03-27 | Disposition: A | Discharge status: Home / Self Care | Payer: Medicare PPO | Source: Ambulatory Visit | Attending: Physician Assistant | Admitting: Physician Assistant

## 2022-03-27 VITALS — BP 148/96 | HR 106 | Temp 97.5°F

## 2022-03-27 DIAGNOSIS — R609 Edema, unspecified: Secondary | ICD-10-CM

## 2022-03-27 DIAGNOSIS — I130 Hypertensive heart and chronic kidney disease with heart failure and stage 1 through stage 4 chronic kidney disease, or unspecified chronic kidney disease: Secondary | ICD-10-CM | POA: Diagnosis not present

## 2022-03-27 DIAGNOSIS — R0602 Shortness of breath: Secondary | ICD-10-CM

## 2022-03-27 DIAGNOSIS — R0603 Acute respiratory distress: Secondary | ICD-10-CM

## 2022-03-27 DIAGNOSIS — I10 Essential (primary) hypertension: Secondary | ICD-10-CM

## 2022-03-27 DIAGNOSIS — R9431 Abnormal electrocardiogram [ECG] [EKG]: Secondary | ICD-10-CM

## 2022-03-27 MED ORDER — DOXYCYCLINE HYCLATE 100 MG PO TABS: 100.0000 mg | ORAL_TABLET | Freq: Two times a day (BID) | ORAL | 0 refills | Status: DC

## 2022-03-27 MED ORDER — FUROSEMIDE 40 MG PO TABS: 40.0000 mg | ORAL_TABLET | Freq: Every day | ORAL | 3 refills | Status: DC

## 2022-03-27 NOTE — Progress Notes (Signed)
?  ? ? ?I,Roque Schill Robinson,acting as a Education administrator for [employer], PA-C.,have documented all relevant documentation on the behalf of Glenn Speak, PA-C,as directed by  [employer], PA-C while in the presence of [employer], PA-C. ? ?Established patient visit ? ? ?Patient: Glenn Lips Sr.   DOB: 1927/07/04   88 y.o. Male  MRN: [id number] ?Visit Date: 03/27/2022 ? ?Today's healthcare provider: [name], PA-C  ? ?Chief Complaint  ?Patient presents with  ? Leg Swelling  ? Respiratory Distress  ? ?Subjective  ? ?Patient is accompanied by his son and daughter Glenn Jarvis who is helping with history. ?Patient presents for knee and leg swelling and pain x "long time."   States the right knee is so painful he cannot stand on it. Denies any trauma, or fall. Does not recall how he got a bruise on his knee. He has trouble with ringing in his ears and ear itching which is most bothersome problem for him today. ?Pt reports difficulty breathing when lying down on back since last night.  Reports he cannot stay on his back and has to sit up. Denies chest pain or pressure.  No able to sleep.   ? ?SHORTNESS OF BREATH ?Onset: started 2-3 days ago, ?Frequency: constant ?Related to exertion: yes ?Cough: some, he spat a tinge of a little blood with cough this morning. ?Chest tightness: no ?Wheezing: yes ?Fevers: no ?Chest pain: no ?Palpitations: no  ?Nausea: no ?Diaphoresis: no ?Deconditioning: no, he has been always very active ?Status: worse ?Aggravating factors: exertion ?Alleviating factors: sitting and resting ?Treatments attempted: none ? ?LEG SWELLING ?Chronic problem ?Location: ankle ?New medications: unknown pill (per daughter) was taken and leg swelling returns, per daughter statement ? ?History of Liver problems: no ?History of cancer: prostate cancer, resection was done 20 years ago ? ?Chest pain: no ?Immobility: no ?Black or bloody bowel movements: no ?Severe snoring or day time sleepiness: "a little bit" ?Abdomen  swelling: no ?History of blood clots: denies ?Weight Loss: no ? ?ROS see HPI ?Smoking Status noted, he was a smoker in his 68s ? ?He was seen by vascular surgery for PVD last month,  and a month prior, was stable on his current medications and scheduled for fu in 6 mo ? ?  ? ?Medications: ?Outpatient Medications Prior to Visit  ?Medication Sig  ? allopurinol (ZYLOPRIM) 100 MG tablet TAKE 1/2 TABLET BY MOUTH ONCE DAILY  ? amiodarone (PACERONE) 200 MG tablet Take 1 tablet (200 mg total) by mouth daily. Please schedule office visit with cardiology.  ? amLODipine (NORVASC) 5 MG tablet Take 1 tablet (5 mg total) by mouth daily.  ? amoxicillin-clavulanate (AUGMENTIN) 875-125 MG tablet Take 1 tablet by mouth 2 (two) times daily.  ? colchicine-probenecid 0.5-500 MG tablet Take 1 tablet by mouth daily.  ? ferrous sulfate 325 (65 FE) MG tablet Take 1 tablet (325 mg total) by mouth every other day.  ? levothyroxine (SYNTHROID) 112 MCG tablet TAKE 1 TABLET BY MOUTH ONCE DAILY ON AN EMPTY STOMACH. WAIT 30 MINUTES BEFORE TAKING OTHER MEDS.  ? Multiple Vitamins-Iron (MULTIVITAMINS WITH IRON) TABS tablet Take 1 tablet by mouth daily.  ? pantoprazole (PROTONIX) 40 MG tablet Take 1 tablet (40 mg total) by mouth daily.  ? traZODone (DESYREL) 50 MG tablet Take 1 tablet (50 mg total) by mouth at bedtime. Please schedule office visit before any future refill.  ? ?No facility-administered medications prior to visit.  ? ? ?Review of Systems  ?Constitutional:  Positive for activity  change and fatigue. Negative for diaphoresis and fever.  ?HENT:  Positive for tinnitus (chronic).   ?Eyes:  Negative for photophobia and visual disturbance.  ?Respiratory:  Positive for cough, shortness of breath and wheezing. Negative for chest tightness.   ?Cardiovascular:  Positive for leg swelling (chronic). Negative for chest pain and palpitations.  ?Gastrointestinal: Negative.   ?Neurological: Negative.   ?Psychiatric/Behavioral: Negative.    ? ? ?   Objective  ?  ?BP (!) 148/96 (BP Location: Right Arm, Patient Position: Sitting, Cuff Size: Normal)   Pulse (!) 106   Temp (!) 97.5 ?F (36.4 ?C) (Oral)   SpO2 96%  ? ? ?Physical Exam ?Vitals and nursing note reviewed.  ?Constitutional:   ?   Appearance: Normal appearance. He is toxic-appearing.  ?HENT:  ?   Head: Normocephalic and atraumatic.  ?   Right Ear: There is impacted cerumen.  ?   Left Ear: There is impacted cerumen.  ?   Mouth/Throat:  ?   Pharynx: No posterior oropharyngeal erythema.  ?Eyes:  ?   Extraocular Movements: Extraocular movements intact.  ?   Pupils: Pupils are equal, round, and reactive to light.  ?Cardiovascular:  ?   Rate and Rhythm: Tachycardia present.  ?   Pulses: Normal pulses.  ?   Heart sounds: Normal heart sounds.  ?Pulmonary:  ?   Effort: Pulmonary effort is normal.  ?   Breath sounds: Rhonchi (over the lower lung fields) and rales present.  ?Neurological:  ?   General: No focal deficit present.  ?   Mental Status: He is alert and oriented to person, place, and time.  ?Psychiatric:     ?   Behavior: Behavior normal.     ?   Thought Content: Thought content normal.     ?   Judgment: Judgment normal.  ?  ? ? ? Assessment & Plan  ?  ? ?1. Respiratory distress ?Normal vitals, abnormal lung sounds on PE, SOB , wheezing on HPI ?- EKG 12-Lead ?- CBC w/Diff/Platelet ?- TSH ?- Comprehensive metabolic panel ?- DG Chest 2 View; Future ?- Pro b natriuretic peptide (BNP)[facility]) ?- Troponin T ?- furosemide (LASIX) 40 MG tablet; Take 1 tablet (40 mg total) by mouth daily.  Dispense: 30 tablet; Refill: 3 ? ?2. EKG abnormalities ?Hx of SVT ?Undetermined Tachycardia  ?-Nonspecific ST depression   +   Nonspecific T-abnormality ? ?3. SOB (shortness of breath) ?- EKG 12-Lead ?- CBC w/Diff/Platelet ?- TSH ?- Comprehensive metabolic panel ?- DG Chest 2 View; Future:  ? Bilateral lower lobe ground-glass opacities, right worse than the ?left concerning for pneumonia. Start  Doxycycline  ?- Pro b natriuretic peptide (BNP)[facility] [facility]) ?- Troponin T ?- furosemide (LASIX) 40 MG tablet; Take 1 tablet (40 mg total) by mouth daily.  Dispense: 30 tablet; Refill: 3 ? ?4. Uncontrolled hypertension with CKD stage III ?148/96 today, not at goal, could be due to SOB ?Might consider metoprolol vs amlodipine ? ?5. Edema, unspecified type ?Hx of gouty arthropathy ?Bilateral due to HF vs BL venous insufficiency ?- Pro b natriuretic peptide (BNP)[facility]) ?- Troponin T ?- Furosemide (LASIX) 40 MG tablet; Take 1 tablet (40 mg total) by mouth daily.  Dispense: 30 tablet; Refill: 3 ? ?The patient was advised to go to ER or call back or seek an in-person evaluation if the symptoms worsen or if the condition fails to improve as anticipated. ? ?FU in 1 week. If stabilizes fu  with his PCP ? ?I discussed the assessment and treatment plan with the patient, his daughter and with a supervising physician.  The patient was provided an opportunity to ask questions and all were answered. The patient agreed with the plan and demonstrated an understanding of the instructions. ? ?The entirety of the information documented in the History of Present Illness, Review of Systems and Physical Exam were personally obtained by me. Portions of this information were initially documented by the CMA and reviewed by me for thoroughness and accuracy.   ? ? ?Glenn Speak, PA-C  ?[place] ?[phone] (phone) ?[phone] (fax) ? ?[facility] ?

## 2022-03-27 NOTE — Telephone Encounter (Signed)
Pts daughter called and stated that the pt has some swelling of the leg and foot / she also stated that the pt said he may have some blood in his urine / please advise / pt has appt at 9:40 am   ?  ?            ?           Left message to call back about symptoms. ?

## 2022-03-27 NOTE — Telephone Encounter (Signed)
Pt called, LVM to call back if needing additional assistance from NT. Pt has arrived for his appt. Will close out triage.  ? ? ?

## 2022-03-28 ENCOUNTER — Ambulatory Visit: Payer: Self-pay

## 2022-03-28 ENCOUNTER — Emergency Department: Payer: Medicare PPO

## 2022-03-28 ENCOUNTER — Inpatient Hospital Stay
Admission: EM | Admit: 2022-03-28 | Discharge: 2022-04-05 | DRG: 280 | Disposition: A | Discharge status: Hospice | Payer: Medicare PPO | Attending: Family Medicine | Admitting: Family Medicine

## 2022-03-28 ENCOUNTER — Other Ambulatory Visit: Payer: Self-pay

## 2022-03-28 DIAGNOSIS — N281 Cyst of kidney, acquired: Secondary | ICD-10-CM | POA: Diagnosis present

## 2022-03-28 DIAGNOSIS — J189 Pneumonia, unspecified organism: Secondary | ICD-10-CM

## 2022-03-28 DIAGNOSIS — I5084 End stage heart failure: Secondary | ICD-10-CM | POA: Diagnosis present

## 2022-03-28 DIAGNOSIS — K219 Gastro-esophageal reflux disease without esophagitis: Secondary | ICD-10-CM | POA: Diagnosis present

## 2022-03-28 DIAGNOSIS — I10 Essential (primary) hypertension: Secondary | ICD-10-CM | POA: Diagnosis present

## 2022-03-28 DIAGNOSIS — D631 Anemia in chronic kidney disease: Secondary | ICD-10-CM | POA: Diagnosis present

## 2022-03-28 DIAGNOSIS — N184 Chronic kidney disease, stage 4 (severe): Secondary | ICD-10-CM | POA: Diagnosis present

## 2022-03-28 DIAGNOSIS — E1122 Type 2 diabetes mellitus with diabetic chronic kidney disease: Secondary | ICD-10-CM | POA: Diagnosis present

## 2022-03-28 DIAGNOSIS — I130 Hypertensive heart and chronic kidney disease with heart failure and stage 1 through stage 4 chronic kidney disease, or unspecified chronic kidney disease: Principal | ICD-10-CM | POA: Diagnosis present

## 2022-03-28 DIAGNOSIS — M109 Gout, unspecified: Secondary | ICD-10-CM | POA: Diagnosis present

## 2022-03-28 DIAGNOSIS — Z515 Encounter for palliative care: Secondary | ICD-10-CM | POA: Diagnosis not present

## 2022-03-28 DIAGNOSIS — M7121 Synovial cyst of popliteal space [Baker], right knee: Secondary | ICD-10-CM | POA: Diagnosis present

## 2022-03-28 DIAGNOSIS — J9601 Acute respiratory failure with hypoxia: Secondary | ICD-10-CM

## 2022-03-28 DIAGNOSIS — Z66 Do not resuscitate: Secondary | ICD-10-CM | POA: Diagnosis present

## 2022-03-28 DIAGNOSIS — N179 Acute kidney failure, unspecified: Secondary | ICD-10-CM | POA: Diagnosis present

## 2022-03-28 DIAGNOSIS — Z79899 Other long term (current) drug therapy: Secondary | ICD-10-CM

## 2022-03-28 DIAGNOSIS — J80 Acute respiratory distress syndrome: Secondary | ICD-10-CM | POA: Diagnosis present

## 2022-03-28 DIAGNOSIS — I214 Non-ST elevation (NSTEMI) myocardial infarction: Secondary | ICD-10-CM

## 2022-03-28 DIAGNOSIS — I509 Heart failure, unspecified: Secondary | ICD-10-CM | POA: Diagnosis not present

## 2022-03-28 DIAGNOSIS — Z87891 Personal history of nicotine dependence: Secondary | ICD-10-CM

## 2022-03-28 DIAGNOSIS — E440 Moderate protein-calorie malnutrition: Secondary | ICD-10-CM | POA: Diagnosis present

## 2022-03-28 DIAGNOSIS — Z79890 Hormone replacement therapy (postmenopausal): Secondary | ICD-10-CM

## 2022-03-28 DIAGNOSIS — I21A1 Myocardial infarction type 2: Secondary | ICD-10-CM | POA: Diagnosis not present

## 2022-03-28 DIAGNOSIS — Z888 Allergy status to other drugs, medicaments and biological substances status: Secondary | ICD-10-CM

## 2022-03-28 DIAGNOSIS — E039 Hypothyroidism, unspecified: Secondary | ICD-10-CM | POA: Diagnosis present

## 2022-03-28 DIAGNOSIS — Z681 Body mass index (BMI) 19 or less, adult: Secondary | ICD-10-CM | POA: Diagnosis not present

## 2022-03-28 DIAGNOSIS — Z91158 Patient's noncompliance with renal dialysis for other reason: Secondary | ICD-10-CM

## 2022-03-28 DIAGNOSIS — Z9049 Acquired absence of other specified parts of digestive tract: Secondary | ICD-10-CM | POA: Diagnosis not present

## 2022-03-28 DIAGNOSIS — R0602 Shortness of breath: Secondary | ICD-10-CM | POA: Diagnosis present

## 2022-03-28 DIAGNOSIS — D696 Thrombocytopenia, unspecified: Secondary | ICD-10-CM | POA: Diagnosis not present

## 2022-03-28 DIAGNOSIS — E785 Hyperlipidemia, unspecified: Secondary | ICD-10-CM | POA: Diagnosis present

## 2022-03-28 DIAGNOSIS — Z885 Allergy status to narcotic agent status: Secondary | ICD-10-CM

## 2022-03-28 DIAGNOSIS — E869 Volume depletion, unspecified: Secondary | ICD-10-CM | POA: Diagnosis present

## 2022-03-28 DIAGNOSIS — Z20822 Contact with and (suspected) exposure to covid-19: Secondary | ICD-10-CM | POA: Diagnosis present

## 2022-03-28 DIAGNOSIS — I5043 Acute on chronic combined systolic (congestive) and diastolic (congestive) heart failure: Secondary | ICD-10-CM | POA: Insufficient documentation

## 2022-03-28 LAB — CBC WITH DIFFERENTIAL/PLATELET
Abs Immature Granulocytes: 0.05 10*3/uL (ref 0.00–0.07)
Basophils Absolute: 0 10*3/uL (ref 0.0–0.2)
Basophils Absolute: 0 10*3/uL (ref 0.0–0.1)
Basophils Relative: 0 %
Basos: 0 %
EOS (ABSOLUTE): 0 10*3/uL (ref 0.0–0.4)
Eos: 0 %
Eosinophils Absolute: 0 10*3/uL (ref 0.0–0.5)
Eosinophils Relative: 0 %
HCT: 28.6 % — ABNORMAL LOW (ref 39.0–52.0)
Hematocrit: 29.5 % — ABNORMAL LOW (ref 37.5–51.0)
Hemoglobin: 9.7 g/dL — ABNORMAL LOW (ref 13.0–17.7)
Hemoglobin: 9.2 g/dL — ABNORMAL LOW (ref 13.0–17.0)
Immature Grans (Abs): 0.1 10*3/uL (ref 0.0–0.1)
Immature Granulocytes: 1 %
Immature Granulocytes: 1 %
Lymphocytes Absolute: 0.8 10*3/uL (ref 0.7–3.1)
Lymphocytes Relative: 7 %
Lymphs Abs: 0.7 10*3/uL (ref 0.7–4.0)
Lymphs: 8 %
MCH: 30.5 pg (ref 26.6–33.0)
MCH: 30.8 pg (ref 26.0–34.0)
MCHC: 32.9 g/dL (ref 31.5–35.7)
MCHC: 32.2 g/dL (ref 30.0–36.0)
MCV: 93 fL (ref 79–97)
MCV: 95.7 fL (ref 80.0–100.0)
Monocytes Absolute: 0.6 10*3/uL (ref 0.1–0.9)
Monocytes Absolute: 0.6 10*3/uL (ref 0.1–1.0)
Monocytes Relative: 6 %
Monocytes: 6 %
Neutro Abs: 8.1 10*3/uL — ABNORMAL HIGH (ref 1.7–7.7)
Neutrophils Absolute: 7.7 10*3/uL — ABNORMAL HIGH (ref 1.4–7.0)
Neutrophils Relative %: 86 %
Neutrophils: 85 %
Platelets: 126 10*3/uL — ABNORMAL LOW (ref 150–450)
Platelets: 131 10*3/uL — ABNORMAL LOW (ref 150–400)
RBC: 3.18 x10E6/uL — ABNORMAL LOW (ref 4.14–5.80)
RBC: 2.99 MIL/uL — ABNORMAL LOW (ref 4.22–5.81)
RDW: 13.4 % (ref 11.6–15.4)
RDW: 15.4 % (ref 11.5–15.5)
WBC: 9.2 10*3/uL (ref 3.4–10.8)
WBC: 9.5 10*3/uL (ref 4.0–10.5)
nRBC: 0 % (ref 0.0–0.2)

## 2022-03-28 LAB — CBC
HCT: 30.4 % — ABNORMAL LOW (ref 39.0–52.0)
Hemoglobin: 9.9 g/dL — ABNORMAL LOW (ref 13.0–17.0)
MCH: 30.4 pg (ref 26.0–34.0)
MCHC: 32.6 g/dL (ref 30.0–36.0)
MCV: 93.3 fL (ref 80.0–100.0)
Platelets: 135 10*3/uL — ABNORMAL LOW (ref 150–400)
RBC: 3.26 MIL/uL — ABNORMAL LOW (ref 4.22–5.81)
RDW: 15 % (ref 11.5–15.5)
WBC: 10.4 10*3/uL (ref 4.0–10.5)
nRBC: 0 % (ref 0.0–0.2)

## 2022-03-28 LAB — COMPREHENSIVE METABOLIC PANEL
ALT: 9 IU/L (ref 0–44)
ALT: 152 U/L — ABNORMAL HIGH (ref 0–44)
AST: 21 IU/L (ref 0–40)
AST: 229 U/L — ABNORMAL HIGH (ref 15–41)
Albumin/Globulin Ratio: 1.1 — ABNORMAL LOW (ref 1.2–2.2)
Albumin: 3.4 g/dL — ABNORMAL LOW (ref 3.5–4.6)
Albumin: 2.7 g/dL — ABNORMAL LOW (ref 3.5–5.0)
Alkaline Phosphatase: 150 IU/L — ABNORMAL HIGH (ref 44–121)
Alkaline Phosphatase: 222 U/L — ABNORMAL HIGH (ref 38–126)
Anion gap: 12 (ref 5–15)
BUN/Creatinine Ratio: 14 (ref 10–24)
BUN: 47 mg/dL — ABNORMAL HIGH (ref 10–36)
BUN: 54 mg/dL — ABNORMAL HIGH (ref 8–23)
Bilirubin Total: 0.6 mg/dL (ref 0.0–1.2)
CO2: 16 mmol/L — ABNORMAL LOW (ref 20–29)
CO2: 20 mmol/L — ABNORMAL LOW (ref 22–32)
Calcium: 8.7 mg/dL (ref 8.6–10.2)
Calcium: 8.5 mg/dL — ABNORMAL LOW (ref 8.9–10.3)
Chloride: 104 mmol/L (ref 96–106)
Chloride: 104 mmol/L (ref 98–111)
Creatinine, Ser: 3.46 mg/dL — ABNORMAL HIGH (ref 0.76–1.27)
Creatinine, Ser: 3.92 mg/dL — ABNORMAL HIGH (ref 0.61–1.24)
GFR, Estimated: 14 mL/min — ABNORMAL LOW (ref >60)
Globulin, Total: 3.2 g/dL (ref 1.5–4.5)
Glucose, Bld: 106 mg/dL — ABNORMAL HIGH (ref 70–99)
Glucose: 115 mg/dL — ABNORMAL HIGH (ref 70–99)
Potassium: 5 mmol/L (ref 3.5–5.2)
Potassium: 4.9 mmol/L (ref 3.5–5.1)
Sodium: 137 mmol/L (ref 134–144)
Sodium: 136 mmol/L (ref 135–145)
Total Bilirubin: 1.1 mg/dL (ref 0.3–1.2)
Total Protein: 6.6 g/dL (ref 6.0–8.5)
Total Protein: 6.8 g/dL (ref 6.5–8.1)
eGFR: 16 mL/min/{1.73_m2} — ABNORMAL LOW (ref >59)

## 2022-03-28 LAB — TSH: TSH: 15.6 u[IU]/mL — ABNORMAL HIGH (ref 0.450–4.500)

## 2022-03-28 LAB — PRO B NATRIURETIC PEPTIDE: NT-Pro BNP: 47859 pg/mL — ABNORMAL HIGH (ref 0–486)

## 2022-03-28 LAB — RESP PANEL BY RT-PCR (FLU A&B, COVID) ARPGX2
Influenza A by PCR: NEGATIVE
Influenza B by PCR: NEGATIVE
SARS Coronavirus 2 by RT PCR: NEGATIVE

## 2022-03-28 LAB — PROCALCITONIN: Procalcitonin: 0.6 ng/mL

## 2022-03-28 MED ORDER — SODIUM CHLORIDE 0.9 % IV SOLN
250.0000 mL | INTRAVENOUS | Status: DC | PRN
  Administered 2022-03-31: 250 mL via INTRAVENOUS

## 2022-03-28 MED ORDER — FUROSEMIDE 10 MG/ML IJ SOLN
60.0000 mg | Freq: Once | INTRAMUSCULAR | Status: AC
  Administered 2022-03-28: 60 mg via INTRAVENOUS
  Filled 2022-03-28: qty 8

## 2022-03-28 MED ORDER — HEPARIN SODIUM (PORCINE) 5000 UNIT/ML IJ SOLN
5000.0000 [IU] | Freq: Three times a day (TID) | INTRAMUSCULAR | Status: DC
  Administered 2022-03-29 – 2022-04-01 (×12): 5000 [IU] via SUBCUTANEOUS
  Filled 2022-03-28 (×12): qty 1

## 2022-03-28 MED ORDER — CARVEDILOL 6.25 MG PO TABS
6.2500 mg | ORAL_TABLET | Freq: Two times a day (BID) | ORAL | Status: DC
  Administered 2022-03-29 – 2022-04-05 (×11): 6.25 mg via ORAL
  Filled 2022-03-28 (×12): qty 1

## 2022-03-28 MED ORDER — ENOXAPARIN SODIUM 30 MG/0.3ML IJ SOSY: 30.0000 mg | PREFILLED_SYRINGE | INTRAMUSCULAR | Status: DC

## 2022-03-28 MED ORDER — SODIUM CHLORIDE 0.9% FLUSH
3.0000 mL | Freq: Two times a day (BID) | INTRAVENOUS | Status: DC
  Administered 2022-03-29 – 2022-04-03 (×10): 3 mL via INTRAVENOUS

## 2022-03-28 MED ORDER — SODIUM CHLORIDE 0.9 % IV SOLN
100.0000 mg | Freq: Two times a day (BID) | INTRAVENOUS | Status: DC
  Administered 2022-03-28 – 2022-03-30 (×5): 100 mg via INTRAVENOUS
  Filled 2022-03-28 (×5): qty 100

## 2022-03-28 MED ORDER — SODIUM CHLORIDE 0.9% FLUSH: 3.0000 mL | INTRAVENOUS | Status: DC | PRN

## 2022-03-28 MED ORDER — ASPIRIN EC 81 MG PO TBEC
81.0000 mg | DELAYED_RELEASE_TABLET | Freq: Every day | ORAL | Status: DC
  Administered 2022-03-29 – 2022-04-05 (×9): 81 mg via ORAL
  Filled 2022-03-28 (×9): qty 1

## 2022-03-28 MED ORDER — FUROSEMIDE 10 MG/ML IJ SOLN
60.0000 mg | Freq: Two times a day (BID) | INTRAMUSCULAR | Status: DC
  Administered 2022-03-29 – 2022-04-01 (×7): 60 mg via INTRAVENOUS
  Filled 2022-03-28 (×7): qty 6

## 2022-03-28 MED ORDER — ACETAMINOPHEN 325 MG PO TABS
650.0000 mg | ORAL_TABLET | ORAL | Status: DC | PRN
  Administered 2022-04-01 – 2022-04-03 (×3): 650 mg via ORAL
  Filled 2022-03-28 (×3): qty 2

## 2022-03-28 MED ORDER — SODIUM CHLORIDE 0.9 % IV SOLN
1.0000 g | Freq: Once | INTRAVENOUS | Status: AC
  Administered 2022-03-28: 1 g via INTRAVENOUS
  Filled 2022-03-28: qty 10

## 2022-03-28 MED ORDER — ONDANSETRON HCL 4 MG/2ML IJ SOLN: 4.0000 mg | Freq: Four times a day (QID) | INTRAMUSCULAR | Status: DC | PRN

## 2022-03-28 NOTE — ED Notes (Signed)
SpO2 levels dropping. This RN went to assess pt; O2 was disconnected. O2 reapplied & turned to 5L/min [place]. SpO2 now at 93%. ?

## 2022-03-28 NOTE — Progress Notes (Signed)
Transition of care consult placed ?

## 2022-03-28 NOTE — ED Notes (Addendum)
Lab called to process blood specimens that were sent  ?

## 2022-03-28 NOTE — ED Notes (Signed)
Pt placed back on O2. ?

## 2022-03-28 NOTE — H&P (Signed)
?History and Physical  ? ? ?Glenn Mulling Sr. RCV:[id number] DOB: 02-05-1927 DOA: 03/28/2022 ? ?PCP: Mikey Kirschner, PA-C  ?Patient coming from: home ? ?I have personally briefly reviewed patient's old medical records in [facility] ? ?Chief Complaint: recent dx of CAP  with abnormal labs referred to ed by pcp for continued sob and abnormal labs ? ?HPI: Glenn Hollibaugh Sr. is a 88 y.o. male with medical history significant of hypothyroidism, ,Hx of SVT, Uncontrolled HTN, CKDII, Gout, who has interim history of diagnosis of CAP after presenting to pcp 3/30 for respiratory symptoms. Xray results at that time noted bilateral lower lobe ground-glass opacities, right worse than the left concerning for pneumonia. He was started on doxycycline and sent home. However today further labs resulted with most concerning being nt-probnp of 47,859. Patient  was then referred to ED by PCP. Patient BIB EMS in the field it was noted that patient has sat of 84% on ra. He was placed on 5L [place] improved to 90's and transported to ED. Per patient notes that his feel has been swelling for over one month. However he notes he became short of breath 2 days prior to seeing his pcp.  He notes no coughing no fever or chills. He does however endorse poor intake due to low appetite and episode of nausea and vomitting.  On further ros he notes no abdominal pain no diarrhea, no dysuria , no palpitations no chest discomfort, or presyncope. ? ?ED Course:  ?Afeb, bp 178/101, hr 74, rr20 sat 92% on 4L ?Labs: ?Wbc:9.5, hgb9.2 prior 9.7, plt 131 pmn 8.1 ?Na:136, K 4.9, cr 3.92 base around 3.2,  ast 229, alt152, alphos 222 ?RUQ: ?Ekg: nsr , nonspecific t wave , no hyperacute changes  ?Review of Systems: As per HPI otherwise 10 point review of systems negative.  ? ?Past Medical History:  ?Diagnosis Date  ? Arthritis   ? GERD (gastroesophageal reflux disease)   ? Hemorrhoid 1986  ? Hyperlipidemia   ? Hypertension   ? SVT (supraventricular tachycardia)  ([place])   ? Thyroid disease   ? Vertigo   ? ? ?Past Surgical History:  ?Procedure Laterality Date  ? APPENDECTOMY  1968  ? BACK SURGERY    ? COLONOSCOPY N/A 11/27/2017  ? Procedure: COLONOSCOPY;  Surgeon: Toledo, Benay Pike, MD;  Location: [facility] ENDOSCOPY;  Service: Gastroenterology;  Laterality: N/A;  ? COLONOSCOPY WITH PROPOFOL N/A 01/04/2021  ? Procedure: COLONOSCOPY WITH PROPOFOL;  Surgeon: Lucilla Lame, MD;  Location: [facility] ENDOSCOPY;  Service: Endoscopy;  Laterality: N/A;  ? ESOPHAGOGASTRODUODENOSCOPY (EGD) WITH PROPOFOL N/A 11/25/2017  ? Procedure: ESOPHAGOGASTRODUODENOSCOPY (EGD) WITH PROPOFOL;  Surgeon: Toledo, Benay Pike, MD;  Location: [facility] ENDOSCOPY;  Service: Gastroenterology;  Laterality: N/A;  ? EXCISIONAL HEMORRHOIDECTOMY    ? INGUINAL HERNIA REPAIR Right 03/17/2016  ? Procedure: LAPAROSCOPIC INGUINAL HERNIA;  Surgeon: Florene Glen, MD;  Location: [facility] ORS;  Service: General;  Laterality: Right;  ? [place]  ? ? ? reports that he quit smoking about 56 years ago. His smoking use included cigarettes. He has a 50.00 pack-year smoking history. He has never used smokeless tobacco. He reports that he does not drink alcohol and does not use drugs. ? ?Allergies  ?Allergen Reactions  ? Codeine   ? Cortizone-10  [Hydrocortisone]   ? ? ?Family History  ?Problem Relation Age of Onset  ? Alzheimer's disease Father   ? Healthy Sister   ? ? ?Prior to Admission medications   ?Medication  Sig Start Date End Date Taking? Authorizing Provider  ?allopurinol (ZYLOPRIM) 100 MG tablet TAKE 1/2 TABLET BY MOUTH ONCE DAILY 09/27/21   Jerrol Banana., MD  ?amiodarone (PACERONE) 200 MG tablet Take 1 tablet (200 mg total) by mouth daily. Please schedule office visit with cardiology. 02/17/22   Mikey Kirschner, PA-C  ?amLODipine (NORVASC) 5 MG tablet Take 1 tablet (5 mg total) by mouth daily. 10/22/21   Jerrol Banana., MD  ?colchicine-probenecid 0.5-500 MG tablet Take 1 tablet by mouth daily. 10/22/21   Jerrol Banana., MD  ?doxycycline (VIBRA-TABS) 100 MG tablet Take 1 tablet (100 mg total) by mouth 2 (two) times daily. 03/27/22   [name], PA-C  ?ferrous sulfate 325 (65 FE) MG tablet Take 1 tablet (325 mg total) by mouth every other day. 03/01/21   Chrismon, Vickki Muff, PA-C  ?furosemide (LASIX) 40 MG tablet Take 1 tablet (40 mg total) by mouth daily. 03/27/22   [name], PA-C  ?levothyroxine (SYNTHROID) 112 MCG tablet TAKE 1 TABLET BY MOUTH ONCE DAILY ON AN EMPTY STOMACH. WAIT 30 MINUTES BEFORE TAKING OTHER MEDS. 03/20/22   Jerrol Banana., MD  ?Multiple Vitamins-Iron (MULTIVITAMINS WITH IRON) TABS tablet Take 1 tablet by mouth daily. 01/04/19   Chrismon, Vickki Muff, PA-C  ?pantoprazole (PROTONIX) 40 MG tablet Take 1 tablet (40 mg total) by mouth daily. 03/21/22   Mikey Kirschner, PA-C  ?traZODone (DESYREL) 50 MG tablet Take 1 tablet (50 mg total) by mouth at bedtime. Please schedule office visit before any future refill. 02/17/22   Mikey Kirschner, PA-C  ? ? ?Physical Exam: ?Vitals:  ? 03/28/22 1700 03/28/22 1730 03/28/22 1800 03/28/22 1900  ?BP: (!) 184/104 (!) 183/113 (!) 181/100 (!) 179/102  ?Pulse: 65 64 67 66  ?Resp:   19 19  ?Temp:      ?TempSrc:      ?SpO2: 100% 99% 99% 97%  ?Weight:      ?Height:      ? ? ? ?Vitals:  ? 03/28/22 1700 03/28/22 1730 03/28/22 1800 03/28/22 1900  ?BP: (!) 184/104 (!) 183/113 (!) 181/100 (!) 179/102  ?Pulse: 65 64 67 66  ?Resp:   19 19  ?Temp:      ?TempSrc:      ?SpO2: 100% 99% 99% 97%  ?Weight:      ?Height:      ?Constitutional: NAD, calm, comfortable ?Eyes: PERRL, lids and conjunctivae normal ?ENMT: Mucous membranes are moist. Posterior pharynx clear of any exudate or lesions.Normal dentition.  ?Neck: normal, supple, no masses, no thyromegaly ?Respiratory: +crackles rales bilaterally, no wheezing, Normal respiratory effort. No accessory muscle use.  ?Cardiovascular: Regular rate and rhythm, no murmurs / rubs / gallops. +extremity edema. 2+ pedal pulses. No carotid  bruits.  ?Abdomen: no tenderness, no masses palpated. No hepatosplenomegaly. Bowel sounds positive.  ?Musculoskeletal: no clubbing / cyanosis. No joint deformity upper and lower extremities. Good ROM, no contractures. Normal muscle tone.  ?Skin: no rashes, lesions, ulcers. No induration ?Neurologic: CN 2-12 grossly intact. Sensation intact,  Strength 5/5 in all 4.  ?Psychiatric: Normal judgment and insight. Alert and oriented x 3. Normal mood.  ? ? ?Labs on Admission: I have personally reviewed following labs and imaging studies ? ?CBC: ?Recent Labs  ?Lab 03/27/22 ?1315 03/28/22 ?1330  ?WBC 9.2 9.5  ?NEUTROABS 7.7* 8.1*  ?HGB 9.7* 9.2*  ?HCT 29.5* 28.6*  ?MCV 93 95.7  ?PLT 126* 131*  ? ?Basic Metabolic Panel: ?Recent Labs  ?Lab  03/27/22 ?1315 03/28/22 ?1330  ?NA 137 136  ?K 5.0 4.9  ?CL 104 104  ?CO2 16* 20*  ?GLUCOSE 115* 106*  ?BUN 47* 54*  ?CREATININE 3.46* 3.92*  ?CALCIUM 8.7 8.5*  ? ?GFR: ?Estimated Creatinine Clearance: 9.8 mL/min (A) (by C-G formula based on SCr of 3.92 mg/dL (H)). ?Liver Function Tests: ?Recent Labs  ?Lab 03/27/22 ?1315 03/28/22 ?1330  ?AST 21 229*  ?ALT 9 152*  ?ALKPHOS 150* 222*  ?BILITOT 0.6 1.1  ?PROT 6.6 6.8  ?ALBUMIN 3.4* 2.7*  ? ?No results for input(s): LIPASE, AMYLASE in the last 168 hours. ?No results for input(s): AMMONIA in the last 168 hours. ?Coagulation Profile: ?No results for input(s): INR, PROTIME in the last 168 hours. ?Cardiac Enzymes: ?No results for input(s): CKTOTAL, CKMB, CKMBINDEX, TROPONINI in the last 168 hours. ?BNP (last 3 results) ?Recent Labs  ?  03/27/22 ?1315  ?PROBNP [id number]*  ? ?HbA1C: ?No results for input(s): HGBA1C in the last 72 hours. ?CBG: ?No results for input(s): GLUCAP in the last 168 hours. ?Lipid Profile: ?No results for input(s): CHOL, HDL, LDLCALC, TRIG, CHOLHDL, LDLDIRECT in the last 72 hours. ?Thyroid Function Tests: ?Recent Labs  ?  03/27/22 ?1315  ?TSH 15.600*  ? ?Anemia Panel: ?No results for input(s): VITAMINB12, FOLATE, FERRITIN, TIBC,  IRON, RETICCTPCT in the last 72 hours. ?Urine analysis: ?   ?Component Value Date/Time  ? COLORURINE YELLOW (A) 03/29/2018 1300  ? APPEARANCEUR CLEAR (A) 03/29/2018 1300  ? LABSPEC 1.014 03/29/2018 1300  ?

## 2022-03-28 NOTE — Telephone Encounter (Signed)
Pt daughter Alan Mulder reports that pt is weak and does not want to come back in for labs. Yolanda asked that her call be returned to discuss and advise of next steps. Cb# [phone]  ? ?Left message to call back about symptoms. ?

## 2022-03-28 NOTE — Telephone Encounter (Signed)
Pt's daughter called back in asking about pt having a nurse to come out to home to draw blood work. I called and spoke with Sharyn Lull, [facility] who states the message was sent to [place], [place] so we are just waiting for her to respond with recommendations. Pt's daughter was advised and verbalized understanding.  ?

## 2022-03-28 NOTE — Telephone Encounter (Signed)
Second attempt to reach pt.'s daughter, Ms. Wynetta Emery. Message left to call back. ?

## 2022-03-28 NOTE — Progress Notes (Signed)
ED nurse sent secure chat to [place]. Report given by ED nurse at 9:34 pm and told to send patient up at 9:40 pm in secure chat. ? ?Patient arrived floor. Vital signs are stable. Alert and Oriented. 5L of O2 [place]. Sacral foam applied. Swelling in bilateral lower extremities. ?

## 2022-03-28 NOTE — ED Notes (Signed)
Secure msg sent to Ngumabih Ngu, RN for ED to IP SBAR. ?

## 2022-03-28 NOTE — ED Provider Notes (Signed)
Medical screening examination/treatment/procedure(s) were conducted as a shared visit with non-physician practitioner(s) and myself.  I personally evaluated the patient during the encounter. ? ? ?Personally saw and evaluated the patient.  He is hypertensive has edema in his lower extremities bilaterally, developing increased shortness of breath now with oxygen requirement.  He does not appear in any extremis and his oxygenation is appropriate on nasal cannula at this time, but he certainly has the appearance of volume overload.  We will cover him for possible community-acquired pneumonia given his recent diagnosis of pneumonia, but my clinical suspicion for a acute CHF or pulmonary edema type situation is much higher. ? ?We will initiate Lasix.  Anticipate admission to hospital for further care and management.  Pending labs at this time ?  ?Delman Kitten, MD ?03/28/22 1600 ? ?

## 2022-03-28 NOTE — Telephone Encounter (Signed)
Copied from [place] [phone]. Topic: Appointment Scheduling - Scheduling Inquiry for Clinic ?>> Mar 28, 2022 10:40 AM Oneta Rack wrote: ?Reason for CRM: Caller states patient was seen on 03/27/2022 and all labs could not be drawn because patient was dehydrated. Caller states unable to bring patient back in today and would like to know if PCP can arrange for a nurse to come to patient home to draw blood, please advise ?

## 2022-03-28 NOTE — ED Provider Notes (Signed)
? ?[facility] ?Provider Note ? ? ? Event Date/Time  ? First MD Initiated Contact with Patient 03/28/22 1339   ?  (approximate) ? ? ?History  ? ?Chief Complaint ?Shortness of Breath ? ? ?HPI ?Silver Parkey Sr. is a 88 y.o. male, history of hypothyroidism, hypertension, hyperlipidemia, SVT, arthritis, presents emergency department for evaluation of reported shortness of breath.  Patient states that he has been experiencing mild shortness of breath and leg swelling recently over the past week.Marland Kitchen  He was seen by his primary care provider yesterday, where he was diagnosed with pneumonia following chest x-ray.  Patient was provided with doxycycline for outpatient management, however his PCP found today that the patient's BNP level was extremely elevated at 47,859.  Patient does not have any prior history of heart failure.  When the patient was picked up by EMS today, his oxygen was reportedly 84% on room air.  He is not normally on O2 at home.  Denies fever/chills, chest pain, abdominal pain, headache, lightheadedness/dizziness, nausea/vomiting, or urinary symptoms. ? ?History Limitations: No limitations. ? ?  ? ? ?Physical Exam  ?Triage Vital Signs: ?ED Triage Vitals  ?Enc Vitals Group  ?   BP 03/28/22 1308 (!) 178/101  ?   Pulse Rate 03/28/22 1308 74  ?   Resp 03/28/22 1308 20  ?   Temp 03/28/22 1308 98.2 ?F (36.8 ?C)  ?   Temp Source 03/28/22 1308 Oral  ?   SpO2 03/28/22 1305 94 %  ?   Weight 03/28/22 1308 130 lb (59 kg)  ?   Height 03/28/22 1308 '6\' 1"'$  (1.854 m)  ?   Head Circumference --   ?   Peak Flow --   ?   Pain Score 03/28/22 1308 0  ?   Pain Loc --   ?   Pain Edu? --   ?   Excl. in [place]? --   ? ? ?Most recent vital signs: ?Vitals:  ? 03/28/22 1800 03/28/22 1900  ?BP: (!) 181/100 (!) 179/102  ?Pulse: 67 66  ?Resp: 19 19  ?Temp:    ?SpO2: 99% 97%  ? ? ?General: Awake, NAD.  ?Skin: Warm, dry.  ?CV: Good peripheral perfusion.  ?Resp: Normal effort.  Mild rales appreciated bilaterally. ?Abd:  Soft, non-tender. No distention.  ?Neuro: At baseline. No gross neurological deficits.  ?Other: 2+ pitting edema in the lower extremities.  Pulse, motor, sensation intact distally in all extremities. ? ?Physical Exam ? ? ? ?ED Results / Procedures / Treatments  ?Labs ?(all labs ordered are listed, but only abnormal results are displayed) ?Labs Reviewed  ?COMPREHENSIVE METABOLIC PANEL - Abnormal; Notable for the following components:  ?    Result Value  ? CO2 20 (*)   ? Glucose, Bld 106 (*)   ? BUN 54 (*)   ? Creatinine, Ser 3.92 (*)   ? Calcium 8.5 (*)   ? Albumin 2.7 (*)   ? AST 229 (*)   ? ALT 152 (*)   ? Alkaline Phosphatase 222 (*)   ? GFR, Estimated 14 (*)   ? All other components within normal limits  ?CBC WITH DIFFERENTIAL/PLATELET - Abnormal; Notable for the following components:  ? RBC 2.99 (*)   ? Hemoglobin 9.2 (*)   ? HCT 28.6 (*)   ? Platelets 131 (*)   ? Neutro Abs 8.1 (*)   ? All other components within normal limits  ?RESP PANEL BY RT-PCR (FLU A&B, COVID) [id number]  ?CULTURE,  BLOOD (ROUTINE X 2)  ?CULTURE, BLOOD (ROUTINE X 2)  ?PROCALCITONIN  ?LIPASE, BLOOD  ? ? ? ?EKG ?Sinus rhythm, rate of 73, no T-segment changes, no AV blocks, no axis deviations, normal QRS interval. ? ? ?RADIOLOGY ? ?ED Provider Interpretation: I personally reviewed this chest x-ray, findings suggestive of pneumonia patient my interpretation. ? ?DG Chest 1 View ? ?Result Date: 03/28/2022 ?CLINICAL DATA:  Provided history: Shortness of breath. Recent diagnosis of pneumonia. EXAM: CHEST  1 VIEW COMPARISON:  Prior chest radiographs 03/27/2022 and earlier. FINDINGS: Heart size at the upper limits of normal. Aortic atherosclerosis. Progressed from yesterday's chest radiographs, there is extensive airspace disease throughout both lungs (right greater than left). No evidence of pleural effusion or pneumothorax. No acute bony abnormality identified. IMPRESSION: Extensive airspace disease throughout both lungs (right greater than left),  progressed from yesterday's chest radiographs. Aortic Atherosclerosis (ICD10-I70.0). Electronically Signed   By: Kellie Simmering D.O.   On: 03/28/2022 14:41  ? ?US Abdomen Limited RUQ (LIVER/GB) ? ?Result Date: 03/28/2022 ?CLINICAL DATA:  Abnormal liver function tests EXAM: ULTRASOUND ABDOMEN LIMITED RIGHT UPPER QUADRANT COMPARISON:  None. FINDINGS: Gallbladder: There are no demonstrable gallbladder stones. Technologist did not observe any tenderness. Small amount of fluid is noted around the gallbladder. This may be part small ascites. If there is clinical suspicion for acute cholecystitis, radionuclide hepatobiliary scan may be considered. Common bile duct: Diameter: 2.7 mm Liver: No focal lesion identified. Within normal limits in parenchymal echogenicity. Portal vein is patent on color Doppler imaging with normal direction of blood flow towards the liver. Other: Right pleural effusion is seen. Small ascites is seen. There is increased cortical echogenicity in the right kidney possibly suggesting medical renal disease. IMPRESSION: There is fluid around the gallbladder, most likely part of ascites. Less likely possibility would be acute cholecystitis. There are no other imaging signs of acute cholecystitis. There are no demonstrable gallbladder stones. There is no dilation of bile ducts. Right pleural effusion. Small ascites. Possible medical renal disease. Electronically Signed   By: Elmer Picker M.D.   On: 03/28/2022 19:24   ? ?PROCEDURES: ? ?Critical Care performed: None. ? ?Procedures ? ? ? ?MEDICATIONS ORDERED IN ED: ?Medications  ?doxycycline (VIBRAMYCIN) 100 mg in sodium chloride 0.9 % 250 mL IVPB (0 mg Intravenous Stopped 03/28/22 1719)  ?cefTRIAXone (ROCEPHIN) 1 g in sodium chloride 0.9 % 100 mL IVPB (0 g Intravenous Stopped 03/28/22 1512)  ?furosemide (LASIX) injection 60 mg (60 mg Intravenous Given 03/28/22 1517)  ? ? ? ?IMPRESSION / MDM / ASSESSMENT AND PLAN / ED COURSE  ?I reviewed the triage vital  signs and the nursing notes. ?             ?               ? ? ?Differential diagnosis includes, but is not limited to, ACS, heart failure, COPD, pneumonia, influenza/COVID-19, pulmonary embolism ? ? ? ?ED Course ?Patient appears well.  Notably hypertensive at 179/90.  NAD.  Patient does appear volume overloaded, will go ahead initiate 60 mg furosemide. ? ?Chest x-ray shows evidence of extensive airspace disease.  We will go ahead initiate ceftriaxone and doxycycline. ? ?CBC shows no leukocytosis.  Anemia present at 9.2, though consistent with baseline. ? ?CMP shows elevated creatinine at 3.92 from 3.46 yesterday.  Additionally, he does have a significant transaminitis with AST at 229 and ALT at 152.  Alkaline phosphatase 222.  Will order right upper quadrant ultrasound to further evaluate. ? ?  Right upper quadrant ultrasound shows no evidence of gallstones or obstruction.  Does show mild ascites and right-sided pleural effusion.  See above for details. ? ?Assessment/Plan ?Patient presents with shortness of breath and leg swelling x 7 days, found to have significant airspace opacities consistent with pneumonia.  In addition, patient's BNP is notably elevated at 47,856 with evidence of fluid overload given significant pedal edema, pleural effusion on ultrasound, and evidence of rales on auscultation.  Initiated rocephin/doxycycline, as well as furosemide.  Interestingly, patient does have a notable transaminitis, though right upper quadrant ultrasound was ultimately negative for evidence of gallstones or acute obstruction.  We will plan to admit this patient for further evaluation and management.  Spoke to the on-call hospitalist, who agreed to admission.  ? ?  ? ? ?FINAL CLINICAL IMPRESSION(S) / ED DIAGNOSES  ? ?Final diagnoses:  ?Shortness of breath  ? ? ? ?Rx / DC Orders  ? ?ED Discharge Orders   ? ? None  ? ?  ? ? ? ?Note:  This document was prepared using Dragon voice recognition software and may include  unintentional dictation errors. ?  ?Teodoro Spray, [place] ?03/28/22 2011 ? ?  ?Delman Kitten, MD ?03/29/22 [id number] ? ?

## 2022-03-28 NOTE — Telephone Encounter (Signed)
Please advise 

## 2022-03-28 NOTE — ED Notes (Signed)
Report received from ED Nurse: ? ?Pt is on 4L [place], purewick in place. Family has been with him most of the day but I think they all have left for the night.  ?

## 2022-03-28 NOTE — ED Triage Notes (Signed)
Pt was just seen for pneumonia yesterday at PCP and also had lab work drawn. Pt PCP called and said for him to come to emergency room due to results of lab work. When EMS arrived pt was 84% RA and was placed on 6L. Family is unsure what labs PCP was calling about.  ?

## 2022-03-29 ENCOUNTER — Inpatient Hospital Stay
Admit: 2022-03-29 | Discharge: 2022-03-29 | Disposition: A | Discharge status: Home / Self Care | Payer: Medicare PPO | Attending: Internal Medicine | Admitting: Internal Medicine

## 2022-03-29 ENCOUNTER — Encounter: Payer: Self-pay | Admitting: Physician Assistant

## 2022-03-29 DIAGNOSIS — I509 Heart failure, unspecified: Secondary | ICD-10-CM | POA: Diagnosis not present

## 2022-03-29 LAB — BASIC METABOLIC PANEL
Anion gap: 10 (ref 5–15)
BUN: 64 mg/dL — ABNORMAL HIGH (ref 8–23)
CO2: 22 mmol/L (ref 22–32)
Calcium: 8.5 mg/dL — ABNORMAL LOW (ref 8.9–10.3)
Chloride: 107 mmol/L (ref 98–111)
Creatinine, Ser: 3.98 mg/dL — ABNORMAL HIGH (ref 0.61–1.24)
GFR, Estimated: 13 mL/min — ABNORMAL LOW (ref >60)
Glucose, Bld: 86 mg/dL (ref 70–99)
Potassium: 5.4 mmol/L — ABNORMAL HIGH (ref 3.5–5.1)
Sodium: 139 mmol/L (ref 135–145)

## 2022-03-29 LAB — ECHOCARDIOGRAM COMPLETE
Area-P 1/2: 3.68 cm2
Calc EF: 33.1 %
Height: 73 in
S' Lateral: 3.24 cm
Single Plane A2C EF: 39.6 %
Single Plane A4C EF: 30.1 %
Weight: 2000.01 oz

## 2022-03-29 LAB — CREATININE, SERUM
Creatinine, Ser: 3.88 mg/dL — ABNORMAL HIGH (ref 0.61–1.24)
GFR, Estimated: 14 mL/min — ABNORMAL LOW (ref >60)

## 2022-03-29 LAB — LIPASE, BLOOD: Lipase: 21 U/L (ref 11–51)

## 2022-03-29 LAB — T4, FREE: Free T4: 1.59 ng/dL — ABNORMAL HIGH (ref 0.61–1.12)

## 2022-03-29 MED ORDER — LEVOTHYROXINE SODIUM 112 MCG PO TABS
112.0000 ug | ORAL_TABLET | Freq: Every day | ORAL | Status: DC
  Administered 2022-03-30 – 2022-04-05 (×7): 112 ug via ORAL
  Filled 2022-03-29 (×8): qty 1

## 2022-03-29 MED ORDER — SODIUM ZIRCONIUM CYCLOSILICATE 10 G PO PACK
10.0000 g | PACK | ORAL | Status: AC
  Administered 2022-03-29: 10 g via ORAL
  Filled 2022-03-29: qty 1

## 2022-03-29 MED ORDER — SODIUM CHLORIDE 0.9 % IV SOLN
2.0000 g | INTRAVENOUS | Status: DC
  Administered 2022-03-29 – 2022-03-30 (×2): 2 g via INTRAVENOUS
  Filled 2022-03-29 (×2): qty 2

## 2022-03-29 NOTE — Progress Notes (Signed)
OT Cancellation Note ? ?Patient Details ?Name: Glenn Vandyne Sr. ?MRN: [id number] ?DOB: [birth year] ? ? ?Cancelled Treatment:    Reason Eval/Treat Not Completed: Patient not medically ready;Other (comment) (Pt K is 5.4. Per MD [place] Crane hold therapy at this time. OT will re-attempt as able and medically approrpiate.) ?Shanon Payor, OTD OTR/L  ?03/29/22, 10:52 AM  ?

## 2022-03-29 NOTE — Progress Notes (Addendum)
PT Cancellation Note ? ?Patient Details ?Name: Glenn Woodmansee Sr. ?MRN: [id number] ?DOB: [birth year] ? ? ?Cancelled Treatment:    Reason Eval/Treat Not Completed: Medical issues which prohibited therapy Hold due to elevated K+ and MD agreeable to hold today; PT to attempt at later date/time.  ? ? ?Jonnie Kind, SPT ?03/29/2022, 12:13 PM ?

## 2022-03-29 NOTE — Progress Notes (Unsigned)
Called to patient and spoke with his daughter about the results of his labs , specifically, his elevated BNP.  ?Patient was started on Furosemide '40mg'$ , his oxygen saturation was 100%, he couldn't ambulate due to knee pain and we measured oxygen sat after moving him from table to wheelchair. (3.30.23. ) Patient was advised to go to ER [facility] for appropriate treatment on 03/28/22 ?

## 2022-03-29 NOTE — Progress Notes (Signed)
*  PRELIMINARY RESULTS* ?Echocardiogram ?2D Echocardiogram has been performed. ? ?Glenn Jarvis ?03/29/2022, 1:58 PM ?

## 2022-03-29 NOTE — Consult Note (Signed)
?Glenn Ginley Sr. is a 88 y.o. male  [id number] ? ?Primary Cardiologist: Neoma Laming, MD ?Reason for Consultation: acute CHF ? ?HPI: Glenn Saccente Sr. is a 88 y.o. male with medical history significant of hypothyroidism, Hx of SVT, Uncontrolled HTN, CKDIV, Gout, who has interim history of diagnosis of CAP after presenting to pcp 3/30 for respiratory symptoms. Xray results at that time noted bilateral lower lobe ground-glass opacities, right worse than the left concerning for pneumonia. He was started on doxycycline and sent home. Further labs resulted on 03/27/22 with most concerning being nt-probnp of 47,859. Patient  was then referred to ED by PCP. Patient BIB EMS in the field it was noted that patient has sat of 84% on ra. He was placed on 5L [place] improved to 90's and transported to ED.  Patient endorses diffuse edema for 1 month.   ? ?Review of Systems: Denies chest pain. Shortness of breath improving since coming to the hospital. ? ? ?Past Medical History:  ?Diagnosis Date  ? Arthritis   ? GERD (gastroesophageal reflux disease)   ? Hemorrhoid 1986  ? Hyperlipidemia   ? Hypertension   ? SVT (supraventricular tachycardia) ([place])   ? Thyroid disease   ? Vertigo   ? ? ?Medications Prior to Admission  ?Medication Sig Dispense Refill  ? allopurinol (ZYLOPRIM) 100 MG tablet TAKE 1/2 TABLET BY MOUTH ONCE DAILY 30 tablet 3  ? amiodarone (PACERONE) 200 MG tablet Take 1 tablet (200 mg total) by mouth daily. Please schedule office visit with cardiology. 30 tablet 0  ? amLODipine (NORVASC) 5 MG tablet Take 1 tablet (5 mg total) by mouth daily. 90 tablet 3  ? colchicine-probenecid 0.5-500 MG tablet Take 1 tablet by mouth daily. 90 tablet 3  ? doxycycline (VIBRA-TABS) 100 MG tablet Take 1 tablet (100 mg total) by mouth 2 (two) times daily. 10 tablet 0  ? ferrous sulfate 325 (65 FE) MG tablet Take 1 tablet (325 mg total) by mouth every other day. 30 tablet 0  ? furosemide (LASIX) 40 MG tablet Take 1 tablet (40 mg total) by  mouth daily. 30 tablet 3  ? levothyroxine (SYNTHROID) 112 MCG tablet TAKE 1 TABLET BY MOUTH ONCE DAILY ON AN EMPTY STOMACH. WAIT 30 MINUTES BEFORE TAKING OTHER MEDS. 90 tablet 0  ? Multiple Vitamins-Iron (MULTIVITAMINS WITH IRON) TABS tablet Take 1 tablet by mouth daily. 90 tablet 1  ? pantoprazole (PROTONIX) 40 MG tablet Take 1 tablet (40 mg total) by mouth daily. 90 tablet 1  ? traZODone (DESYREL) 50 MG tablet Take 1 tablet (50 mg total) by mouth at bedtime. Please schedule office visit before any future refill. 30 tablet 0  ? ? ? ? aspirin EC  81 mg Oral Daily  ? carvedilol  6.25 mg Oral BID WC  ? furosemide  60 mg Intravenous Q12H  ? heparin injection (subcutaneous)  5,000 Units Subcutaneous Q8H  ? [START ON 03/30/2022] levothyroxine  112 mcg Oral Q0600  ? sodium chloride flush  3 mL Intravenous Q12H  ? ? ?Infusions: ? sodium chloride    ? cefTRIAXone (ROCEPHIN)  IV 2 g (03/29/22 1053)  ? doxycycline (VIBRAMYCIN) IV 100 mg (03/29/22 1456)  ? ? ?Allergies  ?Allergen Reactions  ? Codeine   ? Cortizone-10  [Hydrocortisone]   ? ? ?Social History  ? ?Socioeconomic History  ? Marital status: Widowed  ?  Spouse name: Not on file  ? Number of children: Not on file  ? Years of education: Not  on file  ? Highest education level: Not on file  ?Occupational History  ? Occupation: retired  ?Tobacco Use  ? Smoking status: Former  ?  Packs/day: 2.00  ?  Years: 25.00  ?  Pack years: 50.00  ?  Types: Cigarettes  ?  Quit date: 12/28/1965  ?  Years since quitting: 56.2  ? Smokeless tobacco: Never  ?Vaping Use  ? Vaping Use: Never used  ?Substance and Sexual Activity  ? Alcohol use: No  ?  Comment: 1 beer occasionally  ? Drug use: No  ? Sexual activity: Not Currently  ?Other Topics Concern  ? Not on file  ?Social History Narrative  ? Not on file  ? ?Social Determinants of Health  ? ?Financial Resource Strain: Not on file  ?Food Insecurity: Not on file  ?Transportation Needs: Not on file  ?Physical Activity: Not on file  ?Stress: Not  on file  ?Social Connections: Not on file  ?Intimate Partner Violence: Not on file  ? ? ?Family History  ?Problem Relation Age of Onset  ? Alzheimer's disease Father   ? Healthy Sister   ? ? ?PHYSICAL EXAM: ?Vitals:  ? 03/29/22 0726 03/29/22 1227  ?BP: (!) 133/91 118/72  ?Pulse: (!) 115 (!) 59  ?Resp:  20  ?Temp: 97.6 ?F (36.4 ?C) (!) 97.4 ?F (36.3 ?C)  ?SpO2: 98% 98%  ? ? ? ?Intake/Output Summary (Last 24 hours) at 03/29/2022 1623 ?Last data filed at 03/29/2022 1500 ?Gross per 24 hour  ?Intake 357.16 ml  ?Output 850 ml  ?Net -492.84 ml  ? ? ?General:  Well appearing. No respiratory difficulty ?HEENT: normal ?Neck: supple. no JVD. Carotids 2+ bilat; no bruits. No lymphadenopathy or thryomegaly appreciated. ?Cor: PMI nondisplaced. Regular rate & rhythm. No rubs, gallops or murmurs. ?Lungs: clear ?Abdomen: soft, nontender, nondistended. No hepatosplenomegaly. No bruits or masses. Good bowel sounds. ?Extremities: no cyanosis, clubbing, rash, edema ?Neuro: alert & oriented x 3, cranial nerves grossly intact. moves all 4 extremities w/o difficulty. Affect pleasant. ? ?ECG: sinus rhythm, HR 77 bpm, incomplete RBBB, LVH, prolonged QT, non-specific ST changes.  ? ?Results for orders placed or performed during the hospital encounter of 03/28/22 (from the past 24 hour(s))  ?Lipase, blood     Status: None  ? Collection Time: 03/28/22 11:33 PM  ?Result Value Ref Range  ? Lipase 21 11 - 51 U/L  ?CBC     Status: Abnormal  ? Collection Time: 03/28/22 11:33 PM  ?Result Value Ref Range  ? WBC 10.4 4.0 - 10.5 K/uL  ? RBC 3.26 (L) 4.22 - 5.81 MIL/uL  ? Hemoglobin 9.9 (L) 13.0 - 17.0 g/dL  ? HCT 30.4 (L) 39.0 - 52.0 %  ? MCV 93.3 80.0 - 100.0 fL  ? MCH 30.4 26.0 - 34.0 pg  ? MCHC 32.6 30.0 - 36.0 g/dL  ? RDW 15.0 11.5 - 15.5 %  ? Platelets 135 (L) 150 - 400 K/uL  ? nRBC 0.0 0.0 - 0.2 %  ?Creatinine, serum     Status: Abnormal  ? Collection Time: 03/28/22 11:33 PM  ?Result Value Ref Range  ? Creatinine, Ser 3.88 (H) 0.61 - 1.24 mg/dL  ?  GFR, Estimated 14 (L) >60 mL/min  ?T4, free     Status: Abnormal  ? Collection Time: 03/28/22 11:33 PM  ?Result Value Ref Range  ? Free T4 1.59 (H) 0.61 - 1.12 ng/dL  ?Basic metabolic panel     Status: Abnormal  ? Collection Time: 03/29/22  4:22  AM  ?Result Value Ref Range  ? Sodium 139 135 - 145 mmol/L  ? Potassium 5.4 (H) 3.5 - 5.1 mmol/L  ? Chloride 107 98 - 111 mmol/L  ? CO2 22 22 - 32 mmol/L  ? Glucose, Bld 86 70 - 99 mg/dL  ? BUN 64 (H) 8 - 23 mg/dL  ? Creatinine, Ser 3.98 (H) 0.61 - 1.24 mg/dL  ? Calcium 8.5 (L) 8.9 - 10.3 mg/dL  ? GFR, Estimated 13 (L) >60 mL/min  ? Anion gap 10 5 - 15  ? ?DG Chest 1 View ? ?Result Date: 03/28/2022 ?CLINICAL DATA:  Provided history: Shortness of breath. Recent diagnosis of pneumonia. EXAM: CHEST  1 VIEW COMPARISON:  Prior chest radiographs 03/27/2022 and earlier. FINDINGS: Heart size at the upper limits of normal. Aortic atherosclerosis. Progressed from yesterday's chest radiographs, there is extensive airspace disease throughout both lungs (right greater than left). No evidence of pleural effusion or pneumothorax. No acute bony abnormality identified. IMPRESSION: Extensive airspace disease throughout both lungs (right greater than left), progressed from yesterday's chest radiographs. Aortic Atherosclerosis (ICD10-I70.0). Electronically Signed   By: Kellie Simmering D.O.   On: 03/28/2022 14:41  ? ?ECHOCARDIOGRAM COMPLETE ? ?Result Date: 03/29/2022 ?   ECHOCARDIOGRAM REPORT   Patient Name:   Glenn Stewart Sr. Date of Exam: 03/29/2022 Medical Rec #:  [id number]           Height:       73.0 in Accession #:    [id number]          Weight:       125.0 lb Date of Birth:  1927-11-06            BSA:          1.762 m? Patient Age:    78 years            BP:           118/72 mmHg Patient Gender: M                   HR:           68 bpm. Exam Location:  [facility] Procedure: 2D Echo, Cardiac Doppler and Color Doppler Indications:     CHF-Acute Systolic L07.86  History:         Patient has prior history  of Echocardiogram examinations, most                  recent 06/02/2018. Risk Factors:Hypertension and Diabetes.  Sonographer:     Luane School RDCS Referring Phys:  [id number] [facility] Diagnosing Phys

## 2022-03-29 NOTE — Progress Notes (Signed)
12 Lead EKG performed. Copy placed in patient's chart. ? ?Doxycycline currently infusing at 125 ml/hour See MAR ? ?Incentive spirometer offered to patient ?

## 2022-03-29 NOTE — Progress Notes (Signed)
? ?PROGRESS NOTE ? ?Glenn Mulling Sr. ZMO:[id number] DOB: November 12, 1927 DOA: 03/28/2022 ?PCP: Mikey Kirschner, PA-C ? ?HPI/Recap of past 24 hours: ?Glenn Jarvis. is a 88 y.o. male with medical history significant of hypothyroidism, Hx of SVT, Uncontrolled HTN, CKDIV, Gout, who has interim history of diagnosis of CAP after presenting to pcp 3/30 for respiratory symptoms. Xray results at that time noted bilateral lower lobe ground-glass opacities, right worse than the left concerning for pneumonia. He was started on doxycycline and sent home. However today further labs resulted with most concerning being nt-probnp of 47,859. Patient  was then referred to ED by PCP. Patient BIB EMS in the field it was noted that patient has sat of 84% on ra. He was placed on 5L [place] improved to 90's and transported to ED.  Patient endorses diffuse edema for 1 month.  Associated with dyspnea with minimal exertion.  Denies any chest pain.  ? ?03/29/2022: Patient was seen and examined at his bedside.  His son was present in the room.  Reports dyspnea with minimal movement.  Reviewed chest x-ray done on 03/28/2022 which showed diffuse pulmonary infiltrates and edema.  He is currently on IV diuretics and IV antibiotics. ? ?Assessment/Plan: ?Principal Problem: ?  CHF (congestive heart failure) ([place]) ? ?New onset CHF with acute hypoxic respiratory failure ?proBNP greater than 47,000, pulmonary edema seen on chest x-ray. ?TSH 15.6 ?Continue IV diuretics resume home levothyroxine. ?2D echo completed on 03/29/2022, results are pending. ?Continue strict I's and O's and daily weight. ?Cardiology consult ?  ?Multifocal CAP, POA ?Post reviewed chest x-ray done on admission which showed bilateral pulmonary infiltrates. ?Continue Rocephin and doxycycline. ?Continue bronchodilators and pulmonary toilet. ?  ?Hypothyroidism  ?-elevated tsh greater than 15 ?-per patient he is complaint with his replacement hormone  ?Resume home levothyroxine. ?  ? Hx of  SVT ?-no complaints of palpitations or chest pain  ?- stable hr since admission  ?Continue to monitor vital signs. ?  ? Uncontrolled HTN ?-patient states he missed 24 hours of med due to n/v  ?Continue home regimen  ?-place on prn anti-HTN medication  ?  ?AKI on CKDIV, suspect prerenal in the setting of dehydration from poor oral intake ?Baseline creatinine appears to be 3.21 with GFR of 18. ?Creatinine uptrending 3.98 from 3.88. ?Avoid nephrotoxic agent, dehydration and hypotension ?Monitor urine output with strict I's and O's. ?  ? Gout ?Stable ? ?DVT prophylaxis: hep sq 3 times daily. ?Code Status: Full ?Family Communication: None at bedside. ?Disposition Plan: patient  expected to be admitted greater than 2 midnights  ?Consults called: cardiology  ?Admission status: inpatient ?  ? ? ?Status is: Inpatient ?Patient requires at least 2 midnights for further evaluation and treatment of present condition. ? ? ? ?Objective: ?Vitals:  ? 03/29/22 0343 03/29/22 0500 03/29/22 0726 03/29/22 1227  ?BP:  (!) 173/100 (!) 133/91 118/72  ?Pulse:  81 (!) 115 (!) 59  ?Resp:  19  20  ?Temp:  99.1 ?F (37.3 ?C) 97.6 ?F (36.4 ?C) (!) 97.4 ?F (36.3 ?C)  ?TempSrc:  Oral Oral   ?SpO2:  98% 98% 98%  ?Weight: 56.7 kg     ?Height: '6\' 1"'$  (1.854 m)     ? ? ?Intake/Output Summary (Last 24 hours) at 03/29/2022 1523 ?Last data filed at 03/29/2022 1500 ?Gross per 24 hour  ?Intake 357.16 ml  ?Output 850 ml  ?Net -492.84 ml  ? ?Filed Weights  ? 03/28/22 1308 03/29/22 0343  ?Weight: 59 kg  56.7 kg  ? ? ?Exam: ? ?General: 88 y.o. year-old male well developed well nourished in no acute distress.  Alert and interactive. ?Cardiovascular: Regular rate and rhythm with no rubs or gallops.  No thyromegaly or JVD noted.   ?Respiratory: Diffuse rales bilaterally.  Poor inspiratory effort.   ?Abdomen: Soft nontender nondistended with normal bowel sounds x4 quadrants. ?Musculoskeletal: 1+ pitting edema in lower extremities bilaterally.   ?Skin: No ulcerative  lesions noted or rashes ?Psychiatry: Mood is appropriate for condition and setting ? ? ?Data Reviewed: ?CBC: ?Recent Labs  ?Lab 03/27/22 ?1315 03/28/22 ?1330 03/28/22 ?2333  ?WBC 9.2 9.5 10.4  ?NEUTROABS 7.7* 8.1*  --   ?HGB 9.7* 9.2* 9.9*  ?HCT 29.5* 28.6* 30.4*  ?MCV 93 95.7 93.3  ?PLT 126* 131* 135*  ? ?Basic Metabolic Panel: ?Recent Labs  ?Lab 03/27/22 ?1315 03/28/22 ?1330 03/28/22 ?2333 03/29/22 ?0422  ?NA 137 136  --  139  ?K 5.0 4.9  --  5.4*  ?CL 104 104  --  107  ?CO2 16* 20*  --  22  ?GLUCOSE 115* 106*  --  86  ?BUN 47* 54*  --  64*  ?CREATININE 3.46* 3.92* 3.88* 3.98*  ?CALCIUM 8.7 8.5*  --  8.5*  ? ?GFR: ?Estimated Creatinine Clearance: 9.3 mL/min (A) (by C-G formula based on SCr of 3.98 mg/dL (H)). ?Liver Function Tests: ?Recent Labs  ?Lab 03/27/22 ?1315 03/28/22 ?1330  ?AST 21 229*  ?ALT 9 152*  ?ALKPHOS 150* 222*  ?BILITOT 0.6 1.1  ?PROT 6.6 6.8  ?ALBUMIN 3.4* 2.7*  ? ?Recent Labs  ?Lab 03/28/22 ?2333  ?LIPASE 21  ? ?No results for input(s): AMMONIA in the last 168 hours. ?Coagulation Profile: ?No results for input(s): INR, PROTIME in the last 168 hours. ?Cardiac Enzymes: ?No results for input(s): CKTOTAL, CKMB, CKMBINDEX, TROPONINI in the last 168 hours. ?BNP (last 3 results) ?Recent Labs  ?  03/27/22 ?1315  ?PROBNP [id number]*  ? ?HbA1C: ?No results for input(s): HGBA1C in the last 72 hours. ?CBG: ?No results for input(s): GLUCAP in the last 168 hours. ?Lipid Profile: ?No results for input(s): CHOL, HDL, LDLCALC, TRIG, CHOLHDL, LDLDIRECT in the last 72 hours. ?Thyroid Function Tests: ?Recent Labs  ?  03/27/22 ?1315 03/28/22 ?2333  ?TSH 15.600*  --   ?FREET4  --  1.59*  ? ?Anemia Panel: ?No results for input(s): VITAMINB12, FOLATE, FERRITIN, TIBC, IRON, RETICCTPCT in the last 72 hours. ?Urine analysis: ?   ?Component Value Date/Time  ? COLORURINE YELLOW (A) 03/29/2018 1300  ? APPEARANCEUR CLEAR (A) 03/29/2018 1300  ? LABSPEC 1.014 03/29/2018 1300  ? PHURINE 5.0 03/29/2018 1300  ? GLUCOSEU NEGATIVE  03/29/2018 1300  ? HGBUR SMALL (A) 03/29/2018 1300  ? [place] NEGATIVE 03/29/2018 1300  ? [place] NEGATIVE 03/29/2018 1300  ? PROTEINUR 30 (A) 03/29/2018 1300  ? NITRITE NEGATIVE 03/29/2018 1300  ? LEUKOCYTESUR NEGATIVE 03/29/2018 1300  ? ?Sepsis Labs: ?'@LABRCNTIP'$ (procalcitonin:4,lacticidven:4) ? ?) ?Recent Results (from the past 240 hour(s))  ?Culture, blood (routine x 2)     Status: None (Preliminary result)  ? Collection Time: 03/28/22  1:30 PM  ? Specimen: BLOOD  ?Result Value Ref Range Status  ? Specimen Description BLOOD BLOOD LEFT ARM UPPER  Final  ? Special Requests   Final  ?  BOTTLES DRAWN AEROBIC AND ANAEROBIC Blood Culture adequate volume  ? Culture   Final  ?  NO GROWTH < 24 HOURS ?Performed at [facility], [address], [place], [place] [address] ?  ?  Report Status PENDING  Incomplete  ?Resp Panel by RT-PCR (Flu A&B, Covid) Nasopharyngeal Swab     Status: None  ? Collection Time: 03/28/22  2:12 PM  ? Specimen: Nasopharyngeal Swab; Nasopharyngeal(NP) swabs in vial transport medium  ?Result Value Ref Range Status  ? SARS Coronavirus 2 by RT PCR NEGATIVE NEGATIVE Final  ?  Comment: (NOTE) ?SARS-CoV-2 target nucleic acids are NOT DETECTED. ? ?The SARS-CoV-2 RNA is generally detectable in upper respiratory ?specimens during the acute phase of infection. The lowest ?concentration of SARS-CoV-2 viral copies this assay can detect is ?138 copies/mL. A negative result does not preclude SARS-Cov-2 ?infection and should not be used as the sole basis for treatment or ?other patient management decisions. A negative result may occur with  ?improper specimen collection/handling, submission of specimen other ?than nasopharyngeal swab, presence of viral mutation(s) within the ?areas targeted by this assay, and inadequate number of viral ?copies(<138 copies/mL). A negative result must be combined with ?clinical observations, patient history, and epidemiological ?information. The expected result is  Negative. ? ?Fact Sheet for Patients:  ?EntrepreneurPulse.com.au ? ?Fact Sheet for Healthcare Providers:  ?IncredibleEmployment.be ? ?This test is no t yet approved or cleared by the Unit

## 2022-03-30 ENCOUNTER — Inpatient Hospital Stay: Payer: Medicare PPO

## 2022-03-30 DIAGNOSIS — I509 Heart failure, unspecified: Secondary | ICD-10-CM | POA: Diagnosis not present

## 2022-03-30 LAB — BASIC METABOLIC PANEL
Anion gap: 10 (ref 5–15)
BUN: 80 mg/dL — ABNORMAL HIGH (ref 8–23)
CO2: 22 mmol/L (ref 22–32)
Calcium: 7.8 mg/dL — ABNORMAL LOW (ref 8.9–10.3)
Chloride: 105 mmol/L (ref 98–111)
Creatinine, Ser: 4.42 mg/dL — ABNORMAL HIGH (ref 0.61–1.24)
GFR, Estimated: 12 mL/min — ABNORMAL LOW (ref >60)
Glucose, Bld: 102 mg/dL — ABNORMAL HIGH (ref 70–99)
Potassium: 4.8 mmol/L (ref 3.5–5.1)
Sodium: 137 mmol/L (ref 135–145)

## 2022-03-30 LAB — BLOOD GAS, ARTERIAL
Acid-base deficit: 5 mmol/L — ABNORMAL HIGH (ref 0.0–2.0)
Bicarbonate: 18.6 mmol/L — ABNORMAL LOW (ref 20.0–28.0)
FIO2: 44 %
O2 Content: 6 L/min
O2 Saturation: 96.7 %
Patient temperature: 37
pCO2 arterial: 30 mmHg — ABNORMAL LOW (ref 32–48)
pH, Arterial: 7.4 (ref 7.35–7.45)
pO2, Arterial: 74 mmHg — ABNORMAL LOW (ref 83–108)

## 2022-03-30 LAB — CBC
HCT: 25.4 % — ABNORMAL LOW (ref 39.0–52.0)
Hemoglobin: 8.4 g/dL — ABNORMAL LOW (ref 13.0–17.0)
MCH: 30.9 pg (ref 26.0–34.0)
MCHC: 33.1 g/dL (ref 30.0–36.0)
MCV: 93.4 fL (ref 80.0–100.0)
Platelets: 123 10*3/uL — ABNORMAL LOW (ref 150–400)
RBC: 2.72 MIL/uL — ABNORMAL LOW (ref 4.22–5.81)
RDW: 14.9 % (ref 11.5–15.5)
WBC: 9.9 10*3/uL (ref 4.0–10.5)
nRBC: 0 % (ref 0.0–0.2)

## 2022-03-30 LAB — RESPIRATORY PANEL BY PCR

## 2022-03-30 LAB — MAGNESIUM: Magnesium: 1.8 mg/dL (ref 1.7–2.4)

## 2022-03-30 LAB — PHOSPHORUS: Phosphorus: 5.2 mg/dL — ABNORMAL HIGH (ref 2.5–4.6)

## 2022-03-30 LAB — T3: T3, Total: 35 ng/dL — ABNORMAL LOW (ref 71–180)

## 2022-03-30 LAB — BRAIN NATRIURETIC PEPTIDE: B Natriuretic Peptide: 1284.8 pg/mL — ABNORMAL HIGH (ref 0.0–100.0)

## 2022-03-30 LAB — PROCALCITONIN: Procalcitonin: 1.29 ng/mL

## 2022-03-30 MED ORDER — IPRATROPIUM-ALBUTEROL 0.5-2.5 (3) MG/3ML IN SOLN
3.0000 mL | Freq: Four times a day (QID) | RESPIRATORY_TRACT | Status: DC
  Administered 2022-03-30 – 2022-03-31 (×5): 3 mL via RESPIRATORY_TRACT
  Filled 2022-03-30 (×5): qty 3

## 2022-03-30 MED ORDER — DOXYCYCLINE HYCLATE 100 MG PO TABS
100.0000 mg | ORAL_TABLET | Freq: Two times a day (BID) | ORAL | Status: DC
  Administered 2022-03-31 – 2022-04-03 (×6): 100 mg via ORAL
  Filled 2022-03-30 (×6): qty 1

## 2022-03-30 MED ORDER — PIPERACILLIN-TAZOBACTAM IN DEX 2-0.25 GM/50ML IV SOLN
2.2500 g | Freq: Three times a day (TID) | INTRAVENOUS | Status: DC
Start: 2022-03-30 — End: 2022-04-03
  Administered 2022-03-30 – 2022-04-03 (×11): 2.25 g via INTRAVENOUS
  Filled 2022-03-30 (×13): qty 50

## 2022-03-30 NOTE — Progress Notes (Signed)
Pharmacy Antibiotic Note ? ?Glenn Jakubiak Sr. is a 88 y.o. male with medical history significant of hypothyroidism, ,Hx of SVT, HTN, CKD, and gout, who has interim history of diagnosis of CAP after presenting to PCP on 3/30 for respiratory symptoms and was admitted on 03/28/2022.  Pharmacy has been consulted for Zosyn dosing for worsening pneumonia. ? ?Plan: ?Start Zosyn 2.25 g IV q8hrs ? ?Height: '6\' 1"'$  (185.4 cm) ?Weight: 59.9 kg (132 lb 0.9 oz) ?IBW/kg (Calculated) : 79.9 ? ?Temp (24hrs), Avg:97.9 ?F (36.6 ?C), Min:97.5 ?F (36.4 ?C), Max:98.2 ?F (36.8 ?C) ? ?Recent Labs  ?Lab 03/27/22 ?1315 03/28/22 ?1330 03/28/22 ?2333 03/29/22 ?0422 03/30/22 ?0434  ?WBC 9.2 9.5 10.4  --  9.9  ?CREATININE 3.46* 3.92* 3.88* 3.98* 4.42*  ?  ?Estimated Creatinine Clearance: 8.8 mL/min (A) (by C-G formula based on SCr of 4.42 mg/dL (H)).   ? ?Allergies  ?Allergen Reactions  ? Codeine   ? Cortizone-10  [Hydrocortisone]   ? ? ?Antimicrobials this admission: ?3/31 ceftriaxone >> 4/2 ?3/31 doxycycline >>  ?4/02 Zosyn >>  ? ? ?Microbiology results: ?3/31 BCx: NGTD ? ?Thank you for allowing pharmacy to be a part of this patient?s care. ? ?Forde Dandy Darnice Comrie ?03/30/2022 4:33 PM ? ?

## 2022-03-30 NOTE — Progress Notes (Signed)
SUBJECTIVE: Glenn Fenter. is a 88 y.o. male with medical history significant of hypothyroidism, Hx of SVT, Uncontrolled HTN, CKDIV, Gout, who has interim history of diagnosis of CAP after presenting to pcp 3/30 for respiratory symptoms. Xray results at that time noted bilateral lower lobe ground-glass opacities, right worse than the left concerning for pneumonia. He was started on doxycycline and sent home. Further labs resulted on 03/27/22 with most concerning being nt-probnp of 47,859. Patient  was then referred to ED by PCP. Patient BIB EMS in the field it was noted that patient has sat of 84% on ra. He was placed on 5L [place] improved to 90's and transported to ED.  Patient endorses diffuse edema for 1 month.   ? ?Denies chest pain. Shortness of breath and lower extremity edema improved.  ? ? ?Vitals:  ? 03/30/22 0004 03/30/22 9381 03/30/22 0423 03/30/22 0849  ?BP: 137/79 (!) 142/85  129/87  ?Pulse: (!) 54 (!) 52  72  ?Resp: 16 15    ?Temp: 98.2 ?F (36.8 ?C) 98.2 ?F (36.8 ?C)  98.2 ?F (36.8 ?C)  ?TempSrc: Oral Oral  Oral  ?SpO2: 99% 96%  94%  ?Weight:   59.9 kg   ?Height:      ? ? ?Intake/Output Summary (Last 24 hours) at 03/30/2022 0946 ?Last data filed at 03/30/2022 0550 ?Gross per 24 hour  ?Intake 107.36 ml  ?Output 1350 ml  ?Net -1242.64 ml  ? ? ?LABS: ?Basic Metabolic Panel: ?Recent Labs  ?  03/29/22 ?0422 03/30/22 ?8299  ?NA 139 137  ?K 5.4* 4.8  ?CL 107 105  ?CO2 22 22  ?GLUCOSE 86 102*  ?BUN 64* 80*  ?CREATININE 3.98* 4.42*  ?CALCIUM 8.5* 7.8*  ? ?Liver Function Tests: ?Recent Labs  ?  03/27/22 ?1315 03/28/22 ?1330  ?AST 21 229*  ?ALT 9 152*  ?ALKPHOS 150* 222*  ?BILITOT 0.6 1.1  ?PROT 6.6 6.8  ?ALBUMIN 3.4* 2.7*  ? ?Recent Labs  ?  03/28/22 ?2333  ?LIPASE 21  ? ?CBC: ?Recent Labs  ?  03/27/22 ?1315 03/28/22 ?1330 03/28/22 ?1330 03/28/22 ?2333 03/30/22 ?0434  ?WBC 9.2 9.5   < > 10.4 9.9  ?NEUTROABS 7.7* 8.1*  --   --   --   ?HGB 9.7* 9.2*   < > 9.9* 8.4*  ?HCT 29.5* 28.6*   < > 30.4* 25.4*  ?MCV 93 95.7   <  > 93.3 93.4  ?PLT 126* 131*   < > 135* 123*  ? < > = values in this interval not displayed.  ? ?Cardiac Enzymes: ?No results for input(s): CKTOTAL, CKMB, CKMBINDEX, TROPONINI in the last 72 hours. ?BNP: ?Invalid input(s): POCBNP ?D-Dimer: ?No results for input(s): DDIMER in the last 72 hours. ?Hemoglobin A1C: ?No results for input(s): HGBA1C in the last 72 hours. ?Fasting Lipid Panel: ?No results for input(s): CHOL, HDL, LDLCALC, TRIG, CHOLHDL, LDLDIRECT in the last 72 hours. ?Thyroid Function Tests: ?Recent Labs  ?  03/27/22 ?1315  ?TSH 15.600*  ? ?Anemia Panel: ?No results for input(s): VITAMINB12, FOLATE, FERRITIN, TIBC, IRON, RETICCTPCT in the last 72 hours. ? ? ?PHYSICAL EXAM ?General: Well developed, well nourished, in no acute distress ?HEENT:  Normocephalic and atramatic ?Neck:  No JVD.  ?Lungs: Clear bilaterally to auscultation and percussion. ?Heart: HRRR . Normal S1 and S2 without gallops or murmurs.  ?Abdomen: Bowel sounds are positive, abdomen soft and non-tender  ?Msk:  Back normal, normal gait. Normal strength and tone for age. ?Extremities: No clubbing, cyanosis  or edema.   ?Neuro: Alert and oriented X 3. ?Psych:  Good affect, responds appropriately ? ?TELEMETRY: NSR, 72 bpm ? ?ASSESSMENT AND PLAN: Patient resting comfortably in bed. Denies chest pain. Shortness of breath and lower extremity edema improved. Echo done 03/29/22 showed EF 25-30%, grade III DD. Patient b/p and HR controlled on carvedilol. CKDIV, unable to start Entresto or Ace/ARB. Continue with IV Lasix. No changes at this time. We will continue to follow.  ? ?Principal Problem: ?  CHF (congestive heart failure) ([place]) ?  ? ?[employer], FNP-C ?03/30/2022 ?9:46 AM ? ? ? ?    ?

## 2022-03-30 NOTE — Evaluation (Signed)
Occupational Therapy Evaluation ?Patient Details ?Name: Glenn Matos Sr. ?MRN: [id number] ?DOB: 09-Mar-1927 ?Today's Date: 03/30/2022 ? ? ?History of Present Illness Pt is a 88 year old male presenting to pcp 3/30 for respiratory symptoms. Xray results at that time noted bilateral lower lobe ground-glass opacities, right worse than the left concerning for pneumonia. He was started on doxycycline and sent home. Further labs resulted with most concerning being nt-probnp of 47,859. Patient  was then referred to ED by PCP. PMH significant for hypothyroidism, Hx of SVT, Uncontrolled HTN, CKDIV, Gout, who has interim history of diagnosis of CAP  ? ?Clinical Impression ?  ?Chart reviewed, RN cleared pt for participation OT evaluation. Pt is alert an oriented x4, good awareness of deficits. Per pt and daughter pt was MOD I in ADL, asisst required with IADL. 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 Pt presents with deficits in strength, endurance, activity tolerance on this date affecting optimal ADL completion. Pt is performing ADL/mobility below PLOF and would benefit from additional OT to facilitate return to PLOF. Pt and daughter would like pt to return home. Pt and daughter educated on recommendations. Pt could return home with frequent/constant supervision with HHOT at this time, however if that level of care is unable to be provided, recommend STR.  ?   ? ?Recommendations for follow up therapy are one component of a multi-disciplinary discharge planning process, led by the attending physician.  Recommendations may be updated based on patient status, additional functional criteria and insurance authorization.  ? ?Follow Up Recommendations ? Skilled nursing-short term rehab (<3 hours/day)  ?  ?Assistance Recommended at Discharge Frequent or constant Supervision/Assistance  ?Patient can return home with the following A little help with walking and/or transfers;A  lot of help with bathing/dressing/bathroom;Assistance with cooking/housework;Direct supervision/assist for financial management;Assistance with feeding;Assist for transportation;Help with stairs or ramp for entrance ? ?  ?Functional Status Assessment ? Patient has had a recent decline in their functional status and demonstrates the ability to make significant improvements in function in a reasonable and predictable amount of time.  ?Equipment Recommendations ? BSC/3in1  ?  ?Recommendations for Other Services   ? ? ?  ?Precautions / Restrictions Precautions ?Precautions: Fall ?Precaution Comments: watch spo2 ?Restrictions ?Weight Bearing Restrictions: No  ? ?  ? ?Mobility Bed Mobility ?Overal bed mobility: Needs Assistance ?Bed Mobility: Supine to Sit ?  ?  ?Supine to sit: Supervision, HOB elevated ?  ?  ?  ?  ? ?Transfers ?Overall transfer level: Needs assistance ?Equipment used: Rolling walker (2 wheels) ?Transfers: Sit to/from Stand, Bed to chair/wheelchair/BSC ?Sit to Stand: Min assist, From elevated surface ?  ?  ?  ?  ?  ?  ?  ? ?  ?Balance Overall balance assessment: Needs assistance ?Sitting-balance support: Feet supported ?Sitting balance-Leahy Scale: Good ?  ?  ?Standing balance support: Bilateral upper extremity supported, During functional activity ?Standing balance-Leahy Scale: Fair ?  ?  ?  ?  ?  ?  ?  ?  ?  ?  ?  ?  ?   ? ?ADL either performed or assessed with clinical judgement  ? ?ADL Overall ADL's : Needs assistance/impaired ?Eating/Feeding: Set up ?  ?Grooming: Dance movement psychotherapist;Wash/dry hands;Sitting;Set up ?  ?Upper Body Bathing: Set up;Sitting ?  ?Lower Body Bathing: Maximal assistance;Sit to/from stand ?Lower Body Bathing Details (indicate cue type and reason): anticipated ?Upper Body Dressing : Minimal assistance;Sitting ?  ?  Lower Body Dressing: Maximal assistance;Bed level ?Lower Body Dressing Details (indicate cue type and reason): socks, pt with dyspnea bending over ?Toilet Transfer: Minimal  assistance;Ambulation ?Toilet Transfer Details (indicate cue type and reason): simulated to bedside chair with RW, short amb transfer ?  ?  ?  ?  ?Functional mobility during ADLs: Minimal assistance;Rolling walker (2 wheels) ?General ADL Comments: deconditioned  ? ? ? ?Vision Patient Visual Report: No change from baseline ?   ?   ?Perception   ?  ?Praxis   ?  ? ?Pertinent Vitals/Pain Pain Assessment ?Pain Assessment: 0-10 ?Pain Score: 0-No pain  ? ? ? ?Hand Dominance Right ?  ?Extremity/Trunk Assessment Upper Extremity Assessment ?Upper Extremity Assessment: Generalized weakness ?  ?Lower Extremity Assessment ?Lower Extremity Assessment: Generalized weakness ?  ?Cervical / Trunk Assessment ?Cervical / Trunk Assessment: Kyphotic ?  ?Communication Communication ?Communication: HOH ?  ?Cognition Arousal/Alertness: Awake/alert ?Behavior During Therapy: [facility] for tasks assessed/performed ?Overall Cognitive Status: Within Functional Limits for tasks assessed ?  ?  ?  ?  ?  ?  ?  ?  ?  ?  ?  ?  ?  ?  ?  ?  ?General Comments: alert and oriented x4, good awareness of deficits ?  ?  ?General Comments  spo2 down to low 80s on 7L [place] during ambulatory transfer however back up ot 97% via 6L [place] seated in chair at end of evaluation ? ?  ?Exercises Other Exercises ?Other Exercises: edu re: role of rehab, role of OT, discharge recommendations, home safety. Education also provided to daugther on phone following evaluation ?  ?Shoulder Instructions    ? ? ?Home Living Family/patient expects to be discharged to:: Private residence ?Living Arrangements: Children ?Available Help at Discharge: Family;Available PRN/intermittently ?Type of Home: House ?Home Access: Stairs to enter ?Entrance Stairs-Number of Steps: 3 ?Entrance Stairs-Rails: Left;Right;Can reach both ?Home Layout: One level ?  ?  ?Bathroom Shower/Tub: Tub/shower unit ?  ?  ?  ?  ?Home Equipment: Rollator (4 wheels);Shower seat;Cane - single point;Grab bars -  tub/shower;Wheelchair - manual ?  ?  ?  ? ?  ?Prior Functioning/Environment Prior Level of Function : Independent/Modified Independent ?  ?  ?  ?  ?  ?  ?Mobility Comments: pt reports MOD I with SPC, has mwc if needed ?ADLs Comments: Pt reports intermittent assist for ADL, typically MOD I-I in all ADL ?  ? ?  ?  ?OT Problem List: Decreased strength;Impaired balance (sitting and/or standing);Decreased activity tolerance ?  ?   ?OT Treatment/Interventions: Self-care/ADL training;Visual/perceptual remediation/compensation;Therapeutic exercise;Patient/family education;Energy conservation;Therapeutic activities;DME and/or AE instruction  ?  ?OT Goals(Current goals can be found in the care plan section) Acute Rehab OT Goals ?Patient Stated Goal: go home ?OT Goal Formulation: With patient/family ?Time For Goal Achievement: 04/13/22 ?Potential to Achieve Goals: Fair ?ADL Goals ?Pt Will Perform Grooming: with modified independence;sitting;standing ?Pt Will Perform Upper Body Dressing: with modified independence;sitting ?Pt Will Perform Lower Body Dressing: with modified independence;sit to/from stand;sitting/lateral leans ?Pt Will Transfer to Toilet: with modified independence;bedside commode ?Pt Will Perform Toileting - Clothing Manipulation and hygiene: with modified independence;sit to/from stand  ?OT Frequency: Min 3X/week ?  ? ?Co-evaluation   ?  ?  ?  ?  ? ?  ?AM-PAC OT "6 Clicks" Daily Activity     ?Outcome Measure Help from another person eating meals?: None ?Help from another person taking care of personal grooming?: None ?Help from another person toileting, which includes using toliet, bedpan,  or urinal?: A Little ?Help from another person bathing (including washing, rinsing, drying)?: A Lot ?Help from another person to put on and taking off regular upper body clothing?: A Little ?Help from another person to put on and taking off regular lower body clothing?: A Lot ?6 Click Score: 18 ?  ?End of Session Equipment  Utilized During Treatment: Gait belt;Rolling walker (2 wheels) ?Nurse Communication: Mobility status ? ?Activity Tolerance: Patient tolerated treatment well ?Patient left: in chair;with call bell/phone within reach;with cha

## 2022-03-30 NOTE — Progress Notes (Signed)
PHARMACIST - PHYSICIAN COMMUNICATION ? ?CONCERNING: Antibiotic IV to Oral Route Change Policy ? ?RECOMMENDATION: ?This patient is receiving doxycycline by the intravenous route.  Based on criteria approved by the Pharmacy and Therapeutics Committee, the antibiotic(s) is/are being converted to the equivalent oral dose form(s). ? ? ?DESCRIPTION: ?These criteria include: ?Patient being treated for a respiratory tract infection, urinary tract infection, cellulitis or clostridium difficile associated diarrhea if on metronidazole ?The patient is not neutropenic and does not exhibit a GI malabsorption state ?The patient is eating (either orally or via tube) and/or has been taking other orally administered medications for a least 24 hours ?The patient is improving clinically and has a Tmax < 100.5 ? ?If you have questions about this conversion, please contact the Pharmacy Department  ? ?Glenn Jarvis  ?03/30/22  ?

## 2022-03-30 NOTE — Progress Notes (Signed)
? ?PROGRESS NOTE ? ?Glenn Jarvis. GBT:[id number] DOB: 02/22/1927 DOA: 03/28/2022 ?PCP: Glenn Kirschner, PA-C ? ?HPI/Recap of past 24 hours: ?Glenn Jarvis. is a 88 y.o. male with medical history significant of hypothyroidism, Hx of SVT, Uncontrolled HTN, CKD IV, Gout, diagnosis of CAP after presenting to pcp 3/30 for respiratory symptoms. Xray results at that time noted bilateral lower lobe ground-glass opacities, right worse than the left concerning for pneumonia. He was started on doxycycline and sent home.  ? ?Patient was referred to ED by PCP due to abnormal labs, proBNP of 47,859.  Patient BIB EMS.  In the field he was noted to be hypoxic with O2 saturation of 84% on room air.   He was placed on 5L [place] improved to the 90's and transported to the ED.  Patient endorses bilateral lower extremity edema for 1 month.  Associated with dyspnea with minimal exertion.  Denies any chest pain.  ? ?Chest x-ray done on 03/28/2022 which showed diffuse pulmonary infiltrates and edema.  He is currently on IV diuretics and IV antibiotics.  Repeated chest x-ray on 03/30/2022, showing no significant improvement.  Procalcitonin uptrending 1.29 from 0.60.  Due to concern for worsening pneumonia escalated IV antibiotics to Zosyn.  MRSA screening test ordered and pending.  Hypoxemic on ABG 6 L HFNC, pH 7.04/27/73/18.  Speech therapist consulted to rule out dysphagia. ? ?03/30/2022: Seen and examined at his bedside.  States he feels weak.  Liberalize his diet due to poor oral intake. ? ? ?Assessment/Plan: ?Principal Problem: ?  CHF (congestive heart failure) ([place]) ? ?Acute combined diastolic and systolic CHF, LVEF 25 to 71%, grade 3 diastolic dysfunction on 2D echo 03/29/2022. ?proBNP greater than 47,000, pulmonary edema seen on chest x-ray. ?On IV diuretics per cardiology 60 mg twice daily. ?2D echo completed on 03/29/2022 ?Cardiology following. ?Continue strict I's and O's and daily weight. ?Net I&O -2.0 L ?  ?Multifocal CAP,  POA ?Personally reviewed chest x-ray done on admission which showed bilateral pulmonary infiltrates.  Repeated chest x-ray on 03/30/2022 to see progression, no significant improvement. ?Procalcitonin uptrending.  Concern for worsening pneumonia, possible aspiration pneumonia, will need to rule out dysphagia. ?Rocephin and doxycycline discontinued on 03/30/2022. ?IV antibiotics escalated to Zosyn on 03/30/2022. ?MRSA screening test ordered and pending. ?Incentive spirometer, flutter valve. ?Bronchodilators and pulmonary toilet ?Obtain sputum culture ?Continue to follow-up blood cultures ? ?Acute hypoxic respiratory failure secondary to multifocal pneumonia ?Not on oxygen supplementation at baseline ?Currently on 6 L high flow nasal cannula ?ABG done on 03/30/2022 pH 7.04/27/73/18. ?Continue to maintain oxygen saturation greater than 92%. ? ?Suspected aspiration pneumonia with possible dysphagia ?Speech therapist consulted to rule out dysphagia ?Aspiration precautions in place. ?Continue empiric IV antibiotic Zosyn, will de-escalate once hemodynamically stable. ? ?Worsening AKI on CKD IV, suspect prerenal in the setting of dehydration from poor oral intake ?Baseline creatinine appears to be 3.21 with GFR of 18. ?Creatinine uptrending 4.42 from 3.98 from 3.88. ?Nephrology consulted to assist with the management. ?Nonoliguric. ? ?Bilateral lower extremity edema ?Tenderness with palpation ?Rule out DVT ?Obtain bilateral lower extremity Doppler ultrasound 03/30/22. ?  ?Hypothyroidism  ?TSH 15.6, free T4 1.59. ?Continue home levothyroxine ?  ? Hx of SVT ?-no complaints of palpitations or chest pain  ?Heart rate is stable, 67 ?  ? Uncontrolled HTN, improving. ?-patient states he missed 24 hours of med due to n/v prior to admission. ?Currently on Coreg 6.25 mg twice daily, IV Lasix 60 mg twice daily ?Continue to  closely monitor vital signs ?  ?Gout ?No acute issues. ?Avoid NSAIDs in the setting of AKI on CKD 4. ? ?Physical  debility/ambulatory dysfunction ?PT assessment recommending SNF. ?Continue PT OT with assistance and fall precautions. ?TOC consulted to assist with SNF placement. ? ?Critical care time: 65 minutes. ? ?DVT prophylaxis: hep sq 3 times daily. ?Code Status: Full ?Family Communication: Updated his daughter at bedside. ?Disposition Plan: Likely will discharge to home once cardiology signs off and oxygen requirement is improved, currently on 6 L, not on oxygen supplementation at baseline. ? ?Consults called: cardiology  ?Admission status: inpatient ?  ? ? ?Status is: Inpatient ?Patient requires at least 2 midnights for further evaluation and treatment of present condition. ? ? ? ?Objective: ?Vitals:  ? 03/30/22 0423 03/30/22 0849 03/30/22 1039 03/30/22 1156  ?BP:  129/87  (!) 141/80  ?Pulse:  72  (!) 51  ?Resp:    18  ?Temp:  98.2 ?F (36.8 ?C)  (!) 97.5 ?F (36.4 ?C)  ?TempSrc:  Oral    ?SpO2:  94% 97% 100%  ?Weight: 59.9 kg     ?Height:      ? ? ?Intake/Output Summary (Last 24 hours) at 03/30/2022 1631 ?Last data filed at 03/30/2022 1400 ?Gross per 24 hour  ?Intake 0 ml  ?Output 1350 ml  ?Net -1350 ml  ? ?Filed Weights  ? 03/28/22 1308 03/29/22 0343 03/30/22 0423  ?Weight: 59 kg 56.7 kg 59.9 kg  ? ? ?Exam: ? ?General: 88 y.o. year-old male frail-appearing in no acute distress.  He is alert and noted x3.  Very hard of hearing.  ?Cardiovascular: Regular rate and rhythm no rubs or gallops.  ?Respiratory: Diffuse rales bilaterally with poor inspiratory effort. ?Abdomen: Soft nontender normal bowel sounds present.   ?Musculoskeletal: 1+ pitting edema in lower extremities bilaterally.  Tender with calves moderate palpation. ?Skin: No ulcerative lesions noted. ?Psychiatry: Mood is appropriate for condition and setting. ?Neuro: Awake and alert. ? ? ?Data Reviewed: ?CBC: ?Recent Labs  ?Lab 03/27/22 ?1315 03/28/22 ?1330 03/28/22 ?2333 03/30/22 ?0434  ?WBC 9.2 9.5 10.4 9.9  ?NEUTROABS 7.7* 8.1*  --   --   ?HGB 9.7* 9.2* 9.9* 8.4*  ?HCT  29.5* 28.6* 30.4* 25.4*  ?MCV 93 95.7 93.3 93.4  ?PLT 126* 131* 135* 123*  ? ?Basic Metabolic Panel: ?Recent Labs  ?Lab 03/27/22 ?1315 03/28/22 ?1330 03/28/22 ?2333 03/29/22 ?0422 03/30/22 ?0434  ?NA 137 136  --  139 137  ?K 5.0 4.9  --  5.4* 4.8  ?CL 104 104  --  107 105  ?CO2 16* 20*  --  22 22  ?GLUCOSE 115* 106*  --  86 102*  ?BUN 47* 54*  --  64* 80*  ?CREATININE 3.46* 3.92* 3.88* 3.98* 4.42*  ?CALCIUM 8.7 8.5*  --  8.5* 7.8*  ?MG  --   --   --   --  1.8  ?PHOS  --   --   --   --  5.2*  ? ?GFR: ?Estimated Creatinine Clearance: 8.8 mL/min (A) (by C-G formula based on SCr of 4.42 mg/dL (H)). ?Liver Function Tests: ?Recent Labs  ?Lab 03/27/22 ?1315 03/28/22 ?1330  ?AST 21 229*  ?ALT 9 152*  ?ALKPHOS 150* 222*  ?BILITOT 0.6 1.1  ?PROT 6.6 6.8  ?ALBUMIN 3.4* 2.7*  ? ?Recent Labs  ?Lab 03/28/22 ?2333  ?LIPASE 21  ? ?No results for input(s): AMMONIA in the last 168 hours. ?Coagulation Profile: ?No results for input(s): INR, PROTIME in the last  168 hours. ?Cardiac Enzymes: ?No results for input(s): CKTOTAL, CKMB, CKMBINDEX, TROPONINI in the last 168 hours. ?BNP (last 3 results) ?Recent Labs  ?  03/27/22 ?1315  ?PROBNP [id number]*  ? ?HbA1C: ?No results for input(s): HGBA1C in the last 72 hours. ?CBG: ?No results for input(s): GLUCAP in the last 168 hours. ?Lipid Profile: ?No results for input(s): CHOL, HDL, LDLCALC, TRIG, CHOLHDL, LDLDIRECT in the last 72 hours. ?Thyroid Function Tests: ?Recent Labs  ?  03/28/22 ?2333  ?FREET4 1.59*  ? ?Anemia Panel: ?No results for input(s): VITAMINB12, FOLATE, FERRITIN, TIBC, IRON, RETICCTPCT in the last 72 hours. ?Urine analysis: ?   ?Component Value Date/Time  ? COLORURINE YELLOW (A) 03/29/2018 1300  ? APPEARANCEUR CLEAR (A) 03/29/2018 1300  ? LABSPEC 1.014 03/29/2018 1300  ? PHURINE 5.0 03/29/2018 1300  ? GLUCOSEU NEGATIVE 03/29/2018 1300  ? HGBUR SMALL (A) 03/29/2018 1300  ? [place] NEGATIVE 03/29/2018 1300  ? [place] NEGATIVE 03/29/2018 1300  ? PROTEINUR 30 (A) 03/29/2018 1300   ? NITRITE NEGATIVE 03/29/2018 1300  ? LEUKOCYTESUR NEGATIVE 03/29/2018 1300  ? ?Sepsis Labs: ?'@LABRCNTIP'$ (procalcitonin:4,lacticidven:4) ? ?) ?Recent Results (from the past 240 hour(s))  ?Culture, blood (routine x 2)

## 2022-03-30 NOTE — Progress Notes (Signed)
Assumed care of pt at 1900. A&O X4. Acutely requiring 3-4 L [place] overnight. No c/o pain. Medication administration per MAR. Full assessment per flowsheets. External cath in place. I&Os as documented. Call bell within reach, making needs known. Comfort and safety maintained.  ?

## 2022-03-30 NOTE — Evaluation (Addendum)
Physical Therapy Evaluation ?Patient Details ?Name: Glenn Pamer Sr. ?MRN: [id number] ?DOB: [birth year] ?Today's Date: 03/30/2022 ? ?History of Present Illness ? Pt is a 88 year old male presenting to pcp 3/30 for respiratory symptoms. Xray results at that time noted bilateral lower lobe ground-glass opacities, right worse than the left concerning for pneumonia. He was started on doxycycline and sent home. Further labs resulted in patient being referred to ED from PCP. PMH significant for hypothyroidism, Hx of SVT, Uncontrolled HTN, CKDIV, Gout, who has interim history of diagnosis of CAP ?  ?Clinical Impression ? Pt awake and alert sitting in recliner eating some fruit upon PT entrance into room for today's evaluation. Pt A&Ox4 and is very willing to work w/ PT today. Prior to hospitalization Pt states he lives in a 1-story home w/ his daughter and is independent w/ ADLs at baseline. He states he does use a rollator at baseline to get around.  ? ?Pt is able to perform sit to stand w/ minA using RW; verbal cues provided for emphasis on using arm rest for UE support to stand. Once standing he is able to ambulate a few steps towards the door (~59f) w/ CGA using RW and returns to bed. Pt returned to bed w/ SUPERVISION, all needs w/in reach prior to PT exiting room. Pt will benefit from continued skilled PT in order to increase LE strength/endurance, improve functional mobility, and restore PLOF. Current discharge recommendation to SNF is appropriate due to the level of assistance required by the patient to ensure safety and improve overall function. ? ?   ? ?Recommendations for follow up therapy are one component of a multi-disciplinary discharge planning process, led by the attending physician.  Recommendations may be updated based on patient status, additional functional criteria and insurance authorization. ? ?Follow Up Recommendations Skilled nursing-short term rehab (<3 hours/day) ? ?  ?Assistance Recommended at  Discharge Intermittent Supervision/Assistance  ?Patient can return home with the following ? A little help with walking and/or transfers;A little help with bathing/dressing/bathroom;Assistance with cooking/housework;Assist for transportation;Help with stairs or ramp for entrance ? ?  ?Equipment Recommendations Rolling walker (2 wheels)  ?Recommendations for Other Services ?    ?  ?Functional Status Assessment Patient has had a recent decline in their functional status and demonstrates the ability to make significant improvements in function in a reasonable and predictable amount of time.  ? ?  ?Precautions / Restrictions Precautions ?Precautions: Fall ?Precaution Comments: watch spo2 ?Restrictions ?Weight Bearing Restrictions: No  ? ?  ? ?Mobility ? Bed Mobility ?Overal bed mobility: Needs Assistance ?Bed Mobility: Sit to Supine ?  ?  ?  ?Sit to supine: Supervision ?  ?General bed mobility comments: received in recliner ?  ? ?Transfers ?Overall transfer level: Needs assistance ?Equipment used: Rolling walker (2 wheels) ?Transfers: Sit to/from Stand, Bed to chair/wheelchair/BSC ?Sit to Stand: Min assist ?  ?  ?  ?  ?  ?  ?  ? ?Ambulation/Gait ?Ambulation/Gait assistance: Min guard ?Gait Distance (Feet): 10 Feet ?Assistive device: Rolling walker (2 wheels) ?Gait Pattern/deviations: Step-through pattern, Decreased step length - right, Decreased step length - left, Decreased stride length ?Gait velocity: decreased ?  ?  ?  ? ?Stairs ?  ?  ?  ?  ?  ? ?Wheelchair Mobility ?  ? ?Modified Rankin (Stroke Patients Only) ?  ? ?  ? ?Balance Overall balance assessment: Needs assistance ?  ?Sitting balance-Leahy Scale: Good ?  ?  ?Standing balance support: Bilateral upper  extremity supported, During functional activity, Reliant on assistive device for balance ?Standing balance-Leahy Scale: Fair ?  ?  ?  ?  ?  ?  ?  ?  ?  ?  ?  ?  ?   ? ? ? ?Pertinent Vitals/Pain Pain Assessment ?Pain Assessment: No/denies pain  ? ? ?Home Living  Family/patient expects to be discharged to:: Private residence ?Living Arrangements: Children ?Available Help at Discharge: Family;Available PRN/intermittently ?Type of Home: House ?Home Access: Stairs to enter ?Entrance Stairs-Rails: Left;Right;Can reach both ?Entrance Stairs-Number of Steps: 3 ?  ?Home Layout: One level ?Home Equipment: Rollator (4 wheels);Shower seat;Cane - single point;Grab bars - tub/shower;Wheelchair - manual ?   ?  ?Prior Function Prior Level of Function : Independent/Modified Independent ?  ?  ?  ?  ?  ?  ?Mobility Comments: pt reports MOD I with SPC, has mwc if needed ?ADLs Comments: Pt reports intermittent assist for ADL, typically MOD I-I in all ADL ?  ? ? ?Hand Dominance  ? Dominant Hand: Right ? ?  ?Extremity/Trunk Assessment  ? Upper Extremity Assessment ?Upper Extremity Assessment: Generalized weakness ?  ? ?Lower Extremity Assessment ?Lower Extremity Assessment: Generalized weakness ?  ? ?Cervical / Trunk Assessment ?Cervical / Trunk Assessment: Kyphotic  ?Communication  ? Communication: HOH  ?Cognition Arousal/Alertness: Awake/alert ?Behavior During Therapy: [facility] for tasks assessed/performed ?Overall Cognitive Status: Within Functional Limits for tasks assessed ?  ?  ?  ?  ?  ?  ?  ?  ?  ?  ?  ?  ?  ?  ?  ?  ?General Comments: alert and oriented x4, good awareness of deficits ?  ?  ? ?  ?General Comments General comments (skin integrity, edema, etc.): spo2 down to low 80s on 7L [place] during ambulatory transfer however back up ot 97% via 6L [place] seated in chair at end of evaluation ? ?  ?Exercises    ? ?Assessment/Plan  ?  ?PT Assessment Patient needs continued PT services  ?PT Problem List Decreased strength;Decreased mobility;Decreased safety awareness;Decreased range of motion;Decreased coordination;Decreased activity tolerance;Decreased balance;Cardiopulmonary status limiting activity ? ?   ?  ?PT Treatment Interventions DME instruction;Therapeutic exercise;Gait training;Balance  training;Stair training;Neuromuscular re-education;Functional mobility training;Therapeutic activities;Patient/family education   ? ?PT Goals (Current goals can be found in the Care Plan section)  ?Acute Rehab PT Goals ?Patient Stated Goal: to get stronger to go home ?PT Goal Formulation: With patient ?Time For Goal Achievement: 04/13/22 ?Potential to Achieve Goals: Good ? ?  ?Frequency Min 2X/week ?  ? ? ?Co-evaluation   ?  ?  ?  ?  ? ? ?  ?AM-PAC PT "6 Clicks" Mobility  ?Outcome Measure Help needed turning from your back to your side while in a flat bed without using bedrails?: A Little ?Help needed moving from lying on your back to sitting on the side of a flat bed without using bedrails?: A Little ?Help needed moving to and from a bed to a chair (including a wheelchair)?: A Little ?Help needed standing up from a chair using your arms (e.g., wheelchair or bedside chair)?: A Little ?Help needed to walk in hospital room?: A Little ?Help needed climbing 3-5 steps with a railing? : A Lot ?6 Click Score: 17 ? ?  ?End of Session Equipment Utilized During Treatment: Gait belt ?Activity Tolerance: Patient tolerated treatment well;Patient limited by fatigue ?Patient left: in bed;with call bell/phone within reach;with bed alarm set ?Nurse Communication: Mobility status ?PT Visit Diagnosis: Unsteadiness on  feet (R26.81);Muscle weakness (generalized) (M62.81) ?  ? ?Time: 2025-4270 ?PT Time Calculation (min) (ACUTE ONLY): 28 min ? ? ?Charges:     ?  ?  ?   ? ? ?Jonnie Kind, SPT ?03/30/2022, 12:13 PM ? ?

## 2022-03-31 ENCOUNTER — Inpatient Hospital Stay: Payer: Medicare PPO

## 2022-03-31 DIAGNOSIS — I509 Heart failure, unspecified: Secondary | ICD-10-CM | POA: Diagnosis not present

## 2022-03-31 LAB — BASIC METABOLIC PANEL
Anion gap: 12 (ref 5–15)
BUN: 95 mg/dL — ABNORMAL HIGH (ref 8–23)
CO2: 20 mmol/L — ABNORMAL LOW (ref 22–32)
Calcium: 7.7 mg/dL — ABNORMAL LOW (ref 8.9–10.3)
Chloride: 103 mmol/L (ref 98–111)
Creatinine, Ser: 4.66 mg/dL — ABNORMAL HIGH (ref 0.61–1.24)
GFR, Estimated: 11 mL/min — ABNORMAL LOW (ref >60)
Glucose, Bld: 96 mg/dL (ref 70–99)
Potassium: 4.2 mmol/L (ref 3.5–5.1)
Sodium: 135 mmol/L (ref 135–145)

## 2022-03-31 LAB — CBC
HCT: 26.6 % — ABNORMAL LOW (ref 39.0–52.0)
Hemoglobin: 8.8 g/dL — ABNORMAL LOW (ref 13.0–17.0)
MCH: 30.2 pg (ref 26.0–34.0)
MCHC: 33.1 g/dL (ref 30.0–36.0)
MCV: 91.4 fL (ref 80.0–100.0)
Platelets: 130 10*3/uL — ABNORMAL LOW (ref 150–400)
RBC: 2.91 MIL/uL — ABNORMAL LOW (ref 4.22–5.81)
RDW: 14.6 % (ref 11.5–15.5)
WBC: 9.9 10*3/uL (ref 4.0–10.5)
nRBC: 0 % (ref 0.0–0.2)

## 2022-03-31 MED ORDER — IPRATROPIUM-ALBUTEROL 0.5-2.5 (3) MG/3ML IN SOLN
3.0000 mL | Freq: Three times a day (TID) | RESPIRATORY_TRACT | Status: DC
  Administered 2022-04-01 – 2022-04-05 (×13): 3 mL via RESPIRATORY_TRACT
  Filled 2022-03-31 (×14): qty 3

## 2022-03-31 MED ORDER — ISOSORB DINITRATE-HYDRALAZINE 20-37.5 MG PO TABS
1.0000 | ORAL_TABLET | Freq: Three times a day (TID) | ORAL | Status: DC
Start: 2022-03-31 — End: 2022-04-05
  Administered 2022-03-31 – 2022-04-05 (×15): 1 via ORAL
  Filled 2022-03-31 (×17): qty 1

## 2022-03-31 NOTE — Consult Note (Signed)
?[facility]  ?CONSULT NOTE  ? ? ?Date: 03/31/2022      ?      ?      ?Patient Name:  Glenn Yuan Sr.  MRN: [id number]  ?DOB: November 11, 1927  Age / Sex: 88 y.o., male   ?      ?PCP: Mikey Kirschner, PA-C     ?      ?      ?Service Requesting Consult: TRH     ?      ?      ?Reason for Consult: Acute kidney injury     ?      ? ?History of Present Illness: ?[name] is a 88 y.o.  male with past medical conditions including uncontrolled hypertension, gout, hypothyroidism, and chronic kidney disease stage II, who was admitted to [facility] on 03/28/2022 for Shortness of breath [R06.02] ?CHF (congestive heart failure) ([place]) [I50.9] ? ?Patient was initially referred to emergency department by her primary care physician due to concerns of bilateral lower lobe groundglass opacities seen on x-ray, concerning for pneumonia.  Patient prescribed doxycycline and discharged at that time.  Patient referred back to the emergency department after concerning labs of BMP greater than 47,000.  Patient returns via EMS and was found to have oxygen saturations of 84% on room air.  Placed on supplemental oxygen, 5 L.  Patient is poor historian and son at bedside.  Patient and son state that his swelling began over a month ago with shortness of breath recently denies cough or known fever or chills.  Reports intermittent nausea and vomiting with poor appetite.  Denies chest pain or other discomfort. ? ?Labs on ED arrival include bicarb 20, glucose 106, BUN 54, and creatinine 3.92 with GFR 14.  Baseline creatinine appears to be 3.26. respiratory panel negative for influenza and COVID-19.  Blood cultures pending.  Checks x-ray shows airspace disease bilaterally.  Abdominal ultrasound shows possible ascites.Echo shows EF 25 to 30% with global hypokinesis, mild LVH, and a grade 3 diastolic dysfunction.  Right systolic function moderately reduced with a severely enlarged right ventricle. ? ? ?Medications: ?Outpatient  medications: ?Medications Prior to Admission  ?Medication Sig Dispense Refill Last Dose  ? allopurinol (ZYLOPRIM) 100 MG tablet TAKE 1/2 TABLET BY MOUTH ONCE DAILY 30 tablet 3   ? amiodarone (PACERONE) 200 MG tablet Take 1 tablet (200 mg total) by mouth daily. Please schedule office visit with cardiology. 30 tablet 0   ? amLODipine (NORVASC) 5 MG tablet Take 1 tablet (5 mg total) by mouth daily. 90 tablet 3   ? colchicine-probenecid 0.5-500 MG tablet Take 1 tablet by mouth daily. 90 tablet 3   ? doxycycline (VIBRA-TABS) 100 MG tablet Take 1 tablet (100 mg total) by mouth 2 (two) times daily. 10 tablet 0   ? ferrous sulfate 325 (65 FE) MG tablet Take 1 tablet (325 mg total) by mouth every other day. 30 tablet 0   ? furosemide (LASIX) 40 MG tablet Take 1 tablet (40 mg total) by mouth daily. 30 tablet 3   ? levothyroxine (SYNTHROID) 112 MCG tablet TAKE 1 TABLET BY MOUTH ONCE DAILY ON AN EMPTY STOMACH. WAIT 30 MINUTES BEFORE TAKING OTHER MEDS. 90 tablet 0   ? Multiple Vitamins-Iron (MULTIVITAMINS WITH IRON) TABS tablet Take 1 tablet by mouth daily. 90 tablet 1   ? pantoprazole (PROTONIX) 40 MG tablet Take 1 tablet (40 mg total) by mouth daily. 90 tablet 1   ?  traZODone (DESYREL) 50 MG tablet Take 1 tablet (50 mg total) by mouth at bedtime. Please schedule office visit before any future refill. 30 tablet 0   ? ? ?Current medications: ?Current Facility-Administered Medications  ?Medication Dose Route Frequency Provider Last Rate Last Admin  ? 0.9 %  sodium chloride infusion  250 mL Intravenous PRN Clance Boll, MD      ? acetaminophen (TYLENOL) tablet 650 mg  650 mg Oral Q4H PRN Clance Boll, MD      ? aspirin EC tablet 81 mg  81 mg Oral Daily Myles Rosenthal A, MD   81 mg at 03/31/22 1051  ? carvedilol (COREG) tablet 6.25 mg  6.25 mg Oral BID WC Myles Rosenthal A, MD   6.25 mg at 03/31/22 1051  ? doxycycline (VIBRA-TABS) tablet 100 mg  100 mg Oral Q12H Benita Gutter, RPH   100 mg at 03/31/22 1051  ?  furosemide (LASIX) injection 60 mg  60 mg Intravenous Q12H Myles Rosenthal A, MD   60 mg at 03/31/22 1402  ? heparin injection 5,000 Units  5,000 Units Subcutaneous Q8H Clance Boll, MD   5,000 Units at 03/31/22 1050  ? ipratropium-albuterol (DUONEB) 0.5-2.5 (3) MG/3ML nebulizer solution 3 mL  3 mL Nebulization Q6H Hall, Carole N, DO   3 mL at 03/31/22 1300  ? isosorbide-hydrALAZINE (BIDIL) 20-37.5 MG per tablet 1 tablet  1 tablet Oral TID Scoggins, Amber, NP   1 tablet at 03/31/22 1051  ? levothyroxine (SYNTHROID) tablet 112 mcg  112 mcg Oral Q0600 Kayleen Memos, DO   112 mcg at 03/31/22 0555  ? ondansetron (ZOFRAN) injection 4 mg  4 mg Intravenous Q6H PRN Clance Boll, MD      ? piperacillin-tazobactam (ZOSYN) IVPB 2.25 g  2.25 g Intravenous Q8H Rauer, Forde Dandy, RPH   Stopped at 03/31/22 7425  ? sodium chloride flush (NS) 0.9 % injection 3 mL  3 mL Intravenous Q12H Myles Rosenthal A, MD   3 mL at 03/31/22 1051  ? sodium chloride flush (NS) 0.9 % injection 3 mL  3 mL Intravenous PRN Clance Boll, MD      ?  ? ? ?Allergies: ?Allergies  ?Allergen Reactions  ? Codeine   ? Cortizone-10  [Hydrocortisone]   ?  ? ? ?Past Medical History: ?Past Medical History:  ?Diagnosis Date  ? Arthritis   ? GERD (gastroesophageal reflux disease)   ? Hemorrhoid 1986  ? Hyperlipidemia   ? Hypertension   ? SVT (supraventricular tachycardia) ([place])   ? Thyroid disease   ? Vertigo   ? ? ? ?Past Surgical History: ?Past Surgical History:  ?Procedure Laterality Date  ? APPENDECTOMY  1968  ? BACK SURGERY    ? COLONOSCOPY N/A 11/27/2017  ? Procedure: COLONOSCOPY;  Surgeon: Toledo, Benay Pike, MD;  Location: [facility] ENDOSCOPY;  Service: Gastroenterology;  Laterality: N/A;  ? COLONOSCOPY WITH PROPOFOL N/A 01/04/2021  ? Procedure: COLONOSCOPY WITH PROPOFOL;  Surgeon: Lucilla Lame, MD;  Location[facility] ENDOSCOPY;  Service: Endoscopy;  Laterality: N/A;  ? ESOPHAGOGASTRODUODENOSCOPY (EGD) WITH PROPOFOL N/A 11/25/2017  ? Procedure:  ESOPHAGOGASTRODUODENOSCOPY (EGD) WITH PROPOFOL;  Surgeon: Toledo, Benay Pike, MD;  Location: [facility] ENDOSCOPY;  Service: Gastroenterology;  Laterality: N/A;  ? EXCISIONAL HEMORRHOIDECTOMY    ? INGUINAL HERNIA REPAIR Right 03/17/2016  ? Procedure: LAPAROSCOPIC INGUINAL HERNIA;  Surgeon: Florene Glen, MD;  Location: [facility] ORS;  Service: General;  Laterality: Right;  ? [place]  ? ? ? ?  Family History: ?Family History  ?Problem Relation Age of Onset  ? Alzheimer's disease Father   ? Healthy Sister   ? ? ? ?Social History: ?Social History  ? ?Socioeconomic History  ? Marital status: Widowed  ?  Spouse name: Not on file  ? Number of children: Not on file  ? Years of education: Not on file  ? Highest education level: Not on file  ?Occupational History  ? Occupation: retired  ?Tobacco Use  ? Smoking status: Former  ?  Packs/day: 2.00  ?  Years: 25.00  ?  Pack years: 50.00  ?  Types: Cigarettes  ?  Quit date: 12/28/1965  ?  Years since quitting: 56.2  ? Smokeless tobacco: Never  ?Vaping Use  ? Vaping Use: Never used  ?Substance and Sexual Activity  ? Alcohol use: No  ?  Comment: 1 beer occasionally  ? Drug use: No  ? Sexual activity: Not Currently  ?Other Topics Concern  ? Not on file  ?Social History Narrative  ? Not on file  ? ?Social Determinants of Health  ? ?Financial Resource Strain: Not on file  ?Food Insecurity: Not on file  ?Transportation Needs: Not on file  ?Physical Activity: Not on file  ?Stress: Not on file  ?Social Connections: Not on file  ?Intimate Partner Violence: Not on file  ? ? ? ?Review of Systems: ?Review of Systems  ?Constitutional:  Negative for chills, fever and malaise/fatigue.  ?HENT:  Negative for congestion, sore throat and tinnitus.   ?Eyes:  Negative for blurred vision and redness.  ?Respiratory:  Positive for shortness of breath. Negative for cough and wheezing.   ?Cardiovascular:  Positive for leg swelling. Negative for chest pain, palpitations and claudication.   ?Gastrointestinal:  Negative for abdominal pain, blood in stool, diarrhea, nausea and vomiting.  ?Genitourinary:  Negative for flank pain, frequency and hematuria.  ?Musculoskeletal:  Negative for back pain, falls and my

## 2022-03-31 NOTE — Evaluation (Signed)
Clinical/Bedside Swallow Evaluation ?Patient Details  ?Name: Glenn Boliver Sr. ?MRN: [id number] ?Date of Birth: 1927/10/24 ? ?Today's Date: 03/31/2022 ?Time: SLP Start Time (ACUTE ONLY): 0840 SLP Stop Time (ACUTE ONLY): 0900 ?SLP Time Calculation (min) (ACUTE ONLY): 20 min ? ?Past Medical History:  ?Past Medical History:  ?Diagnosis Date  ? Arthritis   ? GERD (gastroesophageal reflux disease)   ? Hemorrhoid 1986  ? Hyperlipidemia   ? Hypertension   ? SVT (supraventricular tachycardia) ([name])   ? Thyroid disease   ? Vertigo   ? ?Past Surgical History:  ?Past Surgical History:  ?Procedure Laterality Date  ? APPENDECTOMY  1968  ? BACK SURGERY    ? COLONOSCOPY N/A 11/27/2017  ? Procedure: COLONOSCOPY;  Surgeon: Glenn Jarvis;  Location: [facility] ENDOSCOPY;  Service: Gastroenterology;  Laterality: N/A;  ? COLONOSCOPY WITH PROPOFOL N/A 01/04/2021  ? Procedure: COLONOSCOPY WITH PROPOFOL;  Surgeon: Glenn Jarvis;  Location: [facility] ENDOSCOPY;  Service: Endoscopy;  Laterality: N/A;  ? ESOPHAGOGASTRODUODENOSCOPY (EGD) WITH PROPOFOL N/A 11/25/2017  ? Procedure: ESOPHAGOGASTRODUODENOSCOPY (EGD) WITH PROPOFOL;  Surgeon: Glenn Jarvis;  Location: [facility] ENDOSCOPY;  Service: Gastroenterology;  Laterality: N/A;  ? EXCISIONAL HEMORRHOIDECTOMY    ? INGUINAL HERNIA REPAIR Right 03/17/2016  ? Procedure: LAPAROSCOPIC INGUINAL HERNIA;  Surgeon: Glenn Jarvis;  Location: [facility] ORS;  Service: General;  Laterality: Right;  ? [place]  ? ?HPI:  ?Per Physician's H&P "Glenn Hayashi. is a 88 y.o. male with medical history significant of hypothyroidism, ,Hx of SVT, Uncontrolled HTN, CKDII, Gout, who has interim history of diagnosis of CAP after presenting to pcp 3/30 for respiratory symptoms. Xray results at that time noted bilateral lower lobe ground-glass opacities, right worse than the left concerning for pneumonia. He was started on doxycycline and sent home. However today further labs resulted with most concerning  being nt-probnp of 47,859. Patient  was then referred to ED by PCP. Patient BIB EMS in the field it was noted that patient has sat of 84% on ra. He was placed on 5L [place] improved to 90's and transported to ED. Per patient notes that his feel has been swelling for over one month. However he notes he became short of breath 2 days prior to seeing his pcp.  He notes no coughing no fever or chills. He does however endorse poor intake due to low appetite and episode of nausea and vomitting.  On further ros he notes no abdominal pain no diarrhea, no dysuria , no palpitations no chest discomfort, or presyncope." CXR 03/30/22 "FINDINGS:  Aortic atherosclerotic calcifications. Stable cardiomediastinal  contours. No pneumothorax identified. Diffuse bilateral interstitial  and airspace opacities are unchanged when compared with the previous  exam.     IMPRESSION:  No change in aeration to the lungs compared with previous exam."  ?  ?Assessment / Plan / Recommendation  ?Clinical Impression ? Pt seen for clinical swallowing evaluation. Pt alert, pleasant, and cooperative. Consuming breakfast upon SLP entrance to room. Pt on 6L/min O2 via [place]. Pt denies difficulty swallowing. Son present for end of evaluation.  ? ?Oral motor exam completed and significant for loose dentures and generalized oral motor weakness.  ? ?Per chart review, temp WNL and WBC WNL. CXR 03/30/22 "FINDINGS: Aortic atherosclerotic calcifications. Stable cardiomediastinal contours. No pneumothorax identified. Diffuse bilateral interstitial and airspace opacities are unchanged when compared with the previous exam. IMPRESSION: No change in aeration to the lungs compared with previous exam." ? ?Pt observed with  trials of regular solid, pureed, and thin liquids (via cup/straw). Pt able to feed self without difficulty. Pt benefited from set up assistance for opening containers. Pt demonstrated a grossly functional oral swallow with mildly prolonged, but functional,  mastication of solids likely due to ill-fitting dentures and generalized oral motor weakness. No overt or subtle s/sx pharyngeal dysphagia noted across trials. To palpation, pt with seemingly timely swallow initiation and seemingly adequate laryngeal elevation. No change to vocal quality noted across trials.  ? ?Recommend continuation of a regular diet with thin liquids and safe swallowing strategies/aspiration precautions as outlined below.  ? ?Suspect pt is at or near swallowing baseline. SLP to sign off at this time. ? ?Pt, pt's son, and RN made aware of results, recommendations, and SLP POC. Pt and son verbalized understanding/agreement.  ? ?SLP Visit Diagnosis: Dysphagia, oral phase (R13.11) ?   ?Aspiration Risk ? Mild aspiration risk  ?  ?Diet Recommendation Regular;Thin liquid  ? ?Medication Administration: Whole meds with liquid (as tolerated) ?Supervision: Patient able to self feed (meal set up) ?Compensations: Slow rate;Small sips/bites (rest breaks PRN) ?Postural Changes: Seated upright at 90 degrees;Remain upright for at least 30 minutes after po intake  ?  ?Other  Recommendations Oral Care Recommendations: Oral care BID   ? ?Recommendations for follow up therapy are one component of a multi-disciplinary discharge planning process, led by the attending physician.  Recommendations may be updated based on patient status, additional functional criteria and insurance authorization. ? ?Follow up Recommendations No SLP follow up  ? ? ?  ?Assistance Recommended at Discharge  (defer to OT/PT)  ?Functional Status Assessment Patient has not had a recent decline in their functional status  ?Frequency and Duration  (n/a)  ? (n/a) ?  ?   ? ?Prognosis Prognosis for Safe Diet Advancement: Good  ? ?  ? ?Swallow Study   ?General Date of Onset: 03/28/22 ?HPI: Per 22 H&P "Glenn Zapata. is a 88 y.o. male with medical history significant of hypothyroidism, ,Hx of SVT, Uncontrolled HTN, CKDII, Gout, who has  interim history of diagnosis of CAP after presenting to pcp 3/30 for respiratory symptoms. Xray results at that time noted bilateral lower lobe ground-glass opacities, right worse than the left concerning for pneumonia. He was started on doxycycline and sent home. However today further labs resulted with most concerning being nt-probnp of 47,859. Patient  was then referred to ED by PCP. Patient BIB EMS in the field it was noted that patient has sat of 84% on ra. He was placed on 5L [place] improved to 90's and transported to ED. Per patient notes that his feel has been swelling for over one month. However he notes he became short of breath 2 days prior to seeing his pcp.  He notes no coughing no fever or chills. He does however endorse poor intake due to low appetite and episode of nausea and vomitting.  On further ros he notes no abdominal pain no diarrhea, no dysuria , no palpitations no chest discomfort, or presyncope." CXR 03/30/22 "FINDINGS:  Aortic atherosclerotic calcifications. Stable cardiomediastinal  contours. No pneumothorax identified. Diffuse bilateral interstitial  and airspace opacities are unchanged when compared with the previous  exam.     IMPRESSION:  No change in aeration to the lungs compared with previous exam." ?Type of Study: Bedside Swallow Evaluation ?Previous Swallow Assessment: unknown ?Diet Prior to this Study: Regular;Thin liquids ?Temperature Spikes Noted: No ?Respiratory Status: Nasal cannula (6L/min) ?History of Recent Intubation: No ?Behavior/Cognition:  Alert;Cooperative;Pleasant mood ?Oral Cavity Assessment: Within Functional Limits ?Oral Care Completed by SLP: Recent completion by staff ?Oral Cavity - Dentition: Dentures, bottom;Dentures, not available ?Vision: Functional for self-feeding ?Self-Feeding Abilities: Able to feed self;Needs set up ?Patient Positioning: Upright in bed ?Baseline Vocal Quality: Normal ?Volitional Cough: Strong ?Volitional Swallow: Able to elicit  ?   ?Oral/Motor/Sensory Function Overall Oral Motor/Sensory Function: Generalized oral weakness   ?Thin Liquid Thin Liquid: Within functional limits ?Presentation: Cup;Straw;Self Fed ?Other Comments: ~3 oz total  ?  ?Puree Pure

## 2022-03-31 NOTE — Progress Notes (Signed)
? ?PROGRESS NOTE ? ?Glenn Mulling Sr. ZDG:[id number] DOB: 09/04/1927 DOA: 03/28/2022 ?PCP: Mikey Kirschner, PA-C ? ?HPI/Recap of past 24 hours: ?Glenn Punch. is a 88 y.o. male with medical history significant of hypothyroidism, SVT, Uncontrolled HTN, CKD IV, Gout, recent diagnosis of CAP after presenting to pcp 3/30 for respiratory symptoms. Xray results at that time noted bilateral lower lobe ground-glass opacities, right worse than the left concerning for pneumonia. He was started on doxycycline and sent home.  ? ?Patient was referred to ED by PCP due to abnormal labs, proBNP of 47,859.  Patient was BIB EMS.  In the field he was noted to be hypoxic with O2 saturation of 84% on room air.   He was placed on 5L [place] improved to the 90's and transported to the ED.  Patient endorses bilateral lower extremity edema for 1 month.  Associated with dyspnea with minimal exertion.  Denies any chest pain.  ? ?Chest x-ray done on 03/28/2022 showed diffuse pulmonary infiltrates and edema.  He is currently on IV diuretics and IV antibiotics.  Repeated chest x-ray on 03/30/2022, showing no significant improvement.  Procalcitonin up trended to 1.29 from 0.60.  Due to concern for worsening pneumonia escalated IV antibiotics to Zosyn.  MRSA screening test ordered and pending.  Hypoxemia on ABG 6 L HFNC, pH 7.04/27/73/18 taken on 4/2.  Speech therapist consulted to rule out dysphagia, ruled out with recommendation for regular consistency thin liquids. ? ?Seen by PT OT with recommendation for SNF.  Patient prefers to go home with home health services and DME's.  TOC consulted to assist with discharge planning.  Patient still requires hospitalization due to severe hypoxemia and worsening AKI on CKD 4. ? ?03/31/2022: Patient was seen and examined at his bedside.  States he feels better today.  Breathing is improved.  Bilateral lower extremity edema is also improved.  He denies having any chest pain or  palpitations. ? ? ?Assessment/Plan: ?Principal Problem: ?  CHF (congestive heart failure) ([place]) ? ?Acute combined diastolic and systolic CHF, LVEF 25 to 59%, grade 3 diastolic dysfunction on 2D echo 03/29/2022. ?proBNP greater than 47,000, pulmonary edema seen on chest x-ray. ?On IV diuretics per cardiology 60 mg twice daily. ?2D echo completed on 03/29/2022 ?Appreciate cardiology's assistance ?Continue cardiac medications as recommended by cardiology ?Continue strict I's and O's and daily weight. ?Net I&O -3.3 L ?  ?Multifocal CAP, POA ?Personally reviewed chest x-ray done on admission which showed bilateral pulmonary infiltrates.  Repeated chest x-ray on 03/30/2022 to see progression, no significant improvement. ?Procalcitonin uptrending.  Concern for worsening pneumonia ?This patient was ruled out by speech therapist evaluation on 03/31/2022.   ?Rocephin and doxycycline discontinued on 03/30/2022. ?IV antibiotics escalated to Zosyn on 03/30/2022. ?MRSA screening test ordered and pending. ?Continue incentive spirometer, flutter valve. ?Continue bronchodilators and pulmonary toilet ?Obtain sputum culture, and follow for ID and sensitivities to narrow down antibiotics ?Continue to follow-up blood cultures ? ?Acute hypoxic respiratory failure secondary to multifocal pneumonia ?Not on oxygen supplementation at baseline ?Currently on 6 L high flow nasal cannula ?ABG done on 03/30/2022 pH 7.04/27/73/18. ?Continue to maintain oxygen saturation greater than 92%. ? ?Worsening AKI on CKD IV, suspect prerenal in the setting of dehydration from poor oral intake ?Baseline creatinine appears to be 3.21 with GFR of 18. ?Creatinine uptrending 4.66 from 4.42 from 3.98 from 3.88. ?Nonoliguric. ?Renal ultrasound from 03/31/2022, no hydronephrosis, small diffusely echogenic kidneys favoring chronic medical renal disease, 2 small cyst of the left  kidney observed. ?Continue to avoid nephrotoxic agents and hypotension. ?Appreciate nephrology's  assistance. ? ?Resolving bilateral lower extremity edema ?Tenderness with palpation ?Rule out DVT ?Bilateral lower extremity Doppler ultrasound 03/30/22 revealed no DVT, Baker's cyst of the right popliteal fossa noted. ?  ?Hypothyroidism  ?TSH 15.6, free T4 1.59. ?Continue home levothyroxine ?  ? Hx of SVT ?-no complaints of palpitations or chest pain  ?Heart rate is stable, 67 ?  ? Uncontrolled HTN, improving. ?Currently on Coreg 6.25 mg twice daily, IV Lasix 60 mg twice daily ?Continue to closely monitor vital signs ?  ?Gout ?No acute issues. ?Avoid NSAIDs in the setting of AKI on CKD 4. ? ?Physical debility/ambulatory dysfunction ?PT assessment recommending SNF. ?Continue PT OT with assistance and fall precautions. ?Patient prefers to go home with home health services and DME's. ? ?Critical care time: 65 minutes. ? ?DVT prophylaxis: hep sq 3 times daily. ?Code Status: Full ?Family Communication: Updated his son at bedside. ?Disposition Plan: Likely will discharge to home with home health services once cardiology and nephrology sign off and oxygen requirement is improved to less than 5 L nasal cannula. ? ?Consults called: cardiology  ?Admission status: inpatient ?  ? ? ?Status is: Inpatient ?Patient requires at least 2 midnights for further evaluation and treatment of present condition. ? ? ? ?Objective: ?Vitals:  ? 03/31/22 0419 03/31/22 0732 03/31/22 0942 03/31/22 1300  ?BP: (!) 150/79  (!) 147/74   ?Pulse: 72  71   ?Resp: 17  (!) 22   ?Temp: 98 ?F (36.7 ?C)  97.8 ?F (36.6 ?C)   ?TempSrc: Oral     ?SpO2: 93% 94% 91% 90%  ?Weight:      ?Height:      ? ? ?Intake/Output Summary (Last 24 hours) at 03/31/2022 1619 ?Last data filed at 03/31/2022 1358 ?Gross per 24 hour  ?Intake 510 ml  ?Output 2100 ml  ?Net -1590 ml  ? ?Filed Weights  ? 03/29/22 0343 03/30/22 0423 03/31/22 0415  ?Weight: 56.7 kg 59.9 kg 62.5 kg  ? ? ?Exam: ? ?General: 88 y.o. year-old male frail-appearing in no acute distress.  He is alert and oriented x3.   Very hard of hearing.   ?Cardiovascular: Regular rate and rhythm no rubs or gallops. ?Respiratory: Diffuse rales bilaterally, improved.  No wheezing noted.  Poor inspiratory effort.   ?Abdomen: Soft noted normal bowel sounds present. ?Musculoskeletal: Trace lower extremity edema bilaterally ?Skin: No ulcerative lesions noted. ?Psychiatry: Mood is appropriate for condition and setting. ?Neuro: Awake and alert.  Nonfocal exam ? ? ?Data Reviewed: ?CBC: ?Recent Labs  ?Lab 03/27/22 ?1315 03/28/22 ?1330 03/28/22 ?2333 03/30/22 ?0434 03/31/22 ?6387  ?WBC 9.2 9.5 10.4 9.9 9.9  ?NEUTROABS 7.7* 8.1*  --   --   --   ?HGB 9.7* 9.2* 9.9* 8.4* 8.8*  ?HCT 29.5* 28.6* 30.4* 25.4* 26.6*  ?MCV 93 95.7 93.3 93.4 91.4  ?PLT 126* 131* 135* 123* 130*  ? ?Basic Metabolic Panel: ?Recent Labs  ?Lab 03/27/22 ?1315 03/28/22 ?1330 03/28/22 ?2333 03/29/22 ?0422 03/30/22 ?0434 03/31/22 ?5643  ?NA 137 136  --  139 137 135  ?K 5.0 4.9  --  5.4* 4.8 4.2  ?CL 104 104  --  107 105 103  ?CO2 16* 20*  --  22 22 20*  ?GLUCOSE 115* 106*  --  86 102* 96  ?BUN 47* 54*  --  64* 80* 95*  ?CREATININE 3.46* 3.92* 3.88* 3.98* 4.42* 4.66*  ?CALCIUM 8.7 8.5*  --  8.5* 7.8*  7.7*  ?MG  --   --   --   --  1.8  --   ?PHOS  --   --   --   --  5.2*  --   ? ?GFR: ?Estimated Creatinine Clearance: 8.8 mL/min (A) (by C-G formula based on SCr of 4.66 mg/dL (H)). ?Liver Function Tests: ?Recent Labs  ?Lab 03/27/22 ?1315 03/28/22 ?1330  ?AST 21 229*  ?ALT 9 152*  ?ALKPHOS 150* 222*  ?BILITOT 0.6 1.1  ?PROT 6.6 6.8  ?ALBUMIN 3.4* 2.7*  ? ?Recent Labs  ?Lab 03/28/22 ?2333  ?LIPASE 21  ? ?No results for input(s): AMMONIA in the last 168 hours. ?Coagulation Profile: ?No results for input(s): INR, PROTIME in the last 168 hours. ?Cardiac Enzymes: ?No results for input(s): CKTOTAL, CKMB, CKMBINDEX, TROPONINI in the last 168 hours. ?BNP (last 3 results) ?Recent Labs  ?  03/27/22 ?1315  ?PROBNP [id number]*  ? ?HbA1C: ?No results for input(s): HGBA1C in the last 72 hours. ?CBG: ?No results  for input(s): GLUCAP in the last 168 hours. ?Lipid Profile: ?No results for input(s): CHOL, HDL, LDLCALC, TRIG, CHOLHDL, LDLDIRECT in the last 72 hours. ?Thyroid Function Tests: ?Recent Labs  ?  03/28/22 ?2333  ?FREET4 1.59*  ? ?Anemia Panel: ?No

## 2022-03-31 NOTE — TOC Initial Note (Addendum)
Transition of Care (TOC) - Initial/Assessment Note  ? ? ?Patient Details  ?Name: Glenn Kirsch Sr. ?MRN: [id number] ?Date of Birth: September 18, 1927 ? ?Transition of Care (TOC) CM/SW Contact:    ?Alberteen Sam, LCSW ?Phone Number: ?03/31/2022, 9:16 AM ? ?Clinical Narrative:                 ? ?CSW called patient's daughter and POA Glenn Jarvis to review SNF recs, she reports she wishes for patient to return home which is his preference as well.  ? ?No preference of [place] agency, will need [place] PT OT RN and aide, referral sent to [facility] with [facility].  ? ?Glenn Jarvis reports patient has walker, wheel chair and 3in1 at home, no DME needs yet.  ? ?She does report patient has no home O2 at baseline, and in hospital is currently on 6L O2. TOC will follow for oxygen needs at dc.  ? ?Patient will need non emergent EMS home at time of discharge.  ? ? ? ?Expected Discharge Plan:  (TBD) ?Barriers to Discharge: Continued Medical Work up ? ? ?Patient Goals and CMS Choice ?Patient states their goals for this hospitalization and ongoing recovery are:: to go home ?CMS Medicare.gov Compare Post Acute Care list provided to:: Patient Represenative (must comment) (POA daughter Glenn Jarvis) ?Choice offered to / list presented to : [facility] POA / Guardian ? ?Expected Discharge Plan and Services ?Expected Discharge Plan:  (TBD) ?  ?  ?  ?Living arrangements for the past 2 months: [place] ?                ?  ?  ?  ?  ?  ?  ?  ?  ?  ?  ? ?Prior Living Arrangements/Services ?Living arrangements for the past 2 months: [place] ?  ?  ?       ?  ?  ?  ?  ? ?Activities of Daily Living ?  ?  ? ?Permission Sought/Granted ?  ?  ?   ?   ?   ?   ? ?Emotional Assessment ?  ?  ?  ?  ?  ?  ? ?Admission diagnosis:  Shortness of breath [R06.02] ?CHF (congestive heart failure) ([place]) [I50.9] ?Patient Active Problem List  ? Diagnosis Date Noted  ? CHF (congestive heart failure) ([place]) 03/28/2022  ? Ulcer of right second toe ([place]) 02/14/2022  ? Severe anemia 01/04/2021  ?  Lower GI bleed   ? Normocytic anemia   ? Neuropathy 04/06/2018  ? Benign hypertension with CKD (chronic kidney disease) stage III ([place]) 04/06/2018  ? Acute gouty arthritis 04/06/2018  ? SVT (supraventricular tachycardia) ([place]) 04/06/2018  ? Sustained SVT ([place]) 03/31/2018  ? PSVT (paroxysmal supraventricular tachycardia) ([place]) 03/29/2018  ? Syncope and collapse 11/24/2017  ? Edema 10/05/2015  ? Allergic rhinitis 07/05/2015  ? Arthritis 07/05/2015  ? Benign hypertension 07/05/2015  ? Acid reflux 07/05/2015  ? Gout 07/05/2015  ? HLD (hyperlipidemia) 07/05/2015  ? Adult hypothyroidism 07/05/2015  ? Cannot sleep 07/05/2015  ? Hernia, inguinal 07/05/2015  ? LBP (low back pain) 07/05/2015  ? Cramp in muscle 07/05/2015  ? Leg paresthesia 07/05/2015  ? CA of prostate ([place]) 07/05/2015  ? Cutaneous eruption 07/05/2015  ? Head revolving around 07/05/2015  ? Hypothyroidism 07/03/2015  ? Right inguinal hernia 05/30/2014  ? ?PCP:  Mikey Kirschner, PA-C ?Pharmacy:   ?[facility] ?[address]. ?[place] [address] ?Phone:  [phone] Fax: [phone] ? ? ? ? ?Social Determinants of Health (SDOH) Interventions ?  ? ?Readmission Risk Interventions ?   ? View : No data to display.  ?  ?  ?  ? ? ? ?

## 2022-03-31 NOTE — Progress Notes (Signed)
Mobility Specialist - Progress Note ? ? 03/31/22 1318  ?Mobility  ?Activity Turned to right side;Turned to left side;Turned to back - supine  ?Level of Assistance Minimal assist, patient does 75% or more  ?Assistive Device None  ?Distance Ambulated (ft) 0 ft  ?$Mobility charge 1 Mobility  ? ? ? ?During mobility: 64 HR, 86% SpO2 ? ?Pt was lying in bed upon arrival, utilizing 6L. Son at bedside. Pt voiced soiled bed sheets d/t external catheter sliding off. RT entered to administer breathing tx. NT provided new external catheter. Pt rolled L/R with minA for new bed linen change. O2 sitting at 86% during activity. Further activity deferred d/t pt being transported to [place] at this time.  ? ? ?Kathee Delton ?Mobility Specialist ?03/31/22, 1:22 PM ? ? ?

## 2022-03-31 NOTE — Care Management Important Message (Signed)
Important Message ? ?Patient Details  ?Name: Glenn Foti Sr. ?MRN: [id number] ?Date of Birth: [birth year] ? ? ?Medicare Important Message Given:  Yes ? ? ? ? ?Dannette Barbara ?03/31/2022, 11:49 AM ?

## 2022-03-31 NOTE — Progress Notes (Signed)
Occupational Therapy Treatment ?Patient Details ?Name: Glenn Study Sr. ?MRN: [id number] ?DOB: [birth year] ?Today's Date: 03/31/2022 ? ? ?History of present illness Pt is a 88 year old male presenting to pcp 3/30 for respiratory symptoms. Xray results at that time noted bilateral lower lobe ground-glass opacities, right worse than the left concerning for pneumonia. He was started on doxycycline and sent home. Further labs resulted in patient being referred to ED from PCP. PMH significant for hypothyroidism, Hx of SVT, Uncontrolled HTN, CKDIV, Gout, who has interim history of diagnosis of CAP ?  ?OT comments ? Pt. was awake, visiting with his son, and had just finished breakfast upon arrival. Pt. performed supine to sit with minA to move out of the center of the bed, as pt. Reports he feels like there is a hole in the center of the bed that is hard for him to move out of. Pt. required Supervision sit to supine. Pt. Required MinA for gown management. Pt. Required minA sit to stand at the side of the bed. Pt. Was able to step forward, and side step towards the [facility] with Min guard using the walker, minA to step backwards to the bed. Pt.'s external condom catheter slid off when standing. Nursing was notified, and arrived to assess the pt. Pt. was assisted back to bed, and made comfortable. SPO2 89-94%, HR 74 bpms. Pt. education was provided about pursed lip breathing techniques, energy conservation, and work simplification techniques. A visual handout was provided to the pt., and son. Pt. will benefit from OT services for ADL training, A/E training, and pt. education about energy conservation, work simplification, home modification, and DME.  ? ?Recommendations for follow up therapy are one component of a multi-disciplinary discharge planning process, led by the attending physician.  Recommendations may be updated based on patient status, additional functional criteria and insurance authorization. ?   ?Follow Up  Recommendations ? Skilled nursing-short term rehab (<3 hours/day)  ?  ?Assistance Recommended at Discharge Frequent or constant Supervision/Assistance  ?Patient can return home with the following ? A little help with walking and/or transfers;A lot of help with bathing/dressing/bathroom;Assistance with cooking/housework;Direct supervision/assist for financial management;Assistance with feeding;Assist for transportation;Help with stairs or ramp for entrance ?  ?Equipment Recommendations ?    ?  ?Recommendations for Other Services   ? ?  ?Precautions / Restrictions Precautions ?Precautions: Fall ?Precaution Comments: watch spo2 ?Restrictions ?Weight Bearing Restrictions: No  ? ? ?  ? ?Mobility Bed Mobility ?Overal bed mobility: Needs Assistance ?Bed Mobility: Sit to Supine ?  ?  ?Supine to sit: HOB elevated, Min assist ?Sit to supine: Supervision ?  ?  ?  ? ?Transfers ?  ?Equipment used: Rolling walker (2 wheels) ?Transfers: Sit to/from Stand ?Sit to Stand: Min assist ?  ?  ?  ?  ?  ?  ?  ?  ?Balance   ?  ?  ?  ?  ?  ?  ?  ?  ?  ?  ?  ?  ?  ?  ?  ?  ?  ?  ?   ? ?ADL either performed or assessed with clinical judgement  ? ?ADL   ?  ?  ?  ?  ?  ?  ?  ?  ?Upper Body Dressing : Minimal assistance ?  ?Lower Body Dressing: Maximal assistance;Bed level ?  ?  ?  ?  ?  ?  ?  ?Functional mobility during ADLs: Minimal assistance;Rolling walker (2 wheels) ?  ?  ? ?  Extremity/Trunk Assessment Upper Extremity Assessment ?Upper Extremity Assessment: Generalized weakness ?  ?  ?  ?  ?  ? ?Vision Patient Visual Report: No change from baseline ?  ?  ?Perception   ?  ?Praxis   ?  ? ?Cognition Arousal/Alertness: Awake/alert ?Behavior During Therapy: [facility] for tasks assessed/performed ?Overall Cognitive Status: Within Functional Limits for tasks assessed ?  ?  ?  ?  ?  ?  ?  ?  ?  ?  ?  ?  ?  ?  ?  ?  ?General Comments: alert and oriented x4, good awareness of deficits ?  ?  ?   ?Exercises   ? ?  ?Shoulder Instructions   ? ? ?  ?General  Comments    ? ? ?Pertinent Vitals/ Pain       Pain Assessment ?Pain Assessment: No/denies pain ? ?Home Living   ?  ?  ?  ?  ?  ?  ?  ?  ?  ?  ?  ?  ?  ?  ?  ?  ?  ?  ? ?  ?Prior Functioning/Environment    ?  ?  ?  ?   ? ?Frequency ? Min 3X/week  ? ? ? ? ?  ?Progress Toward Goals ? ?OT Goals(current goals can now be found in the care plan section) ? Progress towards OT goals: Progressing toward goals ? ?Acute Rehab OT Goals ?Patient Stated Goal: To return home ?OT Goal Formulation: With patient/family ?Time For Goal Achievement: 04/13/22 ?Potential to Achieve Goals: Fair  ?Plan     ? ?Co-evaluation ? ? ?   ?  ?  ?  ?  ? ?  ?AM-PAC OT "6 Clicks" Daily Activity     ?Outcome Measure ? ? Help from another person eating meals?: None ?Help from another person taking care of personal grooming?: None ?Help from another person toileting, which includes using toliet, bedpan, or urinal?: A Little ?Help from another person bathing (including washing, rinsing, drying)?: A Lot ?Help from another person to put on and taking off regular upper body clothing?: A Little ?Help from another person to put on and taking off regular lower body clothing?: A Lot ?6 Click Score: 18 ? ?  ?End of Session Equipment Utilized During Treatment: Gait belt;Rolling walker (2 wheels) ? ?OT Visit Diagnosis: Unsteadiness on feet (R26.81);Muscle weakness (generalized) (M62.81) ?  ?Activity Tolerance Patient tolerated treatment well ?  ?Patient Left in chair;with call bell/phone within reach;with chair alarm set ?  ?Nurse Communication Mobility status ?  ? ?   ? ?Time: 1275-1700 ?OT Time Calculation (min): 34 min ? ?Charges: OT General Charges ?$OT Visit: 1 Visit ?OT Treatments ?$Self Care/Home Management : 23-37 mins ? ?Harrel Carina, MS, OTR/L  ? ?Harrel Carina ?03/31/2022, 10:33 AM ?

## 2022-03-31 NOTE — Progress Notes (Signed)
SUBJECTIVE: Glenn Jarvis. is a 88 y.o. male with medical history significant of hypothyroidism, Hx of SVT, Uncontrolled HTN, CKDIV, Gout, who has interim history of diagnosis of CAP after presenting to pcp 3/30 for respiratory symptoms.  ?  ?Denies chest pain. Shortness of breath and lower extremity edema improved ? ? ?Vitals:  ? 03/31/22 0257 03/31/22 0415 03/31/22 0419 03/31/22 0732  ?BP:   (!) 150/79   ?Pulse:   72   ?Resp:   17   ?Temp:   98 ?F (36.7 ?C)   ?TempSrc:   Oral   ?SpO2: 94%  93% 94%  ?Weight:  62.5 kg    ?Height:      ? ? ?Intake/Output Summary (Last 24 hours) at 03/31/2022 0857 ?Last data filed at 03/31/2022 0600 ?Gross per 24 hour  ?Intake 410 ml  ?Output 1300 ml  ?Net -890 ml  ? ? ?LABS: ?Basic Metabolic Panel: ?Recent Labs  ?  03/30/22 ?0434 03/31/22 ?2836  ?NA 137 135  ?K 4.8 4.2  ?CL 105 103  ?CO2 22 20*  ?GLUCOSE 102* 96  ?BUN 80* 95*  ?CREATININE 4.42* 4.66*  ?CALCIUM 7.8* 7.7*  ?MG 1.8  --   ?PHOS 5.2*  --   ? ?Liver Function Tests: ?Recent Labs  ?  03/28/22 ?1330  ?AST 229*  ?ALT 152*  ?ALKPHOS 222*  ?BILITOT 1.1  ?PROT 6.8  ?ALBUMIN 2.7*  ? ?Recent Labs  ?  03/28/22 ?2333  ?LIPASE 21  ? ?CBC: ?Recent Labs  ?  03/28/22 ?1330 03/28/22 ?2333 03/30/22 ?0434 03/31/22 ?6294  ?WBC 9.5   < > 9.9 9.9  ?NEUTROABS 8.1*  --   --   --   ?HGB 9.2*   < > 8.4* 8.8*  ?HCT 28.6*   < > 25.4* 26.6*  ?MCV 95.7   < > 93.4 91.4  ?PLT 131*   < > 123* 130*  ? < > = values in this interval not displayed.  ? ?Cardiac Enzymes: ?No results for input(s): CKTOTAL, CKMB, CKMBINDEX, TROPONINI in the last 72 hours. ?BNP: ?Invalid input(s): POCBNP ?D-Dimer: ?No results for input(s): DDIMER in the last 72 hours. ?Hemoglobin A1C: ?No results for input(s): HGBA1C in the last 72 hours. ?Fasting Lipid Panel: ?No results for input(s): CHOL, HDL, LDLCALC, TRIG, CHOLHDL, LDLDIRECT in the last 72 hours. ?Thyroid Function Tests: ?No results for input(s): TSH, T4TOTAL, T3FREE, THYROIDAB in the last 72 hours. ? ?Invalid input(s):  FREET3 ?Anemia Panel: ?No results for input(s): VITAMINB12, FOLATE, FERRITIN, TIBC, IRON, RETICCTPCT in the last 72 hours. ? ? ?PHYSICAL EXAM ?General: Well developed, well nourished, in no acute distress ?HEENT:  Normocephalic and atramatic ?Neck:  No JVD.  ?Lungs: Clear bilaterally to auscultation and percussion. ?Heart: HRRR . Normal S1 and S2 without gallops or murmurs.  ?Abdomen: Bowel sounds are positive, abdomen soft and non-tender  ?Msk:  Back normal, normal gait. Normal strength and tone for age. ?Extremities: No clubbing, cyanosis or edema.   ?Neuro: Alert and oriented X 3. ?Psych:  Good affect, responds appropriately ? ?TELEMETRY: NSR, HR 79 bpm ? ?ASSESSMENT AND PLAN:  Patient resting comfortably in bed. Denies chest pain. Shortness of breath and lower extremity edema improved. Echo done 03/29/22 showed EF 25-30%, grade III DD. Patient b/p and HR controlled on carvedilol. CKDIV, unable to start Entresto or Ace/ARB. Continue with IV Lasix. Will add BiDil for better blood pressure control. Will continue to follow.  ? ?Principal Problem: ?  CHF (congestive heart failure) ([place]) ?  ? ?  [employer], FNP-C ?03/31/2022 ?8:57 AM ? ? ? ?    ?

## 2022-04-01 ENCOUNTER — Inpatient Hospital Stay: Payer: Medicare PPO

## 2022-04-01 DIAGNOSIS — N179 Acute kidney failure, unspecified: Secondary | ICD-10-CM

## 2022-04-01 DIAGNOSIS — I5043 Acute on chronic combined systolic (congestive) and diastolic (congestive) heart failure: Secondary | ICD-10-CM | POA: Diagnosis not present

## 2022-04-01 DIAGNOSIS — E440 Moderate protein-calorie malnutrition: Secondary | ICD-10-CM | POA: Insufficient documentation

## 2022-04-01 DIAGNOSIS — D696 Thrombocytopenia, unspecified: Secondary | ICD-10-CM

## 2022-04-01 DIAGNOSIS — J9601 Acute respiratory failure with hypoxia: Secondary | ICD-10-CM

## 2022-04-01 DIAGNOSIS — J189 Pneumonia, unspecified organism: Secondary | ICD-10-CM

## 2022-04-01 LAB — BLOOD GAS, ARTERIAL
Acid-base deficit: 2.6 mmol/L — ABNORMAL HIGH (ref 0.0–2.0)
Bicarbonate: 19.1 mmol/L — ABNORMAL LOW (ref 20.0–28.0)
FIO2: 1 %
O2 Content: 15 L/min
O2 Saturation: 99.2 %
Patient temperature: 37
pCO2 arterial: 25 mmHg — ABNORMAL LOW (ref 32–48)
pH, Arterial: 7.49 — ABNORMAL HIGH (ref 7.35–7.45)
pO2, Arterial: 242 mmHg — ABNORMAL HIGH (ref 83–108)

## 2022-04-01 LAB — CBC WITH DIFFERENTIAL/PLATELET
Abs Immature Granulocytes: 0.06 10*3/uL (ref 0.00–0.07)
Basophils Absolute: 0 10*3/uL (ref 0.0–0.1)
Basophils Relative: 0 %
Eosinophils Absolute: 0.1 10*3/uL (ref 0.0–0.5)
Eosinophils Relative: 1 %
HCT: 22.5 % — ABNORMAL LOW (ref 39.0–52.0)
Hemoglobin: 7.4 g/dL — ABNORMAL LOW (ref 13.0–17.0)
Immature Granulocytes: 1 %
Lymphocytes Relative: 13 %
Lymphs Abs: 1.3 10*3/uL (ref 0.7–4.0)
MCH: 30.1 pg (ref 26.0–34.0)
MCHC: 32.9 g/dL (ref 30.0–36.0)
MCV: 91.5 fL (ref 80.0–100.0)
Monocytes Absolute: 0.6 10*3/uL (ref 0.1–1.0)
Monocytes Relative: 6 %
Neutro Abs: 7.7 10*3/uL (ref 1.7–7.7)
Neutrophils Relative %: 79 %
Platelets: 119 10*3/uL — ABNORMAL LOW (ref 150–400)
RBC: 2.46 MIL/uL — ABNORMAL LOW (ref 4.22–5.81)
RDW: 14.4 % (ref 11.5–15.5)
WBC: 9.8 10*3/uL (ref 4.0–10.5)
nRBC: 0 % (ref 0.0–0.2)

## 2022-04-01 LAB — COMPREHENSIVE METABOLIC PANEL
ALT: 76 U/L — ABNORMAL HIGH (ref 0–44)
AST: 52 U/L — ABNORMAL HIGH (ref 15–41)
Albumin: 1.9 g/dL — ABNORMAL LOW (ref 3.5–5.0)
Alkaline Phosphatase: 126 U/L (ref 38–126)
Anion gap: 11 (ref 5–15)
BUN: 97 mg/dL — ABNORMAL HIGH (ref 8–23)
CO2: 22 mmol/L (ref 22–32)
Calcium: 7.6 mg/dL — ABNORMAL LOW (ref 8.9–10.3)
Chloride: 103 mmol/L (ref 98–111)
Creatinine, Ser: 5.25 mg/dL — ABNORMAL HIGH (ref 0.61–1.24)
GFR, Estimated: 10 mL/min — ABNORMAL LOW (ref >60)
Glucose, Bld: 91 mg/dL (ref 70–99)
Potassium: 3.9 mmol/L (ref 3.5–5.1)
Sodium: 136 mmol/L (ref 135–145)
Total Bilirubin: 0.7 mg/dL (ref 0.3–1.2)
Total Protein: 5 g/dL — ABNORMAL LOW (ref 6.5–8.1)

## 2022-04-01 LAB — PROCALCITONIN: Procalcitonin: 1.07 ng/mL

## 2022-04-01 LAB — PHOSPHORUS: Phosphorus: 4.9 mg/dL — ABNORMAL HIGH (ref 2.5–4.6)

## 2022-04-01 LAB — GLUCOSE, CAPILLARY: Glucose-Capillary: 134 mg/dL — ABNORMAL HIGH (ref 70–99)

## 2022-04-01 LAB — TROPONIN I (HIGH SENSITIVITY)
Troponin I (High Sensitivity): 92 ng/L — ABNORMAL HIGH (ref <18)
Troponin I (High Sensitivity): 100 ng/L (ref <18)

## 2022-04-01 LAB — MAGNESIUM: Magnesium: 1.7 mg/dL (ref 1.7–2.4)

## 2022-04-01 MED ORDER — HEPARIN (PORCINE) 25000 UT/250ML-% IV SOLN
700.0000 [IU]/h | INTRAVENOUS | Status: DC
  Administered 2022-04-02 – 2022-04-03 (×2): 700 [IU]/h via INTRAVENOUS
  Filled 2022-04-01 (×2): qty 250

## 2022-04-01 NOTE — Progress Notes (Signed)
PT Cancellation Note ? ?Patient Details ?Name: Glenn Jarvis. ?MRN: [id number] ?DOB: 1927/09/08 ? ? ?Cancelled Treatment:    Reason Eval/Treat Not Completed: Other (comment). Pt sleeping upon arrival, however easily awakens. Pt reports he is too fatigued to ambulate today. Offered to perform modified session, however pt declined and requested another day. Will re-attempt another day. ? ? ?Channing Savich ?04/01/2022, 3:05 PM ?Greggory Stallion, PT, DPT, GCS ?[phone] ? ?

## 2022-04-01 NOTE — Progress Notes (Signed)
SUBJECTIVE: Glenn Jarvis. is a 88 y.o. male with medical history significant of hypothyroidism, Hx of SVT, Uncontrolled HTN, CKDIV, Gout, who has interim history of diagnosis of CAP after presenting to pcp 3/30 for respiratory symptoms.  ?  ?Denies chest pain. Shortness of breath and lower extremity edema improved ? ? ?Vitals:  ? 04/01/22 0600 04/01/22 0607 04/01/22 0715 04/01/22 0720  ?BP:  109/76  118/77  ?Pulse:  96  97  ?Resp:  20    ?Temp:  98.2 ?F (36.8 ?C)  97.6 ?F (36.4 ?C)  ?TempSrc:      ?SpO2:  90% 95% 98%  ?Weight: 59 kg     ?Height:      ? ? ?Intake/Output Summary (Last 24 hours) at 04/01/2022 0856 ?Last data filed at 04/01/2022 0600 ?Gross per 24 hour  ?Intake 434.62 ml  ?Output 1950 ml  ?Net -1515.38 ml  ? ? ?LABS: ?Basic Metabolic Panel: ?Recent Labs  ?  03/30/22 ?0434 03/31/22 ?8366 04/01/22 ?0441  ?NA 137 135 136  ?K 4.8 4.2 3.9  ?CL 105 103 103  ?CO2 22 20* 22  ?GLUCOSE 102* 96 91  ?BUN 80* 95* 97*  ?CREATININE 4.42* 4.66* 5.25*  ?CALCIUM 7.8* 7.7* 7.6*  ?MG 1.8  --  1.7  ?PHOS 5.2*  --  4.9*  ? ?Liver Function Tests: ?Recent Labs  ?  04/01/22 ?0441  ?AST 52*  ?ALT 76*  ?ALKPHOS 126  ?BILITOT 0.7  ?PROT 5.0*  ?ALBUMIN 1.9*  ? ?No results for input(s): LIPASE, AMYLASE in the last 72 hours. ?CBC: ?Recent Labs  ?  03/31/22 ?2947 04/01/22 ?0441  ?WBC 9.9 9.8  ?NEUTROABS  --  7.7  ?HGB 8.8* 7.4*  ?HCT 26.6* 22.5*  ?MCV 91.4 91.5  ?PLT 130* 119*  ? ?Cardiac Enzymes: ?No results for input(s): CKTOTAL, CKMB, CKMBINDEX, TROPONINI in the last 72 hours. ?BNP: ?Invalid input(s): POCBNP ?D-Dimer: ?No results for input(s): DDIMER in the last 72 hours. ?Hemoglobin A1C: ?No results for input(s): HGBA1C in the last 72 hours. ?Fasting Lipid Panel: ?No results for input(s): CHOL, HDL, LDLCALC, TRIG, CHOLHDL, LDLDIRECT in the last 72 hours. ?Thyroid Function Tests: ?No results for input(s): TSH, T4TOTAL, T3FREE, THYROIDAB in the last 72 hours. ? ?Invalid input(s): FREET3 ?Anemia Panel: ?No results for input(s):  VITAMINB12, FOLATE, FERRITIN, TIBC, IRON, RETICCTPCT in the last 72 hours. ? ? ?PHYSICAL EXAM ?General: Well developed, well nourished, in no acute distress ?HEENT:  Normocephalic and atramatic ?Neck:  No JVD.  ?Lungs: Clear bilaterally to auscultation and percussion. ?Heart: HRRR . Normal S1 and S2 without gallops or murmurs.  ?Abdomen: Bowel sounds are positive, abdomen soft and non-tender  ?Msk:  Back normal, normal gait. Normal strength and tone for age. ?Extremities: No clubbing, cyanosis or edema.   ?Neuro: Alert and oriented X 3. ?Psych:  Good affect, responds appropriately ? ?TELEMETRY: sinus tachycardia, HR 104 bpm ? ?ASSESSMENT AND PLAN: Patient resting comfortably in bed, eating his breakfast. Denies chest pain. Shortness of breath and lower extremity edema improved. Echo done 03/29/22 showed EF 25-30%, grade III DD. Patient b/p and HR controlled on carvedilol. CKDIV, unable to start Entresto or Ace/ARB. Continue with IV Lasix. BiDil added for better blood pressure control. Will continue to follow.  ? ?Principal Problem: ?  CHF (congestive heart failure) ([place]) ?  ? ?[employer], FNP-C ?04/01/2022 ?8:56 AM ? ? ? ?    ?

## 2022-04-01 NOTE — Assessment & Plan Note (Signed)
Platelets low stable ?

## 2022-04-01 NOTE — Progress Notes (Signed)
?  Progress Note ? ? ?Patient: Glenn Strollo Sr. XBD:[id number] DOB: [birth year] DOA: 03/28/2022     4 ?DOS: the patient was seen and examined on 04/01/2022 ?  ?Brief hospital course: ?[name] is a 88 y.o. M with CKD IV, HTN, hypothyroidism who prsented with 1 month swelling, and then 2 days severe dyspnea. ? ?Went to PCP, had CXR that showed bilateral opacities, was evidently prescribed furosemide and doxycycline, got worse and came to the ER. ? ?In the ER, required 4-6L to keep sats normal, was dyspneic.  CXR showed bilateral opacities.  BNP elevated. ? ?Admitted and started on IV Lasix.  Cr steadily worsened from 3 >> 5, and Nephrology were consulted. ? ?Assessment and Plan: ?* Acute on chronic combined systolic and diastolic CHF (congestive heart failure) ([place]) ?Cr up to >5 today ?- Hold diuretics ?- Strict I/Os ?- Daily Cr ? ?-Continue Bidil and Coreg and aspirin ? ? ? ? ?Acute renal failure superimposed on stage 4 chronic kidney disease ([place]) ?Cr worsening.  Renal US chronic disease, no hydronephrosis.   ?- Hold diuretics ?- Consult Nephrology ?- Trend Cr ? ?Multifocal pneumonia ?-Continue Zosyn and doxycycline ? ?Thrombocytopenia ([place]) ?Platelets low stable ? ?Acute respiratory failure with hypoxia ([place]) ?Due to CHF and possibly pneumonia.  Not responding to treatment of either.   ? ?Malnutrition of moderate degree ?  ? ?Anemia in chronic kidney disease (CKD) ?Hgb dropping down.  Poor prognostic sign.  Given hypoxia, I do not think transfusion is a simple decision. ? ?Adult hypothyroidism ?TSH elevated ?-Continue levothyroxine ? ?Benign hypertension ?BP controlled ?-Continue Coreg, Bidil ? ? ? ? ?  ? ?Subjective: Patient with dyspnea, fatigue, no more swelling.  No fever, confusion.  He is very sleepy. ? ? ? ?Physical Exam: ?Vitals:  ? 04/01/22 0720 04/01/22 1130 04/01/22 1152 04/01/22 1257  ?BP: 118/77   99/66  ?Pulse: 97   (!) 54  ?Resp:    18  ?Temp: 97.6 ?F (36.4 ?C)   (!) 97.5 ?F (36.4 ?C)  ?TempSrc:       ?SpO2: 98% (!) 87% 91% 100%  ?Weight:      ?Height:      ? ?Cachectic elderly male, lying in bed, appears somnolent, but makes eye contact ?Is oriented to person and situation. ?Tachycardic, regular, no peripheral edema, JVP normal ?Lung sounds with rales in periphery, no wheezing ?Abdomen soft no tenderness palpation or guarding ?Attention diminished, affect blunted, judgment insight appear mildly impaired but he is oriented to person, place, time, he has severe generalized weakness. ? ? ?Data Reviewed: ?Discussed with nephrology.  Nursing notes reviewed, vital signs reviewed. ?Labs are notable for hemoglobin down to 7.4, procalcitonin slightly elevated, platelets stable at 112, BNP 1200, creatinine up to 5.2, LFTs slightly elevated ? ? ? ? ?Family Communication: Daughter by phone, son at the bedside, grandson in Sports coach by phone. ? ?Disposition: ?Status is: Inpatient ? ? Planned Discharge Destination: To be determined ? ? ? ? ?Author: ?Edwin Dada, MD ?04/01/2022 5:07 PM ? ?For on call review [email or web address].  ?

## 2022-04-01 NOTE — Progress Notes (Signed)
?   04/01/22 1600  ?Clinical Encounter Type  ?Visited With Patient and family together  ?Visit Type Initial;Spiritual support  ?Spiritual Encounters  ?Spiritual Needs Prayer  ? ?Chaplain responded to Spiritual Consult requesting prayer ?

## 2022-04-01 NOTE — Assessment & Plan Note (Addendum)
Completed 7 days antibiotics without improvement.  I do not think this is helping. ?- Stop antibiotics ?

## 2022-04-01 NOTE — Consult Note (Signed)
? ?  Heart Failure Nurse Navigator Note ? ?HFrEF 25 to 30%.  Grade 3 diastolic dysfunction. ? ?He presented to the emergency room after he was sent by his PCP for community-acquired pneumonia and abnormal lab work. ? ?Comorbidities: ? ?Hypertension ?Chronic kidney disease stage II ?Gout ?Arthritis ?GERD ?Hyperlipidemia ?SVT ? ?Labs: ? ?Sodium 136, potassium 3.9, chloride 103, CO2 22, BUN 97, creatinine 5.25 up from 4.66 of yesterday ?Albumin 1.9,AST 52,ALT 76,Estimated GFR 10, phosphorus 4.9, magnesium 1.7. ?Weight is 59 kg ?Intake 434 mL ?Output 1950 mL ? ?Medications: ? ?Aspirin 81 mg daily ?Coreg 6.25 mg 2 times a day with meals ?Isosorbide/hydralazine 20/37.51 tablet 3 times a day ? ?Lasix 60 mg IV every 12 hours has been discontinued ? ? ?Shall meeting with patient and son , Joneen Boers, who was at the bedside.  Nods off from time to time. ? ?Son states that he lives with his daughter, "Muffin".  Asked if she is the one that prepares the meals and the son stepped in and asked that I not asked that,says that is a sore subject.  He asked that I move onto something else. ? ?Discussed daily weights, not using salt at the table and fluid restriction. ? ?Son states that they are hoping that he returns to discharged home with help from home health. ? ?He was given the living with heart failure teaching booklet, zone magnet and information about the outpatient heart failure clinic. ? ?He has an appointment on April 11 at 3:30 in the afternoon.  Is a 25% no-show ratio of 18 out of 71 appointments. ? ?Pricilla Riffle RN [facility] ? ? ?

## 2022-04-01 NOTE — Assessment & Plan Note (Addendum)
Hgb low due to renal failure, CHF.  Stable today. ?

## 2022-04-01 NOTE — Assessment & Plan Note (Addendum)
Cr continues to gradually worsen.  BUN >100, maybe some mild symptomatic uremia.    ?

## 2022-04-01 NOTE — Hospital Course (Addendum)
Glenn Jarvis is a 88 y.o. M with CKD IV, HTN, hypothyroidism who prsented with 1 month swelling, and then 2 days severe dyspnea. ? ?Went to PCP, had CXR that showed bilateral opacities, was evidently prescribed furosemide and doxycycline, got worse and came to the ER. ? ?In the ER, required 4-6L to keep sats normal, was dyspneic.  CXR showed bilateral opacities.  BNP elevated. ? ?Admitted and started on IV Lasix.  Cr steadily worsened from 3 >> 5, and Nephrology were consulted. ? ?Patient failed to respond to antibiotics and diuresis and remained hypoxic with chest congestion despite aggressive therapy.  Given his progressive renal dysfunction and uremia and hypoxia and failure to respond to therapy, decision was made to refer to hospice for end of life care.  Reviewed by Hospice, will plan to transition to Hospice house when bed available. ?

## 2022-04-01 NOTE — Assessment & Plan Note (Addendum)
-   Okay to continue Lasix to decrease symptomatic congestions ?- Okay to continue Bidil, coreg, with hold parameters and aspirin  ?

## 2022-04-01 NOTE — Assessment & Plan Note (Signed)
BP controlled ?-Continue Coreg, Bidil ?

## 2022-04-01 NOTE — Assessment & Plan Note (Signed)
TSH elevated ?-Continue levothyroxine ?

## 2022-04-01 NOTE — Progress Notes (Signed)
? ?      CROSS COVER NOTE ? ?NAME: Glenn Esguerra Sr. ?MRN: [id number] ?DOB : [birth year] ? ? ? ?Date of Service ?  04/01/2022  ?HPI/Events of Note ?  Sign out received from Dr Loleta Books regarding patient's change in status this evening. EKG at that time showed non-specific ST and T wave abnormalities. Troponin pending at time of sign out.  ? ?Troponin 92-->100-->113 ?Delta +8-->+21  ?Interventions ?  Plan: ?Heparin per pharmacy  ?Trend Troponin ? ?   ?  ? ?Neomia Glass MHA, MSN, FNP-BC ?Nurse Practitioner ?[facility] ?[place] ?Pager [phone] ? ?

## 2022-04-01 NOTE — Progress Notes (Signed)
?[facility]  ?ROUNDING NOTE  ? ?Subjective:  ? ?Patient sitting up in bed ?Continues to complain of poor oral intake ?Denies nausea and vomiting ?No lower extremity edema ? ?Creatinine 5.25 ?UOP 1.95L in 24 hours ? ?Objective:  ?Vital signs in last 24 hours:  ?Temp:  [97.5 ?F (36.4 ?C)-98.2 ?F (36.8 ?C)] 97.5 ?F (36.4 ?C) (04/04 1257) ?Pulse Rate:  [54-99] 54 (04/04 1257) ?Resp:  [18-20] 18 (04/04 1257) ?BP: (91-118)/(64-77) 99/66 (04/04 1257) ?SpO2:  [87 %-100 %] 100 % (04/04 1257) ?Weight:  [59 kg] 59 kg (04/04 0600) ? ?Weight change: -3.5 kg ?Filed Weights  ? 03/30/22 0423 03/31/22 0415 04/01/22 0600  ?Weight: 59.9 kg 62.5 kg 59 kg  ? ? ?Intake/Output: ?I/O last 3 completed shifts: ?In: 844.6 [P.O.:570; I.V.:74.6; IV Piggyback:200] ?Out: 2950 [Urine:2950] ?  ?Intake/Output this shift: ? Total I/O ?In: 39.1 [I.V.:39.1] ?Out: -  ? ?Physical Exam: ?General: NAD  ?Head: Normocephalic, atraumatic. Moist oral mucosal membranes  ?Eyes: Anicteric  ?Lungs:  Clear to auscultation, normal effort  ?Heart: Regular rate and rhythm  ?Abdomen:  Soft, nontender  ?Extremities:  2+ peripheral edema.  ?Neurologic: Nonfocal, moving all four extremities  ?Skin: No lesions  ?Access: None  ? ? ?Basic Metabolic Panel: ?Recent Labs  ?Lab 03/28/22 ?1330 03/28/22 ?2333 03/29/22 ?0422 03/30/22 ?0434 03/31/22 ?1610 04/01/22 ?0441  ?NA 136  --  139 137 135 136  ?K 4.9  --  5.4* 4.8 4.2 3.9  ?CL 104  --  107 105 103 103  ?CO2 20*  --  22 22 20* 22  ?GLUCOSE 106*  --  86 102* 96 91  ?BUN 54*  --  64* 80* 95* 97*  ?CREATININE 3.92* 3.88* 3.98* 4.42* 4.66* 5.25*  ?CALCIUM 8.5*  --  8.5* 7.8* 7.7* 7.6*  ?MG  --   --   --  1.8  --  1.7  ?PHOS  --   --   --  5.2*  --  4.9*  ? ? ?Liver Function Tests: ?Recent Labs  ?Lab 03/27/22 ?1315 03/28/22 ?1330 04/01/22 ?0441  ?AST 21 229* 52*  ?ALT 9 152* 76*  ?ALKPHOS 150* 222* 126  ?BILITOT 0.6 1.1 0.7  ?PROT 6.6 6.8 5.0*  ?ALBUMIN 3.4* 2.7* 1.9*  ? ?Recent Labs  ?Lab 03/28/22 ?2333  ?LIPASE 21   ? ?No results for input(s): AMMONIA in the last 168 hours. ? ?CBC: ?Recent Labs  ?Lab 03/27/22 ?1315 03/28/22 ?1330 03/28/22 ?2333 03/30/22 ?0434 03/31/22 ?9604 04/01/22 ?0441  ?WBC 9.2 9.5 10.4 9.9 9.9 9.8  ?NEUTROABS 7.7* 8.1*  --   --   --  7.7  ?HGB 9.7* 9.2* 9.9* 8.4* 8.8* 7.4*  ?HCT 29.5* 28.6* 30.4* 25.4* 26.6* 22.5*  ?MCV 93 95.7 93.3 93.4 91.4 91.5  ?PLT 126* 131* 135* 123* 130* 119*  ? ? ?Cardiac Enzymes: ?No results for input(s): CKTOTAL, CKMB, CKMBINDEX, TROPONINI in the last 168 hours. ? ?BNP: ?Invalid input(s): POCBNP ? ?CBG: ?No results for input(s): GLUCAP in the last 168 hours. ? ?Microbiology: ?Results for orders placed or performed during the hospital encounter of 03/28/22  ?Culture, blood (routine x 2)     Status: None (Preliminary result)  ? Collection Time: 03/28/22  1:30 PM  ? Specimen: BLOOD  ?Result Value Ref Range Status  ? Specimen Description BLOOD BLOOD LEFT ARM UPPER  Final  ? Special Requests   Final  ?  BOTTLES DRAWN AEROBIC AND ANAEROBIC Blood Culture adequate volume  ? Culture   Final  ?  NO GROWTH 4 DAYS ?Performed at [facility], [address], [place], [place] [address] ?  ? Report Status PENDING  Incomplete  ?Respiratory (~20 pathogens) panel by PCR     Status: None  ? Collection Time: 03/28/22  1:31 PM  ? Specimen: Nasopharyngeal Swab; Respiratory  ?Result Value Ref Range Status  ? Adenovirus NOT DETECTED NOT DETECTED Final  ? Coronavirus 229E NOT DETECTED NOT DETECTED Final  ?  Comment: (NOTE) ?The Coronavirus on the Respiratory Panel, DOES NOT test for the novel  ?Coronavirus (2019 nCoV) ?  ? Coronavirus HKU1 NOT DETECTED NOT DETECTED Final  ? Coronavirus NL63 NOT DETECTED NOT DETECTED Final  ? Coronavirus OC43 NOT DETECTED NOT DETECTED Final  ? Metapneumovirus NOT DETECTED NOT DETECTED Final  ? Rhinovirus / Enterovirus NOT DETECTED NOT DETECTED Final  ? Influenza A NOT DETECTED NOT DETECTED Final  ? Influenza B NOT DETECTED NOT DETECTED Final  ? Parainfluenza  Virus 1 NOT DETECTED NOT DETECTED Final  ? Parainfluenza Virus 2 NOT DETECTED NOT DETECTED Final  ? Parainfluenza Virus 3 NOT DETECTED NOT DETECTED Final  ? Parainfluenza Virus 4 NOT DETECTED NOT DETECTED Final  ? Respiratory Syncytial Virus NOT DETECTED NOT DETECTED Final  ? Bordetella pertussis NOT DETECTED NOT DETECTED Final  ? Bordetella Parapertussis NOT DETECTED NOT DETECTED Final  ? Chlamydophila pneumoniae NOT DETECTED NOT DETECTED Final  ? Mycoplasma pneumoniae NOT DETECTED NOT DETECTED Final  ?  Comment: Performed at [facility], [place] [address] [place],  [address]  ?Resp Panel by RT-PCR (Flu A&B, Covid) Nasopharyngeal Swab     Status: None  ? Collection Time: 03/28/22  2:12 PM  ? Specimen: Nasopharyngeal Swab; Nasopharyngeal(NP) swabs in vial transport medium  ?Result Value Ref Range Status  ? SARS Coronavirus 2 by RT PCR NEGATIVE NEGATIVE Final  ?  Comment: (NOTE) ?SARS-CoV-2 target nucleic acids are NOT DETECTED. ? ?The SARS-CoV-2 RNA is generally detectable in upper respiratory ?specimens during the acute phase of infection. The lowest ?concentration of SARS-CoV-2 viral copies this assay can detect is ?138 copies/mL. A negative result does not preclude SARS-Cov-2 ?infection and should not be used as the sole basis for treatment or ?other patient management decisions. A negative result may occur with  ?improper specimen collection/handling, submission of specimen other ?than nasopharyngeal swab, presence of viral mutation(s) within the ?areas targeted by this assay, and inadequate number of viral ?copies(<138 copies/mL). A negative result must be combined with ?clinical observations, patient history, and epidemiological ?information. The expected result is Negative. ? ?Fact Sheet for Patients:  ?EntrepreneurPulse.com.au ? ?Fact Sheet for Healthcare Providers:  ?IncredibleEmployment.be ? ?This test is no t yet approved or cleared by the [place] FDA  and  ?has been authorized for detection and/or diagnosis of SARS-CoV-2 by ?FDA under an Emergency Use Authorization (EUA). This EUA will remain  ?in effect (meaning this test can be used) for the duration of the ?COVID-19 declaration under Section 564(b)(1) of the Act, 21 ?U.S.C.section 360bbb-3(b)(1), unless the authorization is terminated  ?or revoked sooner.  ? ? ?  ? Influenza A by PCR NEGATIVE NEGATIVE Final  ? Influenza B by PCR NEGATIVE NEGATIVE Final  ?  Comment: (NOTE) ?The Xpert Xpress SARS-CoV-2/FLU/RSV plus assay is intended as an aid ?in the diagnosis of influenza from Nasopharyngeal swab specimens and ?should not be used as a sole basis for treatment. Nasal washings and ?aspirates are unacceptable for Xpert Xpress SARS-CoV-2/FLU/RSV ?testing. ? ?Fact Sheet for Patients: ?EntrepreneurPulse.com.au ? ?  Fact Sheet for Healthcare Providers: ?IncredibleEmployment.be ? ?This test is not yet approved or cleared by the [place] FDA and ?has been authorized for detection and/or diagnosis of SARS-CoV-2 by ?FDA under an Emergency Use Authorization (EUA). This EUA will remain ?in effect (meaning this test can be used) for the duration of the ?COVID-19 declaration under Section 564(b)(1) of the Act, 21 U.S.C. ?section 360bbb-3(b)(1), unless the authorization is terminated or ?revoked. ? ?Performed at [facility], [place], ?[place] [address] ?  ?Culture, blood (routine x 2)     Status: None (Preliminary result)  ? Collection Time: 03/28/22  2:26 PM  ? Specimen: BLOOD LEFT ARM  ?Result Value Ref Range Status  ? Specimen Description BLOOD LEFT ARM  Final  ? Special Requests   Final  ?  BOTTLES DRAWN AEROBIC AND ANAEROBIC Blood Culture adequate volume  ? Culture   Final  ?  NO GROWTH 4 DAYS ?Performed at [facility], [address] [place],  [address] ?  ? Report Status PENDING  Incomplete  ? ? ?Coagulation Studies: ?No results for  input(s): LABPROT, INR in the last 72 hours. ? ?Urinalysis: ?No results for input(s): COLORURINE, LABSPEC, PHURINE, GLUCOSEU, HGBUR, BILIRUBINUR, KETONESUR, PROTEINUR, UROBILINOGEN, NITRITE, LEUKOCYTESUR in t

## 2022-04-01 NOTE — Progress Notes (Signed)
Patient alert and oriented at 1700 entered room at 1800 O2 80% patient very lethargic. Non-rebreather applied. Rapid response called. MD notified. ?Era Bumpers, RN  ?

## 2022-04-01 NOTE — Assessment & Plan Note (Addendum)
Stable today.

## 2022-04-01 NOTE — Progress Notes (Signed)
Rapid Response Event Note  ? ?Reason for Call : lethargic, shob, desat ? ? ?Initial Focused Assessment: On my arrival pt is lying in bed with eyes closed on 100% nonrebreather. Pt is difficult to arouse but will open eyes and can state his name. Pt is very lethargic. Primary RN states that pt was alert and oriented about 1 hr prior. Pt's O2 sats previously in the 80s on [facility] but are now 100% on nonrebreather. Pt's BP is soft but MAP maintains greater the 65. CBG WNL. Primary RN states that pt had taken Tylenol and coreg at 1700. Crackles are present when auscultating lungs. Family member is present at bedside. ? ? ?Interventions: ABG, chest xray, and 12 lead EKG ordered per rapid order set and performed. Dr. Loleta Books notified of rapid response called. Dr. Loleta Books called and spoke with pt's daughter who is on her way to the hospital. Once daughter arrived, Dr. Loleta Books called back and spoke to daughter while she was at bedside. Pt remains lethargic, on nonrebreather, SBP in the 80s but MAP 73. ? ? ?Plan of Care: After Dr. Loleta Books spoke with daughter, pt was made a DNR. Daughter is okay with pt going on bipap if needed. After speaking with MD and 2A charge RN, pt will remain on 2A at this time. Will monitor need for transfer if oxygen need or BP declines.  ? ? ? ?Event Summary:  ? ?MD Notified: Dr. Loleta Books ?Call Time: 1816 ?Arrival [id number] ?End Time:1850 ? ?Trellis Paganini, RN ?

## 2022-04-01 NOTE — Progress Notes (Signed)
ANTICOAGULATION CONSULT NOTE ? ?Pharmacy Consult for heparin infusion ?Indication: ACS/STEMI ? ?Allergies  ?Allergen Reactions  ? Codeine   ? Cortizone-10  [Hydrocortisone]   ? ? ?Patient Measurements: ?Height: '6\' 1"'$  (185.4 cm) ?Weight: 59 kg (130 lb 1.1 oz) ?IBW/kg (Calculated) : 79.9 ?Heparin Dosing Weight: 59 kg ? ?Vital Signs: ?Temp: 97.4 ?F (36.3 ?C) (04/04 2230) ?Temp Source: Oral (04/04 2142) ?BP: 129/77 (04/04 2230) ?Pulse Rate: 51 (04/04 2230) ? ?Labs: ?Recent Labs  ?  03/30/22 ?0434 03/31/22 ?4944 04/01/22 ?0441 04/01/22 ?1953 04/01/22 ?2138  ?HGB 8.4* 8.8* 7.4*  --   --   ?HCT 25.4* 26.6* 22.5*  --   --   ?PLT 123* 130* 119*  --   --   ?CREATININE 4.42* 4.66* 5.25*  --   --   ?TROPONINIHS  --   --   --  92* 100*  ? ? ?Estimated Creatinine Clearance: 7.3 mL/min (A) (by C-G formula based on SCr of 5.25 mg/dL (H)). ? ? ?Medical History: ?Past Medical History:  ?Diagnosis Date  ? Arthritis   ? GERD (gastroesophageal reflux disease)   ? Hemorrhoid 1986  ? Hyperlipidemia   ? Hypertension   ? SVT (supraventricular tachycardia) ([place])   ? Thyroid disease   ? Vertigo   ? ? ?Medications:  ?Heparin 5000 units q8h, last dose 4/4 @ 1710 ? ?Assessment: ?Pt Is 88 yo male admitted 3/31 begin transitioned to heparin drip after recent labs showing slightly elevated troponin I, trending up. ? ?Goal of Therapy:  ?Heparin level 0.3-0.7 units/ml ?Monitor platelets by anticoagulation protocol: Yes ?  ?Plan:  ?No initial bolus ?Start heparin infusion at 700 units/hr ?Will check HL in 8 hours after start ?Due to"baseline" aPTT > 200, recheck aPTT w/ HL   ?CBC daily while on heparin ? ?Renda Rolls, PharmD, MBA ?04/01/2022 ?11:18 PM ? ? ? ?

## 2022-04-01 NOTE — Progress Notes (Addendum)
Pt on continuous BIPAP and is on scheduled doxycycline 100 mg tablet and BIDIL tablet. NP Foust made aware. Will continue to monitor. ? ?Update 2247: Per NP Foust hold doxycyline and BIDIL at this time. Lab also called for a critical troponin of 100. NP Foust made aware. Will continue to monitor. ? ?Update 0019: Pt refused BIPAP at this time. Respiratory made aware and palced pt on HFNC at 8 liters at this time. NP Foust made aware. Will continue to monitor. ? ?Update 0326: Lab called for a critical APTT of >200 and heparin unfractioned at 0.15. Pharmacy made aware and states to continue heparin at this time. Will continue to monitor. ?

## 2022-04-01 NOTE — Progress Notes (Signed)
Patient with worsening hypoxia and worsening mentation late this afternoon. ? ?ABG obtained stat and showed pH elevated, pCO2 25, pO2 elevated (but on NRB).  CXR obtained and with dense bilateral opacities, maybe worse, but certainly no better from prior, despite antibiotics and diuretics.  ECG with new subtle lateral ST depressions and TW inversions.  ? ? ?My suspicion is that he has progressive heart failure, not plaque rupture, but we will obtain troponins and have a low threshold to start heparin. ? ?Anemia is certainly worsening his respiratory status and heart failure and anemia, although it is ambiguous whether transfusion wuold help or harm. ? ?Discused with POA.  BiPAP and transfer to stepdown would be within his goals of care, but not intubation or CPR.  DNR changed.  I suspect heparin if needed would be reasonable, and transfusion (despite risks) would be acceptable.  Family understand prognosis is grave and uncertain. ?

## 2022-04-01 NOTE — Progress Notes (Signed)
Initial Nutrition Assessment ? ?DOCUMENTATION CODES:  ? ?Underweight, Non-severe (moderate) malnutrition in context of chronic illness ? ?INTERVENTION:  ?- Encourage adequate PO intake ?- Strawberry ice cream added to all meal trays ?- MVI with minerals daily ? ?NUTRITION DIAGNOSIS:  ? ?Moderate Malnutrition related to chronic illness (HTN, CKDII, gout, advanced age) as evidenced by moderate fat depletion, severe muscle depletion. ? ?GOAL:  ? ?Patient will meet greater than or equal to 90% of their needs ? ?MONITOR:  ? ?PO intake, Supplement acceptance, Labs, Weight trends ? ?REASON FOR ASSESSMENT:  ? ?Malnutrition Screening Tool ?  ? ?ASSESSMENT:  ? ?Pt with new onset CHF, referred to ED by PCP for continued SOB and abnormal labs. Pt recently diagnosed dx of CAP. PMH significant for hypothyroidism, SVT, uncontrolled HTN, CKDII, and gout. ? ?Pt provided limited nutrition and weight history. He reports having a poor appetite and poor PO intake. Pt denies current nausea and vomiting. Also denies difficulty swallowing. Prefers soft foods as he does not have his dentures. Confirmed in [facility], pt is receiving easy to chew foods. He does not like Ensure and states that they upset his stomach. He is agreeable to strawberry ice cream added to his trays. ? ?Meal completions: ?04/02: 0%-lunch, 25%-dinner ?04/03: 100%-breakfast, 0%-lunch ? ?Pt reports having good mobility PTA. Reviewed weight history. Weight appears to have been increasing within the last 3 months. Current admit weight, noted to be bed scale weight of 59 kg. Will continue to monitor throughout admission. ? ?Medications: doxycycline, IV abx ? ?Labs: BUN 97, Cr 5.25, phos 4.9, AST 52, ALT 76 ? ?NUTRITION - FOCUSED PHYSICAL EXAM: ? ?Flowsheet Row Most Recent Value  ?Orbital Region Moderate depletion  ?Upper Arm Region Severe depletion  ?Thoracic and Lumbar Region Moderate depletion  ?Buccal Region Moderate depletion  ?Temple Region Mild depletion  ?Clavicle  Bone Region Severe depletion  ?Clavicle and Acromion Bone Region Severe depletion  ?Scapular Bone Region Moderate depletion  ?Dorsal Hand Severe depletion  ?Patellar Region Severe depletion  ?Anterior Thigh Region Severe depletion  ?Posterior Calf Region Moderate depletion  ?Edema (RD Assessment) None  ?Hair Reviewed  ?Eyes Reviewed  ?Mouth Other (Comment)  [poor dentition]  ?Skin Reviewed  ?Nails Reviewed  ? ?  ? ?Diet Order:   ?Diet Order   ? ?       ?  Diet regular Room service appropriate? Yes; Fluid consistency: Thin  Diet effective now       ?  ? ?  ?  ? ?  ? ?EDUCATION NEEDS:  ? ?No education needs have been identified at this time ? ?Skin:  Skin Assessment: Reviewed RN Assessment ? ?Last BM:  PTA ? ?Height:  ? ?Ht Readings from Last 1 Encounters:  ?03/29/22 '6\' 1"'$  (1.854 m)  ? ? ?Weight:  ? ?Wt Readings from Last 1 Encounters:  ?04/01/22 59 kg  ? ?BMI:  Body mass index is 17.16 kg/m?. ? ?Estimated Nutritional Needs:  ? ?Kcal:  1700-1900 ? ?Protein:  85-100g ? ?Fluid:  >/=1.7L ? ?Clayborne Dana, RDN, LDN ?Clinical Nutrition ?

## 2022-04-02 DIAGNOSIS — I5043 Acute on chronic combined systolic (congestive) and diastolic (congestive) heart failure: Secondary | ICD-10-CM | POA: Diagnosis not present

## 2022-04-02 DIAGNOSIS — I214 Non-ST elevation (NSTEMI) myocardial infarction: Secondary | ICD-10-CM

## 2022-04-02 LAB — APTT
aPTT: >200 seconds (ref 24–36)
aPTT: >200 seconds (ref 24–36)

## 2022-04-02 LAB — BASIC METABOLIC PANEL
Anion gap: 13 (ref 5–15)
BUN: 112 mg/dL — ABNORMAL HIGH (ref 8–23)
CO2: 21 mmol/L — ABNORMAL LOW (ref 22–32)
Calcium: 7.7 mg/dL — ABNORMAL LOW (ref 8.9–10.3)
Chloride: 103 mmol/L (ref 98–111)
Creatinine, Ser: 5.52 mg/dL — ABNORMAL HIGH (ref 0.61–1.24)
GFR, Estimated: 9 mL/min — ABNORMAL LOW (ref >60)
Glucose, Bld: 98 mg/dL (ref 70–99)
Potassium: 4 mmol/L (ref 3.5–5.1)
Sodium: 137 mmol/L (ref 135–145)

## 2022-04-02 LAB — HEPARIN LEVEL (UNFRACTIONATED)
Heparin Unfractionated: 0.15 IU/mL — ABNORMAL LOW (ref 0.30–0.70)
Heparin Unfractionated: 0.35 IU/mL (ref 0.30–0.70)
Heparin Unfractionated: 0.35 IU/mL (ref 0.30–0.70)

## 2022-04-02 LAB — CBC
HCT: 22.4 % — ABNORMAL LOW (ref 39.0–52.0)
Hemoglobin: 7.4 g/dL — ABNORMAL LOW (ref 13.0–17.0)
MCH: 30.1 pg (ref 26.0–34.0)
MCHC: 33 g/dL (ref 30.0–36.0)
MCV: 91.1 fL (ref 80.0–100.0)
Platelets: 116 10*3/uL — ABNORMAL LOW (ref 150–400)
RBC: 2.46 MIL/uL — ABNORMAL LOW (ref 4.22–5.81)
RDW: 14.4 % (ref 11.5–15.5)
WBC: 9.1 10*3/uL (ref 4.0–10.5)
nRBC: 0 % (ref 0.0–0.2)

## 2022-04-02 LAB — CULTURE, BLOOD (ROUTINE X 2)
Culture: NO GROWTH
Culture: NO GROWTH
Special Requests: ADEQUATE
Special Requests: ADEQUATE

## 2022-04-02 LAB — PROTIME-INR
INR: 1.5 — ABNORMAL HIGH (ref 0.8–1.2)
Prothrombin Time: 18 seconds — ABNORMAL HIGH (ref 11.4–15.2)

## 2022-04-02 LAB — TROPONIN I (HIGH SENSITIVITY)
Troponin I (High Sensitivity): 109 ng/L (ref <18)
Troponin I (High Sensitivity): 113 ng/L (ref <18)

## 2022-04-02 MED ORDER — OXYCODONE HCL 5 MG PO TABS
5.0000 mg | ORAL_TABLET | Freq: Four times a day (QID) | ORAL | Status: DC | PRN
Start: 2022-04-02 — End: 2022-04-02

## 2022-04-02 MED ORDER — OXYCODONE HCL 5 MG PO TABS
2.5000 mg | ORAL_TABLET | Freq: Four times a day (QID) | ORAL | Status: DC | PRN
  Administered 2022-04-05: 2.5 mg via ORAL
  Filled 2022-04-02: qty 1

## 2022-04-02 MED ORDER — FUROSEMIDE 10 MG/ML IJ SOLN
80.0000 mg | Freq: Once | INTRAMUSCULAR | Status: AC
Start: 2022-04-02 — End: 2022-04-02
  Administered 2022-04-02: 80 mg via INTRAVENOUS
  Filled 2022-04-02: qty 8

## 2022-04-02 NOTE — Progress Notes (Signed)
?[facility]  ?ROUNDING NOTE  ? ?Subjective:  ?Patient seen resting in bed, family at bedside ?Continues to adamantly refuse dialysis ?Tolerating meals ?Per chart review patient became hypoxic with consideration of BiPAP and escalation of care.  Patient was managed on unit.  Patient was made DNR. ?Currently on 8 L high flow ? ?Creatinine 5.52 ?UOP 400 mL recorded on day shift yesterday ? ? ?Objective:  ?Vital signs in last 24 hours:  ?Temp:  [97.4 ?F (36.3 ?C)-98 ?F (36.7 ?C)] 97.6 ?F (36.4 ?C) (04/05 0751) ?Pulse Rate:  [47-88] 47 (04/05 0751) ?Resp:  [16-23] 20 (04/05 0751) ?BP: (90-140)/(57-77) 140/68 (04/05 0751) ?SpO2:  [87 %-100 %] 100 % (04/05 0751) ?FiO2 (%):  [45 %] 45 % (04/04 2142) ?Weight:  [53.3 kg] 53.3 kg (04/05 0347) ? ?Weight change: -5.7 kg ?Filed Weights  ? 03/31/22 0415 04/01/22 0600 04/02/22 0347  ?Weight: 62.5 kg 59 kg 53.3 kg  ? ? ?Intake/Output: ?I/O last 3 completed shifts: ?In: 647.7 [P.O.:427; I.V.:120.7; IV Piggyback:100] ?Out: 1550 [[id number]] ?  ?Intake/Output this shift: ? No intake/output data recorded. ? ?Physical Exam: ?General: NAD, cachectic  ?Head: Normocephalic, atraumatic. Moist oral mucosal membranes  ?Eyes: Anicteric  ?Lungs:  Clear to auscultation, normal effort  ?Heart: Regular rate and rhythm  ?Abdomen:  Soft, nontender  ?Extremities:  2+ peripheral edema.  ?Neurologic: Nonfocal, moving all four extremities  ?Skin: No lesions  ?Access: None  ? ? ?Basic Metabolic Panel: ?Recent Labs  ?Lab 03/29/22 ?0422 03/30/22 ?4196 03/31/22 ?2229 04/01/22 ?0441 04/02/22 ?0226  ?NA 139 137 135 136 137  ?K 5.4* 4.8 4.2 3.9 4.0  ?CL 107 105 103 103 103  ?CO2 22 22 20* 22 21*  ?GLUCOSE 86 102* 96 91 98  ?BUN 64* 80* 95* 97* 112*  ?CREATININE 3.98* 4.42* 4.66* 5.25* 5.52*  ?CALCIUM 8.5* 7.8* 7.7* 7.6* 7.7*  ?MG  --  1.8  --  1.7  --   ?PHOS  --  5.2*  --  4.9*  --   ? ? ? ?Liver Function Tests: ?Recent Labs  ?Lab 03/27/22 ?1315 03/28/22 ?1330 04/01/22 ?0441  ?AST 21 229* 52*   ?ALT 9 152* 76*  ?ALKPHOS 150* 222* 126  ?BILITOT 0.6 1.1 0.7  ?PROT 6.6 6.8 5.0*  ?ALBUMIN 3.4* 2.7* 1.9*  ? ? ?Recent Labs  ?Lab 03/28/22 ?2333  ?LIPASE 21  ? ? ?No results for input(s): AMMONIA in the last 168 hours. ? ?CBC: ?Recent Labs  ?Lab 03/27/22 ?1315 03/28/22 ?1330 03/28/22 ?1330 03/28/22 ?2333 03/30/22 ?0434 03/31/22 ?7989 04/01/22 ?0441 04/02/22 ?0226  ?WBC 9.2 9.5   < > 10.4 9.9 9.9 9.8 9.1  ?NEUTROABS 7.7* 8.1*  --   --   --   --  7.7  --   ?HGB 9.7* 9.2*   < > 9.9* 8.4* 8.8* 7.4* 7.4*  ?HCT 29.5* 28.6*   < > 30.4* 25.4* 26.6* 22.5* 22.4*  ?MCV 93 95.7   < > 93.3 93.4 91.4 91.5 91.1  ?PLT 126* 131*   < > 135* 123* 130* 119* 116*  ? < > = values in this interval not displayed.  ? ? ? ?Cardiac Enzymes: ?No results for input(s): CKTOTAL, CKMB, CKMBINDEX, TROPONINI in the last 168 hours. ? ?BNP: ?Invalid input(s): POCBNP ? ?CBG: ?Recent Labs  ?Lab 04/01/22 ?1819  ?GLUCAP 134*  ? ? ?Microbiology: ?Results for orders placed or performed during the hospital encounter of 03/28/22  ?Culture, blood (routine x 2)     Status: None  ?  Collection Time: 03/28/22  1:30 PM  ? Specimen: BLOOD  ?Result Value Ref Range Status  ? Specimen Description BLOOD BLOOD LEFT ARM UPPER  Final  ? Special Requests   Final  ?  BOTTLES DRAWN AEROBIC AND ANAEROBIC Blood Culture adequate volume  ? Culture   Final  ?  NO GROWTH 5 DAYS ?Performed at [facility], [address], [place], [place] [address] ?  ? Report Status 04/02/2022 FINAL  Final  ?Respiratory (~20 pathogens) panel by PCR     Status: None  ? Collection Time: 03/28/22  1:31 PM  ? Specimen: Nasopharyngeal Swab; Respiratory  ?Result Value Ref Range Status  ? Adenovirus NOT DETECTED NOT DETECTED Final  ? Coronavirus 229E NOT DETECTED NOT DETECTED Final  ?  Comment: (NOTE) ?The Coronavirus on the Respiratory Panel, DOES NOT test for the novel  ?Coronavirus (2019 nCoV) ?  ? Coronavirus HKU1 NOT DETECTED NOT DETECTED Final  ? Coronavirus NL63 NOT DETECTED NOT  DETECTED Final  ? Coronavirus OC43 NOT DETECTED NOT DETECTED Final  ? Metapneumovirus NOT DETECTED NOT DETECTED Final  ? Rhinovirus / Enterovirus NOT DETECTED NOT DETECTED Final  ? Influenza A NOT DETECTED NOT DETECTED Final  ? Influenza B NOT DETECTED NOT DETECTED Final  ? Parainfluenza Virus 1 NOT DETECTED NOT DETECTED Final  ? Parainfluenza Virus 2 NOT DETECTED NOT DETECTED Final  ? Parainfluenza Virus 3 NOT DETECTED NOT DETECTED Final  ? Parainfluenza Virus 4 NOT DETECTED NOT DETECTED Final  ? Respiratory Syncytial Virus NOT DETECTED NOT DETECTED Final  ? Bordetella pertussis NOT DETECTED NOT DETECTED Final  ? Bordetella Parapertussis NOT DETECTED NOT DETECTED Final  ? Chlamydophila pneumoniae NOT DETECTED NOT DETECTED Final  ? Mycoplasma pneumoniae NOT DETECTED NOT DETECTED Final  ?  Comment: Performed at [facility], [place] [address], [place], [place] [address]  ?Resp Panel by RT-PCR (Flu A&B, Covid) Nasopharyngeal Swab     Status: None  ? Collection Time: 03/28/22  2:12 PM  ? Specimen: Nasopharyngeal Swab; Nasopharyngeal(NP) swabs in vial transport medium  ?Result Value Ref Range Status  ? SARS Coronavirus 2 by RT PCR NEGATIVE NEGATIVE Final  ?  Comment: (NOTE) ?SARS-CoV-2 target nucleic acids are NOT DETECTED. ? ?The SARS-CoV-2 RNA is generally detectable in upper respiratory ?specimens during the acute phase of infection. The lowest ?concentration of SARS-CoV-2 viral copies this assay can detect is ?138 copies/mL. A negative result does not preclude SARS-Cov-2 ?infection and should not be used as the sole basis for treatment or ?other patient management decisions. A negative result may occur with  ?improper specimen collection/handling, submission of specimen other ?than nasopharyngeal swab, presence of viral mutation(s) within the ?areas targeted by this assay, and inadequate number of viral ?copies(<138 copies/mL). A negative result must be combined with ?clinical observations, patient history, and  epidemiological ?information. The expected result is Negative. ? ?Fact Sheet for Patients:  ?EntrepreneurPulse.com.au ? ?Fact Sheet for Healthcare Providers:  ?IncredibleEmployment.be ? ?This test is no t yet approved or cleared by the [place] FDA and  ?has been authorized for detection and/or diagnosis of SARS-CoV-2 by ?FDA under an Emergency Use Authorization (EUA). This EUA will remain  ?in effect (meaning this test can be used) for the duration of the ?COVID-19 declaration under Section 564(b)(1) of the Act, 21 ?U.S.C.section 360bbb-3(b)(1), unless the authorization is terminated  ?or revoked sooner.  ? ? ?  ? Influenza A by PCR NEGATIVE NEGATIVE Final  ? Influenza B by PCR NEGATIVE  NEGATIVE Final  ?  Comment: (NOTE) ?The Xpert Xpress SARS-CoV-2/FLU/RSV plus assay is intended as an aid ?in the diagnosis of influenza from Nasopharyngeal swab specimens and ?should not be used as a sole basis for treatment. Nasal washings and ?aspirates are unacceptable for Xpert Xpress SARS-CoV-2/FLU/RSV ?testing. ? ?Fact Sheet for Patients: ?EntrepreneurPulse.com.au ? ?Fact Sheet for Healthcare Providers: ?IncredibleEmployment.be ? ?This test is not yet approved or cleared by the [place] FDA and ?has been authorized for detection and/or diagnosis of SARS-CoV-2 by ?FDA under an Emergency Use Authorization (EUA). This EUA will remain ?in effect (meaning this test can be used) for the duration of the ?COVID-19 declaration under Section 564(b)(1) of the Act, 21 U.S.C. ?section 360bbb-3(b)(1), unless the authorization is terminated or ?revoked. ? ?Performed at [facility], [place], ?[place] [address] ?  ?Culture, blood (routine x 2)     Status: None  ? Collection Time: 03/28/22  2:26 PM  ? Specimen: BLOOD LEFT ARM  ?Result Value Ref Range Status  ? Specimen Description BLOOD LEFT ARM  Final  ? Special Requests   Final  ?   BOTTLES DRAWN AEROBIC AND ANAEROBIC Blood Culture adequate volume  ? Culture   Final  ?  NO GROWTH 5 DAYS ?Performed at [facility], [address] [place], [place] [address] ?  ? Report Status 04/02/2022

## 2022-04-02 NOTE — Progress Notes (Signed)
PT Cancellation Note ? ?Patient Details ?Name: Glenn Ketcher Sr. ?MRN: [id number] ?DOB: May 21, 1927 ? ? ?Cancelled Treatment:    Reason Eval/Treat Not Completed: Other (comment). Per chart review, pt with respiratory decline over night, currently on HFNC and elevated troponin. Per MD discussion, will hold off on therapy at this time. Will continue to follow next date if medically appropriate. ? ? ?Glenn Jarvis ?04/02/2022, 10:33 AM ?Greggory Stallion, PT, DPT, GCS ?[phone] ? ?

## 2022-04-02 NOTE — Plan of Care (Signed)
?  Problem: Clinical Measurements: Goal: Respiratory complications will improve Outcome: Progressing   Problem: Clinical Measurements: Goal: Cardiovascular complication will be avoided Outcome: Progressing   Problem: Activity: Goal: Risk for activity intolerance will decrease Outcome: Progressing   Problem: Pain Managment: Goal: General experience of comfort will improve Outcome: Progressing   Problem: Safety: Goal: Ability to remain free from injury will improve Outcome: Progressing   

## 2022-04-02 NOTE — Progress Notes (Signed)
?Progress Note ? ? ?Patient: Glenn Lazo Sr. HCW:[id number] DOB: February 23, 1927 DOA: 03/28/2022     5 ?DOS: the patient was seen and examined on 04/02/2022 ?  ?Brief hospital course: ?Mr. Paulsen is a 88 y.o. M with CKD IV, HTN, hypothyroidism who prsented with 1 month swelling, and then 2 days severe dyspnea. ? ?Went to PCP, had CXR that showed bilateral opacities, was evidently prescribed furosemide and doxycycline, got worse and came to the ER. ? ?In the ER, required 4-6L to keep sats normal, was dyspneic.  CXR showed bilateral opacities.  BNP elevated. ? ?Admitted and started on IV Lasix.  Cr steadily worsened from 3 >> 5, and Nephrology were consulted. ? ?Assessment and Plan: ?* Acute on chronic combined systolic and diastolic CHF (congestive heart failure) ([place]) ?Cr trending up but marked worsening respiratory status and worsening infiltrates on CXR overnight, plus signs of heart strain and liver congestion. ? ?- Consult Nephrology, I agree now that this appears to be end stage heart failure, resuming Lasix may relieve congestion and symptoms at the expense of worsening renal function ?- Daily Cr ? ?-Continue Bidil and Coreg with hold parameters ?-Continue aspirin ? ? ? ? ?Acute renal failure superimposed on stage 4 chronic kidney disease ([place]) ?This appears to be end stage.  BUN >100, uremia is mild. ? ?Multifocal pneumonia ?-Continue Zosyn and doxycycline ? ?NSTEMI (non-ST elevated myocardial infarction) ([place]) ?Type 2 NSTEMI with worsening hypoxia and heart failure in the setting of coronary disease.  ?-Continue aspirin ?- Continue heparin gtt for 48 hours then stop ?- He is not a candidate for LHC or PCI ?- Consult Cardiology ? ?Thrombocytopenia ([place]) ?Platelets low stable ? ?Acute respiratory failure with hypoxia ([place]) ?Due to CHF and possibly pneumonia.   ? ?Worsened overnight last night, requiring BiPAP.  CXR worse.   ? ?Today able to wean down to 4L.  Suspect this is all progressive heart  failure ? ?Malnutrition of moderate degree ?  ? ?Anemia in chronic kidney disease (CKD) ?Hgb dropping down.  Poor prognostic sign.  Transfusion probably not safe. ? ?Adult hypothyroidism ?TSH elevated ?-Continue levothyroxine ? ?Benign hypertension ?BP controlled ?-Continue Coreg, Bidil ? ? ? ? ?  ? ?Subjective: Patient is more alert this morning, feels his breathing is better, he is eating breakfast feeling well.  No fever overnight, no sputum, no confusion. ? ?Physical Exam: ?Vitals:  ? 04/02/22 1128 04/02/22 1329 04/02/22 1435 04/02/22 1516  ?BP: 105/71  110/61 (!) 114/53  ?Pulse: 96  (!) 52 (!) 54  ?Resp: '20  18 16  '$ ?Temp: 98 ?F (36.7 ?C)  97.9 ?F (36.6 ?C) (!) 97.3 ?F (36.3 ?C)  ?TempSrc:    Oral  ?SpO2: 93% 98% 97% 97%  ?Weight:      ?Height:      ? ?Thin elderly male, lying in bed, nasal cannula in place, appears tired, but is alert and oriented, appropriate. ?RRR, I do not appreciate LV JVP, there is no peripheral edema ?Respiratory rate is slightly increased, he has some crackles at the bases ?Abdomen soft no tenderness palpation or guarding ? ? ? ? ? ?Data Reviewed: ?Discussed with nephrology, cardiology.  Nursing notes reviewed, vital signs reviewed. ?Creatinine up to 5.5 ?ABG last night is 7.49, PCO2 25, PO2 120 on nonrebreather ?Troponins trending slightly up to the 1 teens ?Hemoglobin 7.4, no change ? ?Family Communication: Son is at the bedside, called the daughter, no answer ? ?Disposition: ?Status is: Inpatient ?Patient is worsening heart failure  which is not responding to treatment.  He now likely has entered to terminal decline, and we are making arrangements for hospice. ? ?I suspect if we are able to relieve his symptoms, wean his oxygen down to 4 L or less, we can begin to make the transition to home with hospice in the next few days ? ? ? ? ? Planned Discharge Destination: To be determined ? ? ? ?  ? ?Author: ?Edwin Dada, MD ?04/02/2022 4:45 PM ? ?For on call review [email or web address].  ?

## 2022-04-02 NOTE — Progress Notes (Signed)
?   04/01/22 1942  ?Assess: MEWS Score  ?Temp 97.6 ?F (36.4 ?C)  ?BP 97/69  ?Pulse Rate 88  ?SpO2 100 %  ?O2 Device Non-rebreather Mask  ?Assess: MEWS Score  ?MEWS Temp 0  ?MEWS Systolic 1  ?MEWS Pulse 0  ?MEWS RR 1  ?MEWS LOC 0  ?MEWS Score 2  ?MEWS Score Color Yellow  ?Assess: if the MEWS score is Yellow or Red  ?Were vital signs taken at a resting state? Yes  ?Focused Assessment Change from prior assessment (see assessment flowsheet)  ?Does the patient meet 2 or more of the SIRS criteria? No  ?MEWS guidelines implemented *See Row Information* Yes  ?Treat  ?MEWS Interventions Other (Comment);Consulted Respiratory Therapy ?(See orders)  ?Pain Scale 0-10  ?Pain Score 0  ?Take Vital Signs  ?Increase Vital Sign Frequency  Yellow: Q 2hr X 2 then Q 4hr X 2, if remains yellow, continue Q 4hrs  ?Escalate  ?MEWS: Escalate Yellow: discuss with charge nurse/RN and consider discussing with provider and RRT  ?Notify: Charge Nurse/RN  ?Name of Charge Nurse/RN Notified Felicia  ?Date Charge Nurse/RN Notified 04/01/22  ?Time Charge Nurse/RN Notified 2000  ?Assess: SIRS CRITERIA  ?SIRS Temperature  0  ?SIRS Pulse 0  ?SIRS Respirations  1  ?SIRS WBC 0  ?SIRS Score Sum  1  ? ? ?

## 2022-04-02 NOTE — Progress Notes (Signed)
ANTICOAGULATION CONSULT NOTE ? ?Pharmacy Consult for heparin infusion ?Indication: ACS/STEMI ? ?Allergies  ?Allergen Reactions  ? Codeine   ? Cortizone-10  [Hydrocortisone]   ? ? ?Patient Measurements: ?Height: '6\' 1"'$  (185.4 cm) ?Weight: 53.3 kg (117 lb 8.1 oz) ?IBW/kg (Calculated) : 79.9 ?Heparin Dosing Weight: 59 kg ? ?Vital Signs: ?Temp: 97.6 ?F (36.4 ?C) (04/05 0751) ?Temp Source: Oral (04/05 0751) ?BP: 140/68 (04/05 0751) ?Pulse Rate: 47 (04/05 0751) ? ?Labs: ?Recent Labs  ?  03/31/22 ?1884 04/01/22 ?0441 04/01/22 ?1953 04/01/22 ?2138 04/02/22 ?0013 04/02/22 ?0226  ?HGB 8.8* 7.4*  --   --   --  7.4*  ?HCT 26.6* 22.5*  --   --   --  22.4*  ?PLT 130* 119*  --   --   --  116*  ?APTT  --   --   --   --   --  >200*  ?LABPROT  --   --   --   --   --  18.0*  ?INR  --   --   --   --   --  1.5*  ?HEPARINUNFRC  --   --   --   --   --  0.15*  ?CREATININE 4.66* 5.25*  --   --   --  5.52*  ?TROPONINIHS  --   --    < > 100* 109* 113*  ? < > = values in this interval not displayed.  ? ? ?Estimated Creatinine Clearance: 6.3 mL/min (A) (by C-G formula based on SCr of 5.52 mg/dL (H)). ? ?Medical History: ?Past Medical History:  ?Diagnosis Date  ? Arthritis   ? GERD (gastroesophageal reflux disease)   ? Hemorrhoid 1986  ? Hyperlipidemia   ? Hypertension   ? SVT (supraventricular tachycardia) ([place])   ? Thyroid disease   ? Vertigo   ? ? ?Medications:  ?Heparin 5000 units q8h, last dose 4/4 @ 1710 ? ?Assessment: ?Pt Is 88 yo male admitted 3/31 begin transitioned to heparin drip after recent labs showing slightly elevated troponin I, trending up. ? ?Goal of Therapy:  ?Heparin level 0.3-0.7 units/ml ?Monitor platelets by anticoagulation protocol: Yes ? ?Lab results ?Date/time HL/aPTT Rate  Comment ?0405'@0938'$  HL 0.35 700 units/hr Therapeutic x1 ?  ?Plan:  ?HL therapeutic x 1, will continue heparin drip at current rate of 700un/hr ?Dicussed aPTT > 200 baseline with MD, patient agreed may be due to liver congestion. Okay to continue using  HL for dose adjustment ?Repeat HL in 8 hours to confirm results ? ?Teressa Mcglocklin Rodriguez-Guzman PharmD, BCPS ?04/02/2022 10:40 AM ? ? ? ?

## 2022-04-02 NOTE — Progress Notes (Signed)
SUBJECTIVE: Glenn Jarvis. is a 88 y.o. male with medical history significant of hypothyroidism, Hx of SVT, Uncontrolled HTN, CKDIV, Gout, who has interim history of diagnosis of CAP after presenting to pcp 3/30 for respiratory symptoms.  ?  ?Denies chest pain. Shortness of breath and lower extremity edema improved ? ? ?Vitals:  ? 04/01/22 2245 04/02/22 0019 04/02/22 0347 04/02/22 0751  ?BP:  120/64 (!) 126/57 140/68  ?Pulse:  60 60 (!) 47  ?Resp:  '20 18 20  '$ ?Temp: 97.7 ?F (36.5 ?C) 97.6 ?F (36.4 ?C) 98 ?F (36.7 ?C) 97.6 ?F (36.4 ?C)  ?TempSrc: Axillary Axillary Oral Oral  ?SpO2:  98% 97% 100%  ?Weight:   53.3 kg   ?Height:      ? ? ?Intake/Output Summary (Last 24 hours) at 04/02/2022 0846 ?Last data filed at 04/02/2022 0607 ?Gross per 24 hour  ?Intake 313.07 ml  ?Output 400 ml  ?Net -86.93 ml  ? ? ?LABS: ?Basic Metabolic Panel: ?Recent Labs  ?  04/01/22 ?0441 04/02/22 ?0226  ?NA 136 137  ?K 3.9 4.0  ?CL 103 103  ?CO2 22 21*  ?GLUCOSE 91 98  ?BUN 97* 112*  ?CREATININE 5.25* 5.52*  ?CALCIUM 7.6* 7.7*  ?MG 1.7  --   ?PHOS 4.9*  --   ? ?Liver Function Tests: ?Recent Labs  ?  04/01/22 ?0441  ?AST 52*  ?ALT 76*  ?ALKPHOS 126  ?BILITOT 0.7  ?PROT 5.0*  ?ALBUMIN 1.9*  ? ?No results for input(s): LIPASE, AMYLASE in the last 72 hours. ?CBC: ?Recent Labs  ?  04/01/22 ?0441 04/02/22 ?0226  ?WBC 9.8 9.1  ?NEUTROABS 7.7  --   ?HGB 7.4* 7.4*  ?HCT 22.5* 22.4*  ?MCV 91.5 91.1  ?PLT 119* 116*  ? ?Cardiac Enzymes: ?No results for input(s): CKTOTAL, CKMB, CKMBINDEX, TROPONINI in the last 72 hours. ?BNP: ?Invalid input(s): POCBNP ?D-Dimer: ?No results for input(s): DDIMER in the last 72 hours. ?Hemoglobin A1C: ?No results for input(s): HGBA1C in the last 72 hours. ?Fasting Lipid Panel: ?No results for input(s): CHOL, HDL, LDLCALC, TRIG, CHOLHDL, LDLDIRECT in the last 72 hours. ?Thyroid Function Tests: ?No results for input(s): TSH, T4TOTAL, T3FREE, THYROIDAB in the last 72 hours. ? ?Invalid input(s): FREET3 ?Anemia Panel: ?No results  for input(s): VITAMINB12, FOLATE, FERRITIN, TIBC, IRON, RETICCTPCT in the last 72 hours. ? ? ?PHYSICAL EXAM ?General: no acute distress ?HEENT:  Normocephalic and atramatic ?Neck:  No JVD.  ?Lungs: Clear bilaterally to auscultation  ?Heart: HRRR . Normal S1 and S2 without gallops or murmurs.  ?Abdomen: Bowel sounds are positive, abdomen soft and non-tender  ?Extremities: No clubbing, cyanosis or edema.   ?Neuro: Alert and oriented X 3. ?Psych:  Good affect, responds appropriately ? ?TELEMETRY: NSR, HR 78 bpm ? ?ASSESSMENT AND PLAN: Patient resting comfortably in bed, eating his breakfast. Denies chest pain. Shortness of breath and lower extremity edema improved. Patient complains of generalized weakness, fatigue. ? ?Cr worsening. Patient refusing dialysis. ? ?Acute respiratory failure, CHF- treatment limited due to kidney function decreasing. ? ?Patient DNR. Being referred to hospice.   ? ?Principal Problem: ?  Acute on chronic combined systolic and diastolic CHF (congestive heart failure) ([place]) ?Active Problems: ?  Benign hypertension ?  Adult hypothyroidism ?  Anemia in chronic kidney disease (CKD) ?  Malnutrition of moderate degree ?  Multifocal pneumonia ?  Acute respiratory failure with hypoxia ([place]) ?  Acute renal failure superimposed on stage 4 chronic kidney disease ([place]) ?  Thrombocytopenia ([place]) ?  ? ?  [employer], FNP-C ?04/02/2022 ?8:46 AM ? ? ? ?    ?

## 2022-04-02 NOTE — Progress Notes (Signed)
?   04/02/22 2200  ?Clinical Encounter Type  ?Visited With Patient and family together  ?Visit Type Initial  ?Referral From Nurse  ?Consult/Referral To Chaplain  ? ?Chaplain responded to nurse consult. Chaplain provided compassionate presence and reflective listening as patient and daughter spoke about hospital stay. Patient and daughter appreciated [place] visit. ?

## 2022-04-02 NOTE — Progress Notes (Signed)
OT Cancellation Note ? ?Patient Details ?Name: Glenn Froh Sr. ?MRN: [id number] ?DOB: 30-Aug-1927 ? ? ?Cancelled Treatment:    Reason Eval/Treat Not Completed: Medical issues which prohibited therapy. Per chart review, pt with respiratory decline over night, currently on HFNC and elevated troponin. Per MD discussion, will hold off on therapy at this time. Will continue to follow next date if medically appropriate. ? ?Darleen Crocker, [place], OTR/L , CBIS ?ascom [phone]  ?04/02/22, 12:24 PM  ?

## 2022-04-02 NOTE — Plan of Care (Signed)
?  Problem: Clinical Measurements: Goal: Ability to maintain clinical measurements within normal limits will improve Outcome: Progressing   Problem: Clinical Measurements: Goal: Will remain free from infection Outcome: Progressing   Problem: Clinical Measurements: Goal: Respiratory complications will improve Outcome: Progressing   Problem: Clinical Measurements: Goal: Cardiovascular complication will be avoided Outcome: Progressing   Problem: Pain Managment: Goal: General experience of comfort will improve Outcome: Progressing   Problem: Safety: Goal: Ability to remain free from injury will improve Outcome: Progressing   

## 2022-04-02 NOTE — Progress Notes (Signed)
ANTICOAGULATION CONSULT NOTE ? ?Pharmacy Consult for heparin infusion ?Indication: ACS/STEMI ? ?Allergies  ?Allergen Reactions  ? Codeine   ? Cortizone-10  [Hydrocortisone]   ? ? ?Patient Measurements: ?Height: '6\' 1"'$  (185.4 cm) ?Weight: 53.3 kg (117 lb 8.1 oz) ?IBW/kg (Calculated) : 79.9 ?Heparin Dosing Weight: 59 kg ? ?Vital Signs: ?Temp: 97.3 ?F (36.3 ?C) (04/05 1516) ?Temp Source: Oral (04/05 1516) ?BP: 114/53 (04/05 1516) ?Pulse Rate: 54 (04/05 1516) ? ?Labs: ?Recent Labs  ?  03/31/22 ?4098 04/01/22 ?0441 04/01/22 ?1953 04/01/22 ?2138 04/02/22 ?0013 04/02/22 ?0226 04/02/22 ?1191 04/02/22 ?1703  ?HGB 8.8* 7.4*  --   --   --  7.4*  --   --   ?HCT 26.6* 22.5*  --   --   --  22.4*  --   --   ?PLT 130* 119*  --   --   --  116*  --   --   ?APTT  --   --   --   --   --  >200* >200*  --   ?LABPROT  --   --   --   --   --  18.0*  --   --   ?INR  --   --   --   --   --  1.5*  --   --   ?HEPARINUNFRC  --   --   --   --   --  0.15* 0.35 0.35  ?CREATININE 4.66* 5.25*  --   --   --  5.52*  --   --   ?TROPONINIHS  --   --    < > 100* 109* 113*  --   --   ? < > = values in this interval not displayed.  ? ? ?Estimated Creatinine Clearance: 6.3 mL/min (A) (by C-G formula based on SCr of 5.52 mg/dL (H)). ? ?Medical History: ?Past Medical History:  ?Diagnosis Date  ? Arthritis   ? GERD (gastroesophageal reflux disease)   ? Hemorrhoid 1986  ? Hyperlipidemia   ? Hypertension   ? SVT (supraventricular tachycardia) ([place])   ? Thyroid disease   ? Vertigo   ? ? ?Medications:  ?Heparin 5000 units q8h, last dose 4/4 @ 1710 ? ?Assessment: ?Pt Is 88 yo male admitted 3/31 begin transitioned to heparin drip after recent labs showing slightly elevated troponin I, trending up. ? ?Goal of Therapy:  ?Heparin level 0.3-0.7 units/ml ?Monitor platelets by anticoagulation protocol: Yes ? ?Lab results ?Date/time HL/aPTT Rate  Comment ?0405'@0938'$  HL 0.35 700 units/hr Therapeutic x1 ?0405'@1703'$  HL 0.35 Cont at 700 u/hr ?  ?Plan:  ?HL therapeutic x 2, will  continue heparin drip at current rate of 700un/hr ?F/u HL with am labs ? ?Chinita Greenland PharmD ?Clinical Pharmacist ?04/02/2022 ? ? ? ? ?

## 2022-04-02 NOTE — Assessment & Plan Note (Addendum)
Type 2 NSTEMI with worsening hypoxia and heart failure in the setting of coronary disease.  ?-Continue aspirin ?- Stop heparin ?- He is not a candidate for LHC or PCI ?- Consult Cardiology ?

## 2022-04-03 ENCOUNTER — Ambulatory Visit: Payer: Medicare PPO | Admitting: Physician Assistant

## 2022-04-03 DIAGNOSIS — I5043 Acute on chronic combined systolic (congestive) and diastolic (congestive) heart failure: Secondary | ICD-10-CM | POA: Diagnosis not present

## 2022-04-03 LAB — CBC
HCT: 24.5 % — ABNORMAL LOW (ref 39.0–52.0)
Hemoglobin: 8 g/dL — ABNORMAL LOW (ref 13.0–17.0)
MCH: 29.7 pg (ref 26.0–34.0)
MCHC: 32.7 g/dL (ref 30.0–36.0)
MCV: 91.1 fL (ref 80.0–100.0)
Platelets: 120 10*3/uL — ABNORMAL LOW (ref 150–400)
RBC: 2.69 MIL/uL — ABNORMAL LOW (ref 4.22–5.81)
RDW: 14.3 % (ref 11.5–15.5)
WBC: 10.2 10*3/uL (ref 4.0–10.5)
nRBC: 0 % (ref 0.0–0.2)

## 2022-04-03 LAB — BASIC METABOLIC PANEL
Anion gap: 15 (ref 5–15)
BUN: 112 mg/dL — ABNORMAL HIGH (ref 8–23)
CO2: 20 mmol/L — ABNORMAL LOW (ref 22–32)
Calcium: 8 mg/dL — ABNORMAL LOW (ref 8.9–10.3)
Chloride: 103 mmol/L (ref 98–111)
Creatinine, Ser: 5.71 mg/dL — ABNORMAL HIGH (ref 0.61–1.24)
GFR, Estimated: 9 mL/min — ABNORMAL LOW (ref >60)
Glucose, Bld: 90 mg/dL (ref 70–99)
Potassium: 3.8 mmol/L (ref 3.5–5.1)
Sodium: 138 mmol/L (ref 135–145)

## 2022-04-03 LAB — EXPECTORATED SPUTUM ASSESSMENT W GRAM STAIN, RFLX TO RESP C: Special Requests: NORMAL

## 2022-04-03 LAB — HEPARIN LEVEL (UNFRACTIONATED): Heparin Unfractionated: 0.36 IU/mL (ref 0.30–0.70)

## 2022-04-03 MED ORDER — FUROSEMIDE 10 MG/ML IJ SOLN
40.0000 mg | Freq: Once | INTRAMUSCULAR | Status: AC
  Administered 2022-04-03: 40 mg via INTRAVENOUS
  Filled 2022-04-03: qty 4

## 2022-04-03 MED ORDER — POLYETHYLENE GLYCOL 3350 17 G PO PACK
17.0000 g | PACK | Freq: Every day | ORAL | Status: DC
  Administered 2022-04-03 – 2022-04-04 (×2): 17 g via ORAL
  Filled 2022-04-03 (×3): qty 1

## 2022-04-03 NOTE — Plan of Care (Signed)

## 2022-04-03 NOTE — Progress Notes (Signed)
?[facility]  ?ROUNDING NOTE  ? ?Subjective:  ? ?Patient seen sitting up in bed ?Alert and oriented ?No family at bedside ?Improved respiratory status ? ?UOP 813m ? ?Objective:  ?Vital signs in last 24 hours:  ?Temp:  [97.3 ?F (36.3 ?C)-98 ?F (36.7 ?C)] 97.5 ?F (36.4 ?C) (04/06 1156) ?Pulse Rate:  [49-54] 51 (04/06 1156) ?Resp:  [15-18] 18 (04/06 1156) ?BP: (110-150)/(53-67) 143/63 (04/06 1156) ?SpO2:  [92 %-98 %] 93 % (04/06 1156) ?Weight:  [53 kg] 53 kg (04/06 0549) ? ?Weight change: -0.3 kg ?Filed Weights  ? 04/01/22 0600 04/02/22 0347 04/03/22 0549  ?Weight: 59 kg 53.3 kg 53 kg  ? ? ?Intake/Output: ?I/O last 3 completed shifts: ?In: 179.8 [P.O.:60; I.V.:69.8; IV Piggyback:50] ?Out: 800 [Urine:800] ?  ?Intake/Output this shift: ? Total I/O ?In: 282 [P.O.:240; I.V.:42] ?Out: 500 [Urine:500] ? ?Physical Exam: ?General: NAD, cachectic  ?Head: Normocephalic, atraumatic. Moist oral mucosal membranes  ?Eyes: Anicteric  ?Lungs:  Clear to auscultation, normal effort  ?Heart: Regular rate and rhythm  ?Abdomen:  Soft, nontender  ?Extremities:  2+ peripheral edema.  ?Neurologic: Nonfocal, moving all four extremities  ?Skin: No lesions  ?Access: None  ? ? ?Basic Metabolic Panel: ?Recent Labs  ?Lab 03/30/22 ?0434 03/31/22 ?0128704/04/23 ?0867604/05/23 ?0226 04/03/22 ?0401  ?NA 137 135 136 137 138  ?K 4.8 4.2 3.9 4.0 3.8  ?CL 105 103 103 103 103  ?CO2 22 20* 22 21* 20*  ?GLUCOSE 102* 96 91 98 90  ?BUN 80* 95* 97* 112* 112*  ?CREATININE 4.42* 4.66* 5.25* 5.52* 5.71*  ?CALCIUM 7.8* 7.7* 7.6* 7.7* 8.0*  ?MG 1.8  --  1.7  --   --   ?PHOS 5.2*  --  4.9*  --   --   ? ? ? ?Liver Function Tests: ?Recent Labs  ?Lab 03/27/22 ?1315 03/28/22 ?1330 04/01/22 ?0441  ?AST 21 229* 52*  ?ALT 9 152* 76*  ?ALKPHOS 150* 222* 126  ?BILITOT 0.6 1.1 0.7  ?PROT 6.6 6.8 5.0*  ?ALBUMIN 3.4* 2.7* 1.9*  ? ? ?Recent Labs  ?Lab 03/28/22 ?2333  ?LIPASE 21  ? ? ?No results for input(s): AMMONIA in the last 168 hours. ? ?CBC: ?Recent Labs  ?Lab  03/27/22 ?1315 03/28/22 ?1330 03/28/22 ?2333 03/30/22 ?0434 03/31/22 ?0720904/04/23 ?0470904/05/23 ?0226 04/03/22 ?0401  ?WBC 9.2 9.5   < > 9.9 9.9 9.8 9.1 10.2  ?NEUTROABS 7.7* 8.1*  --   --   --  7.7  --   --   ?HGB 9.7* 9.2*   < > 8.4* 8.8* 7.4* 7.4* 8.0*  ?HCT 29.5* 28.6*   < > 25.4* 26.6* 22.5* 22.4* 24.5*  ?MCV 93 95.7   < > 93.4 91.4 91.5 91.1 91.1  ?PLT 126* 131*   < > 123* 130* 119* 116* 120*  ? < > = values in this interval not displayed.  ? ? ? ?Cardiac Enzymes: ?No results for input(s): CKTOTAL, CKMB, CKMBINDEX, TROPONINI in the last 168 hours. ? ?BNP: ?Invalid input(s): POCBNP ? ?CBG: ?Recent Labs  ?Lab 04/01/22 ?1819  ?GLUCAP 134*  ? ? ? ?Microbiology: ?Results for orders placed or performed during the hospital encounter of 03/28/22  ?Culture, blood (routine x 2)     Status: None  ? Collection Time: 03/28/22  1:30 PM  ? Specimen: BLOOD  ?Result Value Ref Range Status  ? Specimen Description BLOOD BLOOD LEFT ARM UPPER  Final  ? Special Requests   Final  ?  BOTTLES DRAWN  AEROBIC AND ANAEROBIC Blood Culture adequate volume  ? Culture   Final  ?  NO GROWTH 5 DAYS ?Performed at [facility], [address], [place], [place] [address] ?  ? Report Status 04/02/2022 FINAL  Final  ?Respiratory (~20 pathogens) panel by PCR     Status: None  ? Collection Time: 03/28/22  1:31 PM  ? Specimen: Nasopharyngeal Swab; Respiratory  ?Result Value Ref Range Status  ? Adenovirus NOT DETECTED NOT DETECTED Final  ? Coronavirus 229E NOT DETECTED NOT DETECTED Final  ?  Comment: (NOTE) ?The Coronavirus on the Respiratory Panel, DOES NOT test for the novel  ?Coronavirus (2019 nCoV) ?  ? Coronavirus HKU1 NOT DETECTED NOT DETECTED Final  ? Coronavirus NL63 NOT DETECTED NOT DETECTED Final  ? Coronavirus OC43 NOT DETECTED NOT DETECTED Final  ? Metapneumovirus NOT DETECTED NOT DETECTED Final  ? Rhinovirus / Enterovirus NOT DETECTED NOT DETECTED Final  ? Influenza A NOT DETECTED NOT DETECTED Final  ? Influenza B NOT DETECTED  NOT DETECTED Final  ? Parainfluenza Virus 1 NOT DETECTED NOT DETECTED Final  ? Parainfluenza Virus 2 NOT DETECTED NOT DETECTED Final  ? Parainfluenza Virus 3 NOT DETECTED NOT DETECTED Final  ? Parainfluenza Virus 4 NOT DETECTED NOT DETECTED Final  ? Respiratory Syncytial Virus NOT DETECTED NOT DETECTED Final  ? Bordetella pertussis NOT DETECTED NOT DETECTED Final  ? Bordetella Parapertussis NOT DETECTED NOT DETECTED Final  ? Chlamydophila pneumoniae NOT DETECTED NOT DETECTED Final  ? Mycoplasma pneumoniae NOT DETECTED NOT DETECTED Final  ?  Comment: Performed at [facility], [place] [address], [place], [place] [address]  ?Resp Panel by RT-PCR (Flu A&B, Covid) Nasopharyngeal Swab     Status: None  ? Collection Time: 03/28/22  2:12 PM  ? Specimen: Nasopharyngeal Swab; Nasopharyngeal(NP) swabs in vial transport medium  ?Result Value Ref Range Status  ? SARS Coronavirus 2 by RT PCR NEGATIVE NEGATIVE Final  ?  Comment: (NOTE) ?SARS-CoV-2 target nucleic acids are NOT DETECTED. ? ?The SARS-CoV-2 RNA is generally detectable in upper respiratory ?specimens during the acute phase of infection. The lowest ?concentration of SARS-CoV-2 viral copies this assay can detect is ?138 copies/mL. A negative result does not preclude SARS-Cov-2 ?infection and should not be used as the sole basis for treatment or ?other patient management decisions. A negative result may occur with  ?improper specimen collection/handling, submission of specimen other ?than nasopharyngeal swab, presence of viral mutation(s) within the ?areas targeted by this assay, and inadequate number of viral ?copies(<138 copies/mL). A negative result must be combined with ?clinical observations, patient history, and epidemiological ?information. The expected result is Negative. ? ?Fact Sheet for Patients:  ?EntrepreneurPulse.com.au ? ?Fact Sheet for Healthcare Providers:  ?IncredibleEmployment.be ? ?This test is no t yet approved  or cleared by the [place] FDA and  ?has been authorized for detection and/or diagnosis of SARS-CoV-2 by ?FDA under an Emergency Use Authorization (EUA). This EUA will remain  ?in effect (meaning this test can be used) for the duration of the ?COVID-19 declaration under Section 564(b)(1) of the Act, 21 ?U.S.C.section 360bbb-3(b)(1), unless the authorization is terminated  ?or revoked sooner.  ? ? ?  ? Influenza A by PCR NEGATIVE NEGATIVE Final  ? Influenza B by PCR NEGATIVE NEGATIVE Final  ?  Comment: (NOTE) ?The Xpert Xpress SARS-CoV-2/FLU/RSV plus assay is intended as an aid ?in the diagnosis of influenza from Nasopharyngeal swab specimens and ?should not be used as a sole basis for treatment. Nasal  washings and ?aspirates are unacceptable for Xpert Xpress SARS-CoV-2/FLU/RSV ?testing. ? ?Fact Sheet for Patients: ?EntrepreneurPulse.com.au ? ?Fact Sheet for Healthcare Providers: ?IncredibleEmployment.be ? ?This test is not yet approved or cleared by the [place] FDA and ?has been authorized for detection and/or diagnosis of SARS-CoV-2 by ?FDA under an Emergency Use Authorization (EUA). This EUA will remain ?in effect (meaning this test can be used) for the duration of the ?COVID-19 declaration under Section 564(b)(1) of the Act, 21 U.S.C. ?section 360bbb-3(b)(1), unless the authorization is terminated or ?revoked. ? ?Performed at [facility], [place], ?[place] [address] ?  ?Culture, blood (routine x 2)     Status: None  ? Collection Time: 03/28/22  2:26 PM  ? Specimen: BLOOD LEFT ARM  ?Result Value Ref Range Status  ? Specimen Description BLOOD LEFT ARM  Final  ? Special Requests   Final  ?  BOTTLES DRAWN AEROBIC AND ANAEROBIC Blood Culture adequate volume  ? Culture   Final  ?  NO GROWTH 5 DAYS ?Performed at [facility], [address] [place], [place] [address] ?  ? Report Status 04/02/2022 FINAL  Final  ?Expectorated Sputum  Assessment w Gram Stain, Rflx to Resp Cult     Status: None  ? Collection Time: 04/03/22  9:42 AM  ? Specimen: Sputum  ?Result Value Ref Range Status  ? Specimen Description SPUTUM  Final  ? Special Requests

## 2022-04-03 NOTE — Progress Notes (Signed)
?Progress Note ? ? ?Patient: Glenn Dombek Sr. [id number] DOB: 09/10/1927 DOA: 03/28/2022     6 ?DOS: the patient was seen and examined on 04/03/2022 ?  ? ? ? ?Brief hospital course: ?Glenn Jarvis is a 88 y.o. M with CKD IV, HTN, hypothyroidism who prsented with 1 month swelling, and then 2 days severe dyspnea. ? ?Went to PCP, had CXR that showed bilateral opacities, was evidently prescribed furosemide and doxycycline, got worse and came to the ER. ? ?In the ER, required 4-6L to keep sats normal, was dyspneic.  CXR showed bilateral opacities.  BNP elevated. ? ?Admitted and started on IV Lasix.  Cr steadily worsened from 3 >> 5, and Nephrology were consulted. ? ?Patient failed to respond to antibiotics and diuresis and remained hypoxic with chest congestion despite aggressive therapy.  Given his progressive renal dysfunction and uremia and hypoxia and failure to respond to therapy, decision was made to refer to hospice for end of life care. ? ? ? ? ? ?Assessment and Plan: ?* Acute on chronic combined systolic and diastolic CHF (congestive heart failure) ([place]) ?- Okay to continue Lasix to decrease symptomatic congestions ?- Okay to continue Bidil, coreg, with hold parameters and aspirin  ? ? ?Acute renal failure superimposed on stage 4 chronic kidney disease ([place]) ?Cr continues to gradually worsen.  BUN >100, maybe some mild symptomatic uremia.    ? ? ?Multifocal pneumonia ?Completed 7 days antibiotics without improvement.  I do not think this is helping. ?- Stop antibiotics ? ? ?NSTEMI (non-ST elevated myocardial infarction) ([place]) ?Type 2 NSTEMI with worsening hypoxia and heart failure in the setting of coronary disease.  ?-Continue aspirin ?- Stop heparin ?- He is not a candidate for LHC or PCI ?- Consult Cardiology ? ? ? ?Adult hypothyroidism ?TSH elevated ?-Continue levothyroxine ? ?Benign hypertension ?BP controlled ?-Continue Coreg, Bidil ? ? ? ? ?  ? ?Subjective: The patient is weak and tired.  He complains of  shortness of breath but no chest pain, no swelling, no itching.  He seems somewhat sleepy and repetitive. ? ?Physical Exam: ?Vitals:  ? 04/03/22 0805 04/03/22 1156 04/03/22 1312 04/03/22 1620  ?BP: (!) 150/67 (!) 143/63  133/71  ?Pulse: (!) 49 (!) 51  (!) 54  ?Resp: '16 18  18  '$ ?Temp: 97.6 ?F (36.4 ?C) (!) 97.5 ?F (36.4 ?C)  97.7 ?F (36.5 ?C)  ?TempSrc: Oral Oral  Oral  ?SpO2: 95% 93% 93% 98%  ?Weight:      ?Height:      ? ?Frail elderly adult male, lying in bed, nasal cannula in place, alert and interactive and appropriate, oriented to hospital and son, but somewhat distracted and inattentive.  Respiratory rate normal, lung sounds diminished, rales bilaterally, heart rate normal, I do not appreciate JVP elevation or swelling of the legs.  Abdomen soft.  Generally cachectic.  Moves upper extremities with generalized weakness but symmetric strength, speech fluent, face symmetric ? ? ? ? ? ?Data Reviewed: ?Discussed with nephrology.  Nephrology notes reviewed, cardiology notes reviewed.  Vital signs reviewed. ?Creatinine up to 5.7, BUN 112, no change ?Hemoglobin 8, no change ? ?Family Communication: Son at the bedside, daughter by phone ? ?Disposition: ?Status is: Inpatient ?The patient has end-stage heart failure which has not responded to treatment. ? ?He has worsening renal function and appears to be entering a terminal decline due to cardiorenal failure. ? ?We are in discussion with hospice about the best place to provide end-of-life care with her residential  hospice at home.  We will do our best to care for him in the hospital until these arrangements can be set in place ? ? ? ? ? ? ? Planned Discharge Destination: To be determined ? ? ? ?Time spent:   minutes ? ?Author: ?Edwin Dada, MD ?04/03/2022 4:44 PM ? ?For on call review [email or web address].  ?

## 2022-04-03 NOTE — Progress Notes (Signed)
ANTICOAGULATION CONSULT NOTE ? ?Pharmacy Consult for heparin infusion ?Indication: ACS/STEMI ? ?Allergies  ?Allergen Reactions  ? Codeine   ? Cortizone-10  [Hydrocortisone]   ? ? ?Patient Measurements: ?Height: '6\' 1"'$  (185.4 cm) ?Weight: 53 kg (116 lb 13.5 oz) ?IBW/kg (Calculated) : 79.9 ?Heparin Dosing Weight: 59 kg ? ?Vital Signs: ?Temp: 97.7 ?F (36.5 ?C) (04/06 0434) ?BP: 144/67 (04/06 0434) ?Pulse Rate: 50 (04/06 0434) ? ?Labs: ?Recent Labs  ?  04/01/22 ?0441 04/01/22 ?0441 04/01/22 ?1953 04/01/22 ?2138 04/02/22 ?0013 04/02/22 ?0226 04/02/22 ?9150 04/02/22 ?1703 04/03/22 ?0401  ?HGB 7.4*  --   --   --   --  7.4*  --   --  8.0*  ?HCT 22.5*  --   --   --   --  22.4*  --   --  24.5*  ?PLT 119*  --   --   --   --  116*  --   --  120*  ?APTT  --   --   --   --   --  >200* >200*  --   --   ?LABPROT  --   --   --   --   --  18.0*  --   --   --   ?INR  --   --   --   --   --  1.5*  --   --   --   ?HEPARINUNFRC  --    < >  --   --   --  0.15* 0.35 0.35 0.36  ?CREATININE 5.25*  --   --   --   --  5.52*  --   --  5.71*  ?TROPONINIHS  --   --    < > 100* 109* 113*  --   --   --   ? < > = values in this interval not displayed.  ? ? ?Estimated Creatinine Clearance: 6.1 mL/min (A) (by C-G formula based on SCr of 5.71 mg/dL (H)). ? ?Medical History: ?Past Medical History:  ?Diagnosis Date  ? Arthritis   ? GERD (gastroesophageal reflux disease)   ? Hemorrhoid 1986  ? Hyperlipidemia   ? Hypertension   ? SVT (supraventricular tachycardia) ([place])   ? Thyroid disease   ? Vertigo   ? ? ?Medications:  ?Heparin 5000 units q8h, last dose 4/4 @ 1710 ? ?Assessment: ?Pt Is 88 yo male admitted 3/31 begin transitioned to heparin drip after recent labs showing slightly elevated troponin I, trending up. ? ?Goal of Therapy:  ?Heparin level 0.3-0.7 units/ml ?Monitor platelets by anticoagulation protocol: Yes ? ?Lab results ?Date/time HL/aPTT Rate  Comment ?0405'@0938'$  HL 0.35 700 units/hr Therapeutic x1 ?0405'@1703'$  HL 0.35 Cont at 700  u/hr ?0406'@0401'$  HL 0.36  ?  ?Plan:  ?HL therapeutic x 3, will continue heparin drip at current rate of 700un/hr ?F/u HL with am labs ? ?Renda Rolls, PharmD, MBA ?04/03/2022 ?6:45 AM ? ? ? ? ? ?

## 2022-04-03 NOTE — Progress Notes (Addendum)
Manufacturing engineer [facility]) Hospital Liaison Note ? ?Received request from [facility] [place] for family interest in Home w/ Hospice vs Hospice Home. MSW spoke with POA/Yolanda:[phone] to initiate education related to hospice philosophy, services, and team approach to care. Yolanda verbalized understanding of information given. Per discussion, family to have conversation with her brothers to confirm what their next steps will be moving forward. ?  ?TOC & MD aware. Family to follow up with MSW following family's discussion with MD. ? ?Addendum: 7:25p ?Yolanda contacted MSW reporting that she has decided to pursue [place] for EOL care.  ? ?Visited patient at bedside. Approval for Hospice Home is determined by [facility] MD. Once [facility] MD has determined Hospice Home eligibility, [place] will update hospital staff and family. Eligibility is pending ? ?Please do not hesitate to call with any hospice related questions.  ?  ?Thank you for the opportunity to participate in this patient's care. ?  ?Daphene Calamity, MSW ?[place]  ?[phone] ?

## 2022-04-04 DIAGNOSIS — E039 Hypothyroidism, unspecified: Secondary | ICD-10-CM

## 2022-04-04 DIAGNOSIS — N184 Chronic kidney disease, stage 4 (severe): Secondary | ICD-10-CM

## 2022-04-04 DIAGNOSIS — J9601 Acute respiratory failure with hypoxia: Secondary | ICD-10-CM

## 2022-04-04 DIAGNOSIS — N179 Acute kidney failure, unspecified: Secondary | ICD-10-CM

## 2022-04-04 MED ORDER — ALBUTEROL SULFATE (2.5 MG/3ML) 0.083% IN NEBU: 2.5000 mg | INHALATION_SOLUTION | RESPIRATORY_TRACT | Status: DC | PRN

## 2022-04-04 MED ORDER — TORSEMIDE 20 MG PO TABS
20.0000 mg | ORAL_TABLET | Freq: Every day | ORAL | Status: DC
  Administered 2022-04-04 – 2022-04-05 (×2): 20 mg via ORAL
  Filled 2022-04-04 (×2): qty 1

## 2022-04-04 NOTE — Progress Notes (Signed)
[facility]) Hospital Liaison Note ?  ?Patient has been approved for the Hospice Home. Unfortunately, Hospice Home is not able to offer a room today. MD and Ashley/TOC Manager aware hospital liaison will follow up tomorrow or sooner if a room becomes available.  ? ?POA/Yolanda:[phone] contacted to notify of above updates and no answer. VM left requesting call be returned.  ?  ?Please do not hesitate to call with any hospice related questions.  ?  ?Thank you for the opportunity to participate in this patient's care. ?  ?Daphene Calamity, MSW ?[place] ?[phone] ? ?

## 2022-04-04 NOTE — Progress Notes (Signed)
?  Progress Note ? ? ?Patient: Glenn Jarvis. EFE:[id number] DOB: [birth year] DOA: 03/28/2022     7 ?DOS: the patient was seen and examined on 04/04/2022 ?  ? ? ? ? ? ?Brief hospital course: ?Mr. Bress is a 88 y.o. M with CKD IV, HTN, hypothyroidism who prsented with 1 month swelling, and then 2 days severe dyspnea. ? ?Went to PCP, had CXR that showed bilateral opacities, was evidently prescribed furosemide and doxycycline, got worse and came to the ER. ? ?In the ER, required 4-6L to keep sats normal, was dyspneic.  CXR showed bilateral opacities.  BNP elevated. ? ?Admitted and started on IV Lasix.  Cr steadily worsened from 3 >> 5, and Nephrology were consulted. ? ?Patient failed to respond to antibiotics and diuresis and remained hypoxic with chest congestion despite aggressive therapy.  Given his progressive renal dysfunction and uremia and hypoxia and failure to respond to therapy, decision was made to refer to hospice for end of life care.  Reviewed by Hospice, will plan to transition to Hospice house when bed available. ? ? ? ? ?Assessment and Plan: ?* Acute on chronic combined systolic and diastolic CHF (congestive heart failure) ([place]) ?- Plan to continue diuresis with torsemide to reduce symptomatic congestion  ?- Okay to continue Bidil, coreg, with hold parameters and aspirin  ? ?Acute renal failure superimposed on stage 4 chronic kidney disease ([place]) ?Renal function appears to have entered end stage.  I agree with nephrology, the symptom relief from diuretic use is more important than preservation of kidney function.       ?- Continue torsemide ?- Consult Hospice ? ?Adult hypothyroidism ?-Continue levothyroxine ? ? ? ? ? ?  ? ?Subjective: Patient feeling well.  No fever, no cough, he is starting to feel a little better. ? ?Physical Exam: ?Vitals:  ? 04/04/22 1200 04/04/22 1204 04/04/22 1300 04/04/22 1411  ?BP:  139/71    ?Pulse:  (!) 52  (!) 52  ?Resp: 18 18 (!) 23 20  ?Temp:      ?TempSrc:      ?SpO2:  96%   95%  ?Weight:      ?Height:      ? ?Elderly adult male, very thin, sitting up in bed, nasal cannula in place, interactive with family members.  Crackles bilaterally, respiratory effort appears normal, no peripheral edema, RRR, soft systolic murmur, oriented to self and family, but somewhat inattentive. ? ? ? ? ? ? ?Data Reviewed: ?Nephrology notes reviewed, hospice notes reviewed, vital signs reviewed. ? ?Family Communication: Daughter at the bedside ? ?Disposition: ?Status is: Inpatient ?The patient was admitted with congestive heart failure, this is failed to respond to therapy and he appears to be entering a terminal decline resistance to therapy with advancing renal failure. ? ?At this point in time nephrology, cardiology, and I feel that his condition is terminal.  In an effort to relieve chest congestion we will continue diuresis, in an effort to reduce symptoms we will continue his levothyroxine and blood pressure medicines. ? ?He is appropriate for hospice when a bed is available, suspect that his renal failure will progress and he will get uremic within the next few days and pass away ? ? ? ? ? ? Planned Discharge Destination: Residential hospice ? ? ? ?Time spent:  minutes ? ?Author: ?Edwin Dada, MD ?04/04/2022 2:36 PM ? ?For on call review [email or web address]  ?

## 2022-04-04 NOTE — Progress Notes (Signed)
?   04/04/22 1200  ?Clinical Encounter Type  ?Visited With Family  ?Visit Type Follow-up  ?Referral From Nurse  ? ?Chaplain responded to Consult to assist with Advance Directive ?

## 2022-04-04 NOTE — Progress Notes (Signed)
SUBJECTIVE: Patient still very short of breath ? ? ?Vitals:  ? 04/04/22 1204 04/04/22 1300 04/04/22 1411 04/04/22 1624  ?BP: 139/71   103/90  ?Pulse: (!) 52  (!) 52 (!) 57  ?Resp: 18 (!) '23 20 18  '$ ?Temp:    (!) 97.5 ?F (36.4 ?C)  ?TempSrc:      ?SpO2: 96%  95% 98%  ?Weight:      ?Height:      ? ? ?Intake/Output Summary (Last 24 hours) at 04/04/2022 1718 ?Last data filed at 04/04/2022 0900 ?Gross per 24 hour  ?Intake 370.62 ml  ?Output 950 ml  ?Net -579.38 ml  ? ? ?LABS: ?Basic Metabolic Panel: ?Recent Labs  ?  04/02/22 ?0226 04/03/22 ?0401  ?NA 137 138  ?K 4.0 3.8  ?CL 103 103  ?CO2 21* 20*  ?GLUCOSE 98 90  ?BUN 112* 112*  ?CREATININE 5.52* 5.71*  ?CALCIUM 7.7* 8.0*  ? ?Liver Function Tests: ?No results for input(s): AST, ALT, ALKPHOS, BILITOT, PROT, ALBUMIN in the last 72 hours. ?No results for input(s): LIPASE, AMYLASE in the last 72 hours. ?CBC: ?Recent Labs  ?  04/02/22 ?0226 04/03/22 ?0401  ?WBC 9.1 10.2  ?HGB 7.4* 8.0*  ?HCT 22.4* 24.5*  ?MCV 91.1 91.1  ?PLT 116* 120*  ? ?Cardiac Enzymes: ?No results for input(s): CKTOTAL, CKMB, CKMBINDEX, TROPONINI in the last 72 hours. ?BNP: ?Invalid input(s): POCBNP ?D-Dimer: ?No results for input(s): DDIMER in the last 72 hours. ?Hemoglobin A1C: ?No results for input(s): HGBA1C in the last 72 hours. ?Fasting Lipid Panel: ?No results for input(s): CHOL, HDL, LDLCALC, TRIG, CHOLHDL, LDLDIRECT in the last 72 hours. ?Thyroid Function Tests: ?No results for input(s): TSH, T4TOTAL, T3FREE, THYROIDAB in the last 72 hours. ? ?Invalid input(s): FREET3 ?Anemia Panel: ?No results for input(s): VITAMINB12, FOLATE, FERRITIN, TIBC, IRON, RETICCTPCT in the last 72 hours. ? ? ?PHYSICAL EXAM ?General: Well developed, well nourished, in no acute distress ?HEENT:  Normocephalic and atramatic ?Neck:  No JVD.  ?Lungs: Clear bilaterally to auscultation and percussion. ?Heart: HRRR . Normal S1 and S2 without gallops or murmurs.  ?Abdomen: Bowel sounds are positive, abdomen soft and non-tender  ?Msk:   Back normal, normal gait. Normal strength and tone for age. ?Extremities: No clubbing, cyanosis or edema.   ?Neuro: Alert and oriented X 3. ?Psych:  Good affect, responds appropriately ? ?TELEMETRY: ? ?ASSESSMENT AND PLAN: Congestive heart failure with HFrEF and renal failure refusing dialysis and unable to place the patient on Entresto because of kidney function.  Advise making the patient hospice. ? ?Principal Problem: ?  Acute on chronic combined systolic and diastolic CHF (congestive heart failure) ([place]) ?Active Problems: ?  Benign hypertension ?  Adult hypothyroidism ?  Anemia in chronic kidney disease (CKD) ?  Malnutrition of moderate degree ?  Multifocal pneumonia ?  Acute respiratory failure with hypoxia ([place]) ?  Acute renal failure superimposed on stage 4 chronic kidney disease ([place]) ?  Thrombocytopenia ([place]) ?  NSTEMI (non-ST elevated myocardial infarction) ([place]) ?  ? ?Dionisio David, MD, FACC ?04/04/2022 ?5:18 PM ? ? ? ?At 2 ?

## 2022-04-04 NOTE — Plan of Care (Signed)
Consult noted for Glenn Jarvis. It appears patient and family have decided on comfort care at hospice facility, and patient is awaiting bed at hospice facility. Will sign off as goals are clear. Please reconsult if needed.  ?

## 2022-04-04 NOTE — Care Management Important Message (Signed)
Important Message ? ?Patient Details  ?Name: Glenn Lazenby Sr. ?MRN: [id number] ?Date of Birth: [birth year] ? ? ?Medicare Important Message Given:  Other (see comment) ? ?Disposition to transfer to hospice home pending bed availability.  Medicare IM withheld at this time out of respect for patient and family.  ? ? ?Dannette Barbara ?04/04/2022, 3:16 PM ?

## 2022-04-05 LAB — CULTURE, RESPIRATORY W GRAM STAIN: Special Requests: NORMAL

## 2022-04-05 MED ORDER — ONDANSETRON HCL 4 MG/2ML IJ SOLN: 4.0000 mg | Freq: Four times a day (QID) | INTRAMUSCULAR | 0 refills | Status: AC | PRN

## 2022-04-05 MED ORDER — POLYETHYLENE GLYCOL 3350 17 G PO PACK: 17.0000 g | PACK | Freq: Every day | ORAL | 0 refills | Status: AC

## 2022-04-05 MED ORDER — TORSEMIDE 20 MG PO TABS: 20.0000 mg | ORAL_TABLET | Freq: Every day | ORAL | Status: AC

## 2022-04-05 MED ORDER — IPRATROPIUM-ALBUTEROL 0.5-2.5 (3) MG/3ML IN SOLN: 3.0000 mL | Freq: Three times a day (TID) | RESPIRATORY_TRACT | Status: AC

## 2022-04-05 MED ORDER — OXYCODONE HCL 5 MG PO TABS: 2.5000 mg | ORAL_TABLET | Freq: Four times a day (QID) | ORAL | 0 refills | Status: AC | PRN

## 2022-04-05 MED ORDER — ACETAMINOPHEN 325 MG PO TABS: 650.0000 mg | ORAL_TABLET | ORAL | Status: AC | PRN

## 2022-04-05 MED ORDER — ALBUTEROL SULFATE (2.5 MG/3ML) 0.083% IN NEBU: 2.5000 mg | INHALATION_SOLUTION | RESPIRATORY_TRACT | 12 refills | Status: AC | PRN

## 2022-04-05 MED ORDER — CARVEDILOL 6.25 MG PO TABS: 6.2500 mg | ORAL_TABLET | Freq: Two times a day (BID) | ORAL | Status: AC

## 2022-04-05 MED ORDER — ISOSORB DINITRATE-HYDRALAZINE 20-37.5 MG PO TABS: 1.0000 | ORAL_TABLET | Freq: Three times a day (TID) | ORAL | Status: AC

## 2022-04-05 NOTE — Progress Notes (Addendum)
Manufacturing engineer (ACC) ? ?Hospice Home has a bed for Glenn Jarvis today. ? ?Spoke with daughter and POA, [name], she confirms desire to accept the bed on her fathers behalf. ? ?Once necessary consents are completed, ACC will update TOC manager and transport can be arranged. ? ?RN staff, please call report to [phone] ? ?Thank you, ?Venia Carbon BSN, RN ?[facility] Liaison ?

## 2022-04-05 NOTE — Progress Notes (Signed)
Report called to hospice facility to [facility]. IVs will remain in place per request of RN at facility. Will continue to monitor until transportation. ?

## 2022-04-05 NOTE — Discharge Summary (Signed)
?Physician Discharge Summary ?  ?Patient: Glenn Jarvis. MRN: [id number] DOB: 05-19-1927  ?Admit date:     03/28/2022  ?Discharge date: 04/05/22  ?Discharge Physician: Glenn Jarvis  ? ?PCP: Mikey Kirschner, PA-C  ? ?Recommendations at discharge:  ?Discharge to residential hospice for end of life care for progressive renal failure and heart failure ? ? ? ? ? ?Discharge Diagnoses: ?Principal Problem: ?  Acute on chronic combined systolic and diastolic CHF (congestive heart failure) ([place]) ?Active Problems: ?  Acute renal failure superimposed on stage 4 chronic kidney disease ([place]) ?  Multifocal pneumonia ?  Benign hypertension ?  Adult hypothyroidism ?  Anemia in chronic kidney disease (CKD) ?  Malnutrition of moderate degree ?  Acute respiratory failure with hypoxia ([place]) ?  Thrombocytopenia ([place]) ?  Suspected NSTEMI (non-ST elevated myocardial infarction) ([place]) ? ? ? ? ? ?Hospital Course: ?Glenn Jarvis is a 88 y.o. M with CKD IV, HTN, hypothyroidism who presented with 1 month progressive swelling, and then 2 days severe dyspnea. ? ?Went to PCP, had CXR that showed bilateral opacities, was evidently prescribed furosemide and doxycycline, got worse and came to the ER. ? ?In the ER, required 4-6L to keep sats normal, was dyspneic.  CXR showed bilateral opacities.  BNP elevated. ? ?Admitted and started on IV Lasix.  Cr steadily worsened, and Nephrology were consulted. ? ?Patient failed to respond clinically to antibiotics and diuresis and remained hypoxic and clinically congested despite aggressive therapy.  Given his progressive renal failure, uremia and heart failure and congestion despite appropriate disease therapy, referral to Hospice was made ? ? ? ? ? ? ? ? ?  ? ? ? ?DISCHARGE MEDICATION: ?Allergies as of 04/05/2022   ? ?   Reactions  ? Codeine   ? Cortizone-10  [hydrocortisone]   ? ?  ? ?  ?Medication List  ?  ? ?STOP taking these medications   ? ?allopurinol 100 MG tablet ?Commonly known as: ZYLOPRIM ?   ?amiodarone 200 MG tablet ?Commonly known as: PACERONE ?  ?amLODipine 5 MG tablet ?Commonly known as: NORVASC ?  ?colchicine-probenecid 0.5-500 MG tablet ?  ?doxycycline 100 MG tablet ?Commonly known as: VIBRA-TABS ?  ?ferrous sulfate 325 (65 FE) MG tablet ?  ?furosemide 40 MG tablet ?Commonly known as: LASIX ?  ?multivitamins with iron Tabs tablet ?  ?pantoprazole 40 MG tablet ?Commonly known as: PROTONIX ?  ?traZODone 50 MG tablet ?Commonly known as: DESYREL ?  ? ?  ? ?TAKE these medications   ? ?acetaminophen 325 MG tablet ?Commonly known as: TYLENOL ?Take 2 tablets (650 mg total) by mouth every 4 (four) hours as needed for headache or mild pain. ?  ?albuterol (2.5 MG/3ML) 0.083% nebulizer solution ?Commonly known as: PROVENTIL ?Take 3 mLs (2.5 mg total) by nebulization every 4 (four) hours as needed for wheezing or shortness of breath. ?  ?carvedilol 6.25 MG tablet ?Commonly known as: COREG ?Take 1 tablet (6.25 mg total) by mouth 2 (two) times daily with a meal. ?  ?ipratropium-albuterol 0.5-2.5 (3) MG/3ML Soln ?Commonly known as: DUONEB ?Take 3 mLs by nebulization 3 (three) times daily. ?  ?isosorbide-hydrALAZINE 20-37.5 MG tablet ?Commonly known as: BIDIL ?Take 1 tablet by mouth 3 (three) times daily. ?  ?levothyroxine 112 MCG tablet ?Commonly known as: SYNTHROID ?TAKE 1 TABLET BY MOUTH ONCE DAILY ON AN EMPTY STOMACH. WAIT 30 MINUTES BEFORE TAKING OTHER MEDS. ?  ?ondansetron 4 MG/2ML Soln injection ?Commonly known as: ZOFRAN ?Inject 2 mLs (4  mg total) into the vein every 6 (six) hours as needed for nausea. ?  ?oxyCODONE 5 MG immediate release tablet ?Commonly known as: Oxy IR/ROXICODONE ?Take 0.5 tablets (2.5 mg total) by mouth every 6 (six) hours as needed for breakthrough pain. ?  ?polyethylene glycol 17 g packet ?Commonly known as: MIRALAX / GLYCOLAX ?Take 17 g by mouth daily. ?  ?torsemide 20 MG tablet ?Commonly known as: DEMADEX ?Take 1 tablet (20 mg total) by mouth daily. ?  ? ?  ? ? ?Discharge  Exam: ?Filed Weights  ? 04/04/22 0025 04/04/22 0544 04/05/22 0500  ?Weight: 53.2 kg 52.3 kg 50.3 kg  ? ?General: Pt is sleeping, sleepily responds to voice, but does not open eyes.    ?Cardiovascular: RRR, nl S1-S2, no murmurs appreciated.   No LE edema.   ?Respiratory: Breaths shallow.  I hear rales bilaterally   ?Abdominal: Abdomen soft and non-tender.  No distension or HSM.   ?Neuro/Psych: Severe genearlized weakness, face symmetric, attention diminished   ? ? ? ?Condition at discharge: worsening ? ?The results of significant diagnostics from this hospitalization (including imaging, microbiology, ancillary and laboratory) are listed below for reference.  ? ?Imaging Studies: ?DG Chest 1 View ? ?Result Date: 03/28/2022 ?CLINICAL DATA:  Provided history: Shortness of breath. Recent diagnosis of pneumonia. EXAM: CHEST  1 VIEW COMPARISON:  Prior chest radiographs 03/27/2022 and earlier. FINDINGS: Heart size at the upper limits of normal. Aortic atherosclerosis. Progressed from yesterday's chest radiographs, there is extensive airspace disease throughout both lungs (right greater than left). No evidence of pleural effusion or pneumothorax. No acute bony abnormality identified. IMPRESSION: Extensive airspace disease throughout both lungs (right greater than left), progressed from yesterday's chest radiographs. Aortic Atherosclerosis (ICD10-I70.0). Electronically Signed   By: Kellie Simmering D.O.   On: 03/28/2022 14:41  ? ?DG Chest 2 View ? ?Result Date: 03/27/2022 ?CLINICAL DATA:  Shortness of breath hematemesis. EXAM: CHEST - 2 VIEW COMPARISON:  Chest radiograph dated November 23, 2017 FINDINGS: The heart size and mediastinal contours are within normal limits. There are bilateral lower lobe predominant ground-glass opacities, right worse than the left concerning for bilateral pneumonia. No acute osseous abnormality. IMPRESSION: Bilateral lower lobe ground-glass opacities, right worse than the left concerning for pneumonia.  Follow-up examination to resolution is recommended. Electronically Signed   By: Keane Police D.O.   On: 03/27/2022 12:30  ? ?US RENAL ? ?Result Date: 03/31/2022 ?CLINICAL DATA:  Acute kidney injury EXAM: RENAL / URINARY TRACT ULTRASOUND COMPLETE COMPARISON:  01/29/2015 FINDINGS: Right Kidney: Renal measurements: 8.8 by 3.7 by 3.5 cm = volume: 58 mL. Diffusely accentuated echogenicity compatible with chronic medical renal disease. No hydronephrosis. Left Kidney: Renal measurements: 8.1 by 4.7 by 3.3 cm = volume: 66 mL. Diffusely accentuated echogenicity compatible with chronic medical renal disease. Two adjacent cysts of the left kidney noted, 1 measuring 0.8 by 0.9 by 0.8 cm and the other measuring 0.8 by 0.8 by 0.9 cm. No hydronephrosis. Bladder: Appears normal for degree of bladder distention. Other: None. IMPRESSION: 1. Small diffusely echogenic kidneys favoring chronic medical renal disease. 2. Two small cysts of the left kidney observed. Electronically Signed   By: Van Clines M.D.   On: 03/31/2022 14:05  ? ?US Venous Img Lower Bilateral (DVT) ? ?Result Date: 03/31/2022 ?CLINICAL DATA:  Lower extremity edema. EXAM: BILATERAL LOWER EXTREMITY VENOUS DOPPLER ULTRASOUND TECHNIQUE: Gray-scale sonography with graded compression, as well as color Doppler and duplex ultrasound were performed to evaluate the lower extremity deep venous  systems from the level of the common femoral vein and including the common femoral, femoral, profunda femoral, popliteal and calf veins including the posterior tibial, peroneal and gastrocnemius veins when visible. The superficial great saphenous vein was also interrogated. Spectral Doppler was utilized to evaluate flow at rest and with distal augmentation maneuvers in the common femoral, femoral and popliteal veins. COMPARISON:  None. FINDINGS: RIGHT LOWER EXTREMITY Common Femoral Vein: No evidence of thrombus. Normal compressibility, respiratory phasicity and response to  augmentation. Saphenofemoral Junction: No evidence of thrombus. Normal compressibility and flow on color Doppler imaging. Profunda Femoral Vein: No evidence of thrombus. Normal compressibility and flow on color Doppler imag

## 2022-04-05 NOTE — Progress Notes (Signed)
SUBJECTIVE: Patient still short of breath. ? ? ?Vitals:  ? 04/05/22 0443 04/05/22 0500 04/05/22 0735 04/05/22 0840  ?BP: (!) 154/73   (!) 143/77  ?Pulse: (!) 57   (!) 58  ?Resp: 20   16  ?Temp: 97.9 ?F (36.6 ?C)   97.9 ?F (36.6 ?C)  ?TempSrc: Oral     ?SpO2: 95%  97% 96%  ?Weight:  50.3 kg    ?Height:      ? ? ?Intake/Output Summary (Last 24 hours) at 04/05/2022 0848 ?Last data filed at 04/04/2022 2100 ?Gross per 24 hour  ?Intake 360 ml  ?Output --  ?Net 360 ml  ? ? ?LABS: ?Basic Metabolic Panel: ?Recent Labs  ?  04/03/22 ?0401  ?NA 138  ?K 3.8  ?CL 103  ?CO2 20*  ?GLUCOSE 90  ?BUN 112*  ?CREATININE 5.71*  ?CALCIUM 8.0*  ? ?Liver Function Tests: ?No results for input(s): AST, ALT, ALKPHOS, BILITOT, PROT, ALBUMIN in the last 72 hours. ?No results for input(s): LIPASE, AMYLASE in the last 72 hours. ?CBC: ?Recent Labs  ?  04/03/22 ?0401  ?WBC 10.2  ?HGB 8.0*  ?HCT 24.5*  ?MCV 91.1  ?PLT 120*  ? ?Cardiac Enzymes: ?No results for input(s): CKTOTAL, CKMB, CKMBINDEX, TROPONINI in the last 72 hours. ?BNP: ?Invalid input(s): POCBNP ?D-Dimer: ?No results for input(s): DDIMER in the last 72 hours. ?Hemoglobin A1C: ?No results for input(s): HGBA1C in the last 72 hours. ?Fasting Lipid Panel: ?No results for input(s): CHOL, HDL, LDLCALC, TRIG, CHOLHDL, LDLDIRECT in the last 72 hours. ?Thyroid Function Tests: ?No results for input(s): TSH, T4TOTAL, T3FREE, THYROIDAB in the last 72 hours. ? ?Invalid input(s): FREET3 ?Anemia Panel: ?No results for input(s): VITAMINB12, FOLATE, FERRITIN, TIBC, IRON, RETICCTPCT in the last 72 hours. ? ? ?PHYSICAL EXAM ?General: Well developed, well nourished, in no acute distress ?HEENT:  Normocephalic and atramatic ?Neck:  No JVD.  ?Lungs: Clear bilaterally to auscultation and percussion. ?Heart: HRRR . Normal S1 and S2 without gallops or murmurs.  ?Abdomen: Bowel sounds are positive, abdomen soft and non-tender  ?Msk:  Back normal, normal gait. Normal strength and tone for age. ?Extremities: No  clubbing, cyanosis or edema.   ?Neuro: Alert and oriented X 3. ?Psych:  Good affect, responds appropriately ? ?TELEMETRY: Normal sinus rhythm ? ?ASSESSMENT AND PLAN: Congestive heart failure due to HFrEF with severe LV dysfunction and renal failure.  Patient is a DNR and unable to take Entresto due to renal failure and refusing dialysis.  Continue conservative treatment. ? ?Principal Problem: ?  Acute on chronic combined systolic and diastolic CHF (congestive heart failure) ([place]) ?Active Problems: ?  Benign hypertension ?  Adult hypothyroidism ?  Anemia in chronic kidney disease (CKD) ?  Malnutrition of moderate degree ?  Multifocal pneumonia ?  Acute respiratory failure with hypoxia ([place]) ?  Acute renal failure superimposed on stage 4 chronic kidney disease ([place]) ?  Thrombocytopenia ([place]) ?  NSTEMI (non-ST elevated myocardial infarction) ([place]) ?  ? ?Dionisio David, MD, FACC ?04/05/2022 ?8:48 AM ? ? ? ?  ?

## 2022-04-05 NOTE — TOC Transition Note (Signed)
Transition of Care (TOC) - CM/SW Discharge Note ? ? ?Patient Details  ?Name: Glenn Beegle Sr. ?MRN: [id number] ?Date of Birth: May 25, 1927 ? ?Transition of Care (TOC) CM/SW Contact:  ?Keaghan Bowens E Kida Digiulio, LCSW ?Phone Number: ?04/05/2022, 1:14 PM ? ? ?Clinical Narrative:    ?Discharge to [facility] in [place] today.  ?Family updated by [facility] Representative Glenn Jarvis. ?EMS paperwork completed and signed DNR in packet.  ?[facility] called, patient is next on the list as of 1:14 pm, care team updated.  ? ? ?Final next level of care: [place] ?Barriers to Discharge: Barriers Resolved ? ? ?Patient Goals and CMS Choice ?Patient states their goals for this hospitalization and ongoing recovery are:: to go home ?CMS Medicare.gov Compare Post Acute Care list provided to:: Patient Represenative (must comment) (POA daughter Glenn Jarvis) ?Choice offered to / list presented to : [facility] POA / Guardian ? ?Discharge Placement ?  ?           ?  ?Patient to be transferred to facility by: [facility] ?Name of family member notified: Family notified by Glenn Jarvis ?Patient and family notified of of transfer: 04/05/22 ? ?Discharge Plan and Services ?  ?  ?           ?  ?  ?  ?  ?  ?  ?  ?  ?  ?  ? ?Social Determinants of Health (SDOH) Interventions ?  ? ? ?Readmission Risk Interventions ?   ? View : No data to display.  ?  ?  ?  ? ? ? ? ? ?

## 2022-04-08 ENCOUNTER — Ambulatory Visit: Payer: Medicare PPO | Admitting: Family

## 2022-04-24 ENCOUNTER — Ambulatory Visit: Payer: Medicare PPO | Admitting: Cardiology

## 2022-04-28 DEATH — deceased

## 2022-08-19 ENCOUNTER — Encounter (INDEPENDENT_AMBULATORY_CARE_PROVIDER_SITE_OTHER): Payer: Medicare PPO

## 2022-08-19 ENCOUNTER — Ambulatory Visit (INDEPENDENT_AMBULATORY_CARE_PROVIDER_SITE_OTHER): Payer: Medicare PPO | Admitting: Vascular Surgery

## 2023-07-28 NOTE — Telephone Encounter (Signed)
error
